# Patient Record
Sex: Female | Born: 1935 | Race: White | Hispanic: No | Smoking: Never smoker
Health system: Southern US, Community
[De-identification: ages and names within clinical notes are randomized; demographics above are authoritative.]

## PROBLEM LIST (undated history)

## (undated) DIAGNOSIS — T847XXD Infection and inflammatory reaction due to other internal orthopedic prosthetic devices, implants and grafts, subsequent encounter: Secondary | ICD-10-CM

## (undated) DIAGNOSIS — E669 Obesity, unspecified: Secondary | ICD-10-CM

## (undated) DIAGNOSIS — I219 Acute myocardial infarction, unspecified: Secondary | ICD-10-CM

## (undated) DIAGNOSIS — R278 Other lack of coordination: Secondary | ICD-10-CM

## (undated) DIAGNOSIS — M1990 Unspecified osteoarthritis, unspecified site: Secondary | ICD-10-CM

## (undated) DIAGNOSIS — F329 Major depressive disorder, single episode, unspecified: Secondary | ICD-10-CM

## (undated) DIAGNOSIS — I10 Essential (primary) hypertension: Secondary | ICD-10-CM

## (undated) DIAGNOSIS — E785 Hyperlipidemia, unspecified: Secondary | ICD-10-CM

## (undated) DIAGNOSIS — D649 Anemia, unspecified: Secondary | ICD-10-CM

## (undated) DIAGNOSIS — M6281 Muscle weakness (generalized): Secondary | ICD-10-CM

## (undated) DIAGNOSIS — Z9289 Personal history of other medical treatment: Secondary | ICD-10-CM

## (undated) DIAGNOSIS — R262 Difficulty in walking, not elsewhere classified: Secondary | ICD-10-CM

## (undated) DIAGNOSIS — E639 Nutritional deficiency, unspecified: Secondary | ICD-10-CM

## (undated) DIAGNOSIS — Z9181 History of falling: Secondary | ICD-10-CM

## (undated) DIAGNOSIS — I872 Venous insufficiency (chronic) (peripheral): Secondary | ICD-10-CM

## (undated) DIAGNOSIS — F32A Depression, unspecified: Secondary | ICD-10-CM

## (undated) DIAGNOSIS — K219 Gastro-esophageal reflux disease without esophagitis: Secondary | ICD-10-CM

## (undated) DIAGNOSIS — E611 Iron deficiency: Secondary | ICD-10-CM

## (undated) DIAGNOSIS — L97323 Non-pressure chronic ulcer of left ankle with necrosis of muscle: Secondary | ICD-10-CM

## (undated) DIAGNOSIS — S82852A Displaced trimalleolar fracture of left lower leg, initial encounter for closed fracture: Secondary | ICD-10-CM

## (undated) DIAGNOSIS — I4891 Unspecified atrial fibrillation: Secondary | ICD-10-CM

## (undated) HISTORY — DX: Major depressive disorder, single episode, unspecified: F32.9

## (undated) HISTORY — DX: Gastro-esophageal reflux disease without esophagitis: K21.9

## (undated) HISTORY — DX: Hyperlipidemia, unspecified: E78.5

## (undated) HISTORY — DX: Obesity, unspecified: E66.9

## (undated) HISTORY — DX: Depression, unspecified: F32.A

## (undated) HISTORY — PX: APPENDECTOMY: SHX54

## (undated) HISTORY — DX: Essential (primary) hypertension: I10

---

## 2004-03-09 ENCOUNTER — Inpatient Hospital Stay (HOSPITAL_COMMUNITY): Admission: EM | Admit: 2004-03-09 | Discharge: 2004-03-15 | Payer: Self-pay | Admitting: Emergency Medicine

## 2004-03-15 ENCOUNTER — Inpatient Hospital Stay
Admission: RE | Admit: 2004-03-15 | Discharge: 2004-03-21 | Payer: Self-pay | Admitting: Physical Medicine & Rehabilitation

## 2004-06-11 ENCOUNTER — Ambulatory Visit (HOSPITAL_COMMUNITY): Admission: RE | Admit: 2004-06-11 | Discharge: 2004-06-11 | Payer: Self-pay | Admitting: Unknown Physician Specialty

## 2004-06-22 ENCOUNTER — Ambulatory Visit: Payer: Self-pay | Admitting: Internal Medicine

## 2004-07-03 ENCOUNTER — Ambulatory Visit: Payer: Self-pay | Admitting: Internal Medicine

## 2011-12-24 DIAGNOSIS — I10 Essential (primary) hypertension: Secondary | ICD-10-CM | POA: Diagnosis not present

## 2011-12-24 DIAGNOSIS — M949 Disorder of cartilage, unspecified: Secondary | ICD-10-CM | POA: Diagnosis not present

## 2011-12-24 DIAGNOSIS — E785 Hyperlipidemia, unspecified: Secondary | ICD-10-CM | POA: Diagnosis not present

## 2011-12-24 DIAGNOSIS — M899 Disorder of bone, unspecified: Secondary | ICD-10-CM | POA: Diagnosis not present

## 2011-12-24 DIAGNOSIS — F329 Major depressive disorder, single episode, unspecified: Secondary | ICD-10-CM | POA: Diagnosis not present

## 2012-01-20 DIAGNOSIS — H353190 Nonexudative age-related macular degeneration, unspecified eye, stage unspecified: Secondary | ICD-10-CM | POA: Diagnosis not present

## 2012-06-30 DIAGNOSIS — E785 Hyperlipidemia, unspecified: Secondary | ICD-10-CM | POA: Diagnosis not present

## 2012-06-30 DIAGNOSIS — Z1331 Encounter for screening for depression: Secondary | ICD-10-CM | POA: Diagnosis not present

## 2012-06-30 DIAGNOSIS — I7389 Other specified peripheral vascular diseases: Secondary | ICD-10-CM | POA: Diagnosis not present

## 2012-06-30 DIAGNOSIS — M899 Disorder of bone, unspecified: Secondary | ICD-10-CM | POA: Diagnosis not present

## 2012-06-30 DIAGNOSIS — M949 Disorder of cartilage, unspecified: Secondary | ICD-10-CM | POA: Diagnosis not present

## 2012-06-30 DIAGNOSIS — Z23 Encounter for immunization: Secondary | ICD-10-CM | POA: Diagnosis not present

## 2012-06-30 DIAGNOSIS — I10 Essential (primary) hypertension: Secondary | ICD-10-CM | POA: Diagnosis not present

## 2012-08-31 DIAGNOSIS — L2089 Other atopic dermatitis: Secondary | ICD-10-CM | POA: Diagnosis not present

## 2012-08-31 DIAGNOSIS — B379 Candidiasis, unspecified: Secondary | ICD-10-CM | POA: Diagnosis not present

## 2012-09-01 DIAGNOSIS — L538 Other specified erythematous conditions: Secondary | ICD-10-CM | POA: Diagnosis not present

## 2012-10-30 DIAGNOSIS — H01029 Squamous blepharitis unspecified eye, unspecified eyelid: Secondary | ICD-10-CM | POA: Diagnosis not present

## 2012-12-11 DIAGNOSIS — L538 Other specified erythematous conditions: Secondary | ICD-10-CM | POA: Diagnosis not present

## 2012-12-28 DIAGNOSIS — F331 Major depressive disorder, recurrent, moderate: Secondary | ICD-10-CM | POA: Diagnosis not present

## 2012-12-28 DIAGNOSIS — K219 Gastro-esophageal reflux disease without esophagitis: Secondary | ICD-10-CM | POA: Diagnosis not present

## 2012-12-28 DIAGNOSIS — D649 Anemia, unspecified: Secondary | ICD-10-CM | POA: Diagnosis not present

## 2012-12-28 DIAGNOSIS — M949 Disorder of cartilage, unspecified: Secondary | ICD-10-CM | POA: Diagnosis not present

## 2012-12-28 DIAGNOSIS — I7389 Other specified peripheral vascular diseases: Secondary | ICD-10-CM | POA: Diagnosis not present

## 2012-12-28 DIAGNOSIS — M899 Disorder of bone, unspecified: Secondary | ICD-10-CM | POA: Diagnosis not present

## 2012-12-28 DIAGNOSIS — E785 Hyperlipidemia, unspecified: Secondary | ICD-10-CM | POA: Diagnosis not present

## 2012-12-28 DIAGNOSIS — I10 Essential (primary) hypertension: Secondary | ICD-10-CM | POA: Diagnosis not present

## 2012-12-28 DIAGNOSIS — M549 Dorsalgia, unspecified: Secondary | ICD-10-CM | POA: Diagnosis not present

## 2013-01-25 DIAGNOSIS — H353190 Nonexudative age-related macular degeneration, unspecified eye, stage unspecified: Secondary | ICD-10-CM | POA: Diagnosis not present

## 2013-06-28 DIAGNOSIS — D649 Anemia, unspecified: Secondary | ICD-10-CM | POA: Diagnosis not present

## 2013-06-28 DIAGNOSIS — M549 Dorsalgia, unspecified: Secondary | ICD-10-CM | POA: Diagnosis not present

## 2013-06-28 DIAGNOSIS — M899 Disorder of bone, unspecified: Secondary | ICD-10-CM | POA: Diagnosis not present

## 2013-06-28 DIAGNOSIS — R809 Proteinuria, unspecified: Secondary | ICD-10-CM | POA: Diagnosis not present

## 2013-06-28 DIAGNOSIS — I10 Essential (primary) hypertension: Secondary | ICD-10-CM | POA: Diagnosis not present

## 2013-06-28 DIAGNOSIS — F331 Major depressive disorder, recurrent, moderate: Secondary | ICD-10-CM | POA: Diagnosis not present

## 2013-06-28 DIAGNOSIS — R82998 Other abnormal findings in urine: Secondary | ICD-10-CM | POA: Diagnosis not present

## 2013-06-28 DIAGNOSIS — Z79899 Other long term (current) drug therapy: Secondary | ICD-10-CM | POA: Diagnosis not present

## 2013-06-28 DIAGNOSIS — E785 Hyperlipidemia, unspecified: Secondary | ICD-10-CM | POA: Diagnosis not present

## 2013-06-28 DIAGNOSIS — I7389 Other specified peripheral vascular diseases: Secondary | ICD-10-CM | POA: Diagnosis not present

## 2013-06-28 DIAGNOSIS — R300 Dysuria: Secondary | ICD-10-CM | POA: Diagnosis not present

## 2013-06-28 DIAGNOSIS — Z23 Encounter for immunization: Secondary | ICD-10-CM | POA: Diagnosis not present

## 2013-09-06 DIAGNOSIS — H353190 Nonexudative age-related macular degeneration, unspecified eye, stage unspecified: Secondary | ICD-10-CM | POA: Diagnosis not present

## 2013-12-27 DIAGNOSIS — K219 Gastro-esophageal reflux disease without esophagitis: Secondary | ICD-10-CM | POA: Diagnosis not present

## 2013-12-27 DIAGNOSIS — D649 Anemia, unspecified: Secondary | ICD-10-CM | POA: Diagnosis not present

## 2013-12-27 DIAGNOSIS — M949 Disorder of cartilage, unspecified: Secondary | ICD-10-CM | POA: Diagnosis not present

## 2013-12-27 DIAGNOSIS — M549 Dorsalgia, unspecified: Secondary | ICD-10-CM | POA: Diagnosis not present

## 2013-12-27 DIAGNOSIS — E785 Hyperlipidemia, unspecified: Secondary | ICD-10-CM | POA: Diagnosis not present

## 2013-12-27 DIAGNOSIS — I7389 Other specified peripheral vascular diseases: Secondary | ICD-10-CM | POA: Diagnosis not present

## 2013-12-27 DIAGNOSIS — F331 Major depressive disorder, recurrent, moderate: Secondary | ICD-10-CM | POA: Diagnosis not present

## 2013-12-27 DIAGNOSIS — I10 Essential (primary) hypertension: Secondary | ICD-10-CM | POA: Diagnosis not present

## 2013-12-27 DIAGNOSIS — M899 Disorder of bone, unspecified: Secondary | ICD-10-CM | POA: Diagnosis not present

## 2014-06-12 ENCOUNTER — Emergency Department (HOSPITAL_COMMUNITY): Payer: Medicare Other

## 2014-06-12 ENCOUNTER — Encounter (HOSPITAL_COMMUNITY): Payer: Self-pay | Admitting: Emergency Medicine

## 2014-06-12 ENCOUNTER — Other Ambulatory Visit: Payer: Self-pay | Admitting: Physician Assistant

## 2014-06-12 ENCOUNTER — Inpatient Hospital Stay (HOSPITAL_COMMUNITY)
Admission: EM | Admit: 2014-06-12 | Discharge: 2014-06-16 | DRG: 493 | Disposition: A | Discharge status: Skilled Nursing Facility | Payer: Medicare Other | Attending: Orthopedic Surgery | Admitting: Orthopedic Surgery

## 2014-06-12 DIAGNOSIS — W19XXXA Unspecified fall, initial encounter: Secondary | ICD-10-CM

## 2014-06-12 DIAGNOSIS — M21271 Flexion deformity, right ankle and toes: Secondary | ICD-10-CM | POA: Diagnosis not present

## 2014-06-12 DIAGNOSIS — Z23 Encounter for immunization: Secondary | ICD-10-CM | POA: Diagnosis not present

## 2014-06-12 DIAGNOSIS — R262 Difficulty in walking, not elsewhere classified: Secondary | ICD-10-CM | POA: Diagnosis not present

## 2014-06-12 DIAGNOSIS — S82844D Nondisplaced bimalleolar fracture of right lower leg, subsequent encounter for closed fracture with routine healing: Secondary | ICD-10-CM | POA: Diagnosis not present

## 2014-06-12 DIAGNOSIS — Z9181 History of falling: Secondary | ICD-10-CM | POA: Diagnosis not present

## 2014-06-12 DIAGNOSIS — Z01818 Encounter for other preprocedural examination: Secondary | ICD-10-CM

## 2014-06-12 DIAGNOSIS — S8251XB Displaced fracture of medial malleolus of right tibia, initial encounter for open fracture type I or II: Secondary | ICD-10-CM | POA: Diagnosis not present

## 2014-06-12 DIAGNOSIS — S82891A Other fracture of right lower leg, initial encounter for closed fracture: Secondary | ICD-10-CM | POA: Diagnosis not present

## 2014-06-12 DIAGNOSIS — Z01812 Encounter for preprocedural laboratory examination: Secondary | ICD-10-CM | POA: Diagnosis not present

## 2014-06-12 DIAGNOSIS — J984 Other disorders of lung: Secondary | ICD-10-CM | POA: Diagnosis not present

## 2014-06-12 DIAGNOSIS — D62 Acute posthemorrhagic anemia: Secondary | ICD-10-CM | POA: Diagnosis not present

## 2014-06-12 DIAGNOSIS — N3000 Acute cystitis without hematuria: Secondary | ICD-10-CM | POA: Diagnosis not present

## 2014-06-12 DIAGNOSIS — M6281 Muscle weakness (generalized): Secondary | ICD-10-CM | POA: Diagnosis not present

## 2014-06-12 DIAGNOSIS — S82899A Other fracture of unspecified lower leg, initial encounter for closed fracture: Secondary | ICD-10-CM | POA: Diagnosis present

## 2014-06-12 DIAGNOSIS — S91001A Unspecified open wound, right ankle, initial encounter: Secondary | ICD-10-CM | POA: Diagnosis not present

## 2014-06-12 DIAGNOSIS — Z01810 Encounter for preprocedural cardiovascular examination: Secondary | ICD-10-CM | POA: Diagnosis not present

## 2014-06-12 DIAGNOSIS — M542 Cervicalgia: Secondary | ICD-10-CM | POA: Diagnosis not present

## 2014-06-12 DIAGNOSIS — S82841B Displaced bimalleolar fracture of right lower leg, initial encounter for open fracture type I or II: Principal | ICD-10-CM | POA: Diagnosis present

## 2014-06-12 DIAGNOSIS — Z79899 Other long term (current) drug therapy: Secondary | ICD-10-CM

## 2014-06-12 DIAGNOSIS — G8918 Other acute postprocedural pain: Secondary | ICD-10-CM | POA: Diagnosis not present

## 2014-06-12 DIAGNOSIS — Z7982 Long term (current) use of aspirin: Secondary | ICD-10-CM | POA: Diagnosis not present

## 2014-06-12 DIAGNOSIS — S8290XS Unspecified fracture of unspecified lower leg, sequela: Secondary | ICD-10-CM | POA: Diagnosis not present

## 2014-06-12 DIAGNOSIS — I10 Essential (primary) hypertension: Secondary | ICD-10-CM | POA: Diagnosis not present

## 2014-06-12 DIAGNOSIS — T148 Other injury of unspecified body region: Secondary | ICD-10-CM | POA: Diagnosis not present

## 2014-06-12 DIAGNOSIS — S46001A Unspecified injury of muscle(s) and tendon(s) of the rotator cuff of right shoulder, initial encounter: Secondary | ICD-10-CM | POA: Diagnosis not present

## 2014-06-12 DIAGNOSIS — J188 Other pneumonia, unspecified organism: Secondary | ICD-10-CM | POA: Diagnosis not present

## 2014-06-12 DIAGNOSIS — W1830XA Fall on same level, unspecified, initial encounter: Secondary | ICD-10-CM | POA: Diagnosis present

## 2014-06-12 DIAGNOSIS — R9431 Abnormal electrocardiogram [ECG] [EKG]: Secondary | ICD-10-CM | POA: Diagnosis not present

## 2014-06-12 DIAGNOSIS — R278 Other lack of coordination: Secondary | ICD-10-CM | POA: Diagnosis not present

## 2014-06-12 DIAGNOSIS — N390 Urinary tract infection, site not specified: Secondary | ICD-10-CM | POA: Diagnosis not present

## 2014-06-12 DIAGNOSIS — S82841A Displaced bimalleolar fracture of right lower leg, initial encounter for closed fracture: Secondary | ICD-10-CM | POA: Diagnosis not present

## 2014-06-12 LAB — CBC WITH DIFFERENTIAL/PLATELET
Basophils Absolute: 0 10*3/uL (ref 0.0–0.1)
Basophils Absolute: 0 10*3/uL (ref 0.0–0.1)
Basophils Relative: 0 % (ref 0–1)
Basophils Relative: 0 % (ref 0–1)
Eosinophils Absolute: 0 10*3/uL (ref 0.0–0.7)
Eosinophils Absolute: 0 10*3/uL (ref 0.0–0.7)
Eosinophils Relative: 0 % (ref 0–5)
Eosinophils Relative: 0 % (ref 0–5)
HCT: 35.4 % — ABNORMAL LOW (ref 36.0–46.0)
HCT: 30.5 % — ABNORMAL LOW (ref 36.0–46.0)
Hemoglobin: 11.5 g/dL — ABNORMAL LOW (ref 12.0–15.0)
Hemoglobin: 10.1 g/dL — ABNORMAL LOW (ref 12.0–15.0)
Lymphocytes Relative: 26 % (ref 12–46)
Lymphocytes Relative: 35 % (ref 12–46)
Lymphs Abs: 4.8 10*3/uL — ABNORMAL HIGH (ref 0.7–4.0)
Lymphs Abs: 4.3 10*3/uL — ABNORMAL HIGH (ref 0.7–4.0)
MCH: 30.9 pg (ref 26.0–34.0)
MCH: 30.8 pg (ref 26.0–34.0)
MCHC: 32.5 g/dL (ref 30.0–36.0)
MCHC: 33.1 g/dL (ref 30.0–36.0)
MCV: 95.2 fL (ref 78.0–100.0)
MCV: 93 fL (ref 78.0–100.0)
Monocytes Absolute: 0.6 10*3/uL (ref 0.1–1.0)
Monocytes Absolute: 0.9 10*3/uL (ref 0.1–1.0)
Monocytes Relative: 4 % (ref 3–12)
Monocytes Relative: 7 % (ref 3–12)
Neutro Abs: 12.6 10*3/uL — ABNORMAL HIGH (ref 1.7–7.7)
Neutro Abs: 7 10*3/uL (ref 1.7–7.7)
Neutrophils Relative %: 70 % (ref 43–77)
Neutrophils Relative %: 58 % (ref 43–77)
Platelets: 217 10*3/uL (ref 150–400)
Platelets: 207 10*3/uL (ref 150–400)
RBC: 3.72 MIL/uL — ABNORMAL LOW (ref 3.87–5.11)
RBC: 3.28 MIL/uL — ABNORMAL LOW (ref 3.87–5.11)
RDW: 13.1 % (ref 11.5–15.5)
RDW: 13.3 % (ref 11.5–15.5)
WBC: 18 10*3/uL — ABNORMAL HIGH (ref 4.0–10.5)
WBC: 12.2 10*3/uL — ABNORMAL HIGH (ref 4.0–10.5)

## 2014-06-12 LAB — COMPREHENSIVE METABOLIC PANEL
ALT: 15 U/L (ref 0–35)
AST: 31 U/L (ref 0–37)
Albumin: 3.4 g/dL — ABNORMAL LOW (ref 3.5–5.2)
Alkaline Phosphatase: 72 U/L (ref 39–117)
Anion gap: 13 (ref 5–15)
BUN: 30 mg/dL — ABNORMAL HIGH (ref 6–23)
CO2: 24 mEq/L (ref 19–32)
Calcium: 8.8 mg/dL (ref 8.4–10.5)
Chloride: 102 mEq/L (ref 96–112)
Creatinine, Ser: 1.01 mg/dL (ref 0.50–1.10)
GFR calc Af Amer: 57 mL/min — ABNORMAL LOW (ref >90)
GFR calc non Af Amer: 49 mL/min — ABNORMAL LOW (ref >90)
Glucose, Bld: 128 mg/dL — ABNORMAL HIGH (ref 70–99)
Potassium: 4.2 mEq/L (ref 3.7–5.3)
Sodium: 139 mEq/L (ref 137–147)
Total Bilirubin: 0.3 mg/dL (ref 0.3–1.2)
Total Protein: 6.3 g/dL (ref 6.0–8.3)

## 2014-06-12 LAB — TYPE AND SCREEN
ABO/RH(D): B POS
Antibody Screen: NEGATIVE

## 2014-06-12 LAB — BASIC METABOLIC PANEL
Anion gap: 15 (ref 5–15)
BUN: 35 mg/dL — ABNORMAL HIGH (ref 6–23)
CO2: 23 mEq/L (ref 19–32)
Calcium: 9.5 mg/dL (ref 8.4–10.5)
Chloride: 102 mEq/L (ref 96–112)
Creatinine, Ser: 1.09 mg/dL (ref 0.50–1.10)
GFR calc Af Amer: 52 mL/min — ABNORMAL LOW (ref >90)
GFR calc non Af Amer: 45 mL/min — ABNORMAL LOW (ref >90)
Glucose, Bld: 142 mg/dL — ABNORMAL HIGH (ref 70–99)
Potassium: 4.2 mEq/L (ref 3.7–5.3)
Sodium: 140 mEq/L (ref 137–147)

## 2014-06-12 LAB — PROTIME-INR
INR: 0.99 (ref 0.00–1.49)
INR: 1.11 (ref 0.00–1.49)
Prothrombin Time: 13.1 seconds (ref 11.6–15.2)
Prothrombin Time: 14.4 seconds (ref 11.6–15.2)

## 2014-06-12 LAB — APTT: aPTT: 27 seconds (ref 24–37)

## 2014-06-12 MED ORDER — ONDANSETRON HCL 4 MG/2ML IJ SOLN
4.0000 mg | Freq: Four times a day (QID) | INTRAMUSCULAR | Status: DC | PRN
  Administered 2014-06-12: 4 mg via INTRAVENOUS
  Filled 2014-06-12: qty 2

## 2014-06-12 MED ORDER — CEFAZOLIN SODIUM 1-5 GM-% IV SOLN
1.0000 g | Freq: Three times a day (TID) | INTRAVENOUS | Status: DC
Start: 2014-06-12 — End: 2014-06-13
  Administered 2014-06-12 – 2014-06-13 (×2): 1 g via INTRAVENOUS
  Filled 2014-06-12 (×5): qty 50

## 2014-06-12 MED ORDER — METOCLOPRAMIDE HCL 10 MG PO TABS
10.0000 mg | ORAL_TABLET | Freq: Four times a day (QID) | ORAL | Status: DC | PRN
Start: 2014-06-12 — End: 2014-06-13

## 2014-06-12 MED ORDER — METOCLOPRAMIDE HCL 10 MG PO TABS
10.0000 mg | ORAL_TABLET | Freq: Four times a day (QID) | ORAL | Status: DC | PRN
Start: 2014-06-12 — End: 2014-06-12

## 2014-06-12 MED ORDER — CEFAZOLIN SODIUM 1-5 GM-% IV SOLN
1.0000 g | Freq: Once | INTRAVENOUS | Status: AC
  Administered 2014-06-12: 1 g via INTRAVENOUS
  Filled 2014-06-12: qty 50

## 2014-06-12 MED ORDER — HYDROCODONE-ACETAMINOPHEN 5-325 MG PO TABS
1.0000 | ORAL_TABLET | ORAL | Status: DC | PRN
  Administered 2014-06-12: 2 via ORAL
  Administered 2014-06-13: 1 via ORAL
  Filled 2014-06-12: qty 1
  Filled 2014-06-12: qty 2

## 2014-06-12 MED ORDER — PROPOFOL 10 MG/ML IV BOLUS
0.5000 mg/kg | Freq: Once | INTRAVENOUS | Status: AC
  Administered 2014-06-12: 60 mg via INTRAVENOUS
  Filled 2014-06-12: qty 1

## 2014-06-12 MED ORDER — SODIUM CHLORIDE 0.9 % IV SOLN: INTRAVENOUS | Status: DC

## 2014-06-12 MED ORDER — HYDROMORPHONE HCL 1 MG/ML IJ SOLN
1.0000 mg | INTRAMUSCULAR | Status: DC | PRN
  Administered 2014-06-12 – 2014-06-13 (×2): 1 mg via INTRAVENOUS
  Filled 2014-06-12 (×2): qty 1

## 2014-06-12 MED ORDER — CHLORHEXIDINE GLUCONATE 4 % EX LIQD
60.0000 mL | Freq: Once | CUTANEOUS | Status: AC
  Filled 2014-06-12: qty 60

## 2014-06-12 MED ORDER — CHLORHEXIDINE GLUCONATE 4 % EX LIQD
60.0000 mL | Freq: Once | CUTANEOUS | Status: AC
  Administered 2014-06-13: 4 via TOPICAL
  Filled 2014-06-12: qty 60

## 2014-06-12 MED ORDER — METOCLOPRAMIDE HCL 10 MG PO TABS: 10.0000 mg | ORAL_TABLET | Freq: Four times a day (QID) | ORAL | Status: DC | PRN

## 2014-06-12 MED ORDER — CEFAZOLIN SODIUM-DEXTROSE 2-3 GM-% IV SOLR
2.0000 g | INTRAVENOUS | Status: AC
  Administered 2014-06-13: 2 g via INTRAVENOUS
  Filled 2014-06-12: qty 50

## 2014-06-12 MED ORDER — TETANUS-DIPHTH-ACELL PERTUSSIS 5-2.5-18.5 LF-MCG/0.5 IM SUSP
0.5000 mL | Freq: Once | INTRAMUSCULAR | Status: AC
  Administered 2014-06-12: 0.5 mL via INTRAMUSCULAR
  Filled 2014-06-12: qty 0.5

## 2014-06-12 MED ORDER — METOCLOPRAMIDE HCL 5 MG/ML IJ SOLN: 10.0000 mg | Freq: Four times a day (QID) | INTRAMUSCULAR | Status: DC

## 2014-06-12 MED ORDER — ONDANSETRON HCL 4 MG PO TABS: 4.0000 mg | ORAL_TABLET | Freq: Four times a day (QID) | ORAL | Status: DC | PRN

## 2014-06-12 MED ORDER — LACTATED RINGERS IV SOLN
INTRAVENOUS | Status: DC
  Administered 2014-06-12 – 2014-06-13 (×3): via INTRAVENOUS

## 2014-06-12 NOTE — ED Provider Notes (Signed)
CSN: [id number]     Arrival date & time 06/12/14  1133 History   First MD Initiated Contact with Patient 06/12/14 1206     Chief Complaint  Patient presents with  . Leg Injury    R ankle fracture     (Consider location/radiation/quality/duration/timing/severity/associated sxs/prior Treatment) HPI Comments: Patient presents to the ER for evaluation of right ankle injury. Patient reports that she stood up too fast to try to go to the bathroom, bumped into the wall and fell. She reports that she fell into a seated position with her leg bent under her. Patient complaining of right ankle pain and injury. She is brought to the ER by EMS who report obvious deformity of the right ankle.patient has been administered analgesia by EMS, reports only mild pain currently. Patient denies hitting her head. There was no loss of consciousness. Patient reports that she did bump her right arm on the wall and has a bruise there, but minimal pain. She denies chest pain, shortness of breath. No hip pain.   History reviewed. No pertinent past medical history. Past Surgical History  Procedure Laterality Date  . Appendectomy     No family history on file. History  Substance Use Topics  . Smoking status: Never Smoker   . Smokeless tobacco: Not on file  . Alcohol Use: No   OB History    No data available     Review of Systems  Musculoskeletal: Positive for arthralgias (right ankle). Negative for back pain and neck pain.  Neurological: Negative for syncope and headaches.  All other systems reviewed and are negative.     Allergies  Review of patient's allergies indicates no known allergies.  Home Medications   Prior to Admission medications   Medication Sig Start Date End Date Taking? Authorizing Provider  amLODipine (NORVASC) 5 MG tablet Take 5 mg by mouth daily.   Yes Historical Provider, MD  atenolol (TENORMIN) 50 MG tablet Take 50 mg by mouth daily.   Yes Historical Provider, MD  citalopram  (CELEXA) 20 MG tablet Take 20 mg by mouth daily.   Yes Historical Provider, MD  esomeprazole (NEXIUM) 40 MG capsule Take 40 mg by mouth daily at 12 noon.   Yes Historical Provider, MD  ezetimibe-simvastatin (VYTORIN) 10-40 MG per tablet Take 1 tablet by mouth at bedtime.   Yes Historical Provider, MD  losartan-hydrochlorothiazide (HYZAAR) 100-25 MG per tablet Take 1 tablet by mouth daily.   Yes Historical Provider, MD  aspirin EC 325 MG tablet Take 1 tablet (325 mg total) by mouth daily. 06/13/14   M. Odelia Gage, PA-C  bisacodyl (DULCOLAX) 5 MG EC tablet Take 1 tablet (5 mg total) by mouth daily as needed for moderate constipation. 06/13/14   M. Odelia Gage, PA-C  HYDROcodone-acetaminophen (NORCO) 7.5-325 MG per tablet Take 1-2 tablets by mouth every 4 (four) hours as needed for moderate pain. 06/13/14   M. Odelia Gage, PA-C  ondansetron (ZOFRAN) 4 MG tablet Take 1 tablet (4 mg total) by mouth every 8 (eight) hours as needed for nausea or vomiting. 06/13/14   M. Odelia Gage, PA-C   BP 152/58 mmHg  Pulse 68  Temp(Src) 97.9 F (36.6 C) (Oral)  Resp 13  Ht 5\' 6"  (1.676 m)  Wt 222 lb 3.6 oz (100.8 kg)  BMI 35.88 kg/m2  SpO2 93% Physical Exam  Constitutional: She is oriented to person, place, and time. She appears well-developed and well-nourished. No distress.  HENT:  Head: Normocephalic and atraumatic.  Right  Ear: Hearing normal.  Left Ear: Hearing normal.  Nose: Nose normal.  Mouth/Throat: Oropharynx is clear and moist and mucous membranes are normal.  Eyes: Conjunctivae and EOM are normal. Pupils are equal, round, and reactive to light.  Neck: Normal range of motion. Neck supple.  Cardiovascular: Regular rhythm, S1 normal and S2 normal.  Exam reveals no gallop and no friction rub.   No murmur heard. Pulmonary/Chest: Effort normal and breath sounds normal. No respiratory distress. She exhibits no tenderness.  Abdominal: Soft. Normal appearance and bowel sounds are normal. There is  no hepatosplenomegaly. There is no tenderness. There is no rebound, no guarding, no tenderness at McBurney's point and negative Murphy's sign. No hernia.  Musculoskeletal: Normal range of motion.       Right ankle: She exhibits deformity (lateral displaclement of foot) and laceration (medial malleolus). Tenderness. Lateral malleolus and medial malleolus tenderness found. No proximal fibula tenderness found.       Arms: Neurological: She is alert and oriented to person, place, and time. She has normal strength. No cranial nerve deficit or sensory deficit. Coordination normal. GCS eye subscore is 4. GCS verbal subscore is 5. GCS motor subscore is 6.  Skin: Skin is warm, dry and intact. Ecchymosis (dorsal foot, posterior right upper arm) noted. No rash noted. No cyanosis.  Psychiatric: She has a normal mood and affect. Her speech is normal and behavior is normal. Thought content normal.    ED Course  ORTHOPEDIC INJURY TREATMENT Date/Time: 06/12/2014 12:50 PM Performed by: Gilda CreasePOLLINA, Jahid Weida J. Authorized by: Gilda CreasePOLLINA, Ruweyda Macknight J. Consent: Verbal consent obtained. Written consent obtained. Risks and benefits: risks, benefits and alternatives were discussed Consent given by: patient Patient understanding: patient states understanding of the procedure being performed Patient consent: the patient's understanding of the procedure matches consent given Procedure consent: procedure consent matches procedure scheduled Relevant documents: relevant documents present and verified Test results: test results available and properly labeled Site marked: the operative site was marked Patient identity confirmed: verbally with patient, arm band and hospital-assigned identification number Time out: Immediately prior to procedure a "time out" was called to verify the correct patient, procedure, equipment, support staff and site/side marked as required. Injury location: ankle Location details: right  ankle Injury type: fracture-dislocation Pre-procedure neurovascular assessment: neurovascularly intact Pre-procedure distal perfusion: normal Pre-procedure neurological function: normal Local anesthesia used: no Patient sedated: yes Sedatives: propofol Sedation start date/time: 06/12/2014 12:36 PM Manipulation performed: yes Skeletal traction used: yes Reduction successful: yes Immobilization: splint Splint type: ankle stirrup and short leg Supplies used: Ortho-Glass Post-procedure neurovascular assessment: post-procedure neurovascularly intact Post-procedure distal perfusion: normal Post-procedure neurological function: normal Patient tolerance: Patient tolerated the procedure well with no immediate complications   (including critical care time) Labs Review Labs Reviewed  CBC WITH DIFFERENTIAL - Abnormal; Notable for the following:    WBC 18.0 (*)    RBC 3.72 (*)    Hemoglobin 11.5 (*)    HCT 35.4 (*)    Neutro Abs 12.6 (*)    Lymphs Abs 4.8 (*)    All other components within normal limits  BASIC METABOLIC PANEL - Abnormal; Notable for the following:    Glucose, Bld 142 (*)    BUN 35 (*)    GFR calc non Af Amer 45 (*)    GFR calc Af Amer 52 (*)    All other components within normal limits  CBC WITH DIFFERENTIAL - Abnormal; Notable for the following:    WBC 12.2 (*)    RBC 3.28 (*)  Hemoglobin 10.1 (*)    HCT 30.5 (*)    Lymphs Abs 4.3 (*)    All other components within normal limits  COMPREHENSIVE METABOLIC PANEL - Abnormal; Notable for the following:    Glucose, Bld 128 (*)    BUN 30 (*)    Albumin 3.4 (*)    GFR calc non Af Amer 49 (*)    GFR calc Af Amer 57 (*)    All other components within normal limits  URINALYSIS, ROUTINE W REFLEX MICROSCOPIC - Abnormal; Notable for the following:    APPearance CLOUDY (*)    Hgb urine dipstick MODERATE (*)    Nitrite POSITIVE (*)    Leukocytes, UA LARGE (*)    All other components within normal limits  URINE  MICROSCOPIC-ADD ON - Abnormal; Notable for the following:    Bacteria, UA FEW (*)    All other components within normal limits  MRSA PCR SCREENING  URINE CULTURE  PROTIME-INR  PROTIME-INR  APTT  PROTIME-INR  TYPE AND SCREEN  ABO/RH    Imaging Review Dg Ankle 2 Views Right  06/12/2014   CLINICAL DATA:  Ankle injury  EXAM: RIGHT ANKLE - 2 VIEW  COMPARISON:  None.  FINDINGS: The digits and metatarsals are intact. Tarsal bones are grossly intact. A complex ankle fracture with distal location is noted.  IMPRESSION: Complex ankle fracture with dislocation.  No obvious foot fracture.   Electronically Signed   By: Maryclare Bean M.D.   On: 06/12/2014 14:28   Dg Chest Port 1 View  06/12/2014   CLINICAL DATA:  Fall.  Initial evaluation.  EXAM: PORTABLE CHEST - 1 VIEW  COMPARISON:  03/10/2004.  FINDINGS: Mediastinum and hilar structures normal. Cardiomegaly with normal pulmonary vascularity. Left base pleural parenchymal thickening consistent with scarring. No infiltrate. No pleural effusion. No pneumothorax. No acute bony abnormality identified.  IMPRESSION: 1. Pleural parenchymal scarring left lung base. 2. Cardiomegaly, no CHF.  No acute pulmonary disease.   Electronically Signed   By: Maisie Fus  Register   On: 06/12/2014 14:27   Dg Foot 2 Views Right  06/12/2014   CLINICAL DATA:  Post reduction ankle.  EXAM: RIGHT FOOT - 2 VIEW  COMPARISON:  None.  FINDINGS: Patient is in a splint. Bimalleolar fractures are present. Fractures are laterally displaced. Tibiotalar disruption is present.  IMPRESSION: Bimalleolar fractures with prominent displacement. Tibiotalar joint disruption. Patient is in a splint.   Electronically Signed   By: Maisie Fus  Register   On: 06/12/2014 14:29     EKG Interpretation   Date/Time:  Sunday June 12 2014 12:29:19 EST Ventricular Rate:  75 PR Interval:  201 QRS Duration: 100 QT Interval:  432 QTC Calculation: 482 R Axis:   -26 Text Interpretation:  Sinus rhythm Probable left  ventricular hypertrophy  Anterior Q waves, possibly due to LVH No previous tracing Confirmed by  Gabrelle Roca  MD, August Gosser [phone]) on 06/12/2014 2:26:40 PM      MDM   Final diagnoses:  Bimalleolar fracture, right, open type I or II, initial encounter    Patient presented to the ER for evaluation of isolated ankle injury after a fall. Evaluation of the patient revealed evidence of open fracture of the right ankle. Patient had an approximately 1.5 cm opening on the medial malleolus region of the ankle with protruding bone. It was felt that urgent reduction was required. Patient was neurovascularly intact. Reduction was performed.  Case was discussed with Dr. Eulah Pont, on call for orthopedics. He has come in  and evaluated the patient in the ER. He will admit her to the hospital for surgical intervention tomorrow.  Gilda Creasehristopher J. Jadon Harbaugh, MD 06/13/14 1550

## 2014-06-12 NOTE — Consult Note (Signed)
ORTHOPAEDIC CONSULTATION  REQUESTING PHYSICIAN: Ninetta Lights, MD  Chief Complaint:  "I hurt my ankle this morning as I was getting out of bed to use the restroom"  HPI: Ana Franco is a 80 y.o. female  who states that she was urgently getting out of bed this am to run to the restroom when she lost her footing and fell flat on her buttocks.  At that point she remembers her ankle rolling inwards.  She did not note immediate pain at the time, but this did become bothersome a few hours later.  She has not been able to ambulate since the fall.  She presented to the [facility] ED this afternoon via ambulance.  She does have a hx of right ankle fracture nearly 10 years ago as well as a right ankle sprain 3 years ago, neither of which required operative intervention.  Of note, she is a very independent female who lives at home alone.  Prior to the fall, she was able to ambulate without assistance.     History reviewed. No pertinent past medical history. Past Surgical History  Procedure Laterality Date  . Appendectomy     History   Social History  . Marital Status: Widowed    Spouse Name: N/A    Number of Children: N/A  . Years of Education: N/A   Social History Main Topics  . Smoking status: Never Smoker   . Smokeless tobacco: None  . Alcohol Use: No  . Drug Use: No  . Sexual Activity: None   Other Topics Concern  . None   Social History Narrative  . None   No family history on file. No Known Allergies Prior to Admission medications   Medication Sig Start Date End Date Taking? Authorizing Provider  amLODipine (NORVASC) 5 MG tablet Take 5 mg by mouth daily.   Yes Historical Provider, MD  atenolol (TENORMIN) 50 MG tablet Take 50 mg by mouth daily.   Yes Historical Provider, MD  citalopram (CELEXA) 20 MG tablet Take 20 mg by mouth daily.   Yes Historical Provider, MD  esomeprazole (NEXIUM) 40 MG capsule Take 40 mg by mouth daily at 12 noon.   Yes Historical Provider, MD   ezetimibe-simvastatin (VYTORIN) 10-40 MG per tablet Take 1 tablet by mouth at bedtime.   Yes Historical Provider, MD  losartan-hydrochlorothiazide (HYZAAR) 100-25 MG per tablet Take 1 tablet by mouth daily.   Yes Historical Provider, MD   Dg Ankle 2 Views Right  06/12/2014   CLINICAL DATA:  Ankle injury  EXAM: RIGHT ANKLE - 2 VIEW  COMPARISON:  None.  FINDINGS: The digits and metatarsals are intact. Tarsal bones are grossly intact. A complex ankle fracture with distal location is noted.  IMPRESSION: Complex ankle fracture with dislocation.  No obvious foot fracture.   Electronically Signed   By: Maryclare Bean M.D.   On: 06/12/2014 14:28   Dg Chest Port 1 View  06/12/2014   CLINICAL DATA:  Fall.  Initial evaluation.  EXAM: PORTABLE CHEST - 1 VIEW  COMPARISON:  03/10/2004.  FINDINGS: Mediastinum and hilar structures normal. Cardiomegaly with normal pulmonary vascularity. Left base pleural parenchymal thickening consistent with scarring. No infiltrate. No pleural effusion. No pneumothorax. No acute bony abnormality identified.  IMPRESSION: 1. Pleural parenchymal scarring left lung base. 2. Cardiomegaly, no CHF.  No acute pulmonary disease.   Electronically Signed   By: Marcello Moores  Register   On: 06/12/2014 14:27   Dg Foot 2 Views Right  06/12/2014   CLINICAL DATA:  Post reduction ankle.  EXAM: RIGHT FOOT - 2 VIEW  COMPARISON:  None.  FINDINGS: Patient is in a splint. Bimalleolar fractures are present. Fractures are laterally displaced. Tibiotalar disruption is present.  IMPRESSION: Bimalleolar fractures with prominent displacement. Tibiotalar joint disruption. Patient is in a splint.   Electronically Signed   By: Marcello Moores  Register   On: 06/12/2014 14:29    Positive ROS: All other systems have been reviewed and were otherwise negative with the exception of those mentioned in the HPI and as above.  Labs cbc  Recent Labs  06/12/14 1234  WBC 18.0*  HGB 11.5*  HCT 35.4*  PLT 217    Labs inflam No results  for input(s): CRP in the last 72 hours.  Invalid input(s): ESR  Labs coag  Recent Labs  06/12/14 1234 06/12/14 1359  INR >10.00* 0.99     Recent Labs  06/12/14 1234  NA 140  K 4.2  CL 102  CO2 23  GLUCOSE 142*  BUN 35*  CREATININE 1.09  CALCIUM 9.5    Physical Exam: Filed Vitals:   06/12/14 1300  BP: 145/61  Pulse: 80  Resp: 17   General: Alert, no acute distress Cardiovascular: No pedal edema Respiratory: No cyanosis, no use of accessory musculature GI: No organomegaly, abdomen is soft and non-tender Skin: 1 cm laceration to the medial aspect of the ankle Neurologic: Sensation intact distally Psychiatric: Patient is competent for consent with normal mood and affect Lymphatic: No axillary or cervical lymphadenopathy  MUSCULOSKELETAL:  RLE Short leg splint in place Neurovascularly intact Good sensation throughout  Other extremities are atraumatic with painless ROM and NVI.  Assessment: Type I open bimalleolar fracture right ankle 1 cm transverse laceration medial aspect of right ankle  Plan: Transfer to [facility] today (5N) Plan for ORIF bimal fxr right ankle tomorrow NPO after midnight tonight NWB RLE No chemical ppx prior to sx.  Will Add chemical ppx post-op   Larae Grooms, PA-C Cell [phone]   06/12/2014 3:01 PM

## 2014-06-12 NOTE — ED Notes (Signed)
She remains in no distress and is very happy to receive a grilled cheese sandwich.  I have just given phone report to Hansel StarlingAdrienne, RN on [facility].  Pt. Is going to their bed #30.

## 2014-06-12 NOTE — ED Notes (Signed)
Pt states she was standing from chair, slid to floor, c/o R ankle pain, open fracture noted, pt with pain 4/10 to R ankle, medicated in EMS, bruising to foot and ankle.

## 2014-06-12 NOTE — ED Notes (Signed)
CMS remains intact with all toes bilat. Her fiberglass splint remains in place lower right leg/foot.  She is comfortable and remarkably chipper given her circumstance.

## 2014-06-12 NOTE — ED Notes (Addendum)
Ana Franco from lab reports pt has and INR greater than 10. MD Pollina made aware. Will Repeat INR.

## 2014-06-12 NOTE — ED Notes (Signed)
Bed: WA16 Expected date:  Expected time:  Means of arrival:  Comments: ems 

## 2014-06-12 NOTE — H&P (Signed)
ORTHOPAEDIC CONSULTATION  REQUESTING PHYSICIAN: Ninetta Lights, MD  Chief Complaint:  "I hurt my ankle this morning as I was getting out of bed to use the restroom"  HPI: Ana Franco is a 80 y.o. female  who states that she was urgently getting out of bed this am to run to the restroom when she lost her footing and fell flat on her buttocks.  At that point she remembers her ankle rolling inwards.  She did not note immediate pain at the time, but this did become bothersome a few hours later.  She has not been able to ambulate since the fall.  She presented to the [facility] ED this afternoon via ambulance.  She does have a hx of right ankle fracture nearly 10 years ago as well as a right ankle sprain 3 years ago, neither of which required operative intervention.  Of note, she is a very independent female who lives at home alone.  Prior to the fall, she was able to ambulate without assistance.     History reviewed. No pertinent past medical history. Past Surgical History  Procedure Laterality Date  . Appendectomy     History   Social History  . Marital Status: Widowed    Spouse Name: N/A    Number of Children: N/A  . Years of Education: N/A   Social History Main Topics  . Smoking status: Never Smoker   . Smokeless tobacco: None  . Alcohol Use: No  . Drug Use: No  . Sexual Activity: None   Other Topics Concern  . None   Social History Narrative  . None   No family history on file. No Known Allergies Prior to Admission medications   Medication Sig Start Date End Date Taking? Authorizing Provider  amLODipine (NORVASC) 5 MG tablet Take 5 mg by mouth daily.   Yes Historical Provider, MD  atenolol (TENORMIN) 50 MG tablet Take 50 mg by mouth daily.   Yes Historical Provider, MD  citalopram (CELEXA) 20 MG tablet Take 20 mg by mouth daily.   Yes Historical Provider, MD  esomeprazole (NEXIUM) 40 MG capsule Take 40 mg by mouth daily at 12 noon.   Yes Historical Provider, MD   ezetimibe-simvastatin (VYTORIN) 10-40 MG per tablet Take 1 tablet by mouth at bedtime.   Yes Historical Provider, MD  losartan-hydrochlorothiazide (HYZAAR) 100-25 MG per tablet Take 1 tablet by mouth daily.   Yes Historical Provider, MD   Dg Ankle 2 Views Right  06/12/2014   CLINICAL DATA:  Ankle injury  EXAM: RIGHT ANKLE - 2 VIEW  COMPARISON:  None.  FINDINGS: The digits and metatarsals are intact. Tarsal bones are grossly intact. A complex ankle fracture with distal location is noted.  IMPRESSION: Complex ankle fracture with dislocation.  No obvious foot fracture.   Electronically Signed   By: Maryclare Bean M.D.   On: 06/12/2014 14:28   Dg Chest Port 1 View  06/12/2014   CLINICAL DATA:  Fall.  Initial evaluation.  EXAM: PORTABLE CHEST - 1 VIEW  COMPARISON:  03/10/2004.  FINDINGS: Mediastinum and hilar structures normal. Cardiomegaly with normal pulmonary vascularity. Left base pleural parenchymal thickening consistent with scarring. No infiltrate. No pleural effusion. No pneumothorax. No acute bony abnormality identified.  IMPRESSION: 1. Pleural parenchymal scarring left lung base. 2. Cardiomegaly, no CHF.  No acute pulmonary disease.   Electronically Signed   By: Marcello Moores  Register   On: 06/12/2014 14:27   Dg Foot 2 Views Right  06/12/2014  CLINICAL DATA:  Post reduction ankle.  EXAM: RIGHT FOOT - 2 VIEW  COMPARISON:  None.  FINDINGS: Patient is in a splint. Bimalleolar fractures are present. Fractures are laterally displaced. Tibiotalar disruption is present.  IMPRESSION: Bimalleolar fractures with prominent displacement. Tibiotalar joint disruption. Patient is in a splint.   Electronically Signed   By: Marcello Moores  Register   On: 06/12/2014 14:29    Positive ROS: All other systems have been reviewed and were otherwise negative with the exception of those mentioned in the HPI and as above.  Labs cbc  Recent Labs  06/12/14 1234  WBC 18.0*  HGB 11.5*  HCT 35.4*  PLT 217    Labs inflam No results  for input(s): CRP in the last 72 hours.  Invalid input(s): ESR  Labs coag  Recent Labs  06/12/14 1234 06/12/14 1359  INR >10.00* 0.99     Recent Labs  06/12/14 1234  NA 140  K 4.2  CL 102  CO2 23  GLUCOSE 142*  BUN 35*  CREATININE 1.09  CALCIUM 9.5    Physical Exam: Filed Vitals:   06/12/14 1300  BP: 145/61  Pulse: 80  Resp: 17   General: Alert, no acute distress Cardiovascular: No pedal edema Respiratory: No cyanosis, no use of accessory musculature GI: No organomegaly, abdomen is soft and non-tender Skin: 1 cm laceration to the medial aspect of the ankle Neurologic: Sensation intact distally Psychiatric: Patient is competent for consent with normal mood and affect Lymphatic: No axillary or cervical lymphadenopathy  MUSCULOSKELETAL:  RLE Short leg splint in place Neurovascularly intact Good sensation throughout  Other extremities are atraumatic with painless ROM and NVI.  Assessment: Type I open bimalleolar fracture right ankle 1 cm transverse laceration medial aspect of right ankle  Plan: Transfer to [facility] today (5N) Plan for ORIF bimal fxr right ankle tomorrow NPO after midnight tonight NWB RLE No chemical ppx prior to sx.  Will Add chemical ppx post-op   Larae Grooms, PA-C

## 2014-06-12 NOTE — ED Notes (Signed)
She states her right ankle is beginning to "hurt, kinda burn".  Pain med. Given.  CMS remains intact with immediate cap. Refill all toes bilat.

## 2014-06-13 ENCOUNTER — Inpatient Hospital Stay (HOSPITAL_COMMUNITY): Payer: Medicare Other | Admitting: Anesthesiology

## 2014-06-13 ENCOUNTER — Encounter (HOSPITAL_COMMUNITY)
Admission: EM | Disposition: A | Discharge status: Skilled Nursing Facility | Payer: Self-pay | Source: Home / Self Care | Attending: Orthopedic Surgery

## 2014-06-13 ENCOUNTER — Encounter (HOSPITAL_COMMUNITY): Payer: Self-pay | Admitting: Certified Registered Nurse Anesthetist

## 2014-06-13 HISTORY — PX: ORIF ANKLE FRACTURE: SHX5408

## 2014-06-13 LAB — URINALYSIS, ROUTINE W REFLEX MICROSCOPIC
Bilirubin Urine: NEGATIVE
Glucose, UA: NEGATIVE mg/dL
Ketones, ur: NEGATIVE mg/dL
Nitrite: POSITIVE — AB
Protein, ur: NEGATIVE mg/dL
Specific Gravity, Urine: 1.018 (ref 1.005–1.030)
Urobilinogen, UA: 0.2 mg/dL (ref 0.0–1.0)
pH: 5 (ref 5.0–8.0)

## 2014-06-13 LAB — URINE MICROSCOPIC-ADD ON

## 2014-06-13 LAB — MRSA PCR SCREENING: MRSA by PCR: NEGATIVE

## 2014-06-13 LAB — ABO/RH: ABO/RH(D): B POS

## 2014-06-13 SURGERY — OPEN REDUCTION INTERNAL FIXATION (ORIF) ANKLE FRACTURE
Anesthesia: General | Site: Ankle | Laterality: Right

## 2014-06-13 SURGERY — OPEN REDUCTION INTERNAL FIXATION (ORIF) ANKLE FRACTURE
Anesthesia: Choice

## 2014-06-13 MED ORDER — FENTANYL CITRATE 0.05 MG/ML IJ SOLN
INTRAMUSCULAR | Status: DC | PRN
  Administered 2014-06-13 (×2): 50 ug via INTRAVENOUS

## 2014-06-13 MED ORDER — BUPIVACAINE HCL (PF) 0.25 % IJ SOLN
INTRAMUSCULAR | Status: AC
  Filled 2014-06-13: qty 30

## 2014-06-13 MED ORDER — BUPIVACAINE-EPINEPHRINE (PF) 0.5% -1:200000 IJ SOLN
INTRAMUSCULAR | Status: DC | PRN
  Administered 2014-06-13: 26 mL via PERINEURAL

## 2014-06-13 MED ORDER — METOCLOPRAMIDE HCL 10 MG PO TABS: 5.0000 mg | ORAL_TABLET | Freq: Three times a day (TID) | ORAL | Status: DC | PRN

## 2014-06-13 MED ORDER — ONDANSETRON HCL 4 MG PO TABS: 4.0000 mg | ORAL_TABLET | Freq: Four times a day (QID) | ORAL | Status: DC | PRN

## 2014-06-13 MED ORDER — MIDAZOLAM HCL 5 MG/5ML IJ SOLN
INTRAMUSCULAR | Status: DC | PRN
  Administered 2014-06-13: 1 mg via INTRAVENOUS

## 2014-06-13 MED ORDER — LACTATED RINGERS IV SOLN
INTRAVENOUS | Status: DC
  Administered 2014-06-13: 13:00:00 via INTRAVENOUS

## 2014-06-13 MED ORDER — ONDANSETRON HCL 4 MG/2ML IJ SOLN
INTRAMUSCULAR | Status: DC | PRN
  Administered 2014-06-13: 4 mg via INTRAVENOUS

## 2014-06-13 MED ORDER — MIDAZOLAM HCL 2 MG/2ML IJ SOLN
INTRAMUSCULAR | Status: AC
Start: 2014-06-13 — End: 2014-06-13
  Filled 2014-06-13: qty 2

## 2014-06-13 MED ORDER — LIDOCAINE HCL (CARDIAC) 20 MG/ML IV SOLN
INTRAVENOUS | Status: AC
  Filled 2014-06-13: qty 5

## 2014-06-13 MED ORDER — MEPERIDINE HCL 25 MG/ML IJ SOLN: 6.2500 mg | INTRAMUSCULAR | Status: DC | PRN

## 2014-06-13 MED ORDER — METHOCARBAMOL 500 MG PO TABS: 500.0000 mg | ORAL_TABLET | Freq: Four times a day (QID) | ORAL | Status: DC | PRN

## 2014-06-13 MED ORDER — ASPIRIN 325 MG PO TABS
325.0000 mg | ORAL_TABLET | Freq: Every day | ORAL | Status: DC
  Administered 2014-06-13 – 2014-06-16 (×4): 325 mg via ORAL
  Filled 2014-06-13 (×4): qty 1

## 2014-06-13 MED ORDER — ONDANSETRON HCL 4 MG PO TABS: 4.0000 mg | ORAL_TABLET | Freq: Three times a day (TID) | ORAL | Status: DC | PRN

## 2014-06-13 MED ORDER — ATENOLOL 50 MG PO TABS
50.0000 mg | ORAL_TABLET | Freq: Every day | ORAL | Status: DC
  Administered 2014-06-13: 50 mg via ORAL
  Filled 2014-06-13: qty 1

## 2014-06-13 MED ORDER — HYDROMORPHONE HCL 1 MG/ML IJ SOLN: 0.5000 mg | INTRAMUSCULAR | Status: DC | PRN

## 2014-06-13 MED ORDER — ASPIRIN EC 325 MG PO TBEC: 325.0000 mg | DELAYED_RELEASE_TABLET | Freq: Every day | ORAL | Status: DC

## 2014-06-13 MED ORDER — FENTANYL CITRATE 0.05 MG/ML IJ SOLN
INTRAMUSCULAR | Status: AC
  Filled 2014-06-13: qty 5

## 2014-06-13 MED ORDER — LOSARTAN POTASSIUM-HCTZ 100-25 MG PO TABS: 1.0000 | ORAL_TABLET | Freq: Every day | ORAL | Status: DC

## 2014-06-13 MED ORDER — ONDANSETRON HCL 4 MG/2ML IJ SOLN: 4.0000 mg | Freq: Four times a day (QID) | INTRAMUSCULAR | Status: DC | PRN

## 2014-06-13 MED ORDER — HYDROCHLOROTHIAZIDE 25 MG PO TABS
25.0000 mg | ORAL_TABLET | Freq: Every day | ORAL | Status: DC
  Administered 2014-06-13 – 2014-06-16 (×4): 25 mg via ORAL
  Filled 2014-06-13 (×4): qty 1

## 2014-06-13 MED ORDER — EZETIMIBE-SIMVASTATIN 10-40 MG PO TABS
1.0000 | ORAL_TABLET | Freq: Every day | ORAL | Status: DC
  Administered 2014-06-13 – 2014-06-15 (×3): 1 via ORAL
  Filled 2014-06-13 (×4): qty 1

## 2014-06-13 MED ORDER — LIDOCAINE HCL (CARDIAC) 20 MG/ML IV SOLN
INTRAVENOUS | Status: DC | PRN
  Administered 2014-06-13: 50 mg via INTRAVENOUS

## 2014-06-13 MED ORDER — BISACODYL 5 MG PO TBEC: 5.0000 mg | DELAYED_RELEASE_TABLET | Freq: Every day | ORAL | Status: DC | PRN

## 2014-06-13 MED ORDER — DOCUSATE SODIUM 100 MG PO CAPS
100.0000 mg | ORAL_CAPSULE | Freq: Two times a day (BID) | ORAL | Status: DC
  Administered 2014-06-13 – 2014-06-16 (×6): 100 mg via ORAL
  Filled 2014-06-13 (×7): qty 1

## 2014-06-13 MED ORDER — POTASSIUM CHLORIDE IN NACL 20-0.9 MEQ/L-% IV SOLN
INTRAVENOUS | Status: DC
  Administered 2014-06-13 – 2014-06-14 (×2): via INTRAVENOUS
  Filled 2014-06-13 (×3): qty 1000

## 2014-06-13 MED ORDER — 0.9 % SODIUM CHLORIDE (POUR BTL) OPTIME
TOPICAL | Status: DC | PRN
  Administered 2014-06-13: 1000 mL

## 2014-06-13 MED ORDER — PROPOFOL 10 MG/ML IV BOLUS
INTRAVENOUS | Status: AC
  Filled 2014-06-13: qty 20

## 2014-06-13 MED ORDER — METOCLOPRAMIDE HCL 5 MG/ML IJ SOLN: 5.0000 mg | Freq: Three times a day (TID) | INTRAMUSCULAR | Status: DC | PRN

## 2014-06-13 MED ORDER — DIPHENHYDRAMINE HCL 12.5 MG/5ML PO ELIX: 12.5000 mg | ORAL_SOLUTION | ORAL | Status: DC | PRN

## 2014-06-13 MED ORDER — METHOCARBAMOL 1000 MG/10ML IJ SOLN
500.0000 mg | Freq: Four times a day (QID) | INTRAVENOUS | Status: DC | PRN
  Filled 2014-06-13: qty 5

## 2014-06-13 MED ORDER — CEFAZOLIN SODIUM-DEXTROSE 2-3 GM-% IV SOLR
2.0000 g | Freq: Four times a day (QID) | INTRAVENOUS | Status: AC
  Administered 2014-06-13 – 2014-06-14 (×3): 2 g via INTRAVENOUS
  Filled 2014-06-13 (×3): qty 50

## 2014-06-13 MED ORDER — PROMETHAZINE HCL 25 MG/ML IJ SOLN: 6.2500 mg | INTRAMUSCULAR | Status: DC | PRN

## 2014-06-13 MED ORDER — DEXAMETHASONE SODIUM PHOSPHATE 4 MG/ML IJ SOLN
INTRAMUSCULAR | Status: AC
  Filled 2014-06-13: qty 1

## 2014-06-13 MED ORDER — ROCURONIUM BROMIDE 50 MG/5ML IV SOLN
INTRAVENOUS | Status: AC
  Filled 2014-06-13: qty 1

## 2014-06-13 MED ORDER — SODIUM CHLORIDE 0.9 % IR SOLN
Status: DC | PRN
  Administered 2014-06-13: 3000 mL

## 2014-06-13 MED ORDER — LOSARTAN POTASSIUM 50 MG PO TABS
100.0000 mg | ORAL_TABLET | Freq: Every day | ORAL | Status: DC
  Administered 2014-06-13 – 2014-06-16 (×4): 100 mg via ORAL
  Filled 2014-06-13 (×4): qty 2

## 2014-06-13 MED ORDER — INFLUENZA VAC SPLIT QUAD 0.5 ML IM SUSY: 0.5000 mL | PREFILLED_SYRINGE | INTRAMUSCULAR | Status: DC

## 2014-06-13 MED ORDER — DEXAMETHASONE SODIUM PHOSPHATE 4 MG/ML IJ SOLN
INTRAMUSCULAR | Status: DC | PRN
  Administered 2014-06-13: 4 mg via INTRAVENOUS

## 2014-06-13 MED ORDER — CITALOPRAM HYDROBROMIDE 20 MG PO TABS
20.0000 mg | ORAL_TABLET | Freq: Every day | ORAL | Status: DC
  Administered 2014-06-13 – 2014-06-16 (×4): 20 mg via ORAL
  Filled 2014-06-13 (×4): qty 1

## 2014-06-13 MED ORDER — HYDROCODONE-ACETAMINOPHEN 5-325 MG PO TABS: 1.0000 | ORAL_TABLET | ORAL | Status: DC | PRN

## 2014-06-13 MED ORDER — AMLODIPINE BESYLATE 5 MG PO TABS
5.0000 mg | ORAL_TABLET | Freq: Every day | ORAL | Status: DC
  Administered 2014-06-13 – 2014-06-16 (×4): 5 mg via ORAL
  Filled 2014-06-13 (×4): qty 1

## 2014-06-13 MED ORDER — EPHEDRINE SULFATE 50 MG/ML IJ SOLN
INTRAMUSCULAR | Status: DC | PRN
  Administered 2014-06-13 (×2): 5 mg via INTRAVENOUS

## 2014-06-13 MED ORDER — HYDROCODONE-ACETAMINOPHEN 7.5-325 MG PO TABS: 1.0000 | ORAL_TABLET | ORAL | Status: DC | PRN

## 2014-06-13 MED ORDER — PANTOPRAZOLE SODIUM 40 MG PO TBEC
80.0000 mg | DELAYED_RELEASE_TABLET | Freq: Every day | ORAL | Status: DC
  Administered 2014-06-14 – 2014-06-15 (×2): 80 mg via ORAL
  Filled 2014-06-13 (×2): qty 2

## 2014-06-13 MED ORDER — ONDANSETRON HCL 4 MG/2ML IJ SOLN
INTRAMUSCULAR | Status: AC
  Filled 2014-06-13: qty 2

## 2014-06-13 MED ORDER — FENTANYL CITRATE 0.05 MG/ML IJ SOLN: 25.0000 ug | INTRAMUSCULAR | Status: DC | PRN

## 2014-06-13 MED ORDER — ATENOLOL 50 MG PO TABS
50.0000 mg | ORAL_TABLET | Freq: Every day | ORAL | Status: DC
  Administered 2014-06-14 – 2014-06-16 (×3): 50 mg via ORAL
  Filled 2014-06-13 (×4): qty 1

## 2014-06-13 MED ORDER — PROPOFOL 10 MG/ML IV BOLUS
INTRAVENOUS | Status: DC | PRN
  Administered 2014-06-13: 120 mg via INTRAVENOUS

## 2014-06-13 SURGICAL SUPPLY — 70 items
0.25% MARCAINE 30MLS IMPLANT
BANDAGE ELASTIC 6 VELCRO ST LF (GAUZE/BANDAGES/DRESSINGS) ×3 IMPLANT
BANDAGE ESMARK 6X9 LF (GAUZE/BANDAGES/DRESSINGS) IMPLANT
BIT DRILL CANN 2.7 (BIT) ×2
BIT DRILL SRG 2.7XCANN AO CPLG (BIT) IMPLANT
BIT DRL SRG 2.7XCANN AO CPLNG (BIT) ×1
BNDG CMPR 9X6 STRL LF SNTH (GAUZE/BANDAGES/DRESSINGS) ×1
BNDG ESMARK 6X9 LF (GAUZE/BANDAGES/DRESSINGS) ×2
BOOTCOVER CLEANROOM LRG (PROTECTIVE WEAR) ×2 IMPLANT
COVER SURGICAL LIGHT HANDLE (MISCELLANEOUS) ×2 IMPLANT
CUFF TOURNIQUET SINGLE 18IN (TOURNIQUET CUFF) IMPLANT
CUFF TOURNIQUET SINGLE 24IN (TOURNIQUET CUFF) IMPLANT
CUFF TOURNIQUET SINGLE 34IN LL (TOURNIQUET CUFF) ×1 IMPLANT
CUFF TOURNIQUET SINGLE 44IN (TOURNIQUET CUFF) IMPLANT
DECANTER SPIKE VIAL GLASS SM (MISCELLANEOUS) ×1 IMPLANT
DRAPE C-ARM 42X72 X-RAY (DRAPES) IMPLANT
DRAPE OEC MINIVIEW 54X84 (DRAPES) ×1 IMPLANT
DRAPE U-SHAPE 47X51 STRL (DRAPES) ×1 IMPLANT
DRAPE X RAY CASS MED 25220 (DRAPES) IMPLANT
DRAPE X-RAY CASS 24X20 (DRAPES) IMPLANT
DRILL 2.6X122MM WL AO SHAFT (BIT) ×1 IMPLANT
DRSG PAD ABDOMINAL 8X10 ST (GAUZE/BANDAGES/DRESSINGS) ×1 IMPLANT
DURAPREP 26ML APPLICATOR (WOUND CARE) ×2 IMPLANT
ELECT REM PT RETURN 9FT ADLT (ELECTROSURGICAL) ×2
ELECTRODE REM PT RTRN 9FT ADLT (ELECTROSURGICAL) ×1 IMPLANT
FACESHIELD WRAPAROUND (MASK) IMPLANT
FACESHIELD WRAPAROUND OR TEAM (MASK) ×1 IMPLANT
GAUZE SPONGE 4X4 12PLY STRL (GAUZE/BANDAGES/DRESSINGS) ×2 IMPLANT
GAUZE XEROFORM 1X8 LF (GAUZE/BANDAGES/DRESSINGS) ×2 IMPLANT
GAUZE XEROFORM 5X9 LF (GAUZE/BANDAGES/DRESSINGS) ×1 IMPLANT
GLOVE BIO SURGEON STRL SZ 6.5 (GLOVE) ×2 IMPLANT
GLOVE BIO SURGEON STRL SZ8 (GLOVE) ×2 IMPLANT
GLOVE BIOGEL PI IND STRL 7.0 (GLOVE) ×1 IMPLANT
GLOVE BIOGEL PI INDICATOR 7.0 (GLOVE) ×1
GLOVE ORTHO TXT STRL SZ7.5 (GLOVE) ×4 IMPLANT
GOWN STRL REUS W/ TWL LRG LVL3 (GOWN DISPOSABLE) ×2 IMPLANT
GOWN STRL REUS W/TWL 2XL LVL3 (GOWN DISPOSABLE) ×2 IMPLANT
GOWN STRL REUS W/TWL LRG LVL3 (GOWN DISPOSABLE) ×4
HANDPIECE INTERPULSE COAX TIP (DISPOSABLE) ×2
K-WIRE ORTHOPEDIC 1.4X150L (WIRE) ×4
KIT BASIN OR (CUSTOM PROCEDURE TRAY) ×2 IMPLANT
KIT ROOM TURNOVER OR (KITS) ×2 IMPLANT
KWIRE ORTHOPEDIC 1.4X150L (WIRE) IMPLANT
MANIFOLD NEPTUNE II (INSTRUMENTS) ×2 IMPLANT
NS IRRIG 1000ML POUR BTL (IV SOLUTION) ×2 IMPLANT
PACK ORTHO EXTREMITY (CUSTOM PROCEDURE TRAY) ×2 IMPLANT
PAD ARMBOARD 7.5X6 YLW CONV (MISCELLANEOUS) ×4 IMPLANT
PAD CAST 4YDX4 CTTN HI CHSV (CAST SUPPLIES) ×2 IMPLANT
PADDING CAST COTTON 4X4 STRL (CAST SUPPLIES) ×2
PLATE STR 6HOLE (Plate) ×1 IMPLANT
SCREW BONE 14MMX3.5MM (Screw) ×3 IMPLANT
SCREW BONE 3.5X20MM (Screw) ×1 IMPLANT
SCREW CANNULATED 4.0 (Screw) ×1 IMPLANT
SCREW CANNULATED 46X4.0MM (Screw) ×1 IMPLANT
SCREW LOCK 20MMX3.5 (Screw) ×2 IMPLANT
SET HNDPC FAN SPRY TIP SCT (DISPOSABLE) IMPLANT
SPONGE LAP 4X18 X RAY DECT (DISPOSABLE) ×3 IMPLANT
STAPLER VISISTAT 35W (STAPLE) ×2 IMPLANT
SUCTION FRAZIER TIP 10 FR DISP (SUCTIONS) ×2 IMPLANT
SUT ETHILON 3 0 PS 1 (SUTURE) ×5 IMPLANT
SUT VIC AB 0 CT1 27 (SUTURE) ×2
SUT VIC AB 0 CT1 27XBRD ANBCTR (SUTURE) IMPLANT
SUT VIC AB 0 CTB1 27 (SUTURE) ×3 IMPLANT
SUT VIC AB 2-0 CTB1 (SUTURE) ×4 IMPLANT
SYR CONTROL 10ML LL (SYRINGE) ×1 IMPLANT
TOWEL OR 17X24 6PK STRL BLUE (TOWEL DISPOSABLE) ×2 IMPLANT
TOWEL OR 17X26 10 PK STRL BLUE (TOWEL DISPOSABLE) ×2 IMPLANT
TUBE CONNECTING 12X1/4 (SUCTIONS) ×2 IMPLANT
UNDERPAD 30X30 INCONTINENT (UNDERPADS AND DIAPERS) ×2 IMPLANT
YANKAUER SUCT BULB TIP NO VENT (SUCTIONS) IMPLANT

## 2014-06-13 SURGICAL SUPPLY — 52 items
BANDAGE ELASTIC 6 VELCRO ST LF (GAUZE/BANDAGES/DRESSINGS) ×6 IMPLANT
BANDAGE ESMARK 6X9 LF (GAUZE/BANDAGES/DRESSINGS) IMPLANT
BNDG CMPR 9X6 STRL LF SNTH (GAUZE/BANDAGES/DRESSINGS)
BNDG ESMARK 6X9 LF (GAUZE/BANDAGES/DRESSINGS)
BOOTCOVER CLEANROOM LRG (PROTECTIVE WEAR) ×6 IMPLANT
COVER SURGICAL LIGHT HANDLE (MISCELLANEOUS) ×3 IMPLANT
CUFF TOURNIQUET SINGLE 18IN (TOURNIQUET CUFF) IMPLANT
CUFF TOURNIQUET SINGLE 24IN (TOURNIQUET CUFF) IMPLANT
CUFF TOURNIQUET SINGLE 34IN LL (TOURNIQUET CUFF) IMPLANT
CUFF TOURNIQUET SINGLE 44IN (TOURNIQUET CUFF) IMPLANT
DECANTER SPIKE VIAL GLASS SM (MISCELLANEOUS) IMPLANT
DRAPE C-ARM 42X72 X-RAY (DRAPES) IMPLANT
DRAPE OEC MINIVIEW 54X84 (DRAPES) IMPLANT
DRAPE U-SHAPE 47X51 STRL (DRAPES) IMPLANT
DRAPE X RAY CASS MED 25220 (DRAPES) IMPLANT
DRAPE X-RAY CASS 24X20 (DRAPES) IMPLANT
DRSG PAD ABDOMINAL 8X10 ST (GAUZE/BANDAGES/DRESSINGS) ×3 IMPLANT
DURAPREP 26ML APPLICATOR (WOUND CARE) ×3 IMPLANT
ELECT REM PT RETURN 9FT ADLT (ELECTROSURGICAL) ×2
ELECTRODE REM PT RTRN 9FT ADLT (ELECTROSURGICAL) ×2 IMPLANT
FACESHIELD WRAPAROUND (MASK) ×2 IMPLANT
FACESHIELD WRAPAROUND OR TEAM (MASK) ×1 IMPLANT
GAUZE SPONGE 4X4 12PLY STRL (GAUZE/BANDAGES/DRESSINGS) ×3 IMPLANT
GAUZE XEROFORM 1X8 LF (GAUZE/BANDAGES/DRESSINGS) ×6 IMPLANT
GLOVE BIO SURGEON STRL SZ 6.5 (GLOVE) ×3 IMPLANT
GLOVE BIO SURGEON STRL SZ8 (GLOVE) ×3 IMPLANT
GLOVE BIOGEL PI IND STRL 7.0 (GLOVE) ×2 IMPLANT
GLOVE BIOGEL PI INDICATOR 7.0 (GLOVE) ×1
GLOVE ORTHO TXT STRL SZ7.5 (GLOVE) ×6 IMPLANT
GOWN STRL REUS W/ TWL LRG LVL3 (GOWN DISPOSABLE) ×4 IMPLANT
GOWN STRL REUS W/TWL 2XL LVL3 (GOWN DISPOSABLE) ×3 IMPLANT
GOWN STRL REUS W/TWL LRG LVL3 (GOWN DISPOSABLE) ×4
KIT BASIN OR (CUSTOM PROCEDURE TRAY) ×3 IMPLANT
KIT ROOM TURNOVER OR (KITS) ×3 IMPLANT
MANIFOLD NEPTUNE II (INSTRUMENTS) ×3 IMPLANT
NS IRRIG 1000ML POUR BTL (IV SOLUTION) ×3 IMPLANT
PACK ORTHO EXTREMITY (CUSTOM PROCEDURE TRAY) ×3 IMPLANT
PAD ARMBOARD 7.5X6 YLW CONV (MISCELLANEOUS) ×6 IMPLANT
PAD CAST 4YDX4 CTTN HI CHSV (CAST SUPPLIES) ×4 IMPLANT
PADDING CAST COTTON 4X4 STRL (CAST SUPPLIES) ×4
SPONGE LAP 4X18 X RAY DECT (DISPOSABLE) ×6 IMPLANT
STAPLER VISISTAT 35W (STAPLE) ×3 IMPLANT
SUCTION FRAZIER TIP 10 FR DISP (SUCTIONS) ×3 IMPLANT
SUT VIC AB 0 CTB1 27 (SUTURE) ×6 IMPLANT
SUT VIC AB 2-0 CTB1 (SUTURE) ×6 IMPLANT
SYR CONTROL 10ML LL (SYRINGE) IMPLANT
TOWEL OR 17X24 6PK STRL BLUE (TOWEL DISPOSABLE) ×3 IMPLANT
TOWEL OR 17X26 10 PK STRL BLUE (TOWEL DISPOSABLE) ×3 IMPLANT
TUBE CONNECTING 12X1/4 (SUCTIONS) ×3 IMPLANT
UNDERPAD 30X30 INCONTINENT (UNDERPADS AND DIAPERS) ×3 IMPLANT
WATER STERILE IRR 1000ML POUR (IV SOLUTION) ×3 IMPLANT
YANKAUER SUCT BULB TIP NO VENT (SUCTIONS) IMPLANT

## 2014-06-13 NOTE — H&P (View-Only) (Signed)
ORTHOPAEDIC CONSULTATION  REQUESTING PHYSICIAN: Ninetta Lights, MD  Chief Complaint:  "I hurt my ankle this morning as I was getting out of bed to use the restroom"  HPI: Ana Franco is a 80 y.o. female  who states that she was urgently getting out of bed this am to run to the restroom when she lost her footing and fell flat on her buttocks.  At that point she remembers her ankle rolling inwards.  She did not note immediate pain at the time, but this did become bothersome a few hours later.  She has not been able to ambulate since the fall.  She presented to the [facility] ED this afternoon via ambulance.  She does have a hx of right ankle fracture nearly 10 years ago as well as a right ankle sprain 3 years ago, neither of which required operative intervention.  Of note, she is a very independent female who lives at home alone.  Prior to the fall, she was able to ambulate without assistance.     History reviewed. No pertinent past medical history. Past Surgical History  Procedure Laterality Date  . Appendectomy     History   Social History  . Marital Status: Widowed    Spouse Name: N/A    Number of Children: N/A  . Years of Education: N/A   Social History Main Topics  . Smoking status: Never Smoker   . Smokeless tobacco: None  . Alcohol Use: No  . Drug Use: No  . Sexual Activity: None   Other Topics Concern  . None   Social History Narrative  . None   No family history on file. No Known Allergies Prior to Admission medications   Medication Sig Start Date End Date Taking? Authorizing Provider  amLODipine (NORVASC) 5 MG tablet Take 5 mg by mouth daily.   Yes Historical Provider, MD  atenolol (TENORMIN) 50 MG tablet Take 50 mg by mouth daily.   Yes Historical Provider, MD  citalopram (CELEXA) 20 MG tablet Take 20 mg by mouth daily.   Yes Historical Provider, MD  esomeprazole (NEXIUM) 40 MG capsule Take 40 mg by mouth daily at 12 noon.   Yes Historical Provider, MD   ezetimibe-simvastatin (VYTORIN) 10-40 MG per tablet Take 1 tablet by mouth at bedtime.   Yes Historical Provider, MD  losartan-hydrochlorothiazide (HYZAAR) 100-25 MG per tablet Take 1 tablet by mouth daily.   Yes Historical Provider, MD   Dg Ankle 2 Views Right  06/12/2014   CLINICAL DATA:  Ankle injury  EXAM: RIGHT ANKLE - 2 VIEW  COMPARISON:  None.  FINDINGS: The digits and metatarsals are intact. Tarsal bones are grossly intact. A complex ankle fracture with distal location is noted.  IMPRESSION: Complex ankle fracture with dislocation.  No obvious foot fracture.   Electronically Signed   By: Maryclare Bean M.D.   On: 06/12/2014 14:28   Dg Chest Port 1 View  06/12/2014   CLINICAL DATA:  Fall.  Initial evaluation.  EXAM: PORTABLE CHEST - 1 VIEW  COMPARISON:  03/10/2004.  FINDINGS: Mediastinum and hilar structures normal. Cardiomegaly with normal pulmonary vascularity. Left base pleural parenchymal thickening consistent with scarring. No infiltrate. No pleural effusion. No pneumothorax. No acute bony abnormality identified.  IMPRESSION: 1. Pleural parenchymal scarring left lung base. 2. Cardiomegaly, no CHF.  No acute pulmonary disease.   Electronically Signed   By: Marcello Moores  Register   On: 06/12/2014 14:27   Dg Foot 2 Views Right  06/12/2014   CLINICAL DATA:  Post reduction ankle.  EXAM: RIGHT FOOT - 2 VIEW  COMPARISON:  None.  FINDINGS: Patient is in a splint. Bimalleolar fractures are present. Fractures are laterally displaced. Tibiotalar disruption is present.  IMPRESSION: Bimalleolar fractures with prominent displacement. Tibiotalar joint disruption. Patient is in a splint.   Electronically Signed   By: Marcello Moores  Register   On: 06/12/2014 14:29    Positive ROS: All other systems have been reviewed and were otherwise negative with the exception of those mentioned in the HPI and as above.  Labs cbc  Recent Labs  06/12/14 1234  WBC 18.0*  HGB 11.5*  HCT 35.4*  PLT 217    Labs inflam No results  for input(s): CRP in the last 72 hours.  Invalid input(s): ESR  Labs coag  Recent Labs  06/12/14 1234 06/12/14 1359  INR >10.00* 0.99     Recent Labs  06/12/14 1234  NA 140  K 4.2  CL 102  CO2 23  GLUCOSE 142*  BUN 35*  CREATININE 1.09  CALCIUM 9.5    Physical Exam: Filed Vitals:   06/12/14 1300  BP: 145/61  Pulse: 80  Resp: 17   General: Alert, no acute distress Cardiovascular: No pedal edema Respiratory: No cyanosis, no use of accessory musculature GI: No organomegaly, abdomen is soft and non-tender Skin: 1 cm laceration to the medial aspect of the ankle Neurologic: Sensation intact distally Psychiatric: Patient is competent for consent with normal mood and affect Lymphatic: No axillary or cervical lymphadenopathy  MUSCULOSKELETAL:  RLE Short leg splint in place Neurovascularly intact Good sensation throughout  Other extremities are atraumatic with painless ROM and NVI.  Assessment: Type I open bimalleolar fracture right ankle 1 cm transverse laceration medial aspect of right ankle  Plan: Transfer to [facility] today (5N) Plan for ORIF bimal fxr right ankle tomorrow NPO after midnight tonight NWB RLE No chemical ppx prior to sx.  Will Add chemical ppx post-op   Larae Grooms, PA-C Cell [phone]   06/12/2014 3:01 PM

## 2014-06-13 NOTE — Anesthesia Procedure Notes (Addendum)
Anesthesia Regional Block:  Adductor canal block  Pre-Anesthetic Checklist: ,, timeout performed,, Correct Site,, Correct Procedure,, site marked,,, surgical consent,, at surgeon's request  Laterality: Right  Prep: Maximum Sterile Barrier Precautions used and chloraprep       Needles:   Needle Type: Echogenic Stimulator Needle     Needle Length: 10cm 10 cm Needle Gauge: 22 and 22 G    Additional Needles:  Procedures: ultrasound guided (picture in chart) Adductor canal block Narrative:  Injection made incrementally with aspirations every 5 mL.  Additional Notes: R AD canal block, 26cc .5% marcaine with epi, US, multiple asp, talked with patient throughout, sterile, no complications   Anesthesia Regional Block:  Popliteal block  Pre-Anesthetic Checklist: ,, timeout performed, Correct Patient, Correct Site, Correct Laterality, Correct Procedure, Correct Position, site marked, Risks and benefits discussed,  Surgical consent,  Pre-op evaluation,  At surgeon's request and post-op pain management  Laterality: Right  Prep: chloraprep       Needles:  Injection technique: Single-shot  Needle Type: Echogenic Stimulator Needle     Needle Length: 10cm 10 cm Needle Gauge: 21 and 21 G    Additional Needles:  Procedures: ultrasound guided (picture in chart) and nerve stimulator Popliteal block  Nerve Stimulator or Paresthesia:  Response: 0.4 mA,   Additional Responses:   Narrative:  Start time: 06/13/2014 2:15 PM End time: 06/13/2014 2:30 PM Injection made incrementally with aspirations every 5 mL.  Performed by: Personally   Additional Notes: Monitors applied. Patient sedated. Sterile prep and drape,hand hygiene and sterile gloves were used. Relevant anatomy identified.Needle position confirmed.Local anesthetic injected incrementally after negative aspiration. Local anesthetic spread visualized around nerve(s). Vascular puncture avoided. No complications. Image printed  for medical record.The patient tolerated the procedure well.  Additional Saphenous nerve block performed. 15cc Local Anesthetic mixture placed under ultrasonic guidance along the medio-inferior border of the Sartorious muscle 6 inches above the knee.  No Problems encountered.  Arta BruceKevin Jalin Erpelding MD

## 2014-06-13 NOTE — Interval H&P Note (Signed)
History and Physical Interval Note:  06/13/2014 1:11 PM  Ana CellaDorothy Franco  has presented today for surgery, with the diagnosis of fractured ankle  The various methods of treatment have been discussed with the patient and family. After consideration of risks, benefits and other options for treatment, the patient has consented to  Procedure(s): OPEN REDUCTION INTERNAL FIXATION (ORIF) BIMALLEOLAR ANKLE FRACTURE (Right) as a surgical intervention .  The patient's history has been reviewed, patient examined, no change in status, stable for surgery.  I have reviewed the patient's chart and labs.  Questions were answered to the patient's satisfaction.     Laurell Coalson F

## 2014-06-13 NOTE — Transfer of Care (Signed)
Immediate Anesthesia Transfer of Care Note  Patient: Ana Franco  Procedure(s) Performed: Procedure(s): OPEN REDUCTION INTERNAL FIXATION (ORIF) BIMALLEOLAR ANKLE FRACTURE (Right)  Patient Location: PACU  Anesthesia Type:General and GA combined with regional for post-op pain  Level of Consciousness: sedated, patient cooperative and responds to stimulation  Airway & Oxygen Therapy: Patient Spontanous Breathing and Patient connected to nasal cannula oxygen  Post-op Assessment: Report given to PACU RN, Post -op Vital signs reviewed and stable and Patient moving all extremities  Post vital signs: Reviewed and stable  Complications: No apparent anesthesia complications

## 2014-06-13 NOTE — Discharge Instructions (Signed)
Bimalleolar Fracture, Ankle, Adult, Displaced (ORIF), Care After Read the instructions outlined below and refer to this sheet in the next few weeks. These discharge instructions provide you with general information on caring for yourself after you leave the hospital. Your doctor may also give you specific instructions. While your treatment has been planned according to the most current medical practices available, unavoidable complications occasionally occur. If you have any problems or questions after discharge, please call your caregiver. HOME CARE INSTRUCTIONS  NON-WEIGHT BEARING AT ALL TIMES.  DO NOT REMOVE SPLINT.  MAY SHOWER, BUT DO NOT SOAK SPLINT.  FOLLOW UP APPOINTMENT IN ONE WEEK.    You may resume normal diet and activities as directed or allowed. Use crutches as instructed.  Keep ice packs (a bag of ice wrapped in a towel) on the surgical area for 15-20 minutes, 03-04 times per day, for the first two days following surgery. Use the ice only if OK with your surgeon or caregiver.  Change dressings if necessary or as directed.  If you have a plaster or fiberglass splint or cast:  Do not try to scratch the skin under the cast using sharp or pointed objects.  Check the skin around the cast every day. You may put lotion on any red or sore areas.  Keep your cast or splint dry and clean.  Do not put pressure on any part of your cast or splint until it is fully hardened.  Your cast or splint can be protected during bathing with a plastic bag. Do not lower the cast or splint into water.  Take prescribed medication as directed. Only take over-the-counter or prescription medicines for pain, discomfort, or fever as directed by your caregiver.  Use crutches as directed and do not exercise leg unless instructed.  These are not fractures to be taken lightly! If the fracture displaces and gets out of position, it may eventually lead to arthritis and disability for the rest of your life.  Problems often follow even the best of care.  Follow all instructions given to you by your caregiver, make and keep follow up appointments. SEEK IMMEDIATE MEDICAL CARE IF:  You develop redness, swelling, numbness or increasing pain in the wound.  There is pus coming from the wound.  An unexplained oral temperature above 102 F (38.9 C) develops.  A bad smell is coming from the wound or dressing.  A breaking open of the wound (edges not staying together) occurs after stitches or staples have been removed. If you do not have a window in your cast for observing the wound, a discharge or minor bleeding may show up as a stain on the outside of your cast immediately after surgery. Report these findings to your caregiver. Document Released: 02/15/2005 Document Revised: 05/19/2013 Document Reviewed: 02/07/2009 [facility] Patient Information 2015 [employer] [place]. This information is not intended to replace advice given to you by your health care provider. Make sure you discuss any questions you have with your health care provider.

## 2014-06-13 NOTE — Progress Notes (Signed)
Utilization review completed.  

## 2014-06-13 NOTE — Anesthesia Preprocedure Evaluation (Addendum)
Anesthesia Evaluation  Patient identified by MRN, date of birth, ID band  Reviewed: Allergy & Precautions, H&P , NPO status , Patient's Chart, lab work & pertinent test results  Airway Mallampati: II  TM Distance: >3 FB Neck ROM: Full    Dental  (+) Teeth Intact, Dental Advisory Given   Pulmonary          Cardiovascular hypertension, Pt. on medications Rhythm:Regular  EKG LVH, CXR Cardiomegaly no CHF   Neuro/Psych    GI/Hepatic GERD-  Medicated,  Endo/Other    Renal/GU      Musculoskeletal   Abdominal (+)  Abdomen: soft.    Peds  Hematology  (+) anemia ,   Anesthesia Other Findings   Reproductive/Obstetrics                          Anesthesia Physical Anesthesia Plan  ASA: III  Anesthesia Plan: General   Post-op Pain Management: MAC Combined w/ Regional for Post-op pain   Induction: Intravenous  Airway Management Planned: LMA  Additional Equipment:   Intra-op Plan:   Post-operative Plan: Extubation in OR  Informed Consent: I have reviewed the patients History and Physical, chart, labs and discussed the procedure including the risks, benefits and alternatives for the proposed anesthesia with the patient or authorized representative who has indicated his/her understanding and acceptance.     Plan Discussed with: CRNA and Surgeon  Anesthesia Plan Comments:        Anesthesia Quick Evaluation

## 2014-06-13 NOTE — Anesthesia Postprocedure Evaluation (Signed)
Anesthesia Post Note  Patient: Ana Franco  Procedure(s) Performed: Procedure(s) (LRB): OPEN REDUCTION INTERNAL FIXATION (ORIF) BIMALLEOLAR ANKLE FRACTURE (Right)  Anesthesia type: general  Patient location: PACU  Post pain: Pain level controlled  Post assessment: Patient's Cardiovascular Status Stable  Last Vitals:  Filed Vitals:   06/13/14 1631  BP: 143/81  Pulse: 61  Temp: 36.4 C  Resp: 18    Post vital signs: Reviewed and stable  Level of consciousness: sedated  Complications: No apparent anesthesia complications

## 2014-06-14 LAB — BASIC METABOLIC PANEL
Anion gap: 13 (ref 5–15)
BUN: 21 mg/dL (ref 6–23)
CO2: 25 mEq/L (ref 19–32)
Calcium: 8.7 mg/dL (ref 8.4–10.5)
Chloride: 100 mEq/L (ref 96–112)
Creatinine, Ser: 0.96 mg/dL (ref 0.50–1.10)
GFR calc Af Amer: 60 mL/min — ABNORMAL LOW (ref >90)
GFR calc non Af Amer: 52 mL/min — ABNORMAL LOW (ref >90)
Glucose, Bld: 110 mg/dL — ABNORMAL HIGH (ref 70–99)
Potassium: 4.3 mEq/L (ref 3.7–5.3)
Sodium: 138 mEq/L (ref 137–147)

## 2014-06-14 LAB — CBC
HCT: 27.7 % — ABNORMAL LOW (ref 36.0–46.0)
Hemoglobin: 9 g/dL — ABNORMAL LOW (ref 12.0–15.0)
MCH: 30.6 pg (ref 26.0–34.0)
MCHC: 32.5 g/dL (ref 30.0–36.0)
MCV: 94.2 fL (ref 78.0–100.0)
Platelets: 173 10*3/uL (ref 150–400)
RBC: 2.94 MIL/uL — ABNORMAL LOW (ref 3.87–5.11)
RDW: 13.6 % (ref 11.5–15.5)
WBC: 10.3 10*3/uL (ref 4.0–10.5)

## 2014-06-14 MED ORDER — CIPROFLOXACIN HCL 250 MG PO TABS
250.0000 mg | ORAL_TABLET | Freq: Two times a day (BID) | ORAL | Status: DC
Start: 2014-06-14 — End: 2014-06-15
  Administered 2014-06-14 (×2): 250 mg via ORAL
  Filled 2014-06-14 (×5): qty 1

## 2014-06-14 MED ORDER — CIPROFLOXACIN HCL 250 MG PO TABS: 250.0000 mg | ORAL_TABLET | Freq: Two times a day (BID) | ORAL | Status: DC

## 2014-06-14 NOTE — Evaluation (Signed)
Occupational Therapy Evaluation and Discharge Patient Details Name: Ana SanesDorothy Call MRN: [id number] DOB: Dec 30, 1925 Today's Date: 06/14/2014    History of Present Illness Larey SeatFell on the way to bathroom with resultant RLE fx now s/p ORIF BIMALLEOLAR ANKLE FRACTURE ; PMHx:none pertinent   Clinical Impression   This 80 yo female admitted and underwent above presents to acute OT with decreased mobility, decreased balance, NWB'ing RLE, decreased ability to maintain NWB'ing, obesity, and generalized weakness all affecting her ability to care for herself at an Independent level pta. [facility] Acute OT will sign off.    Follow Up Recommendations  SNF    Equipment Recommendations   (TBD at next venue)       Precautions / Restrictions Precautions Precautions: Fall Restrictions Weight Bearing Restrictions: Yes RLE Weight Bearing: Non weight bearing      Mobility Bed Mobility Overal bed mobility: Modified Independent             General bed mobility comments: HOB up and use or rail  Transfers Overall transfer level: Needs assistance Equipment used: Rolling walker (2 wheeled) Transfers: Sit to/from [employer] CorporationStand;Squat Pivot Transfers Sit to Stand: Mod assist (however could not maintain NWB'ing so I had her sit back down on bed)   Squat pivot transfers: Mod assist;From elevated surface (going to pt's good leg)          Balance Overall balance assessment: Needs assistance Sitting-balance support: No upper extremity supported;Feet supported Sitting balance-Leahy Scale: Fair     Standing balance support: Bilateral upper extremity supported Standing balance-Leahy Scale: Poor                              ADL Overall ADL's : Needs assistance/impaired Eating/Feeding: Independent;Sitting   Grooming: Set up;Sitting   Upper Body Bathing: Set up;Sitting   Lower Body Bathing: Minimal assistance;Sitting/lateral leans   Upper Body Dressing :  Set up;Sitting   Lower Body Dressing: Moderate assistance;Sitting/lateral leans   Toilet Transfer: Moderate assistance;Squat-pivot (going to pt's good leg from bed to recliner)   Toileting- Clothing Manipulation and Hygiene: Moderate assistance;Sitting/lateral lean                         Pertinent Vitals/Pain Pain Assessment: No/denies pain        Extremity/Trunk Assessment Upper Extremity Assessment Upper Extremity Assessment: Overall WFL for tasks assessed              Cognition Arousal/Alertness: Awake/alert Behavior During Therapy: WFL for tasks assessed/performed Overall Cognitive Status: Within Functional Limits for tasks assessed                                Home Living Family/patient expects to be discharged to:: Skilled nursing facility                                 Additional Comments: 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           OT Diagnosis: Generalized weakness   OT Problem List: Decreased strength;Impaired balance (sitting and/or standing);Decreased knowledge of precautions;Decreased knowledge of use of DME or AE;Obesity      OT Goals(Current goals can be found in the care plan section) Acute Rehab OT Goals Patient Stated Goal: to go to rehab then home  End of Session Equipment Utilized During Treatment: Gait belt;Rolling walker Nurse Communication:  (NT: how to do squat pivot transfers with pt)  Activity Tolerance: Patient tolerated treatment well Patient left: in chair;with call bell/phone within reach   Time: 0756-0826 OT Time Calculation (min): 30 min Charges:  OT General Charges $OT Visit: 1 Procedure OT Evaluation $Initial OT Evaluation Tier I: 1 Procedure OT Treatments $Self Care/Home Management : 23-37 mins  Evette GeorgesLeonard, Kolina Kube Eva [phone] 06/14/2014, 8:46 AM

## 2014-06-14 NOTE — Discharge Summary (Addendum)
Patient ID: Ana SanesDorothy Franco MRN: [id number] DOB/AGE: 1925/09/29 80 y.o.  Admit date: 06/12/2014 Discharge date: 06/16/2014  Admission Diagnoses:  Active Problems:   Ankle fracture   Discharge Diagnoses:  Same  History reviewed. No pertinent past medical history.  Surgeries: Procedure(s): OPEN REDUCTION INTERNAL FIXATION (ORIF) BIMALLEOLAR ANKLE FRACTURE on 06/12/2014 - 06/13/2014   Consultants:    Discharged Condition: Improved  Hospital Course: Ana SanesDorothy Franco is an 80 y.o. female who was admitted 06/12/2014 for operative treatment of Type I open bimalleolar fracture right ankle. Patient has severe unremitting pain that affects sleep, daily activities, and work/hobbies. After pre-op clearance the patient was taken to the operating room on 06/12/2014 - 06/13/2014 and underwent  Procedure(s): OPEN REDUCTION INTERNAL FIXATION (ORIF) BIMALLEOLAR ANKLE FRACTURE.  Patient has not developed any post-operative complications.  However, it was found that she had a UTI on pre-op labs.  This is now being treated with antibiotics.    Patient was given perioperative antibiotics:      Anti-infectives    Start     Dose/Rate Route Frequency Ordered Stop   06/15/14 1000  sulfamethoxazole-trimethoprim (BACTRIM,SEPTRA) 400-80 MG per tablet 1 tablet     1 tablet Oral Every 12 hours 06/15/14 0739 06/22/14 0959   06/15/14 0000  sulfamethoxazole-trimethoprim (BACTRIM) 400-80 MG per tablet        06/15/14 0741     06/14/14 0800  ciprofloxacin (CIPRO) tablet 250 mg  Status:  Discontinued     250 mg Oral 2 times daily 06/14/14 0748 06/15/14 0737   06/14/14 0000  ciprofloxacin (CIPRO) 250 MG tablet  Status:  Discontinued     250 mg Oral 2 times daily 06/14/14 0751 06/15/14    06/13/14 2000  ceFAZolin (ANCEF) IVPB 2 g/50 mL premix     2 g100 mL/hr over 30 Minutes Intravenous Every 6 hours 06/13/14 1633 06/14/14 0901   06/13/14 0600  ceFAZolin (ANCEF) IVPB 2 g/50 mL premix     2 g100 mL/hr over 30 Minutes  Intravenous On call to O.R. 06/12/14 2039 06/13/14 1420   06/12/14 1500  ceFAZolin (ANCEF) IVPB 1 g/50 mL premix  Status:  Discontinued     1 g100 mL/hr over 30 Minutes Intravenous 3 times per day 06/12/14 1454 06/13/14 1633   06/12/14 1300  ceFAZolin (ANCEF) IVPB 1 g/50 mL premix     1 g100 mL/hr over 30 Minutes Intravenous  Once 06/12/14 1258 06/12/14 1456       Patient was given sequential compression devices, early ambulation, and chemoprophylaxis to prevent DVT.  Patient benefited maximally from hospital stay and there were no complications.    Recent vital signs:  Patient Vitals for the past 24 hrs:  BP Temp Temp src Pulse Resp SpO2  06/16/14 0527 (!) 178/67 mmHg 98.1 F (36.7 C) - 68 18 97 %  06/15/14 2015 (!) 181/61 mmHg 98.3 F (36.8 C) - 61 18 97 %  06/15/14 1317 (!) 175/59 mmHg 97.4 F (36.3 C) Oral 60 18 98 %  06/15/14 0600 (!) 131/96 mmHg 98 F (36.7 C) - 61 18 97 %     Recent laboratory studies:   Recent Labs  06/14/14 0610 06/15/14 0534  WBC 10.3 10.0  HGB 9.0* 9.0*  HCT 27.7* 27.5*  PLT 173 168  NA 138 140  K 4.3 3.9  CL 100 103  CO2 25 26  BUN 21 19  CREATININE 0.96 0.98  GLUCOSE 110* 103*  CALCIUM 8.7 8.8     Discharge Medications:  Medication List    TAKE these medications        amLODipine 5 MG tablet  Commonly known as:  NORVASC  Take 5 mg by mouth daily.     aspirin EC 325 MG tablet  Take 1 tablet (325 mg total) by mouth daily.     atenolol 50 MG tablet  Commonly known as:  TENORMIN  Take 50 mg by mouth daily.     bisacodyl 5 MG EC tablet  Commonly known as:  DULCOLAX  Take 1 tablet (5 mg total) by mouth daily as needed for moderate constipation.     citalopram 20 MG tablet  Commonly known as:  CELEXA  Take 20 mg by mouth daily.     esomeprazole 40 MG capsule  Commonly known as:  NEXIUM  Take 40 mg by mouth daily at 12 noon.     ezetimibe-simvastatin 10-40 MG per tablet  Commonly known as:  VYTORIN  Take 1 tablet by  mouth at bedtime.     HYDROcodone-acetaminophen 7.5-325 MG per tablet  Commonly known as:  NORCO  Take 1-2 tablets by mouth every 4 (four) hours as needed for moderate pain.     losartan-hydrochlorothiazide 100-25 MG per tablet  Commonly known as:  HYZAAR  Take 1 tablet by mouth daily.     ondansetron 4 MG tablet  Commonly known as:  ZOFRAN  Take 1 tablet (4 mg total) by mouth every 8 (eight) hours as needed for nausea or vomiting.     sulfamethoxazole-trimethoprim 400-80 MG per tablet  Commonly known as:  BACTRIM  Take one tab q12 hours x 6 days        Diagnostic Studies: Dg Ankle 2 Views Right  06-17-2014   CLINICAL DATA:  Ankle injury  EXAM: RIGHT ANKLE - 2 VIEW  COMPARISON:  None.  FINDINGS: The digits and metatarsals are intact. Tarsal bones are grossly intact. A complex ankle fracture with distal location is noted.  IMPRESSION: Complex ankle fracture with dislocation.  No obvious foot fracture.   Electronically Signed   By: Maryclare Bean M.D.   On: 06-17-14 14:28   Dg Chest Port 1 View  06-17-14   CLINICAL DATA:  Fall.  Initial evaluation.  EXAM: PORTABLE CHEST - 1 VIEW  COMPARISON:  03/10/2004.  FINDINGS: Mediastinum and hilar structures normal. Cardiomegaly with normal pulmonary vascularity. Left base pleural parenchymal thickening consistent with scarring. No infiltrate. No pleural effusion. No pneumothorax. No acute bony abnormality identified.  IMPRESSION: 1. Pleural parenchymal scarring left lung base. 2. Cardiomegaly, no CHF.  No acute pulmonary disease.   Electronically Signed   By: Maisie Fus  Register   On: June 17, 2014 14:27   Dg Foot 2 Views Right  17-Jun-2014   CLINICAL DATA:  Post reduction ankle.  EXAM: RIGHT FOOT - 2 VIEW  COMPARISON:  None.  FINDINGS: Patient is in a splint. Bimalleolar fractures are present. Fractures are laterally displaced. Tibiotalar disruption is present.  IMPRESSION: Bimalleolar fractures with prominent displacement. Tibiotalar joint disruption.  Patient is in a splint.   Electronically Signed   By: Maisie Fus  Register   On: Jun 17, 2014 14:29    Disposition:   Discharge Instructions    Call MD / Call 911    Complete by:  As directed   If you experience chest pain or shortness of breath, CALL 911 and be transported to the hospital emergency room.  If you develope a fever above 101 F, pus (white drainage) or increased drainage or redness at the  wound, or calf pain, call your surgeon's office.     Constipation Prevention    Complete by:  As directed   Drink plenty of fluids.  Prune juice may be helpful.  You may use a stool softener, such as Colace (over the counter) 100 mg twice a day.  Use MiraLax (over the counter) for constipation as needed.     Diet - low sodium heart healthy    Complete by:  As directed      Discharge instructions    Complete by:  As directed   Non-weight bearing right lower extremity.  Do not remove splint.  May shower, but do not wet splint.  Take Bactrim for urinary tract infection for the next 6 days.  Take Aspirin 325 mg for a total of 30 days after surgery to prevent blood clots.  Follow up appointment in two weeks.     Do not put a pillow under the knee. Place it under the heel.    Complete by:  As directed   Place gray foam under operative heel when in bed or in a chair to work on extension     Increase activity slowly as tolerated    Complete by:  As directed            Follow-up Information    Follow up with Loreta AveMURPHY,DANIEL F, MD.   Specialty:  Orthopedic Surgery   Contact information:   [address] [place] [address] [phone]        Signed: Gearldine ShownNTON, M. LINDSEY 06/16/2014, 5:51 AM

## 2014-06-14 NOTE — Clinical Social Work Psychosocial (Signed)
Clinical Social Work Department BRIEF PSYCHOSOCIAL ASSESSMENT 06/14/2014  Patient:  Ana Franco, Ana Franco     Account Number:  [id number]     Admit date:  06/12/2014  Clinical Social Worker:  Delrae Sawyers  Date/Time:  06/14/2014 03:20 PM  Referred by:  Physician  Date Referred:  06/14/2014 Referred for  SNF Placement   Other Referral:   none.   Interview type:  Patient Other interview type:   Pt's daughter-in-law and granddaughter at bedside.    PSYCHOSOCIAL DATA Living Status:  ALONE Admitted from facility:   Level of care:   Primary support name:  Ana Franco Primary support relationship to patient:  CHILD, ADULT Degree of support available:   Strong support system.    CURRENT CONCERNS Current Concerns  Post-Acute Placement   Other Concerns:   none.    SOCIAL WORK ASSESSMENT / PLAN CSW received referral for possible SNF placement at time of discharge. CSW met with pt and pt's family at bedside to discuss discharge disposition. Pt stated she is agreeable to SNF placement at time of discharge and family preferred [facility]. CSW to continue to follow and assist with discharge planning needs.   Assessment/plan status:  Psychosocial Support/Ongoing Assessment of Needs Other assessment/ plan:   none.   Information/referral to community resources:   [facility] bed offers.    PATIENT'S/FAMILY'S RESPONSE TO PLAN OF CARE: Pt understanding and agreeable to CSW plan of care. Pt expressed no further questions or concerns.       Ana Franco, [place] ([phone]) Licensed Clinical Social Worker Orthopedics [phone]) and Surgical [phone])

## 2014-06-14 NOTE — Progress Notes (Signed)
Subjective: 1 Day Post-Op Procedure(s) (LRB): OPEN REDUCTION INTERNAL FIXATION (ORIF) BIMALLEOLAR ANKLE FRACTURE (Right) Patient reports pain as 1 on 0-10 scale.  No nausea/vomiting, lightheadedness/dizziness.  Patient tolerating diet.  Objective: Vital signs in last 24 hours: Temp:  [97.6 F (36.4 C)-98.7 F (37.1 C)] 97.8 F (36.6 C) (11/03 0529) Pulse Rate:  [55-74] 74 (11/03 0529) Resp:  [13-18] 18 (11/03 0529) BP: (124-158)/(44-81) 148/57 mmHg (11/03 0529) SpO2:  [92 %-99 %] 95 % (11/03 0529)  Intake/Output from previous day: 11/02 0701 - 11/03 0700 In: 1213.3 [I.V.:1213.3] Out: 2300 [Urine:2300] Intake/Output this shift:     Recent Labs  06/12/14 1234 06/12/14 2137 06/14/14 0610  HGB 11.5* 10.1* 9.0*    Recent Labs  06/12/14 2137 06/14/14 0610  WBC 12.2* 10.3  RBC 3.28* 2.94*  HCT 30.5* 27.7*  PLT 207 173    Recent Labs  06/12/14 1234 06/12/14 2137  NA 140 139  K 4.2 4.2  CL 102 102  CO2 23 24  BUN 35* 30*  CREATININE 1.09 1.01  GLUCOSE 142* 128*  CALCIUM 9.5 8.8    Recent Labs  06/12/14 1359 06/12/14 2137  INR 0.99 1.11    Neurologically intact Neurovascular intact Sensation intact distally Intact pulses distally Compartment soft  Negative homans LLE Well padded splint in place RLE  Assessment/Plan: 1 Day Post-Op Procedure(s) (LRB): OPEN REDUCTION INTERNAL FIXATION (ORIF) BIMALLEOLAR ANKLE FRACTURE (Right) Advance diet Up with therapy D/C IV fluids  ABLA-stable and asymptomatic.  Will continue to follow Will start abx for pre-op UTI NWB RLE Plan for SNF on Thursday  ANTON, M. Mardella LaymanLINDSEY 06/14/2014, 7:42 AM

## 2014-06-14 NOTE — Plan of Care (Signed)
Problem: Phase I Progression Outcomes Goal: Pain controlled with appropriate interventions Outcome: Completed/Met Date Met:  06/14/14 Goal: Incision/dressings dry and intact Outcome: Completed/Met Date Met:  06/14/14 Goal: Vital signs/hemodynamically stable Outcome: Completed/Met Date Met:  06/14/14

## 2014-06-14 NOTE — Plan of Care (Signed)
Problem: Phase I Progression Outcomes Goal: Voiding-avoid urinary catheter unless indicated Outcome: Completed/Met Date Met:  06/14/14

## 2014-06-14 NOTE — Addendum Note (Signed)
Addendum  created 06/14/14 2130 by Arta BruceKevin Pahoua Schreiner, MD   Modules edited: Anesthesia Blocks and Procedures, Clinical Notes, Haiku Media Capture   Clinical Notes:  File: [id number]   Haiku Media Capture:  DocType:Photo,DocID:[id number]; DocType:Photo,DocID:[id number]

## 2014-06-14 NOTE — Clinical Social Work Placement (Addendum)
Clinical Social Work Department CLINICAL SOCIAL WORK PLACEMENT NOTE 06/14/2014  Patient:  Ana SanesINNIX,[name]  Account Number:  [id number] Admit date:  06/12/2014  Clinical Social Worker:  Mosie EpsteinEMILY S Marlean Mortell, LCSWA  Date/time:  06/14/2014 03:23 PM  Clinical Social Work is seeking post-discharge placement for this patient at the following level of care:   SKILLED NURSING   (*CSW will update this form in Epic as items are completed)   06/14/2014  Patient/family provided with [facility] of Clinical Social Work's list of facilities offering this level of care within the geographic area requested by the patient (or if unable, by the patient's family).  06/14/2014  Patient/family informed of their freedom to choose among providers that offer the needed level of care, that participate in Medicare, Medicaid or managed care program needed by the patient, have an available bed and are willing to accept the patient.  06/14/2014  Patient/family informed of [facility]' ownership interest in [facility], as well as of the fact that they are under no obligation to receive care at this facility.  PASARR submitted to EDS on 06/14/2014 PASARR number received on 06/14/2014  FL2 transmitted to all facilities in geographic area requested by pt/family on  06/14/2014 FL2 transmitted to all facilities within larger geographic area on   Patient informed that his/her managed care company has contracts with or will negotiate with  certain facilities, including the following:     Patient/family informed of bed offers received:  06/15/2014 [facility] Physician recommends and patient chooses bed at    Patient to be transferred to  [facility] on  06/16/2014 Patient to be transferred to facility by [facility] Patient and family notified of transfer on 06/16/2014 Name of family member notified:  Pt notified at bedside.  The following physician request were entered in  Epic:   Additional Comments:  Lily Kochermily Shandrea Lusk, LCSWA [phone]) Licensed Clinical Social Worker Orthopedics [phone]([id number]) and Surgical [phone]([id number])

## 2014-06-14 NOTE — Plan of Care (Signed)
Problem: Phase I Progression Outcomes Goal: OOB as tolerated unless otherwise ordered Outcome: Completed/Met Date Met:  06/14/14     

## 2014-06-14 NOTE — Evaluation (Signed)
Physical Therapy Evaluation Patient Details Name: Ana Franco MRN: [id number] DOB: 08/02/1926 Today's Date: 06/14/2014   History of Present Illness  Larey SeatFell on the way to bathroom with resultant RLE fx now s/p ORIF BIMALLEOLAR ANKLE FRACTURE ; PMHx:none pertinent  Clinical Impression  Pt presents with the below listed impairments and will benefit from skilled PT intervention to address these impairments and increased functional independence with mobility. Recommend short term rehab at SNF as pt lives alone and home is inaccessible at this time due to NWB status. Anticipate pt will be w/c level until WB restrictions change as pt demonstrating difficulty adhering to NWB status.    Follow Up Recommendations SNF    Equipment Recommendations  Other (comment) (TBD at next venue of care)    Recommendations for Other Services       Precautions / Restrictions Precautions Precautions: Fall Restrictions Weight Bearing Restrictions: Yes RLE Weight Bearing: Non weight bearing      Mobility  Bed Mobility Overal bed mobility: Modified Independent             General bed mobility comments: already up in recliner  Transfers Overall transfer level: Needs assistance Equipment used: Rolling walker (2 wheeled) Transfers: Sit to/from Stand Sit to Stand: Mod assist (difficulty maintaining NWB)   Squat pivot transfers: Mod assist;From elevated surface (going to pt's good leg)        Ambulation/Gait Ambulation/Gait assistance: Mod assist Ambulation Distance (Feet): 5 Feet Assistive device: Rolling walker (2 wheeled) Gait Pattern/deviations:  (hop to)     General Gait Details: Cues to push through UE's to maintain NWB status as this was very difficult for pt to adhere to.   Stairs            Wheelchair Mobility    Modified Rankin (Stroke Patients Only)       Balance Overall balance assessment: Needs assistance Sitting-balance support: No upper extremity supported;Feet  supported Sitting balance-Leahy Scale: Fair     Standing balance support: Bilateral upper extremity supported Standing balance-Leahy Scale: Poor                               Pertinent Vitals/Pain Pain Assessment: No/denies pain (denies pain in ankle; reports stomach is sore from shots)    Home Living Family/patient expects to be discharged to:: Skilled nursing facility Living Arrangements: Alone               Additional Comments: 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 Hip and knee WFL; generally deconditioned with decreased muscular endurance       Communication   Communication: No difficulties  Cognition Arousal/Alertness: Awake/alert Behavior During Therapy: WFL for tasks assessed/performed Overall Cognitive Status: Within Functional Limits for tasks assessed                      General Comments General comments (skin integrity, edema, etc.): educated on elevation of RLE for edema control    Exercises General Exercises - Lower Extremity Ankle Circles/Pumps: Left;AROM;10 reps Long Arc Quad: AROM;Strengthening;Both;10 reps (x2  sets)      Assessment/Plan    PT Assessment Patient needs continued PT services  PT Diagnosis Difficulty walking;Generalized weakness   PT Problem List Decreased strength;Decreased range of motion;Decreased activity tolerance;Decreased balance;Decreased mobility;Decreased knowledge of use of DME;Decreased knowledge of precautions;Cardiopulmonary status limiting activity  PT Treatment Interventions DME instruction;Gait training;Functional mobility training;Therapeutic activities;Therapeutic exercise;Balance training;Neuromuscular  re-education;Patient/family education;Wheelchair mobility training;Modalities   PT Goals (Current goals can be found in the Care Plan section) Acute Rehab PT Goals Patient Stated Goal: to go to rehab then home PT Goal Formulation: With patient Time For Goal Achievement: 06/21/14 Potential to Achieve Goals: Good    Frequency Min 3X/week   Barriers to discharge Inaccessible home environment;Decreased caregiver support plan to D/c to SNF as pt lives alone and has stairs to enter the home    Co-evaluation               End of Session Equipment Utilized During Treatment: Gait belt Activity Tolerance: Patient tolerated treatment well Patient left: in chair;with call bell/phone within reach Nurse Communication: Mobility status;Weight bearing status         Time: [id number] PT Time Calculation (min): 24 min   Charges:   PT Evaluation $Initial PT Evaluation Tier I: 1 Procedure PT Treatments $Therapeutic Exercise: 8-22 mins   PT G Codes:          Tedd SiasGray, Nyimah Shadduck Brescia 06/14/2014, 9:17 AM

## 2014-06-15 ENCOUNTER — Encounter (HOSPITAL_COMMUNITY): Payer: Self-pay | Admitting: Orthopedic Surgery

## 2014-06-15 LAB — BASIC METABOLIC PANEL
Anion gap: 11 (ref 5–15)
BUN: 19 mg/dL (ref 6–23)
CO2: 26 mEq/L (ref 19–32)
Calcium: 8.8 mg/dL (ref 8.4–10.5)
Chloride: 103 mEq/L (ref 96–112)
Creatinine, Ser: 0.98 mg/dL (ref 0.50–1.10)
GFR calc Af Amer: 59 mL/min — ABNORMAL LOW (ref >90)
GFR calc non Af Amer: 51 mL/min — ABNORMAL LOW (ref >90)
Glucose, Bld: 103 mg/dL — ABNORMAL HIGH (ref 70–99)
Potassium: 3.9 mEq/L (ref 3.7–5.3)
Sodium: 140 mEq/L (ref 137–147)

## 2014-06-15 LAB — CBC
HCT: 27.5 % — ABNORMAL LOW (ref 36.0–46.0)
Hemoglobin: 9 g/dL — ABNORMAL LOW (ref 12.0–15.0)
MCH: 30.8 pg (ref 26.0–34.0)
MCHC: 32.7 g/dL (ref 30.0–36.0)
MCV: 94.2 fL (ref 78.0–100.0)
Platelets: 168 10*3/uL (ref 150–400)
RBC: 2.92 MIL/uL — ABNORMAL LOW (ref 3.87–5.11)
RDW: 13.6 % (ref 11.5–15.5)
WBC: 10 10*3/uL (ref 4.0–10.5)

## 2014-06-15 LAB — URINE CULTURE: Colony Count: >=100000

## 2014-06-15 MED ORDER — INFLUENZA VAC SPLIT QUAD 0.5 ML IM SUSY
0.5000 mL | PREFILLED_SYRINGE | INTRAMUSCULAR | Status: AC
  Administered 2014-06-16: 0.5 mL via INTRAMUSCULAR
  Filled 2014-06-15: qty 0.5

## 2014-06-15 MED ORDER — ALUM & MAG HYDROXIDE-SIMETH 200-200-20 MG/5ML PO SUSP: 30.0000 mL | Freq: Four times a day (QID) | ORAL | Status: DC | PRN

## 2014-06-15 MED ORDER — SULFAMETHOXAZOLE-TRIMETHOPRIM 400-80 MG PO TABS
1.0000 | ORAL_TABLET | Freq: Two times a day (BID) | ORAL | Status: DC
  Administered 2014-06-15 – 2014-06-16 (×3): 1 via ORAL
  Filled 2014-06-15 (×4): qty 1

## 2014-06-15 MED ORDER — SULFAMETHOXAZOLE-TRIMETHOPRIM 400-80 MG PO TABS: ORAL_TABLET | ORAL | Status: DC

## 2014-06-15 NOTE — Progress Notes (Signed)
Subjective: 2 Days Post-Op Procedure(s) (LRB): OPEN REDUCTION INTERNAL FIXATION (ORIF) BIMALLEOLAR ANKLE FRACTURE (Right) Patient reports pain as 0 on 0-10 scale.  Patient reports mild nausea since yesterday.  No vomiting.  No lightheadedness/dizziness.  Positive flatus but no bm as of yet.  Tolerating diet.   Objective: Vital signs in last 24 hours: Temp:  [97.4 F (36.3 C)-98.3 F (36.8 C)] 98 F (36.7 C) (11/04 0600) Pulse Rate:  [61-68] 61 (11/04 0600) Resp:  [18-20] 18 (11/04 0600) BP: (131-164)/(47-96) 131/96 mmHg (11/04 0600) SpO2:  [95 %-99 %] 97 % (11/04 0600)  Intake/Output from previous day: 11/03 0701 - 11/04 0700 In: 840 [P.O.:840] Out: 625 [Urine:625] Intake/Output this shift:     Recent Labs  06/12/14 1234 06/12/14 2137 06/14/14 0610 06/15/14 0534  HGB 11.5* 10.1* 9.0* 9.0*    Recent Labs  06/14/14 0610 06/15/14 0534  WBC 10.3 10.0  RBC 2.94* 2.92*  HCT 27.7* 27.5*  PLT 173 168    Recent Labs  06/14/14 0610 06/15/14 0534  NA 138 140  K 4.3 3.9  CL 100 103  CO2 25 26  BUN 21 19  CREATININE 0.96 0.98  GLUCOSE 110* 103*  CALCIUM 8.7 8.8    Recent Labs  06/12/14 1359 06/12/14 2137  INR 0.99 1.11    Neurologically intact Neurovascular intact Sensation intact distally Intact pulses distally Compartment soft  Splint in place RLE  Assessment/Plan: 2 Days Post-Op Procedure(s) (LRB): OPEN REDUCTION INTERNAL FIXATION (ORIF) BIMALLEOLAR ANKLE FRACTURE (Right) Advance diet Up with therapy Plan for discharge tomorrow to SNF NWB RLE ABLA-stable and asymptomatic Urine culture came back and antibiotic changed from cipro to bactrim Patient requesting flu shot if she was not [facility] one [facility].  I will put in order if needed.  Ana Franco, Ana Franco 06/15/2014, 7:54 AM

## 2014-06-15 NOTE — Progress Notes (Signed)
Physical Therapy Treatment Patient Details Name: Ana SanesDorothy Franco MRN: [id number] DOB: 1926/05/31 Today's Date: 06/15/2014    History of Present Illness Larey SeatFell on the way to bathroom with resultant RLE fx now s/p ORIF BIMALLEOLAR ANKLE FRACTURE ; PMHx:none pertinent    PT Comments    Patient still very limited by NWBing and inability to maintain with ambulation. She may would benefit from attempting knee walker? Continue to recommend SNF for ongoing Physical Therapy.     Follow Up Recommendations  SNF     Equipment Recommendations   (TBD)    Recommendations for Other Services       Precautions / Restrictions Precautions Precautions: Fall Restrictions RLE Weight Bearing: Non weight bearing    Mobility  Bed Mobility Overal bed mobility: Modified Independent             General bed mobility comments: with use of rails  Transfers Overall transfer level: Needs assistance Equipment used: Rolling walker (2 wheeled)   Sit to Stand: Mod assist         General transfer comment: A to power up into standing. Very difficult time maintain NWB. Stood x4 thorughout session  Ambulation/Gait Ambulation/Gait assistance: Mod assist Ambulation Distance (Feet): 6 Feet Assistive device: Rolling walker (2 wheeled) Gait Pattern/deviations: Step-to pattern     General Gait Details: Cues to push through UE's to maintain NWB status as this was very difficult for pt to adhere to. chair follow to attempt increasing ambulation but unable.    Stairs            Wheelchair Mobility    Modified Rankin (Stroke Patients Only)       Balance                                    Cognition Arousal/Alertness: Awake/alert Behavior During Therapy: WFL for tasks assessed/performed Overall Cognitive Status: Within Functional Limits for tasks assessed                      Exercises      General Comments        Pertinent Vitals/Pain Pain Assessment:  No/denies pain    Home Living                      Prior Function            PT Goals (current goals can now be found in the care plan section) Progress towards PT goals: Progressing toward goals    Frequency  Min 3X/week    PT Plan      Co-evaluation             End of Session Equipment Utilized During Treatment: Gait belt Activity Tolerance: Patient tolerated treatment well Patient left: in chair;with call bell/phone within reach     Time: [id number] PT Time Calculation (min): 21 min  Charges:  $Therapeutic Activity: 8-22 mins                    G Codes:      Fredrich BirksRobinette, Gurdeep Keesey Elizabeth 06/15/2014, 10:06 AM  06/15/2014 Fredrich Birksobinette, Abrham Maslowski Elizabeth PTA [phone] pager [phone] office

## 2014-06-15 NOTE — Op Note (Signed)
NAMDenton Ar:  Normoyle, Caryle              ACCOUNT NO.:  [id number]  MEDICAL RECORD NO.:  [id number]  LOCATION:  [id number]                        FACILITY:  [facility]  PHYSICIAN:  Loreta Aveaniel F. Murphy, M.D. DATE OF BIRTH:  07/19/1926  DATE OF PROCEDURE:  06/13/2014 DATE OF DISCHARGE:                              OPERATIVE REPORT   PREOPERATIVE DIAGNOSIS:  Right ankle grade 1 open bimalleolar ankle fracture with marked displacement.  Status post partial closed reduction.  POSTOPERATIVE DIAGNOSES:  Right ankle grade 1 open bimalleolar ankle fracture with marked displacement.  Status post partial closed reduction with minimal contamination.  Small open laceration distal aspect of the medial malleolus fracture at the tibia.  Marked stretching, thinning, and contusion and injury of the skin on the entire medial side extending up good 2 inches from the open injury.  PROCEDURE:  Right ankle open reduction and internal fixation bimalleolar fracture utilizing Stryker instrumentation.  A fibular plate with 6 screws.  Four proximal nonlocking, 2 distal locking.  Open reduction and internal fixation of medial malleolus fracture with 2 cannulated 4.0 Stryker titanium screws.  Thorough I and D of open injury medially. Approximation of medial soft tissue wound with retention nylon sutures to take stress off the area of soft tissue injury.  SURGEON:  Loreta Aveaniel F. Murphy, M.D.  ASSISTANT:  Rayfield CitizenLindsay Anton, PA., present throughout the entire case and necessary for timely completion of procedure.  ANESTHESIA:  General.  BLOOD LOSS:  Minimal.  SPECIMENS:  None.  CULTURES:  None.  COMPLICATIONS:  None.  DRESSINGS:  Soft compressive. well-padded short-leg splint.  TOURNIQUET TIME:  45 minutes.  PROCEDURE:  The patient was brought to the operating room, placed on the operating table in supine position.  After adequate anesthesia had been obtained, splint removed.  The open injury not contaminated very small but  there was marked stretching and obvious injury to the skin and soft tissue extending good 2 inches up from that injury from inside out pressure from her tibia.  The lateral side intact.  The medial side was opened longitudinally.  We then did a thorough pulse lavage.  I then did a longitudinal approach laterally.  Fibular fracture reduced anatomically, and fixed with a low-profile titanium Stryker plate and screws.  Reasonable bone quality.  Nice solid, stable fixation, and anatomic alignment.  Syndesmosis intact.  Went back medially and fixed the medial malleolus fracture with 2 cannulated 4.0 screws from distal to proximal.  A little bit of bone comminution packed in the fracture site.  This was irrigated once again.  I then closed the surgical incision with nylon and put in large retention nylon sutures over the area of injury to try to take stress off the tissue that had been obviously injured from the underside from her trauma.  I was able to get a nice closure without too much tension.  Overall alignment was confirmed anatomically, visually as well as fluoroscopically a completion.  Sterile compressive dressing applied.  Tourniquet deflated removed.  Short-leg well-padded splint applied.  With Xeroform over the abrasion and tissue injury medially.  Anesthesia reversed.  Brought to the recovery room.  Tolerated the surgery well.  No complications.  Loreta Aveaniel F. Murphy, M.D.     DFM/MEDQ  D:  06/14/2014  T:  06/14/2014  Job:  [id number]

## 2014-06-15 NOTE — Plan of Care (Signed)
Problem: Consults Goal: General Surgical Patient Education (See Patient Education module for education specifics)  Outcome: Completed/Met Date Met:  06/15/14 Goal: Nutrition Consult-if indicated Outcome: Not Applicable Date Met:  47/84/12 Goal: Diabetes Guidelines if Diabetic/Glucose > 140 If diabetic or lab glucose is > 140 mg/dl - Initiate Diabetes/Hyperglycemia Guidelines & Document Interventions  Outcome: Not Applicable Date Met:  82/08/13  Problem: Phase I Progression Outcomes Goal: Tubes/drains patent Outcome: Not Applicable Date Met:  88/71/95 Goal: Initial discharge plan identified Outcome: Completed/Met Date Met:  06/15/14 Goal: Other Phase I Outcomes/Goals Outcome: Completed/Met Date Met:  06/15/14  Problem: Phase II Progression Outcomes Goal: Pain controlled Outcome: Completed/Met Date Met:  06/15/14 Goal: Progress activity as tolerated unless otherwise ordered Outcome: Completed/Met Date Met:  06/15/14 Goal: Progressing with IS, TCDB Outcome: Completed/Met Date Met:  06/15/14 Goal: Vital signs stable Outcome: Completed/Met Date Met:  06/15/14 Goal: Surgical site without signs of infection Outcome: Completed/Met Date Met:  06/15/14 Goal: Dressings dry/intact Outcome: Completed/Met Date Met:  06/15/14 Goal: Foley discontinued Outcome: Not Applicable Date Met:  97/47/18 Goal: Discharge plan established Outcome: Completed/Met Date Met:  06/15/14 Goal: Tolerating diet Outcome: Completed/Met Date Met:  06/15/14 Goal: Other Phase II Outcomes/Goals Outcome: Completed/Met Date Met:  06/15/14  Problem: Phase III Progression Outcomes Goal: Pain controlled on oral analgesia Outcome: Completed/Met Date Met:  06/15/14 Goal: Voiding independently Outcome: Completed/Met Date Met:  06/15/14 Goal: IV changed to normal saline lock Outcome: Completed/Met Date Met:  06/15/14 Goal: Nasogastric tube discontinued Outcome: Not Applicable Date Met:  55/01/58 Goal: Discharge plan  remains appropriate-arrangements made Outcome: Completed/Met Date Met:  06/15/14 Goal: Demonstrates TCDB, IS independently Outcome: Completed/Met Date Met:  06/15/14 Goal: Other Phase III Outcomes/Goals Outcome: Completed/Met Date Met:  06/15/14  Problem: Discharge Progression Outcomes Goal: Barriers To Progression Addressed/Resolved Outcome: Completed/Met Date Met:  06/15/14 Goal: Discharge plan in place and appropriate Outcome: Completed/Met Date Met:  06/15/14 Goal: Pain controlled with appropriate interventions Outcome: Completed/Met Date Met:  06/15/14 Goal: Hemodynamically stable Outcome: Completed/Met Date Met:  68/25/74 Goal: Complications resolved/controlled Outcome: Completed/Met Date Met:  06/15/14 Goal: Tolerating diet Outcome: Completed/Met Date Met:  06/15/14 Goal: Activity appropriate for discharge plan Outcome: Completed/Met Date Met:  06/15/14 Goal: Tubes and drains discontinued if indicated Outcome: Not Applicable Date Met:  93/55/21 Goal: Other Discharge Outcomes/Goals Outcome: Completed/Met Date Met:  06/15/14

## 2014-06-16 DIAGNOSIS — N3000 Acute cystitis without hematuria: Secondary | ICD-10-CM | POA: Diagnosis not present

## 2014-06-16 DIAGNOSIS — K219 Gastro-esophageal reflux disease without esophagitis: Secondary | ICD-10-CM | POA: Diagnosis not present

## 2014-06-16 DIAGNOSIS — I10 Essential (primary) hypertension: Secondary | ICD-10-CM | POA: Diagnosis not present

## 2014-06-16 DIAGNOSIS — S82844D Nondisplaced bimalleolar fracture of right lower leg, subsequent encounter for closed fracture with routine healing: Secondary | ICD-10-CM | POA: Diagnosis not present

## 2014-06-16 DIAGNOSIS — S46001A Unspecified injury of muscle(s) and tendon(s) of the rotator cuff of right shoulder, initial encounter: Secondary | ICD-10-CM | POA: Diagnosis not present

## 2014-06-16 DIAGNOSIS — F329 Major depressive disorder, single episode, unspecified: Secondary | ICD-10-CM | POA: Diagnosis not present

## 2014-06-16 DIAGNOSIS — R001 Bradycardia, unspecified: Secondary | ICD-10-CM | POA: Diagnosis not present

## 2014-06-16 DIAGNOSIS — Z9181 History of falling: Secondary | ICD-10-CM | POA: Diagnosis not present

## 2014-06-16 DIAGNOSIS — S82841B Displaced bimalleolar fracture of right lower leg, initial encounter for open fracture type I or II: Secondary | ICD-10-CM | POA: Diagnosis not present

## 2014-06-16 DIAGNOSIS — R262 Difficulty in walking, not elsewhere classified: Secondary | ICD-10-CM | POA: Diagnosis not present

## 2014-06-16 DIAGNOSIS — N390 Urinary tract infection, site not specified: Secondary | ICD-10-CM | POA: Diagnosis not present

## 2014-06-16 DIAGNOSIS — J188 Other pneumonia, unspecified organism: Secondary | ICD-10-CM | POA: Diagnosis not present

## 2014-06-16 DIAGNOSIS — R278 Other lack of coordination: Secondary | ICD-10-CM | POA: Diagnosis not present

## 2014-06-16 DIAGNOSIS — E785 Hyperlipidemia, unspecified: Secondary | ICD-10-CM | POA: Diagnosis not present

## 2014-06-16 DIAGNOSIS — M21271 Flexion deformity, right ankle and toes: Secondary | ICD-10-CM | POA: Diagnosis not present

## 2014-06-16 DIAGNOSIS — S8290XS Unspecified fracture of unspecified lower leg, sequela: Secondary | ICD-10-CM | POA: Diagnosis not present

## 2014-06-16 DIAGNOSIS — S82841D Displaced bimalleolar fracture of right lower leg, subsequent encounter for closed fracture with routine healing: Secondary | ICD-10-CM | POA: Diagnosis not present

## 2014-06-16 DIAGNOSIS — S82891S Other fracture of right lower leg, sequela: Secondary | ICD-10-CM | POA: Diagnosis not present

## 2014-06-16 DIAGNOSIS — M6281 Muscle weakness (generalized): Secondary | ICD-10-CM | POA: Diagnosis not present

## 2014-06-16 LAB — BASIC METABOLIC PANEL
Anion gap: 12 (ref 5–15)
BUN: 16 mg/dL (ref 6–23)
CO2: 27 mEq/L (ref 19–32)
Calcium: 8.7 mg/dL (ref 8.4–10.5)
Chloride: 101 mEq/L (ref 96–112)
Creatinine, Ser: 0.98 mg/dL (ref 0.50–1.10)
GFR calc Af Amer: 59 mL/min — ABNORMAL LOW (ref >90)
GFR calc non Af Amer: 51 mL/min — ABNORMAL LOW (ref >90)
Glucose, Bld: 103 mg/dL — ABNORMAL HIGH (ref 70–99)
Potassium: 3.8 mEq/L (ref 3.7–5.3)
Sodium: 140 mEq/L (ref 137–147)

## 2014-06-16 LAB — CBC
HCT: 27.9 % — ABNORMAL LOW (ref 36.0–46.0)
Hemoglobin: 9 g/dL — ABNORMAL LOW (ref 12.0–15.0)
MCH: 30.4 pg (ref 26.0–34.0)
MCHC: 32.3 g/dL (ref 30.0–36.0)
MCV: 94.3 fL (ref 78.0–100.0)
Platelets: 176 10*3/uL (ref 150–400)
RBC: 2.96 MIL/uL — ABNORMAL LOW (ref 3.87–5.11)
RDW: 13.6 % (ref 11.5–15.5)
WBC: 9.6 10*3/uL (ref 4.0–10.5)

## 2014-06-16 NOTE — Progress Notes (Signed)
Subjective: 3 Days Post-Op Procedure(s) (LRB): OPEN REDUCTION INTERNAL FIXATION (ORIF) BIMALLEOLAR ANKLE FRACTURE (Right) Patient reports pain as 0 on 0-10 scale.  Nausea resolved after taking maalox/mylanta.  No lightheadedness/dizziness.  Positive flatus and bm.  Tolerating diet and ready to be d/c to snf today.  Objective: Vital signs in last 24 hours: Temp:  [97.4 F (36.3 C)-98.3 F (36.8 C)] 98.1 F (36.7 C) (11/05 0527) Pulse Rate:  [60-68] 68 (11/05 0527) Resp:  [18] 18 (11/05 0527) BP: (131-181)/(59-96) 178/67 mmHg (11/05 0527) SpO2:  [97 %-98 %] 97 % (11/05 0527)  Intake/Output from previous day: 11/04 0701 - 11/05 0700 In: 720 [P.O.:720] Out: -  Intake/Output this shift:     Recent Labs  06/14/14 0610 06/15/14 0534  HGB 9.0* 9.0*    Recent Labs  06/14/14 0610 06/15/14 0534  WBC 10.3 10.0  RBC 2.94* 2.92*  HCT 27.7* 27.5*  PLT 173 168    Recent Labs  06/14/14 0610 06/15/14 0534  NA 138 140  K 4.3 3.9  CL 100 103  CO2 25 26  BUN 21 19  CREATININE 0.96 0.98  GLUCOSE 110* 103*  CALCIUM 8.7 8.8   No results for input(s): LABPT, INR in the last 72 hours.  Neurologically intact Neurovascular intact Sensation intact distally Intact pulses distally Compartment soft  Negative homans LLE  Assessment/Plan: 3 Days Post-Op Procedure(s) (LRB): OPEN REDUCTION INTERNAL FIXATION (ORIF) BIMALLEOLAR ANKLE FRACTURE (Right) Advance diet Up with therapy Discharge to SNF today NWB RLE ABLA-stable  ANTON, M. LINDSEY 06/16/2014, 5:49 AM

## 2014-06-16 NOTE — Care Management Note (Signed)
CARE MANAGEMENT NOTE 06/16/2014  Patient:  Ana Franco,[name]   Account Number:  [id number]  Date Initiated:  06/15/2014  Documentation initiated by:  Ana Franco,Matilde Pottenger  Subjective/Objective Assessment:   80 yr old female admitted s/p fall with right ankle fracture. Patient had a right ankle ORIF.     Action/Plan:   [facility]. W[facility] to [facility]. Social worker is aware.   Anticipated DC Date:  06/16/2014   Anticipated DC Plan:  SKILLED NURSING FACILITY  In-house referral  Clinical Social Worker      DC Planning Services  CM consult      [employer] [facility]  NA   Choice offered to / List presented to:     DME arranged  NA        HH arranged  NA      Status of service:  Completed, signed off Medicare Important Message given?  YES (If response is "NO", the following Medicare IM given date fields will be blank) Date Medicare IM given:  06/16/2014 Medicare IM given by:  Ana Franco,Marian Grandt Date Additional Medicare IM given:   Additional Medicare IM given by:    Discharge Disposition:  SKILLED NURSING FACILITY  Per UR Regulation:  Reviewed for med. necessity/level of care/duration of stay

## 2014-06-16 NOTE — Clinical Social Work Note (Signed)
Pt to be discharged to [facility]. Pt updated regarding discharge at bedside.  [facility]: [phone] Transportation: EMS ([address])  Marcelline Deistmily Tobin Wit[place]i, [place] [phone]) Licensed Clinical Social Worker Orthopedics [phone](5N17-32) and Surgical [phone](6N17-32)

## 2014-06-16 NOTE — Plan of Care (Signed)
Problem: Consults Goal: Skin Care Protocol Initiated - if Braden Score 18 or less If consults are not indicated, leave blank or document N/A  Outcome: Not Applicable Date Met:  75/10/25  Problem: Phase I Progression Outcomes Goal: Sutures/staples intact Outcome: Not Applicable Date Met:  85/27/78  Problem: Phase II Progression Outcomes Goal: Sutures/staples intact Outcome: Not Applicable Date Met:  24/23/53 Goal: Return of bowel function (flatus, BM) IF ABDOMINAL SURGERY:  Outcome: Completed/Met Date Met:  06/16/14  Problem: Phase III Progression Outcomes Goal: Activity at appropriate level-compared to baseline (UP IN CHAIR FOR HEMODIALYSIS)  Outcome: Adequate for Discharge  Problem: Discharge Progression Outcomes Goal: Staples/sutures removed Outcome: Not Applicable Date Met:  61/44/31 Goal: Steri-Strips applied Outcome: Not Applicable Date Met:  54/00/86

## 2014-06-20 ENCOUNTER — Non-Acute Institutional Stay (SKILLED_NURSING_FACILITY): Payer: Medicare Other | Admitting: Adult Health

## 2014-06-20 DIAGNOSIS — I10 Essential (primary) hypertension: Secondary | ICD-10-CM | POA: Diagnosis not present

## 2014-06-20 DIAGNOSIS — K219 Gastro-esophageal reflux disease without esophagitis: Secondary | ICD-10-CM

## 2014-06-20 DIAGNOSIS — F329 Major depressive disorder, single episode, unspecified: Secondary | ICD-10-CM

## 2014-06-20 DIAGNOSIS — E785 Hyperlipidemia, unspecified: Secondary | ICD-10-CM | POA: Diagnosis not present

## 2014-06-20 DIAGNOSIS — N390 Urinary tract infection, site not specified: Secondary | ICD-10-CM

## 2014-06-20 DIAGNOSIS — S82891S Other fracture of right lower leg, sequela: Secondary | ICD-10-CM

## 2014-06-20 DIAGNOSIS — F32A Depression, unspecified: Secondary | ICD-10-CM

## 2014-06-22 ENCOUNTER — Encounter: Payer: Self-pay | Admitting: Adult Health

## 2014-06-22 NOTE — Progress Notes (Signed)
Patient ID: Ana Franco, female   DOB: 1925/08/28, 80 y.o.   MRN: [id number]     No Known Allergies     Chief Complaint  Patient presents with  . Hospitalization Follow-up    HPI:  She has been hospitalized after a fall for a right ankle fracture. She did have an orif to her right ankle and =is here for short term rehab. Her goal is to return back home. She is denying pain at this time and is not voicing other complaints or concerns.     Past Medical History  Diagnosis Date  . Hypertension   . Hyperlipidemia   . GERD (gastroesophageal reflux disease)   . Obesity   . Depression     Past Surgical History  Procedure Laterality Date  . Appendectomy    . Orif ankle fracture Right 06/13/2014    Procedure: OPEN REDUCTION INTERNAL FIXATION (ORIF) BIMALLEOLAR ANKLE FRACTURE;  Surgeon: Loreta Aveaniel F Murphy, MD;  [facility];  Service: Orthopedics;  Laterality: Right;    VITAL SIGNS BP 139/67 mmHg  Pulse 57  Ht 5\' 6"  (1.676 m)  Wt 221 lb (100.245 kg)  BMI 35.69 kg/m2   Outpatient Encounter Prescriptions as of 06/20/2014  Medication Sig  . amLODipine (NORVASC) 5 MG tablet Take 5 mg by mouth daily.  [name]. aspirin EC 325 MG tablet Take 1 tablet (325 mg total) by mouth daily.  [name]. atenolol (TENORMIN) 50 MG tablet Take 50 mg by mouth daily.  . bisacodyl (DULCOLAX) 5 MG EC tablet Take 1 tablet (5 mg total) by mouth daily as needed for moderate constipation.  . citalopram (CELEXA) 20 MG tablet Take 20 mg by mouth daily.  [name]. esomeprazole (NEXIUM) 40 MG capsule Take 40 mg by mouth daily at 12 noon.  . ezetimibe-simvastatin (VYTORIN) 10-40 MG per tablet Take 1 tablet by mouth at bedtime.  [name]. HYDROcodone-acetaminophen (NORCO) 7.5-325 MG per tablet Take 1-2 tablets by mouth every 4 (four) hours as needed for moderate pain.  [name]. losartan-hydrochlorothiazide (HYZAAR) 100-25 MG per tablet Take 1 tablet by mouth daily.  . ondansetron (ZOFRAN) 4 MG tablet Take 1 tablet (4 mg total) by mouth every 8  (eight) hours as needed for nausea or vomiting.  . sulfamethoxazole-trimethoprim (BACTRIM) 400-80 MG per tablet Take one tab q12 hours x 6 days     SIGNIFICANT DIAGNOSTIC EXAMS  06-12-14: right ankle x-ray: Complex ankle fracture with dislocation.  No obvious foot fracture  06-12-14: right ankle x-ray: Bimalleolar fractures with prominent displacement. Tibiotalar joint disruption. Patient is in a splint.  06-12-14: right foot x-ray: 1. Pleural parenchymal scarring left lung base. 2. Cardiomegaly, no CHF.  No acute pulmonary disease.    LABS REVIEWED:   06-12-14: wbc 18.0; hgb 11.5; hct 35.4; mcv 95.2; plt 217; glucose 142; bun 35; creat 1.09; k+4.2; na++140;  06-13-14: urine culture: e-coli: bactrim 06-16-14: wbc 9.6; hgb 9.0; hct 27.9; mcv 94.3; plt 176; glucose 103; bun 19; creat 0.98; k+3.9; na++140    Review of Systems  Constitutional: Negative for malaise/fatigue.  Respiratory: Negative for cough and shortness of breath.   Cardiovascular: Negative for chest pain, palpitations and leg swelling.  Gastrointestinal: Negative for heartburn, abdominal pain and constipation.  Musculoskeletal: Negative for myalgias and joint pain.  Skin: Negative.   Neurological: Negative for headaches.  Psychiatric/Behavioral: Negative for depression. The patient is not nervous/anxious.      Physical Exam  Constitutional: She is oriented to person, place, and time. She appears well-developed and well-nourished. No  distress.  Overweight   Neck: Neck supple. No JVD present. No thyromegaly present.  Cardiovascular: Normal rate, regular rhythm and intact distal pulses.   Respiratory: Effort normal and breath sounds normal. No respiratory distress.  GI: Soft. Bowel sounds are normal. She exhibits no distension. There is no tenderness.  Musculoskeletal: She exhibits no edema.  Able to move all extremities; right ankle in cast   Neurological: She is alert and oriented to person, place, and time.  Skin:  Skin is warm and dry. She is not diaphoretic.       ASSESSMENT/ PLAN:  1. Right ankle fracture: will continue therapy as directed and will follow up orthopedics as indicated. wil continue vicodin 7.5/325 mg 1 or 2 tabs every 4 hours as needed for pain; will continue asa 325 mg daily for 30 days will monitor her status   2. Hypertension: will continue norvasc 5 mg daily; atenolol 50 mg daily; will continue hyzaar 100/25 mg daily   3. Dyslipidemia: will continue vytorin 10/40 mg daily   4. Gerd: will continue nexium 40 mg daily   5. Depression: will continue celexa 20 mg daily states does well with this dose  6. UTI: will complete bactrim and will monitor her status.   Time spent with patient 50 minutes.     Synthia Innocenteborah Green NP [facility]  Contact [phone] Monday through Friday 8am- 5pm  After hours call [phone]

## 2014-06-23 ENCOUNTER — Non-Acute Institutional Stay (SKILLED_NURSING_FACILITY): Payer: Medicare Other | Admitting: Internal Medicine

## 2014-06-23 DIAGNOSIS — N390 Urinary tract infection, site not specified: Secondary | ICD-10-CM | POA: Insufficient documentation

## 2014-06-23 DIAGNOSIS — F32A Depression, unspecified: Secondary | ICD-10-CM | POA: Insufficient documentation

## 2014-06-23 DIAGNOSIS — F329 Major depressive disorder, single episode, unspecified: Secondary | ICD-10-CM

## 2014-06-23 DIAGNOSIS — K219 Gastro-esophageal reflux disease without esophagitis: Secondary | ICD-10-CM

## 2014-06-23 DIAGNOSIS — I10 Essential (primary) hypertension: Secondary | ICD-10-CM

## 2014-06-23 DIAGNOSIS — S82891S Other fracture of right lower leg, sequela: Secondary | ICD-10-CM

## 2014-06-23 DIAGNOSIS — R001 Bradycardia, unspecified: Secondary | ICD-10-CM | POA: Diagnosis not present

## 2014-06-23 DIAGNOSIS — E785 Hyperlipidemia, unspecified: Secondary | ICD-10-CM | POA: Insufficient documentation

## 2014-06-23 LAB — BASIC METABOLIC PANEL
BUN: 24 mg/dL — AB (ref 4–21)
Creatinine: 1.3 mg/dL — AB (ref 0.5–1.1)
Glucose: 105 mg/dL
Potassium: 5.2 mmol/L (ref 3.4–5.3)
Sodium: 137 mmol/L (ref 137–147)

## 2014-06-23 LAB — CBC AND DIFFERENTIAL
HCT: 32 % — AB (ref 36–46)
Hemoglobin: 10.1 g/dL — AB (ref 12.0–16.0)
Platelets: 225 10*3/uL (ref 150–399)
WBC: 12 10^3/mL

## 2014-06-28 DIAGNOSIS — S82841D Displaced bimalleolar fracture of right lower leg, subsequent encounter for closed fracture with routine healing: Secondary | ICD-10-CM | POA: Diagnosis not present

## 2014-07-05 DIAGNOSIS — S82841D Displaced bimalleolar fracture of right lower leg, subsequent encounter for closed fracture with routine healing: Secondary | ICD-10-CM | POA: Diagnosis not present

## 2014-07-09 NOTE — Progress Notes (Signed)
Patient ID: Ana Franco, female   DOB: Dec 01, 1925, 80 y.o.   MRN: [id number]     Facility[facility]    PCP: No primary care provider on file.  Code Status: full code  No Known Allergies  Chief Complaint  Patient presents with  . New Admit To SNF     HPI:  80 y/o female pt is here for STR post hospital admission. She had a fall and sustained right ankle fracture. She underwent ORIF and is here for rehabilitation. She is currently non weight bearing. Her pain is under control with current regimen. She has completed treatment with bactrim for uti and is symptom free currently. She complaints of some nausea this am. She has semi formed stool. Denies vomiting or abdominal pain. On review of her vitals her heart rate has been running low. Denies any cardiac symptoms   Review of Systems:  Constitutional: Negative for fever, chills, HENT: Negative for congestion, sore throat.   Respiratory: Negative for cough, sputum production, shortness of breath and wheezing.   Cardiovascular: Negative for chest pain, palpitations, orthopnea and leg swelling.  Gastrointestinal: Negative for heartburn, vomiting, abdominal pain Genitourinary: Negative for dysuria and flank pain.  Musculoskeletal: Negative for back pain, falls Skin: Negative for itching, rash.  Neurological: Negative for weakness Psychiatric/Behavioral: Negative for depression  Past Medical History  Diagnosis Date  . Hypertension   . Hyperlipidemia   . GERD (gastroesophageal reflux disease)   . Obesity   . Depression    Past Surgical History  Procedure Laterality Date  . Appendectomy    . Orif ankle fracture Right 06/13/2014    Procedure: OPEN REDUCTION INTERNAL FIXATION (ORIF) BIMALLEOLAR ANKLE FRACTURE;  Surgeon: Loreta Aveaniel F Murphy, MD;  [facility];  Service: Orthopedics;  Laterality: Right;   Social History:   reports that she has never smoked. She does not have any smokeless tobacco  history on file. She reports that she does not drink alcohol or use illicit drugs.  No family history on file.  Medications: Patient's Medications  New Prescriptions   No medications on file  Previous Medications   AMLODIPINE (NORVASC) 5 MG TABLET    Take 5 mg by mouth daily.   ASPIRIN EC 325 MG TABLET    Take 1 tablet (325 mg total) by mouth daily.   ATENOLOL (TENORMIN) 50 MG TABLET    Take 50 mg by mouth daily.   BISACODYL (DULCOLAX) 5 MG EC TABLET    Take 1 tablet (5 mg total) by mouth daily as needed for moderate constipation.   CITALOPRAM (CELEXA) 20 MG TABLET    Take 20 mg by mouth daily.   ESOMEPRAZOLE (NEXIUM) 40 MG CAPSULE    Take 40 mg by mouth daily at 12 noon.   EZETIMIBE-SIMVASTATIN (VYTORIN) 10-40 MG PER TABLET    Take 1 tablet by mouth at bedtime.   HYDROCODONE-ACETAMINOPHEN (NORCO) 7.5-325 MG PER TABLET    Take 1-2 tablets by mouth every 4 (four) hours as needed for moderate pain.   LOSARTAN-HYDROCHLOROTHIAZIDE (HYZAAR) 100-25 MG PER TABLET    Take 1 tablet by mouth daily.   ONDANSETRON (ZOFRAN) 4 MG TABLET    Take 1 tablet (4 mg total) by mouth every 8 (eight) hours as needed for nausea or vomiting.   SULFAMETHOXAZOLE-TRIMETHOPRIM (BACTRIM) 400-80 MG PER TABLET    Take one tab q12 hours x 6 days  Modified Medications   No medications on file  Discontinued Medications   No medications on  file     Physical Exam:  Filed Vitals:   06/23/14 1603  BP: 126/78  Pulse: 48  Temp: 98.4 F (36.9 C)  Resp: 16  SpO2: 98%    General- elderly female in no acute distress Head- atraumatic, normocephalic Eyes- PERRLA, EOMI, no pallor, no icterus, no discharge Neck- no cervical lymphadenopathy Cardiovascular- normal s1,s2, no murmurs/ rubs/ gallops, palpable dorsalis pedis on left side Respiratory- bilateral clear to auscultation, no wheeze, no rhonchi, no crackles, no use of accessory muscles Abdomen- bowel sounds present, soft, non tender Musculoskeletal- able to move all  4 extremities,  no leg edema, right ankle in cast Neurological- no focal deficit Skin- warm and dry Psychiatry- normal mood and affect    Labs reviewed: Basic Metabolic Panel:  Recent Labs  82/95/6209/10/24 0610 06/15/14 0534 06/16/14 0500  NA 138 140 140  K 4.3 3.9 3.8  CL 100 103 101  CO2 25 26 27   GLUCOSE 110* 103* 103*  BUN 21 19 16   CREATININE 0.96 0.98 0.98  CALCIUM 8.7 8.8 8.7   Liver Function Tests:  Recent Labs  06/12/14 2137  AST 31  ALT 15  ALKPHOS 72  BILITOT 0.3  PROT 6.3  ALBUMIN 3.4*   No results for input(s): LIPASE, AMYLASE in the last 8760 hours. No results for input(s): AMMONIA in the last 8760 hours. CBC:  Recent Labs  06/12/14 1234 06/12/14 2137 06/14/14 0610 06/15/14 0534 06/16/14 0500  WBC 18.0* 12.2* 10.3 10.0 9.6  NEUTROABS 12.6* 7.0  --   --   --   HGB 11.5* 10.1* 9.0* 9.0* 9.0*  HCT 35.4* 30.5* 27.7* 27.5* 27.9*  MCV 95.2 93.0 94.2 94.2 94.3  PLT 217 207 173 168 176   Radiological Exams: 06-12-14: right ankle x-ray: Complex ankle fracture with dislocation.  No obvious foot fracture  06-12-14: right ankle x-ray: Bimalleolar fractures with prominent displacement. Tibiotalar joint disruption. Patient is in a splint.  06-12-14: right foot x-ray: 1. Pleural parenchymal scarring left lung base. 2. Cardiomegaly, no CHF.  No acute pulmonary disease.     Assessment/Plan  Right ankle fracture S/p ORIF, to follow with ortho, pain under control with current regimen of vicodin, no changes made. Continue aspirin for dvt prophylaxis. NWB in right leg. Will have patient work with PT/OT as tolerated to regain strength and restore function.  Fall precautions are in place.  Bradycardia Persists. Feels tired and has some nausea. On atenolol, bp stable, decrease atenolol to 25 mg daily for now and monitor.  Hypertension Stable. continue norvasc 5 mg daily and hyzaar 100/25 mg daily. Reduced dosing of atenolol. Monitor bp  UTI Asymptomatic,  completed course of bactrim. Monitor clinically  Genella RifeGerd continue nexium 40 mg daily   Depression Stable mood. celexa 20 mg daily    Goals of care: short term rehabilitation   Labs/tests ordered: cbc, cmp    Ana GroutMAHIMA Jaylen Claude, MD  [facility] [phone] (Monday-Friday 8 am - 5 pm) [phone] (afterhours)

## 2014-07-19 ENCOUNTER — Encounter: Payer: Self-pay | Admitting: Registered Nurse

## 2014-07-19 ENCOUNTER — Non-Acute Institutional Stay (SKILLED_NURSING_FACILITY): Payer: Medicare Other | Admitting: Registered Nurse

## 2014-07-19 DIAGNOSIS — K219 Gastro-esophageal reflux disease without esophagitis: Secondary | ICD-10-CM

## 2014-07-19 DIAGNOSIS — S82891S Other fracture of right lower leg, sequela: Secondary | ICD-10-CM

## 2014-07-19 DIAGNOSIS — F32A Depression, unspecified: Secondary | ICD-10-CM

## 2014-07-19 DIAGNOSIS — R001 Bradycardia, unspecified: Secondary | ICD-10-CM | POA: Diagnosis not present

## 2014-07-19 DIAGNOSIS — F329 Major depressive disorder, single episode, unspecified: Secondary | ICD-10-CM

## 2014-07-19 DIAGNOSIS — I10 Essential (primary) hypertension: Secondary | ICD-10-CM | POA: Diagnosis not present

## 2014-07-19 DIAGNOSIS — E785 Hyperlipidemia, unspecified: Secondary | ICD-10-CM | POA: Diagnosis not present

## 2014-07-19 NOTE — Progress Notes (Signed)
Patient ID: Ana SanesDorothy Rosas, female   DOB: [birth year], 80 y.o.   MRN: [id number]   [facility] of Service[facility]  No Known Allergies  Code Status: Full Code  Goals of Care: Longevity/STR  Chief Complaint  Patient presents with  . Medical Management of Chronic Issues    R ankle fx, htn, gerd, depression, bradycardia, HLD    HPI 80 y.o. female with PMH of HTN, HLD, depression, bradycardia, GERD, Right ankle fracture s/p ORIF is being seen for a routine visit for management of her chronic issues. She has lost 12 lbs over the past month, but stated her appetite has not been that good. No recent fall or skin concerns reported. No change in behaviors reported. She reported doing well in rehab s/p ankle fracture. No complaints verbalized by patient. No concerns from staff. Daughter is at bedside. Atenolol dose was reduced last month secondary to symptomatic bradycardia. She is doing well on her current dose. HTN is stable on new atenolol dose along with amlodipine and hyzaar. Depression is stable on celexa. GERD is stale on ppi.   Review of Systems Constitutional: Negative for fever and chills HENT: Negative for ear pain, congestion, and sore throat Eyes: Negative for eye pain, eye discharge, and visual disturbance  Cardiovascular: Negative for chest pain, palpitations, and leg swelling Respiratory: Negative cough, shortness of breath, and wheezing.  Gastrointestinal: Negative for nausea and vomiting. Negative for abdominal pain, diarrhea and constipation.  Genitourinary: Negative for  dysuria, frequency, urgency, and hematuria Musculoskeletal: Negative for back pain, joint pain, and joint swelling  Neurological: Negative for dizziness and headache Skin: Negative for rash and wound.   Psychiatric: Negative for depression  Past Medical History  Diagnosis Date  . Hypertension   . Hyperlipidemia   . GERD (gastroesophageal reflux disease)   . Obesity   . Depression     Past  Surgical History  Procedure Laterality Date  . Appendectomy    . Orif ankle fracture Right 06/13/2014    Procedure: OPEN REDUCTION INTERNAL FIXATION (ORIF) BIMALLEOLAR ANKLE FRACTURE;  Surgeon: Loreta Aveaniel F Murphy, MD;  [facility];  Service: Orthopedics;  Laterality: Right;    History   Social History  . Marital Status: Widowed    Spouse Name: N/A    Number of Children: N/A  . Years of Education: N/A   Occupational History  . Not on file.   Social History Main Topics  . Smoking status: Never Smoker   . Smokeless tobacco: Not on file  . Alcohol Use: No  . Drug Use: No  . Sexual Activity: Not on file   Other Topics Concern  . Not on file   Social History Narrative    No family history on file.    Medication List       This list is accurate as of: 07/19/14  8:42 PM.  Always use your most recent med list.               amLODipine 5 MG tablet  Commonly known as:  NORVASC  Take 5 mg by mouth daily.     aspirin EC 325 MG tablet  Take 1 tablet (325 mg total) by mouth daily.     atenolol 25 MG tablet  Commonly known as:  TENORMIN  Take 25 mg by mouth daily.     bisacodyl 5 MG EC tablet  Commonly known as:  DULCOLAX  Take 1 tablet (5 mg total) by mouth daily as needed for moderate  constipation.     citalopram 20 MG tablet  Commonly known as:  CELEXA  Take 20 mg by mouth daily.     esomeprazole 40 MG capsule  Commonly known as:  NEXIUM  Take 40 mg by mouth daily at 12 noon.     ezetimibe-simvastatin 10-40 MG per tablet  Commonly known as:  VYTORIN  Take 1 tablet by mouth at bedtime.     HYDROcodone-acetaminophen 7.5-325 MG per tablet  Commonly known as:  NORCO  Take 1-2 tablets by mouth every 4 (four) hours as needed for moderate pain.     losartan-hydrochlorothiazide 100-25 MG per tablet  Commonly known as:  HYZAAR  Take 1 tablet by mouth daily.     ondansetron 4 MG tablet  Commonly known as:  ZOFRAN  Take 1 tablet (4 mg total) by mouth every 8  (eight) hours as needed for nausea or vomiting.        Physical Exam  BP 135/63 mmHg  Pulse 58  Temp(Src) 98.1 F (36.7 C)  Resp 20  Ht 5\' 6"  (1.676 m)  Wt 209 lb (94.802 kg)  BMI 33.75 kg/m2  SpO2 91%  Constitutional: WDWN elderly female in no acute distress. Conversant and pleasant with daughter at bedside HEENT: Normocephalic and atraumatic. PERRL. EOM intact. No icterus. No nasal discharge or sinus tenderness. Oral mucosa moist. Posterior pharynx clear of any exudate or lesions. Teeth and gingiva in good general condition.  Neck: Supple and nontender. No lymphadenopathy, masses, or thyromegaly. No JVD or carotid bruits. Cardiac: Normal S1, S2. RRR without appreciable murmurs, rubs, or gallops. Intact distal pulses of LLE. RLE with brisk cap refill Lungs: No respiratory distress. Breath sounds clear bilaterally without rales, rhonchi, or wheezes. Abdomen: Audible bowel sounds in all quadrants. Soft, nontender, nondistended.   Musculoskeletal: able to move all extremities. RLE cast in place Skin: Warm and dry. No rash noted. No erythema.  Neurological: Alert and oriented to person, place, and time.  Psychiatric: Judgment and insight adequate. Appropriate mood and affect.   Labs Reviewed  CBC Latest Ref Rng 06/23/2014 06/16/2014 06/15/2014  WBC - 12.0 9.6 10.0  Hemoglobin 12.0 - 16.0 g/dL 10.1(A) 9.0(L) 9.0(L)  Hematocrit 36 - 46 % 32(A) 27.9(L) 27.5(L)  Platelets 150 - 399 K/L 225 176 168    CMP Latest Ref Rng 06/23/2014 06/16/2014 06/15/2014  Glucose 70 - 99 mg/dL - 409(W) 119(J)  BUN 4 - 21 mg/dL 47(W) 16 19  Creatinine 0.5 - 1.1 mg/dL 1.3(A) 0.98 0.98  Sodium 137 - 147 mmol/L 137 140 140  Potassium 3.4 - 5.3 mmol/L 5.2 3.8 3.9  Chloride 96 - 112 mEq/L - 101 103  CO2 19 - 32 mEq/L - 27 26  Calcium 8.4 - 10.5 mg/dL - 8.7 8.8  Total Protein 6.0 - 8.3 g/dL - - -  Total Bilirubin 0.3 - 1.2 mg/dL - - -  Alkaline Phos 39 - 117 U/L - - -  AST 0 - 37 U/L - - -  ALT 0 - 35  U/L - - -    Assessment & Plan 1. Essential hypertension, benign Stable. Continue atenolol 25mg  daily, norvasc 5mg  daily, and hyzaar 100/25mg  daily. Continue to monitor  2. Gastroesophageal reflux disease, esophagitis presence not specified Stable. Continue nexium 40mg  daily and monitor  3. Depression Stable. Continue celexa 20mg  daily and monitor  4. Ankle fracture, right, sequela Stable. Continue NWB to RLE. Has not required any pain medication. Continue to work with PT/OT for gait/balance/strengh training  to maintain/restore independence with ADLs care. Continue to monitor. F/u with orthopedic surgery  5. Dyslipidemia Continue vytorin 10/40mg  daily and monitor  6. Bradycardia Stable. Asymptomatics since atenolol dose reduction. Continue atenolol 25mg  daily and monitor   Diagnostic Studies/Labs Ordered: CBC with diff  Family/Staff Communication Plan of care discussed with resident and nursing staff. Resident and nursing staff verbalized understanding and agree with plan of care. No additional questions or concerns reported.    Loura BackKim Silvia Markuson, MSN, AGNP-C [facility] [address] [place], [place] [address] [phone] [8am-5pm] After hours: [phone]

## 2014-07-20 LAB — CBC AND DIFFERENTIAL
HCT: 33 % — AB (ref 36–46)
Hemoglobin: 10.3 g/dL — AB (ref 12.0–16.0)
Platelets: 156 10*3/uL (ref 150–399)
WBC: 10.7 10^3/mL

## 2014-07-26 DIAGNOSIS — S82841D Displaced bimalleolar fracture of right lower leg, subsequent encounter for closed fracture with routine healing: Secondary | ICD-10-CM | POA: Diagnosis not present

## 2014-08-01 ENCOUNTER — Non-Acute Institutional Stay (SKILLED_NURSING_FACILITY): Payer: Medicare Other | Admitting: Registered Nurse

## 2014-08-01 ENCOUNTER — Encounter: Payer: Self-pay | Admitting: Registered Nurse

## 2014-08-01 DIAGNOSIS — F32A Depression, unspecified: Secondary | ICD-10-CM

## 2014-08-01 DIAGNOSIS — E785 Hyperlipidemia, unspecified: Secondary | ICD-10-CM

## 2014-08-01 DIAGNOSIS — K219 Gastro-esophageal reflux disease without esophagitis: Secondary | ICD-10-CM

## 2014-08-01 DIAGNOSIS — I10 Essential (primary) hypertension: Secondary | ICD-10-CM | POA: Diagnosis not present

## 2014-08-01 DIAGNOSIS — F329 Major depressive disorder, single episode, unspecified: Secondary | ICD-10-CM

## 2014-08-01 DIAGNOSIS — S82891S Other fracture of right lower leg, sequela: Secondary | ICD-10-CM

## 2014-08-01 NOTE — Progress Notes (Signed)
Patient ID: Ana SanesDorothy Dike, female   DOB: Jul 06, 1926, 80 y.o.   MRN: [id number]   [facility]  No Known Allergies  Code Status: Full Code  Goals of Care: Longevity/STR  Chief Complaint  Patient presents with  . Discharge Note    HPI 80 y.o. female with PMH of HTN, HLD, depression, bradycardia, GERD among others is being seen for a discharge visit. Patient was here for short-term rehabilitation post hospital admission from 06/12/14 to 11/5/5 for right ankle fracture s/p ORIF . Patient has worked with therapy team and is ready to be [facility] with DME [facility] Reported RLE swelling and pain around right ankle, concerning for cellulitis. No other concerns reported.   Review of Systems Constitutional: Negative for fever, chills, and fatigue. HENT: Negative for ear pain, congestion, and sore throat Eyes: Negative for eye pain, eye discharge, and visual disturbance  Cardiovascular: Negative for chest pain, palpitations. Positive for RLE swelling.  Respiratory: Negative cough, shortness of breath, and wheezing.  Gastrointestinal: Negative for nausea and vomiting. Negative for abdominal pain, diarrhea and constipation.  Genitourinary: Negative for  dysuria and hematuria Endocrine: Negative for polydipsia, polyphagia, and polyuria Musculoskeletal: Negative for back pain. Positive for pain around right ankle.  Neurological: Negative for dizziness, headache, weakness, and tremors.  Skin: Negative for rash and wound.   Psychiatric: Negative for nervous/anxious, agitation, depression, and suicidal ideas.   Past Medical History  Diagnosis Date  . Hypertension   . Hyperlipidemia   . GERD (gastroesophageal reflux disease)   . Obesity   . Depression     Past Surgical History  Procedure Laterality Date  . Appendectomy    . Orif ankle fracture Right 06/13/2014    Procedure: OPEN REDUCTION INTERNAL FIXATION (ORIF) BIMALLEOLAR ANKLE  FRACTURE;  Surgeon: Loreta Aveaniel F Murphy, MD;  [facility];  Service: Orthopedics;  Laterality: Right;    History   Social History  . Marital Status: Widowed    Spouse Name: N/A    Number of Children: N/A  . Years of Education: N/A   Occupational History  . Not on file.   Social History Main Topics  . Smoking status: Never Smoker   . Smokeless tobacco: Not on file  . Alcohol Use: No  . Drug Use: No  . Sexual Activity: Not on file   Other Topics Concern  . Not on file   Social History Narrative    No family history on file.    Medication List       This list is accurate as of: 08/01/14 11:59 PM.  Always use your most recent med list.               amLODipine 5 MG tablet  Commonly known as:  NORVASC  Take 5 mg by mouth daily.     aspirin EC 325 MG tablet  Take 1 tablet (325 mg total) by mouth daily.     atenolol 25 MG tablet  Commonly known as:  TENORMIN  Take 25 mg by mouth daily.     bisacodyl 5 MG EC tablet  Commonly known as:  DULCOLAX  Take 1 tablet (5 mg total) by mouth daily as needed for moderate constipation.     citalopram 20 MG tablet  Commonly known as:  CELEXA  Take 20 mg by mouth daily.     esomeprazole 40 MG capsule  Commonly known as:  NEXIUM  Take 40 mg by  mouth daily at 12 noon.     ezetimibe-simvastatin 10-40 MG per tablet  Commonly known as:  VYTORIN  Take 1 tablet by mouth at bedtime.     HYDROcodone-acetaminophen 7.5-325 MG per tablet  Commonly known as:  NORCO  Take 1-2 tablets by mouth every 4 (four) hours as needed for moderate pain.     losartan-hydrochlorothiazide 100-25 MG per tablet  Commonly known as:  HYZAAR  Take 1 tablet by mouth daily.     ondansetron 4 MG tablet  Commonly known as:  ZOFRAN  Take 1 tablet (4 mg total) by mouth every 8 (eight) hours as needed for nausea or vomiting.        Physical Exam  BP 130/60 mmHg  Pulse 77  Temp(Src) 97 F (36.1 C)  Resp 18  Ht 5\' 6"  (1.676 m)  Wt 218 lb 6.4  oz (99.066 kg)  BMI 35.27 kg/m2  Constitutional: WDWN elderly female in no acute distress. Conversant and pleasant HEENT: Normocephalic and atraumatic. PERRL. EOM intact. No icterus.  No nasal discharge or sinus tenderness. Oral mucosa moist. Posterior pharynx clear of any exudate or lesions.  Neck: Supple and nontender. No lymphadenopathy, masses, or thyromegaly. No JVD or carotid bruits. Cardiac: Normal S1, S2. RRR without appreciable murmurs, rubs, or gallops. Distal pulses intact. 2+ pitting edema of RLE.  Lungs: No respiratory distress. Breath sounds clear bilaterally without rales, rhonchi, or wheezes. Abdomen: Audible bowel sounds in all quadrants. Soft, nontender, nondistended. No palpable mass.  Musculoskeletal: able to move all extremities. RLE tender to palpation. Some erythema noted around surgical incisions of right ankle. No signs of infection noted on exam. Skin: Warm and dry. No rash noted. Discoloration of BLE, consistent with venous  Neurological: Alert and oriented to person, place, and time. No focal deficits. Normal reflexes Psychiatric: Judgment and insight adequate. Appropriate mood and affect.   Labs Reviewed  CBC Latest Ref Rng 07/20/2014 06/23/2014 06/16/2014  WBC - 10.7 12.0 9.6  Hemoglobin 12.0 - 16.0 g/dL 10.3(A) 10.1(A) 9.0(L)  Hematocrit 36 - 46 % 33(A) 32(A) 27.9(L)  Platelets 150 - 399 K/L 156 225 176    CMP Latest Ref Rng 06/23/2014 06/16/2014 06/15/2014  Glucose 70 - 99 mg/dL - 098(J103(H) 191(Y103(H)  BUN 4 - 21 mg/dL 78(G24(A) 16 19  Creatinine 0.5 - 1.1 mg/dL 1.3(A) 0.98 0.98  Sodium 137 - 147 mmol/L 137 140 140  Potassium 3.4 - 5.3 mmol/L 5.2 3.8 3.9  Chloride 96 - 112 mEq/L - 101 103  CO2 19 - 32 mEq/L - 27 26  Calcium 8.4 - 10.5 mg/dL - 8.7 8.8  Total Protein 6.0 - 8.3 g/dL - - -  Total Bilirubin 0.3 - 1.2 mg/dL - - -  Alkaline Phos 39 - 117 U/L - - -  AST 0 - 37 U/L - - -  ALT 0 - 35 U/L - - -   Assessment & Plan 1. Essential hypertension,  benign Stable. Continue atenolol 25mg  daily, amlodipine 5mg  daily, and hyzaar 100/25mg  daily.   2. Gastroesophageal reflux disease, esophagitis presence not specified Continue nexium 40mg  daily   3. Dyslipidemia Continue vytorin 10/40mg  daily  4. Depression Continue celexa 20mg  daily  5. Ankle fracture, right, sequela Stable. Continue NWB to RLE and wearing RLE boot. Continue HH PT/OT for gait/balance/training to restore/maximize functional capacity. [facility] assistance with ADLs. Continue norco 7.5/325mg  every four hours as needed for pain.   6. RLE swelling Mostly like related to  postop and venous insufficiency. No signs of infections noted on exam. Encourage elevation of RLE and compression stockings. Avoid prolonged standing/sitting.    Home health services: PT/OT & HH aide DME required: [facility] PCP follow-up: Dr. Guerry Bruin on 08/15/14 at 12:00pm 30-day supply of prescription medications provided. (#30 norco 7.5/325mg , #10 zofran)  Time spent: 35 minutes on care coordination   Family/Staff Communication Plan of care discussed with patient and nursing staff. Patient and nursing staff verbalized understanding and agree with plan of care. No additional questions or concerns reported.    Loura Back, MSN, AGNP-C [facility] [address] [place], [place] [address] [phone] [8am-5pm] After hours: [phone]

## 2014-08-08 DIAGNOSIS — I10 Essential (primary) hypertension: Secondary | ICD-10-CM | POA: Diagnosis not present

## 2014-08-08 DIAGNOSIS — F329 Major depressive disorder, single episode, unspecified: Secondary | ICD-10-CM | POA: Diagnosis not present

## 2014-08-08 DIAGNOSIS — Z9181 History of falling: Secondary | ICD-10-CM | POA: Diagnosis not present

## 2014-08-08 DIAGNOSIS — S8291XD Unspecified fracture of right lower leg, subsequent encounter for closed fracture with routine healing: Secondary | ICD-10-CM | POA: Diagnosis not present

## 2014-08-09 DIAGNOSIS — Z9181 History of falling: Secondary | ICD-10-CM | POA: Diagnosis not present

## 2014-08-09 DIAGNOSIS — F329 Major depressive disorder, single episode, unspecified: Secondary | ICD-10-CM | POA: Diagnosis not present

## 2014-08-09 DIAGNOSIS — I10 Essential (primary) hypertension: Secondary | ICD-10-CM | POA: Diagnosis not present

## 2014-08-09 DIAGNOSIS — S8291XD Unspecified fracture of right lower leg, subsequent encounter for closed fracture with routine healing: Secondary | ICD-10-CM | POA: Diagnosis not present

## 2014-08-10 DIAGNOSIS — Z9181 History of falling: Secondary | ICD-10-CM | POA: Diagnosis not present

## 2014-08-10 DIAGNOSIS — F329 Major depressive disorder, single episode, unspecified: Secondary | ICD-10-CM | POA: Diagnosis not present

## 2014-08-10 DIAGNOSIS — I10 Essential (primary) hypertension: Secondary | ICD-10-CM | POA: Diagnosis not present

## 2014-08-10 DIAGNOSIS — S8291XD Unspecified fracture of right lower leg, subsequent encounter for closed fracture with routine healing: Secondary | ICD-10-CM | POA: Diagnosis not present

## 2014-08-15 DIAGNOSIS — F329 Major depressive disorder, single episode, unspecified: Secondary | ICD-10-CM | POA: Diagnosis not present

## 2014-08-15 DIAGNOSIS — Z9181 History of falling: Secondary | ICD-10-CM | POA: Diagnosis not present

## 2014-08-15 DIAGNOSIS — I10 Essential (primary) hypertension: Secondary | ICD-10-CM | POA: Diagnosis not present

## 2014-08-15 DIAGNOSIS — S8291XD Unspecified fracture of right lower leg, subsequent encounter for closed fracture with routine healing: Secondary | ICD-10-CM | POA: Diagnosis not present

## 2014-08-16 DIAGNOSIS — Z9181 History of falling: Secondary | ICD-10-CM | POA: Diagnosis not present

## 2014-08-16 DIAGNOSIS — S8291XD Unspecified fracture of right lower leg, subsequent encounter for closed fracture with routine healing: Secondary | ICD-10-CM | POA: Diagnosis not present

## 2014-08-16 DIAGNOSIS — I10 Essential (primary) hypertension: Secondary | ICD-10-CM | POA: Diagnosis not present

## 2014-08-16 DIAGNOSIS — F329 Major depressive disorder, single episode, unspecified: Secondary | ICD-10-CM | POA: Diagnosis not present

## 2014-08-17 DIAGNOSIS — F329 Major depressive disorder, single episode, unspecified: Secondary | ICD-10-CM | POA: Diagnosis not present

## 2014-08-17 DIAGNOSIS — Z9181 History of falling: Secondary | ICD-10-CM | POA: Diagnosis not present

## 2014-08-17 DIAGNOSIS — S8291XD Unspecified fracture of right lower leg, subsequent encounter for closed fracture with routine healing: Secondary | ICD-10-CM | POA: Diagnosis not present

## 2014-08-17 DIAGNOSIS — I10 Essential (primary) hypertension: Secondary | ICD-10-CM | POA: Diagnosis not present

## 2014-08-23 DIAGNOSIS — S82841D Displaced bimalleolar fracture of right lower leg, subsequent encounter for closed fracture with routine healing: Secondary | ICD-10-CM | POA: Diagnosis not present

## 2014-08-24 DIAGNOSIS — I10 Essential (primary) hypertension: Secondary | ICD-10-CM | POA: Diagnosis not present

## 2014-08-24 DIAGNOSIS — S8291XD Unspecified fracture of right lower leg, subsequent encounter for closed fracture with routine healing: Secondary | ICD-10-CM | POA: Diagnosis not present

## 2014-08-24 DIAGNOSIS — F329 Major depressive disorder, single episode, unspecified: Secondary | ICD-10-CM | POA: Diagnosis not present

## 2014-08-24 DIAGNOSIS — Z9181 History of falling: Secondary | ICD-10-CM | POA: Diagnosis not present

## 2014-08-25 ENCOUNTER — Encounter (HOSPITAL_COMMUNITY): Payer: Self-pay | Admitting: Orthopedic Surgery

## 2014-08-26 DIAGNOSIS — Z9181 History of falling: Secondary | ICD-10-CM | POA: Diagnosis not present

## 2014-08-26 DIAGNOSIS — F329 Major depressive disorder, single episode, unspecified: Secondary | ICD-10-CM | POA: Diagnosis not present

## 2014-08-26 DIAGNOSIS — I10 Essential (primary) hypertension: Secondary | ICD-10-CM | POA: Diagnosis not present

## 2014-08-26 DIAGNOSIS — S8291XD Unspecified fracture of right lower leg, subsequent encounter for closed fracture with routine healing: Secondary | ICD-10-CM | POA: Diagnosis not present

## 2014-08-30 DIAGNOSIS — I10 Essential (primary) hypertension: Secondary | ICD-10-CM | POA: Diagnosis not present

## 2014-08-30 DIAGNOSIS — Z9181 History of falling: Secondary | ICD-10-CM | POA: Diagnosis not present

## 2014-08-30 DIAGNOSIS — S8291XD Unspecified fracture of right lower leg, subsequent encounter for closed fracture with routine healing: Secondary | ICD-10-CM | POA: Diagnosis not present

## 2014-08-30 DIAGNOSIS — F329 Major depressive disorder, single episode, unspecified: Secondary | ICD-10-CM | POA: Diagnosis not present

## 2014-09-01 DIAGNOSIS — F329 Major depressive disorder, single episode, unspecified: Secondary | ICD-10-CM | POA: Diagnosis not present

## 2014-09-01 DIAGNOSIS — S8291XD Unspecified fracture of right lower leg, subsequent encounter for closed fracture with routine healing: Secondary | ICD-10-CM | POA: Diagnosis not present

## 2014-09-01 DIAGNOSIS — Z9181 History of falling: Secondary | ICD-10-CM | POA: Diagnosis not present

## 2014-09-01 DIAGNOSIS — I10 Essential (primary) hypertension: Secondary | ICD-10-CM | POA: Diagnosis not present

## 2014-09-06 DIAGNOSIS — I10 Essential (primary) hypertension: Secondary | ICD-10-CM | POA: Diagnosis not present

## 2014-09-06 DIAGNOSIS — S8291XD Unspecified fracture of right lower leg, subsequent encounter for closed fracture with routine healing: Secondary | ICD-10-CM | POA: Diagnosis not present

## 2014-09-06 DIAGNOSIS — Z9181 History of falling: Secondary | ICD-10-CM | POA: Diagnosis not present

## 2014-09-06 DIAGNOSIS — F329 Major depressive disorder, single episode, unspecified: Secondary | ICD-10-CM | POA: Diagnosis not present

## 2014-09-08 DIAGNOSIS — F329 Major depressive disorder, single episode, unspecified: Secondary | ICD-10-CM | POA: Diagnosis not present

## 2014-09-08 DIAGNOSIS — Z9181 History of falling: Secondary | ICD-10-CM | POA: Diagnosis not present

## 2014-09-08 DIAGNOSIS — I10 Essential (primary) hypertension: Secondary | ICD-10-CM | POA: Diagnosis not present

## 2014-09-08 DIAGNOSIS — S8291XD Unspecified fracture of right lower leg, subsequent encounter for closed fracture with routine healing: Secondary | ICD-10-CM | POA: Diagnosis not present

## 2014-09-13 DIAGNOSIS — Z9181 History of falling: Secondary | ICD-10-CM | POA: Diagnosis not present

## 2014-09-13 DIAGNOSIS — S8291XD Unspecified fracture of right lower leg, subsequent encounter for closed fracture with routine healing: Secondary | ICD-10-CM | POA: Diagnosis not present

## 2014-09-13 DIAGNOSIS — F329 Major depressive disorder, single episode, unspecified: Secondary | ICD-10-CM | POA: Diagnosis not present

## 2014-09-13 DIAGNOSIS — I10 Essential (primary) hypertension: Secondary | ICD-10-CM | POA: Diagnosis not present

## 2014-09-15 DIAGNOSIS — Z9181 History of falling: Secondary | ICD-10-CM | POA: Diagnosis not present

## 2014-09-15 DIAGNOSIS — F329 Major depressive disorder, single episode, unspecified: Secondary | ICD-10-CM | POA: Diagnosis not present

## 2014-09-15 DIAGNOSIS — I10 Essential (primary) hypertension: Secondary | ICD-10-CM | POA: Diagnosis not present

## 2014-09-15 DIAGNOSIS — S8291XD Unspecified fracture of right lower leg, subsequent encounter for closed fracture with routine healing: Secondary | ICD-10-CM | POA: Diagnosis not present

## 2014-09-20 DIAGNOSIS — Z9181 History of falling: Secondary | ICD-10-CM | POA: Diagnosis not present

## 2014-09-20 DIAGNOSIS — F329 Major depressive disorder, single episode, unspecified: Secondary | ICD-10-CM | POA: Diagnosis not present

## 2014-09-20 DIAGNOSIS — S8291XD Unspecified fracture of right lower leg, subsequent encounter for closed fracture with routine healing: Secondary | ICD-10-CM | POA: Diagnosis not present

## 2014-09-20 DIAGNOSIS — I10 Essential (primary) hypertension: Secondary | ICD-10-CM | POA: Diagnosis not present

## 2014-09-21 DIAGNOSIS — I10 Essential (primary) hypertension: Secondary | ICD-10-CM | POA: Diagnosis not present

## 2014-09-21 DIAGNOSIS — S8291XD Unspecified fracture of right lower leg, subsequent encounter for closed fracture with routine healing: Secondary | ICD-10-CM | POA: Diagnosis not present

## 2014-09-21 DIAGNOSIS — Z9181 History of falling: Secondary | ICD-10-CM | POA: Diagnosis not present

## 2014-09-21 DIAGNOSIS — F329 Major depressive disorder, single episode, unspecified: Secondary | ICD-10-CM | POA: Diagnosis not present

## 2014-09-23 DIAGNOSIS — S82841D Displaced bimalleolar fracture of right lower leg, subsequent encounter for closed fracture with routine healing: Secondary | ICD-10-CM | POA: Diagnosis not present

## 2014-09-23 DIAGNOSIS — F329 Major depressive disorder, single episode, unspecified: Secondary | ICD-10-CM | POA: Diagnosis not present

## 2014-09-23 DIAGNOSIS — I10 Essential (primary) hypertension: Secondary | ICD-10-CM | POA: Diagnosis not present

## 2014-09-23 DIAGNOSIS — Z9181 History of falling: Secondary | ICD-10-CM | POA: Diagnosis not present

## 2014-09-23 DIAGNOSIS — S8291XD Unspecified fracture of right lower leg, subsequent encounter for closed fracture with routine healing: Secondary | ICD-10-CM | POA: Diagnosis not present

## 2014-09-27 DIAGNOSIS — I10 Essential (primary) hypertension: Secondary | ICD-10-CM | POA: Diagnosis not present

## 2014-09-27 DIAGNOSIS — S8291XD Unspecified fracture of right lower leg, subsequent encounter for closed fracture with routine healing: Secondary | ICD-10-CM | POA: Diagnosis not present

## 2014-09-27 DIAGNOSIS — Z9181 History of falling: Secondary | ICD-10-CM | POA: Diagnosis not present

## 2014-09-27 DIAGNOSIS — F329 Major depressive disorder, single episode, unspecified: Secondary | ICD-10-CM | POA: Diagnosis not present

## 2014-09-29 DIAGNOSIS — I10 Essential (primary) hypertension: Secondary | ICD-10-CM | POA: Diagnosis not present

## 2014-09-29 DIAGNOSIS — Z9181 History of falling: Secondary | ICD-10-CM | POA: Diagnosis not present

## 2014-09-29 DIAGNOSIS — S8291XD Unspecified fracture of right lower leg, subsequent encounter for closed fracture with routine healing: Secondary | ICD-10-CM | POA: Diagnosis not present

## 2014-09-29 DIAGNOSIS — F329 Major depressive disorder, single episode, unspecified: Secondary | ICD-10-CM | POA: Diagnosis not present

## 2014-10-03 DIAGNOSIS — I10 Essential (primary) hypertension: Secondary | ICD-10-CM | POA: Diagnosis not present

## 2014-10-03 DIAGNOSIS — Z9181 History of falling: Secondary | ICD-10-CM | POA: Diagnosis not present

## 2014-10-03 DIAGNOSIS — S8291XD Unspecified fracture of right lower leg, subsequent encounter for closed fracture with routine healing: Secondary | ICD-10-CM | POA: Diagnosis not present

## 2014-10-03 DIAGNOSIS — F329 Major depressive disorder, single episode, unspecified: Secondary | ICD-10-CM | POA: Diagnosis not present

## 2014-10-06 DIAGNOSIS — I10 Essential (primary) hypertension: Secondary | ICD-10-CM | POA: Diagnosis not present

## 2014-10-06 DIAGNOSIS — F329 Major depressive disorder, single episode, unspecified: Secondary | ICD-10-CM | POA: Diagnosis not present

## 2014-10-06 DIAGNOSIS — Z9181 History of falling: Secondary | ICD-10-CM | POA: Diagnosis not present

## 2014-10-06 DIAGNOSIS — S8291XD Unspecified fracture of right lower leg, subsequent encounter for closed fracture with routine healing: Secondary | ICD-10-CM | POA: Diagnosis not present

## 2014-10-07 DIAGNOSIS — I10 Essential (primary) hypertension: Secondary | ICD-10-CM | POA: Diagnosis not present

## 2014-10-07 DIAGNOSIS — F329 Major depressive disorder, single episode, unspecified: Secondary | ICD-10-CM | POA: Diagnosis not present

## 2014-10-07 DIAGNOSIS — S8291XD Unspecified fracture of right lower leg, subsequent encounter for closed fracture with routine healing: Secondary | ICD-10-CM | POA: Diagnosis not present

## 2014-10-07 DIAGNOSIS — Z9181 History of falling: Secondary | ICD-10-CM | POA: Diagnosis not present

## 2014-10-11 DIAGNOSIS — S8291XD Unspecified fracture of right lower leg, subsequent encounter for closed fracture with routine healing: Secondary | ICD-10-CM | POA: Diagnosis not present

## 2014-10-11 DIAGNOSIS — I10 Essential (primary) hypertension: Secondary | ICD-10-CM | POA: Diagnosis not present

## 2014-10-11 DIAGNOSIS — F329 Major depressive disorder, single episode, unspecified: Secondary | ICD-10-CM | POA: Diagnosis not present

## 2014-10-11 DIAGNOSIS — Z9181 History of falling: Secondary | ICD-10-CM | POA: Diagnosis not present

## 2014-10-14 DIAGNOSIS — Z9181 History of falling: Secondary | ICD-10-CM | POA: Diagnosis not present

## 2014-10-14 DIAGNOSIS — I10 Essential (primary) hypertension: Secondary | ICD-10-CM | POA: Diagnosis not present

## 2014-10-14 DIAGNOSIS — S8291XD Unspecified fracture of right lower leg, subsequent encounter for closed fracture with routine healing: Secondary | ICD-10-CM | POA: Diagnosis not present

## 2014-10-14 DIAGNOSIS — F329 Major depressive disorder, single episode, unspecified: Secondary | ICD-10-CM | POA: Diagnosis not present

## 2014-10-18 DIAGNOSIS — F329 Major depressive disorder, single episode, unspecified: Secondary | ICD-10-CM | POA: Diagnosis not present

## 2014-10-18 DIAGNOSIS — I10 Essential (primary) hypertension: Secondary | ICD-10-CM | POA: Diagnosis not present

## 2014-10-18 DIAGNOSIS — S8291XD Unspecified fracture of right lower leg, subsequent encounter for closed fracture with routine healing: Secondary | ICD-10-CM | POA: Diagnosis not present

## 2014-10-18 DIAGNOSIS — Z9181 History of falling: Secondary | ICD-10-CM | POA: Diagnosis not present

## 2014-10-21 DIAGNOSIS — S82841D Displaced bimalleolar fracture of right lower leg, subsequent encounter for closed fracture with routine healing: Secondary | ICD-10-CM | POA: Diagnosis not present

## 2014-10-25 DIAGNOSIS — Z9181 History of falling: Secondary | ICD-10-CM | POA: Diagnosis not present

## 2014-10-25 DIAGNOSIS — F329 Major depressive disorder, single episode, unspecified: Secondary | ICD-10-CM | POA: Diagnosis not present

## 2014-10-25 DIAGNOSIS — I10 Essential (primary) hypertension: Secondary | ICD-10-CM | POA: Diagnosis not present

## 2014-10-25 DIAGNOSIS — S8291XD Unspecified fracture of right lower leg, subsequent encounter for closed fracture with routine healing: Secondary | ICD-10-CM | POA: Diagnosis not present

## 2014-10-26 DIAGNOSIS — I10 Essential (primary) hypertension: Secondary | ICD-10-CM | POA: Diagnosis not present

## 2014-10-26 DIAGNOSIS — K219 Gastro-esophageal reflux disease without esophagitis: Secondary | ICD-10-CM | POA: Diagnosis not present

## 2014-10-26 DIAGNOSIS — Z9889 Other specified postprocedural states: Secondary | ICD-10-CM | POA: Diagnosis not present

## 2014-10-26 DIAGNOSIS — R634 Abnormal weight loss: Secondary | ICD-10-CM | POA: Diagnosis not present

## 2014-10-26 DIAGNOSIS — M810 Age-related osteoporosis without current pathological fracture: Secondary | ICD-10-CM | POA: Diagnosis not present

## 2014-10-26 DIAGNOSIS — D638 Anemia in other chronic diseases classified elsewhere: Secondary | ICD-10-CM | POA: Diagnosis not present

## 2014-10-26 DIAGNOSIS — F331 Major depressive disorder, recurrent, moderate: Secondary | ICD-10-CM | POA: Diagnosis not present

## 2014-10-26 DIAGNOSIS — Z23 Encounter for immunization: Secondary | ICD-10-CM | POA: Diagnosis not present

## 2014-10-26 DIAGNOSIS — E785 Hyperlipidemia, unspecified: Secondary | ICD-10-CM | POA: Diagnosis not present

## 2014-10-26 DIAGNOSIS — I739 Peripheral vascular disease, unspecified: Secondary | ICD-10-CM | POA: Diagnosis not present

## 2014-10-27 DIAGNOSIS — I10 Essential (primary) hypertension: Secondary | ICD-10-CM | POA: Diagnosis not present

## 2014-10-27 DIAGNOSIS — F329 Major depressive disorder, single episode, unspecified: Secondary | ICD-10-CM | POA: Diagnosis not present

## 2014-10-27 DIAGNOSIS — S8291XD Unspecified fracture of right lower leg, subsequent encounter for closed fracture with routine healing: Secondary | ICD-10-CM | POA: Diagnosis not present

## 2014-10-27 DIAGNOSIS — Z9181 History of falling: Secondary | ICD-10-CM | POA: Diagnosis not present

## 2014-11-01 DIAGNOSIS — F329 Major depressive disorder, single episode, unspecified: Secondary | ICD-10-CM | POA: Diagnosis not present

## 2014-11-01 DIAGNOSIS — Z9181 History of falling: Secondary | ICD-10-CM | POA: Diagnosis not present

## 2014-11-01 DIAGNOSIS — S8291XD Unspecified fracture of right lower leg, subsequent encounter for closed fracture with routine healing: Secondary | ICD-10-CM | POA: Diagnosis not present

## 2014-11-01 DIAGNOSIS — I10 Essential (primary) hypertension: Secondary | ICD-10-CM | POA: Diagnosis not present

## 2014-11-03 DIAGNOSIS — F329 Major depressive disorder, single episode, unspecified: Secondary | ICD-10-CM | POA: Diagnosis not present

## 2014-11-03 DIAGNOSIS — S8291XD Unspecified fracture of right lower leg, subsequent encounter for closed fracture with routine healing: Secondary | ICD-10-CM | POA: Diagnosis not present

## 2014-11-03 DIAGNOSIS — I10 Essential (primary) hypertension: Secondary | ICD-10-CM | POA: Diagnosis not present

## 2014-11-03 DIAGNOSIS — Z9181 History of falling: Secondary | ICD-10-CM | POA: Diagnosis not present

## 2015-01-26 DIAGNOSIS — F331 Major depressive disorder, recurrent, moderate: Secondary | ICD-10-CM | POA: Diagnosis not present

## 2015-01-26 DIAGNOSIS — Z1389 Encounter for screening for other disorder: Secondary | ICD-10-CM | POA: Diagnosis not present

## 2015-01-26 DIAGNOSIS — K219 Gastro-esophageal reflux disease without esophagitis: Secondary | ICD-10-CM | POA: Diagnosis not present

## 2015-01-26 DIAGNOSIS — I739 Peripheral vascular disease, unspecified: Secondary | ICD-10-CM | POA: Diagnosis not present

## 2015-01-26 DIAGNOSIS — Z6833 Body mass index (BMI) 33.0-33.9, adult: Secondary | ICD-10-CM | POA: Diagnosis not present

## 2015-01-26 DIAGNOSIS — M5137 Other intervertebral disc degeneration, lumbosacral region: Secondary | ICD-10-CM | POA: Diagnosis not present

## 2015-01-26 DIAGNOSIS — Z9889 Other specified postprocedural states: Secondary | ICD-10-CM | POA: Diagnosis not present

## 2015-01-26 DIAGNOSIS — D638 Anemia in other chronic diseases classified elsewhere: Secondary | ICD-10-CM | POA: Diagnosis not present

## 2015-01-26 DIAGNOSIS — E785 Hyperlipidemia, unspecified: Secondary | ICD-10-CM | POA: Diagnosis not present

## 2015-01-26 DIAGNOSIS — R634 Abnormal weight loss: Secondary | ICD-10-CM | POA: Diagnosis not present

## 2015-01-26 DIAGNOSIS — I10 Essential (primary) hypertension: Secondary | ICD-10-CM | POA: Diagnosis not present

## 2015-01-26 DIAGNOSIS — M810 Age-related osteoporosis without current pathological fracture: Secondary | ICD-10-CM | POA: Diagnosis not present

## 2015-02-07 DIAGNOSIS — M810 Age-related osteoporosis without current pathological fracture: Secondary | ICD-10-CM | POA: Diagnosis not present

## 2015-03-02 DIAGNOSIS — Z6833 Body mass index (BMI) 33.0-33.9, adult: Secondary | ICD-10-CM | POA: Diagnosis not present

## 2015-03-02 DIAGNOSIS — M810 Age-related osteoporosis without current pathological fracture: Secondary | ICD-10-CM | POA: Diagnosis not present

## 2015-03-02 DIAGNOSIS — K219 Gastro-esophageal reflux disease without esophagitis: Secondary | ICD-10-CM | POA: Diagnosis not present

## 2015-04-27 DIAGNOSIS — H524 Presbyopia: Secondary | ICD-10-CM | POA: Diagnosis not present

## 2015-04-27 DIAGNOSIS — H3531 Nonexudative age-related macular degeneration: Secondary | ICD-10-CM | POA: Diagnosis not present

## 2015-04-27 DIAGNOSIS — Z961 Presence of intraocular lens: Secondary | ICD-10-CM | POA: Diagnosis not present

## 2015-06-08 DIAGNOSIS — Z23 Encounter for immunization: Secondary | ICD-10-CM | POA: Diagnosis not present

## 2015-07-31 DIAGNOSIS — D638 Anemia in other chronic diseases classified elsewhere: Secondary | ICD-10-CM | POA: Diagnosis not present

## 2015-07-31 DIAGNOSIS — R634 Abnormal weight loss: Secondary | ICD-10-CM | POA: Diagnosis not present

## 2015-07-31 DIAGNOSIS — K219 Gastro-esophageal reflux disease without esophagitis: Secondary | ICD-10-CM | POA: Diagnosis not present

## 2015-07-31 DIAGNOSIS — F331 Major depressive disorder, recurrent, moderate: Secondary | ICD-10-CM | POA: Diagnosis not present

## 2015-07-31 DIAGNOSIS — I10 Essential (primary) hypertension: Secondary | ICD-10-CM | POA: Diagnosis not present

## 2015-07-31 DIAGNOSIS — M810 Age-related osteoporosis without current pathological fracture: Secondary | ICD-10-CM | POA: Diagnosis not present

## 2015-07-31 DIAGNOSIS — E7800 Pure hypercholesterolemia, unspecified: Secondary | ICD-10-CM | POA: Diagnosis not present

## 2015-07-31 DIAGNOSIS — Z6835 Body mass index (BMI) 35.0-35.9, adult: Secondary | ICD-10-CM | POA: Diagnosis not present

## 2015-07-31 DIAGNOSIS — M5137 Other intervertebral disc degeneration, lumbosacral region: Secondary | ICD-10-CM | POA: Diagnosis not present

## 2015-07-31 DIAGNOSIS — I739 Peripheral vascular disease, unspecified: Secondary | ICD-10-CM | POA: Diagnosis not present

## 2015-07-31 DIAGNOSIS — Z9889 Other specified postprocedural states: Secondary | ICD-10-CM | POA: Diagnosis not present

## 2017-12-15 ENCOUNTER — Ambulatory Visit: Payer: Medicare Other | Admitting: Podiatry

## 2017-12-15 ENCOUNTER — Encounter: Payer: Self-pay | Admitting: Podiatry

## 2017-12-15 VITALS — Resp 16

## 2017-12-15 DIAGNOSIS — M79676 Pain in unspecified toe(s): Secondary | ICD-10-CM

## 2017-12-15 DIAGNOSIS — B351 Tinea unguium: Secondary | ICD-10-CM

## 2017-12-15 DIAGNOSIS — M79609 Pain in unspecified limb: Principal | ICD-10-CM

## 2017-12-15 DIAGNOSIS — L84 Corns and callosities: Secondary | ICD-10-CM | POA: Diagnosis not present

## 2017-12-15 NOTE — Progress Notes (Signed)
   Subjective:    Patient ID: Ana Franco, female    DOB: 18-Aug-1925, 84 y.o.   MRN: [id number]  HPI    Review of Systems  All other systems reviewed and are negative.      Objective:   Physical Exam        Assessment & Plan:

## 2017-12-17 NOTE — Progress Notes (Signed)
Subjective:   Patient ID: Ana Franco, female   DOB: 84 y.o.   MRN: [id number]   HPI Patient presents with pain in the nailbeds 1-5 both feet and lesion that makes it hard for her to walk comfortably secondary to pressure on the bone surface.  Patient does not smoke and likes to be active   Review of Systems  All other systems reviewed and are negative.       Objective:  Physical Exam  Constitutional: She appears well-developed and well-nourished.  Cardiovascular: Intact distal pulses.  Pulmonary/Chest: Effort normal.  Musculoskeletal: Normal range of motion.  Neurological: She is alert.  Skin: Skin is warm.  Nursing note and vitals reviewed.   Neurovascular status was found to be intact with muscle strength found to be adequate range of motion within normal limits.  Patient does have nail disease with thickness yellow brittle debris 1-5 both feet that are painful and is hyperkeratotic lesion sub-both feet that are painful when palpated     Assessment:  Structural changes with severe mycotic nail infection and lesion formation with pain     Plan:  H&P condition reviewed and today debridement accomplished 1-5 both feet and lesion debridement accomplished with no iatrogenic bleeding.  Reappoint for routine care

## 2018-03-05 ENCOUNTER — Telehealth: Payer: Self-pay

## 2018-03-05 NOTE — Telephone Encounter (Signed)
SENT NOTES TO SCHEDULING AND FILED NOTES

## 2018-03-18 ENCOUNTER — Other Ambulatory Visit: Payer: Medicare Other | Admitting: Podiatry

## 2018-03-20 ENCOUNTER — Other Ambulatory Visit: Payer: Self-pay | Admitting: Orthopedic Surgery

## 2018-03-20 DIAGNOSIS — M25572 Pain in left ankle and joints of left foot: Secondary | ICD-10-CM

## 2018-03-23 ENCOUNTER — Ambulatory Visit
Admission: RE | Admit: 2018-03-23 | Discharge: 2018-03-23 | Disposition: A | Discharge status: Home / Self Care | Payer: Medicare Other | Source: Ambulatory Visit | Attending: Orthopedic Surgery | Admitting: Orthopedic Surgery

## 2018-03-23 ENCOUNTER — Other Ambulatory Visit: Payer: Self-pay

## 2018-03-23 DIAGNOSIS — M25572 Pain in left ankle and joints of left foot: Secondary | ICD-10-CM

## 2018-03-25 ENCOUNTER — Other Ambulatory Visit: Payer: Self-pay

## 2018-03-27 ENCOUNTER — Other Ambulatory Visit: Payer: Self-pay

## 2018-03-30 ENCOUNTER — Encounter (HOSPITAL_COMMUNITY): Payer: Self-pay | Admitting: *Deleted

## 2018-03-30 NOTE — Progress Notes (Signed)
Received and placed on chart the following: Cardiology Consult [facility]/2018 [facility].  Rerequested 12 lead ekg tracings and Discharge Summary from 02/19/2018 admission.

## 2018-03-30 NOTE — Progress Notes (Signed)
Requested by fax and left voice mail message from [facility] 12 lead ekg tracings, any cardiac consults and discharge summary from 02/19/2018 admission.   Requested [facility] and current [facility], demographic face sheet be faxedd to [phone].

## 2018-03-30 NOTE — Progress Notes (Signed)
Preop instructions for: Ana Franco                          Date of Birth: Dec 14, 1925                            Date of Procedure:03/31/2018        Doctor: Dr Margarita Ranaimothy Murphy  [facility]0900am per son per [facility]  Report to: Admitting  Procedure: Hardware Removal Left Ankle  Any procedure time changes, MD office will notify you!   Do not eat or drink past midnight the night before your procedure.(To include any tube feedings-must be discontinued)    Take these morning medications only with sips of water.(or give through gastrostomy or feeding tube). Amlodipine ( Norvasc) if takes in am , Citalopram if takes in am. Esomeprazole if takes in am , metoprolol if takes in am,      [facility] contact:  [facility]                 Phone: [phone]                 Health Care POA:  Transportation contact phone#: [facility] [phone] Please send day of procedure:current med list and meds last taken that day, confirm nothing by mouth status from what time, Patient Demographic info( to include DNR status, problem list, allergies)   RN contact name/phone#:  Stanton KidneyDebra or Doren CustardAudrey Marsh at [facility] phone: [phone]                           and Fax #:[phone]  Bring Insurance card and picture ID Leave all jewelry and other valuables at place where living( no metal or rings to be worn) No contact lens Women-no make-up, no lotions,perfumes,powders   Any questions day of procedure,call Short Stay [phone]    Sent from :[facility]                   Phone:[phone]                   Fax:[phone]  Sent by :Cyndia DiverKarla Yavier Snider RN

## 2018-03-30 NOTE — Progress Notes (Signed)
Received and placed on chart current med list and medical history from [facility].  Called and [facility]ested [facility].

## 2018-03-30 NOTE — Anesthesia Preprocedure Evaluation (Addendum)
Anesthesia Evaluation  Patient identified by MRN, date of birth, ID band Patient awake    Reviewed: Allergy & Precautions, NPO status , Patient's Chart, lab work & pertinent test results  Airway Mallampati: II  TM Distance: >3 FB Neck ROM: Full    Dental no notable dental hx. (+) Teeth Intact, Dental Advisory Given   Pulmonary neg pulmonary ROS,    Pulmonary exam normal breath sounds clear to auscultation       Cardiovascular Exercise Tolerance: Good hypertension, Pt. on medications and Pt. on home beta blockers + Past MI and + Peripheral Vascular Disease  Normal cardiovascular exam Rhythm:Regular Rate:Normal     Neuro/Psych negative neurological ROS     GI/Hepatic Neg liver ROS, GERD  ,  Endo/Other  negative endocrine ROS  Renal/GU negative Renal ROS     Musculoskeletal  (+) Arthritis ,   Abdominal (+) + obese,   Peds  Hematology  (+) anemia ,   Anesthesia Other Findings L ankle Hardware removal  Reproductive/Obstetrics                            Anesthesia Physical Anesthesia Plan  ASA: III  Anesthesia Plan: Regional and MAC   Post-op Pain Management:    Induction:   PONV Risk Score and Plan: Treatment may vary due to age or medical condition and Ondansetron  Airway Management Planned: Natural Airway and Nasal Cannula  Additional Equipment:   Intra-op Plan:   Post-operative Plan:   Informed Consent: I have reviewed the patients History and Physical, chart, labs and discussed the procedure including the risks, benefits and alternatives for the proposed anesthesia with the patient or authorized representative who has indicated his/her understanding and acceptance.   Dental advisory given  Plan Discussed with: CRNA  Anesthesia Plan Comments:         Anesthesia Quick Evaluation

## 2018-03-30 NOTE — Progress Notes (Signed)
[facility] copy of med list.  [facility] to [facility].

## 2018-03-31 ENCOUNTER — Inpatient Hospital Stay (HOSPITAL_COMMUNITY): Payer: Medicare Other | Admitting: Anesthesiology

## 2018-03-31 ENCOUNTER — Other Ambulatory Visit: Payer: Self-pay

## 2018-03-31 ENCOUNTER — Observation Stay (HOSPITAL_COMMUNITY)
Admission: RE | Admit: 2018-03-31 | Discharge: 2018-04-01 | Disposition: A | Discharge status: Home / Self Care | Payer: Medicare Other | Source: Ambulatory Visit | Attending: Orthopedic Surgery | Admitting: Orthopedic Surgery

## 2018-03-31 ENCOUNTER — Encounter (HOSPITAL_COMMUNITY): Payer: Self-pay | Admitting: *Deleted

## 2018-03-31 ENCOUNTER — Encounter (HOSPITAL_COMMUNITY)
Admission: RE | Disposition: A | Discharge status: Home / Self Care | Payer: Self-pay | Source: Ambulatory Visit | Attending: Orthopedic Surgery

## 2018-03-31 DIAGNOSIS — M1990 Unspecified osteoarthritis, unspecified site: Secondary | ICD-10-CM | POA: Insufficient documentation

## 2018-03-31 DIAGNOSIS — T8469XA Infection and inflammatory reaction due to internal fixation device of other site, initial encounter: Principal | ICD-10-CM | POA: Insufficient documentation

## 2018-03-31 DIAGNOSIS — B9562 Methicillin resistant Staphylococcus aureus infection as the cause of diseases classified elsewhere: Secondary | ICD-10-CM | POA: Diagnosis not present

## 2018-03-31 DIAGNOSIS — Z6833 Body mass index (BMI) 33.0-33.9, adult: Secondary | ICD-10-CM | POA: Diagnosis not present

## 2018-03-31 DIAGNOSIS — K219 Gastro-esophageal reflux disease without esophagitis: Secondary | ICD-10-CM | POA: Insufficient documentation

## 2018-03-31 DIAGNOSIS — Z1623 Resistance to quinolones and fluoroquinolones: Secondary | ICD-10-CM | POA: Insufficient documentation

## 2018-03-31 DIAGNOSIS — S82892G Other fracture of left lower leg, subsequent encounter for closed fracture with delayed healing: Secondary | ICD-10-CM

## 2018-03-31 DIAGNOSIS — X58XXXA Exposure to other specified factors, initial encounter: Secondary | ICD-10-CM | POA: Diagnosis not present

## 2018-03-31 DIAGNOSIS — I252 Old myocardial infarction: Secondary | ICD-10-CM | POA: Insufficient documentation

## 2018-03-31 DIAGNOSIS — Z1629 Resistance to other single specified antibiotic: Secondary | ICD-10-CM | POA: Insufficient documentation

## 2018-03-31 DIAGNOSIS — E785 Hyperlipidemia, unspecified: Secondary | ICD-10-CM | POA: Insufficient documentation

## 2018-03-31 DIAGNOSIS — Z9181 History of falling: Secondary | ICD-10-CM | POA: Diagnosis not present

## 2018-03-31 DIAGNOSIS — Z7982 Long term (current) use of aspirin: Secondary | ICD-10-CM | POA: Diagnosis not present

## 2018-03-31 DIAGNOSIS — I4891 Unspecified atrial fibrillation: Secondary | ICD-10-CM | POA: Diagnosis not present

## 2018-03-31 DIAGNOSIS — E669 Obesity, unspecified: Secondary | ICD-10-CM | POA: Diagnosis not present

## 2018-03-31 DIAGNOSIS — I10 Essential (primary) hypertension: Secondary | ICD-10-CM | POA: Insufficient documentation

## 2018-03-31 DIAGNOSIS — F329 Major depressive disorder, single episode, unspecified: Secondary | ICD-10-CM | POA: Insufficient documentation

## 2018-03-31 DIAGNOSIS — E639 Nutritional deficiency, unspecified: Secondary | ICD-10-CM | POA: Insufficient documentation

## 2018-03-31 DIAGNOSIS — Z1611 Resistance to penicillins: Secondary | ICD-10-CM | POA: Diagnosis not present

## 2018-03-31 DIAGNOSIS — I872 Venous insufficiency (chronic) (peripheral): Secondary | ICD-10-CM | POA: Insufficient documentation

## 2018-03-31 DIAGNOSIS — D509 Iron deficiency anemia, unspecified: Secondary | ICD-10-CM | POA: Diagnosis not present

## 2018-03-31 DIAGNOSIS — Z79899 Other long term (current) drug therapy: Secondary | ICD-10-CM | POA: Diagnosis not present

## 2018-03-31 DIAGNOSIS — R262 Difficulty in walking, not elsewhere classified: Secondary | ICD-10-CM | POA: Insufficient documentation

## 2018-03-31 DIAGNOSIS — I739 Peripheral vascular disease, unspecified: Secondary | ICD-10-CM | POA: Insufficient documentation

## 2018-03-31 HISTORY — PX: IRRIGATION AND DEBRIDEMENT FOOT: SHX6602

## 2018-03-31 HISTORY — DX: Muscle weakness (generalized): M62.81

## 2018-03-31 HISTORY — DX: Non-pressure chronic ulcer of left ankle with necrosis of muscle: L97.323

## 2018-03-31 HISTORY — DX: Essential (primary) hypertension: I10

## 2018-03-31 HISTORY — DX: Personal history of other medical treatment: Z92.89

## 2018-03-31 HISTORY — DX: Anemia, unspecified: D64.9

## 2018-03-31 HISTORY — PX: HARDWARE REMOVAL: SHX979

## 2018-03-31 HISTORY — DX: Unspecified atrial fibrillation: I48.91

## 2018-03-31 HISTORY — DX: Infection and inflammatory reaction due to other internal orthopedic prosthetic devices, implants and grafts, subsequent encounter: T84.7XXD

## 2018-03-31 HISTORY — DX: Nutritional deficiency, unspecified: E63.9

## 2018-03-31 HISTORY — DX: Iron deficiency: E61.1

## 2018-03-31 HISTORY — DX: Displaced trimalleolar fracture of left lower leg, initial encounter for closed fracture: S82.852A

## 2018-03-31 HISTORY — PX: APPLICATION OF WOUND VAC: SHX5189

## 2018-03-31 HISTORY — DX: Major depressive disorder, single episode, unspecified: F32.9

## 2018-03-31 HISTORY — DX: Unspecified osteoarthritis, unspecified site: M19.90

## 2018-03-31 HISTORY — DX: Other lack of coordination: R27.8

## 2018-03-31 HISTORY — DX: History of falling: Z91.81

## 2018-03-31 HISTORY — DX: Venous insufficiency (chronic) (peripheral): I87.2

## 2018-03-31 HISTORY — DX: Acute myocardial infarction, unspecified: I21.9

## 2018-03-31 HISTORY — DX: Difficulty in walking, not elsewhere classified: R26.2

## 2018-03-31 LAB — BASIC METABOLIC PANEL
Anion gap: 11 (ref 5–15)
BUN: 30 mg/dL — ABNORMAL HIGH (ref 8–23)
CO2: 24 mmol/L (ref 22–32)
Calcium: 9.6 mg/dL (ref 8.9–10.3)
Chloride: 107 mmol/L (ref 98–111)
Creatinine, Ser: 1.05 mg/dL — ABNORMAL HIGH (ref 0.44–1.00)
GFR calc Af Amer: 53 mL/min — ABNORMAL LOW (ref >60)
GFR calc non Af Amer: 45 mL/min — ABNORMAL LOW (ref >60)
Glucose, Bld: 110 mg/dL — ABNORMAL HIGH (ref 70–99)
Potassium: 3.9 mmol/L (ref 3.5–5.1)
Sodium: 142 mmol/L (ref 135–145)

## 2018-03-31 LAB — CBC
HCT: 29.1 % — ABNORMAL LOW (ref 36.0–46.0)
Hemoglobin: 9.1 g/dL — ABNORMAL LOW (ref 12.0–15.0)
MCH: 29.2 pg (ref 26.0–34.0)
MCHC: 31.3 g/dL (ref 30.0–36.0)
MCV: 93.3 fL (ref 78.0–100.0)
Platelets: 439 10*3/uL — ABNORMAL HIGH (ref 150–400)
RBC: 3.12 MIL/uL — ABNORMAL LOW (ref 3.87–5.11)
RDW: 14.5 % (ref 11.5–15.5)
WBC: 16.1 10*3/uL — ABNORMAL HIGH (ref 4.0–10.5)

## 2018-03-31 SURGERY — REMOVAL, HARDWARE
Anesthesia: Monitor Anesthesia Care | Site: Ankle | Laterality: Left

## 2018-03-31 MED ORDER — METHOCARBAMOL 500 MG PO TABS
500.0000 mg | ORAL_TABLET | Freq: Four times a day (QID) | ORAL | Status: DC | PRN
  Administered 2018-03-31: 500 mg via ORAL
  Filled 2018-03-31: qty 1

## 2018-03-31 MED ORDER — CIPROFLOXACIN HCL 500 MG PO TABS
500.0000 mg | ORAL_TABLET | Freq: Two times a day (BID) | ORAL | Status: DC
  Administered 2018-03-31 – 2018-04-01 (×2): 500 mg via ORAL
  Filled 2018-03-31 (×2): qty 1

## 2018-03-31 MED ORDER — EPHEDRINE 5 MG/ML INJ
INTRAVENOUS | Status: AC
  Filled 2018-03-31: qty 10

## 2018-03-31 MED ORDER — EZETIMIBE-SIMVASTATIN 10-40 MG PO TABS
1.0000 | ORAL_TABLET | Freq: Every day | ORAL | Status: DC
  Administered 2018-03-31: 1 via ORAL
  Filled 2018-03-31: qty 1

## 2018-03-31 MED ORDER — CEFAZOLIN SODIUM-DEXTROSE 2-4 GM/100ML-% IV SOLN
2.0000 g | Freq: Four times a day (QID) | INTRAVENOUS | Status: AC
  Administered 2018-03-31 – 2018-04-01 (×3): 2 g via INTRAVENOUS
  Filled 2018-03-31 (×3): qty 100

## 2018-03-31 MED ORDER — ADULT MULTIVITAMIN W/MINERALS CH
1.0000 | ORAL_TABLET | Freq: Every day | ORAL | Status: DC
  Administered 2018-04-01: 1 via ORAL
  Filled 2018-03-31: qty 1

## 2018-03-31 MED ORDER — ACETAMINOPHEN 500 MG PO TABS: 500.0000 mg | ORAL_TABLET | Freq: Every day | ORAL | Status: DC

## 2018-03-31 MED ORDER — PROPOFOL 500 MG/50ML IV EMUL
INTRAVENOUS | Status: DC | PRN
  Administered 2018-03-31: 70 ug/kg/min via INTRAVENOUS

## 2018-03-31 MED ORDER — HYDROCODONE-ACETAMINOPHEN 5-325 MG PO TABS
1.0000 | ORAL_TABLET | ORAL | Status: DC | PRN
  Administered 2018-03-31 – 2018-04-01 (×3): 1 via ORAL
  Filled 2018-03-31 (×3): qty 1

## 2018-03-31 MED ORDER — METOPROLOL TARTRATE 50 MG PO TABS
100.0000 mg | ORAL_TABLET | ORAL | Status: DC
  Administered 2018-04-01: 100 mg via ORAL
  Filled 2018-03-31: qty 2

## 2018-03-31 MED ORDER — FENTANYL CITRATE (PF) 100 MCG/2ML IJ SOLN: 25.0000 ug | INTRAMUSCULAR | Status: DC | PRN

## 2018-03-31 MED ORDER — ACETAMINOPHEN 500 MG PO TABS
500.0000 mg | ORAL_TABLET | Freq: Four times a day (QID) | ORAL | Status: AC
  Administered 2018-03-31 – 2018-04-01 (×4): 500 mg via ORAL
  Filled 2018-03-31 (×4): qty 1

## 2018-03-31 MED ORDER — CALCIUM CITRATE-VITAMIN D 315-200 MG-UNIT PO TABS: 1.0000 | ORAL_TABLET | Freq: Every day | ORAL | Status: DC

## 2018-03-31 MED ORDER — FENTANYL CITRATE (PF) 100 MCG/2ML IJ SOLN
INTRAMUSCULAR | Status: AC
  Administered 2018-03-31: 25 ug via INTRAVENOUS
  Filled 2018-03-31: qty 2

## 2018-03-31 MED ORDER — LACTATED RINGERS IV SOLN
INTRAVENOUS | Status: DC
  Administered 2018-04-01: 03:00:00 via INTRAVENOUS

## 2018-03-31 MED ORDER — ACETAMINOPHEN 500 MG PO TABS
1000.0000 mg | ORAL_TABLET | Freq: Once | ORAL | Status: AC
  Administered 2018-03-31: 1000 mg via ORAL
  Filled 2018-03-31: qty 2

## 2018-03-31 MED ORDER — MORPHINE SULFATE (PF) 2 MG/ML IV SOLN
0.5000 mg | INTRAVENOUS | Status: DC | PRN
  Administered 2018-03-31: 0.5 mg via INTRAVENOUS
  Filled 2018-03-31: qty 1

## 2018-03-31 MED ORDER — ACETAMINOPHEN 325 MG PO TABS: 325.0000 mg | ORAL_TABLET | Freq: Four times a day (QID) | ORAL | Status: DC | PRN

## 2018-03-31 MED ORDER — ONDANSETRON HCL 4 MG/2ML IJ SOLN
INTRAMUSCULAR | Status: DC | PRN
  Administered 2018-03-31: 4 mg via INTRAVENOUS

## 2018-03-31 MED ORDER — HYDROCODONE-ACETAMINOPHEN 5-325 MG PO TABS: 1.0000 | ORAL_TABLET | Freq: Four times a day (QID) | ORAL | 0 refills | Status: DC | PRN

## 2018-03-31 MED ORDER — POLYETHYLENE GLYCOL 3350 17 G PO PACK: 17.0000 g | PACK | Freq: Every day | ORAL | Status: DC | PRN

## 2018-03-31 MED ORDER — VITAMIN B-12 1000 MCG PO TABS
1000.0000 ug | ORAL_TABLET | Freq: Every day | ORAL | Status: DC
  Administered 2018-04-01: 1000 ug via ORAL
  Filled 2018-03-31: qty 1

## 2018-03-31 MED ORDER — ONDANSETRON HCL 4 MG PO TABS: 4.0000 mg | ORAL_TABLET | Freq: Three times a day (TID) | ORAL | 0 refills | Status: DC | PRN

## 2018-03-31 MED ORDER — 0.9 % SODIUM CHLORIDE (POUR BTL) OPTIME
TOPICAL | Status: DC | PRN
  Administered 2018-03-31: 1000 mL

## 2018-03-31 MED ORDER — CITALOPRAM HYDROBROMIDE 20 MG PO TABS
20.0000 mg | ORAL_TABLET | Freq: Every day | ORAL | Status: DC
  Administered 2018-04-01: 20 mg via ORAL
  Filled 2018-03-31: qty 1

## 2018-03-31 MED ORDER — CALCIUM CARBONATE-VITAMIN D 500-200 MG-UNIT PO TABS
1.0000 | ORAL_TABLET | Freq: Every day | ORAL | Status: DC
  Administered 2018-04-01: 1 via ORAL
  Filled 2018-03-31: qty 1

## 2018-03-31 MED ORDER — FERROUS SULFATE 325 (65 FE) MG PO TABS
325.0000 mg | ORAL_TABLET | Freq: Two times a day (BID) | ORAL | Status: DC
  Administered 2018-04-01: 325 mg via ORAL
  Filled 2018-03-31: qty 1

## 2018-03-31 MED ORDER — ONDANSETRON HCL 4 MG/2ML IJ SOLN
INTRAMUSCULAR | Status: AC
  Filled 2018-03-31: qty 2

## 2018-03-31 MED ORDER — ENOXAPARIN SODIUM 40 MG/0.4ML ~~LOC~~ SOLN
40.0000 mg | SUBCUTANEOUS | Status: DC
  Administered 2018-04-01: 40 mg via SUBCUTANEOUS
  Filled 2018-03-31: qty 0.4

## 2018-03-31 MED ORDER — ROPIVACAINE HCL 5 MG/ML IJ SOLN
INTRAMUSCULAR | Status: DC | PRN
  Administered 2018-03-31: 30 mL via PERINEURAL

## 2018-03-31 MED ORDER — ACETAMINOPHEN 10 MG/ML IV SOLN: 1000.0000 mg | Freq: Once | INTRAVENOUS | Status: DC | PRN

## 2018-03-31 MED ORDER — FENTANYL CITRATE (PF) 100 MCG/2ML IJ SOLN
INTRAMUSCULAR | Status: AC
  Filled 2018-03-31: qty 2

## 2018-03-31 MED ORDER — ONDANSETRON HCL 4 MG/2ML IJ SOLN: 4.0000 mg | Freq: Once | INTRAMUSCULAR | Status: DC | PRN

## 2018-03-31 MED ORDER — ASPIRIN EC 325 MG PO TBEC
325.0000 mg | DELAYED_RELEASE_TABLET | Freq: Every day | ORAL | Status: DC
  Administered 2018-04-01: 325 mg via ORAL
  Filled 2018-03-31: qty 1

## 2018-03-31 MED ORDER — FENTANYL CITRATE (PF) 100 MCG/2ML IJ SOLN
INTRAMUSCULAR | Status: DC | PRN
  Administered 2018-03-31 (×2): 25 ug via INTRAVENOUS

## 2018-03-31 MED ORDER — CHLORHEXIDINE GLUCONATE 4 % EX LIQD: 60.0000 mL | Freq: Once | CUTANEOUS | Status: DC

## 2018-03-31 MED ORDER — DECUBI-VITE PO CAPS: 1.0000 | ORAL_CAPSULE | Freq: Every day | ORAL | Status: DC

## 2018-03-31 MED ORDER — EPHEDRINE SULFATE-NACL 50-0.9 MG/10ML-% IV SOSY
PREFILLED_SYRINGE | INTRAVENOUS | Status: DC | PRN
  Administered 2018-03-31: 5 mg via INTRAVENOUS

## 2018-03-31 MED ORDER — DOCUSATE SODIUM 100 MG PO CAPS
100.0000 mg | ORAL_CAPSULE | Freq: Two times a day (BID) | ORAL | Status: DC
  Administered 2018-03-31 – 2018-04-01 (×2): 100 mg via ORAL
  Filled 2018-03-31 (×2): qty 1

## 2018-03-31 MED ORDER — ONDANSETRON HCL 4 MG PO TABS: 4.0000 mg | ORAL_TABLET | Freq: Four times a day (QID) | ORAL | Status: DC | PRN

## 2018-03-31 MED ORDER — METHOCARBAMOL 500 MG IVPB - SIMPLE MED
INTRAVENOUS | Status: AC
  Filled 2018-03-31: qty 50

## 2018-03-31 MED ORDER — PROSIGHT PO TABS
1.0000 | ORAL_TABLET | Freq: Every day | ORAL | Status: DC
  Administered 2018-04-01: 1 via ORAL
  Filled 2018-03-31: qty 1

## 2018-03-31 MED ORDER — LACTATED RINGERS IV SOLN
INTRAVENOUS | Status: DC
  Administered 2018-03-31: 10:00:00 via INTRAVENOUS

## 2018-03-31 MED ORDER — PANTOPRAZOLE SODIUM 40 MG PO TBEC
40.0000 mg | DELAYED_RELEASE_TABLET | Freq: Every day | ORAL | Status: DC
  Administered 2018-04-01: 40 mg via ORAL
  Filled 2018-03-31: qty 1

## 2018-03-31 MED ORDER — DOXYCYCLINE HYCLATE 50 MG PO CAPS: 100.0000 mg | ORAL_CAPSULE | Freq: Two times a day (BID) | ORAL | 0 refills | Status: DC

## 2018-03-31 MED ORDER — METOPROLOL TARTRATE 25 MG PO TABS
25.0000 mg | ORAL_TABLET | Freq: Every evening | ORAL | Status: DC
  Administered 2018-03-31: 25 mg via ORAL
  Filled 2018-03-31: qty 1

## 2018-03-31 MED ORDER — AMLODIPINE BESYLATE 5 MG PO TABS
5.0000 mg | ORAL_TABLET | Freq: Every day | ORAL | Status: DC
  Administered 2018-04-01: 5 mg via ORAL
  Filled 2018-03-31: qty 1

## 2018-03-31 MED ORDER — METOCLOPRAMIDE HCL 5 MG PO TABS: 5.0000 mg | ORAL_TABLET | Freq: Three times a day (TID) | ORAL | Status: DC | PRN

## 2018-03-31 MED ORDER — PROPOFOL 10 MG/ML IV BOLUS
INTRAVENOUS | Status: AC
  Filled 2018-03-31: qty 40

## 2018-03-31 MED ORDER — METOCLOPRAMIDE HCL 5 MG/ML IJ SOLN: 5.0000 mg | Freq: Three times a day (TID) | INTRAMUSCULAR | Status: DC | PRN

## 2018-03-31 MED ORDER — FENTANYL CITRATE (PF) 100 MCG/2ML IJ SOLN
50.0000 ug | INTRAMUSCULAR | Status: DC
  Administered 2018-03-31: 25 ug via INTRAVENOUS

## 2018-03-31 MED ORDER — ONDANSETRON HCL 4 MG/2ML IJ SOLN: 4.0000 mg | Freq: Four times a day (QID) | INTRAMUSCULAR | Status: DC | PRN

## 2018-03-31 MED ORDER — CEFAZOLIN SODIUM-DEXTROSE 2-4 GM/100ML-% IV SOLN
2.0000 g | INTRAVENOUS | Status: AC
  Administered 2018-03-31: 2 g via INTRAVENOUS
  Filled 2018-03-31: qty 100

## 2018-03-31 MED ORDER — METHOCARBAMOL 500 MG IVPB - SIMPLE MED
500.0000 mg | Freq: Four times a day (QID) | INTRAVENOUS | Status: DC | PRN
  Administered 2018-03-31: 500 mg via INTRAVENOUS
  Filled 2018-03-31: qty 50

## 2018-03-31 MED ORDER — NITROGLYCERIN 0.4 MG SL SUBL: 0.4000 mg | SUBLINGUAL_TABLET | SUBLINGUAL | Status: DC | PRN

## 2018-03-31 SURGICAL SUPPLY — 44 items
BAG SPEC THK2 15X12 ZIP CLS (MISCELLANEOUS) ×1
BAG ZIPLOCK 12X15 (MISCELLANEOUS) ×2 IMPLANT
BANDAGE ACE 4X5 VEL STRL LF (GAUZE/BANDAGES/DRESSINGS) ×1 IMPLANT
BANDAGE ACE 6X5 VEL STRL LF (GAUZE/BANDAGES/DRESSINGS) ×2 IMPLANT
BANDAGE ESMARK 6X9 LF (GAUZE/BANDAGES/DRESSINGS) ×1 IMPLANT
BNDG CMPR 9X6 STRL LF SNTH (GAUZE/BANDAGES/DRESSINGS) ×1
BNDG ESMARK 6X9 LF (GAUZE/BANDAGES/DRESSINGS) ×2
COVER MAYO STAND STRL (DRAPES) ×1 IMPLANT
COVER SURGICAL LIGHT HANDLE (MISCELLANEOUS) ×2 IMPLANT
CUFF TOURN SGL QUICK 34 (TOURNIQUET CUFF) ×2
CUFF TRNQT CYL 34X4X40X1 (TOURNIQUET CUFF) ×1 IMPLANT
DRAPE EXTREMITY T 121X128X90 (DRAPE) ×1 IMPLANT
DRAPE OEC MINIVIEW 54X84 (DRAPES) IMPLANT
DRAPE POUCH INSTRU U-SHP 10X18 (DRAPES) ×2 IMPLANT
DRAPE STERI IOBAN 125X83 (DRAPES) ×2 IMPLANT
DRAPE U-SHAPE 47X51 STRL (DRAPES) ×1 IMPLANT
DRSG EMULSION OIL 3X16 NADH (GAUZE/BANDAGES/DRESSINGS) ×2 IMPLANT
DRSG PAD ABDOMINAL 8X10 ST (GAUZE/BANDAGES/DRESSINGS) ×2 IMPLANT
ELECT REM PT RETURN 15FT ADLT (MISCELLANEOUS) ×2 IMPLANT
GAUZE SPONGE 4X4 12PLY STRL (GAUZE/BANDAGES/DRESSINGS) ×2 IMPLANT
GAUZE XEROFORM 5X9 LF (GAUZE/BANDAGES/DRESSINGS) ×1 IMPLANT
GLOVE BIOGEL PI IND STRL 7.0 (GLOVE) IMPLANT
GLOVE BIOGEL PI IND STRL 7.5 (GLOVE) ×1 IMPLANT
GLOVE BIOGEL PI IND STRL 8.5 (GLOVE) ×1 IMPLANT
GLOVE BIOGEL PI INDICATOR 7.0 (GLOVE) ×2
GLOVE BIOGEL PI INDICATOR 7.5 (GLOVE) ×4
GLOVE BIOGEL PI INDICATOR 8.5 (GLOVE) ×2
GLOVE ECLIPSE 8.0 STRL XLNG CF (GLOVE) ×2 IMPLANT
GOWN STRL REUS W/TWL LRG LVL3 (GOWN DISPOSABLE) ×2 IMPLANT
GOWN STRL REUS W/TWL XL LVL3 (GOWN DISPOSABLE) ×5 IMPLANT
KIT BASIN OR (CUSTOM PROCEDURE TRAY) ×2 IMPLANT
KIT DRSG PREVENA PLUS 7DAY 125 (MISCELLANEOUS) ×1 IMPLANT
KIT PREVENA INCISION MGT20CM45 (CANNISTER) ×1 IMPLANT
MANIFOLD NEPTUNE II (INSTRUMENTS) ×2 IMPLANT
PACK TOTAL JOINT (CUSTOM PROCEDURE TRAY) ×2 IMPLANT
PAD ABD 8X10 STRL (GAUZE/BANDAGES/DRESSINGS) ×1 IMPLANT
POSITIONER SURGICAL ARM (MISCELLANEOUS) ×2 IMPLANT
SUT ETHILON 2 0 PS N (SUTURE) ×2 IMPLANT
SUT ETHILON 3 0 PS 1 (SUTURE) ×2 IMPLANT
SUT MNCRL AB 4-0 PS2 18 (SUTURE) IMPLANT
SUT VIC AB 1 CT1 36 (SUTURE) ×3 IMPLANT
SUT VIC AB 2-0 CT1 27 (SUTURE)
SUT VIC AB 2-0 CT1 TAPERPNT 27 (SUTURE) ×1 IMPLANT
TOWEL OR 17X26 10 PK STRL BLUE (TOWEL DISPOSABLE) ×4 IMPLANT

## 2018-03-31 NOTE — Anesthesia Procedure Notes (Signed)
Anesthesia Regional Block: Popliteal block   Pre-Anesthetic Checklist: ,, timeout performed, Correct Patient, Correct Site, Correct Laterality, Correct Procedure, Correct Position, site marked, Risks and benefits discussed, pre-op evaluation,  At surgeon's request and post-op pain management  Laterality: Left  Prep: Maximum Sterile Barrier Precautions used, chloraprep       Needles:  Injection technique: Single-shot  Needle Type: Echogenic Needle     Needle Length: 9cm  Needle Gauge: 21     Additional Needles:   Procedures:,,,, ultrasound used (permanent image in chart),,,,  Narrative:  Start time: 03/31/2018 10:53 AM End time: 03/31/2018 11:04 AM Injection made incrementally with aspirations every 5 mL. Anesthesiologist: Trevor IhaHouser, Natalyia Innes A, MD

## 2018-03-31 NOTE — Op Note (Signed)
03/31/2018  12:44 PM  PATIENT:  [name]    PRE-OPERATIVE DIAGNOSIS:  LEFT ANKLE INFECTED HARDWARE  POST-OPERATIVE DIAGNOSIS:  Same  PROCEDURE:  HARDWARE REMOVAL  SURGEON:  Renette Butters, MD  ASSISTANT: Roxan Hockey, PA-C, he was present and scrubbed throughout the case, critical for completion in a timely fashion, and for retraction, instrumentation, and closure.   ANESTHESIA:   MAC/block  PREOPERATIVE INDICATIONS:  Ana Franco is a  84 y.o. female with a diagnosis of LEFT ANKLE INFECTED HARDWARE who failed conservative measures and elected for surgical management.    The risks benefits and alternatives were discussed with the patient preoperatively including but not limited to the risks of infection, bleeding, nerve injury, cardiopulmonary complications, the need for revision surgery, among others, and the patient was willing to proceed.  OPERATIVE IMPLANTS: none  OPERATIVE FINDINGS: nonunion of fracture  BLOOD LOSS: min  COMPLICATIONS: none  TOURNIQUET TIME: none  OPERATIVE PROCEDURE:  Patient was identified in the preoperative holding area and site was marked by me She was transported to the operating theater and placed on the table in supine position taking care to pad all bony prominences. After a preincinduction time out anesthesia was induced. The left lower extremity was prepped and draped in normal sterile fashion and a pre-incision timeout was performed. She received ancef after cultures for preoperative antibiotics.   I debrided her lateral incision and sent cultures of some of this tissue.  This was an excisional debridement of necrotic tissue.  Next I made her proximal incision I incised through this and bluntly dissected down to her plate I was able to remove all screws from her plate and remove all hardware from her lateral side this effectively removed all exposed hardware.  I then used a rondure to debride any devitalized soft tissue fascia  skin-muscle bone on the lateral side.  After this I closed surgical incision I reapproximated as much skin as possible and placed a Praveena wound VAC over top of the remainder.  POST OPERATIVE PLAN: Continue nonweightbearing continue wound care as an outpatient. Chemical dvt px

## 2018-03-31 NOTE — Progress Notes (Signed)
Anesthesia imade aware on 03/30/2018 patient recent history of fall with ankle surgery and post surgery elevated [facility] at [facility].  No new orders given

## 2018-03-31 NOTE — Progress Notes (Signed)
Placed on chart the following [facility]:  02/20/2018-EKGs thru 02/26/2018 EKG.   CXR- report from 02/19/2018  And Discharge Summary.

## 2018-03-31 NOTE — Progress Notes (Signed)
AssistedDr. Houser with left, ultrasound guided, popliteal/saphenous block. Side rails up, monitors on throughout procedure. See vital signs in flow sheet. Tolerated Procedure well.  

## 2018-03-31 NOTE — Anesthesia Postprocedure Evaluation (Signed)
Anesthesia Post Note  Patient: Ana Franco  Procedure(s) Performed: HARDWARE REMOVAL (Left Ankle) IRRIGATION AND DEBRIDEMENT OF LEFT ANKLE (Left Ankle) APPLICATION OF WOUND VAC (Left Ankle)     Patient location during evaluation: PACU Anesthesia Type: Regional Level of consciousness: awake and alert Pain management: pain level controlled Vital Signs Assessment: post-procedure vital signs reviewed and stable Respiratory status: spontaneous breathing, nonlabored ventilation, respiratory function stable and patient connected to nasal cannula oxygen Cardiovascular status: stable and blood pressure returned to baseline Postop Assessment: no apparent nausea or vomiting Anesthetic complications: no    Last Vitals:  Vitals:   03/31/18 1400 03/31/18 1415  BP: (!) 143/56 (!) 157/58  Pulse: (!) 52 60  Resp: 13 16  Temp:  (!) 36.4 C  SpO2: 100% 100%    Last Pain:  Vitals:   03/31/18 1415  TempSrc:   PainSc: 0-No pain                 Trevor IhaStephen A Houser

## 2018-03-31 NOTE — Discharge Instructions (Signed)
Diet: As you were doing prior to hospitalization   Dressing:  Keep dressings on and dry until follow up.  Activity:  Increase activity slowly as tolerated, but follow the weight bearing instructions below.  The rules on driving is that you can not be taking narcotics while you drive, and you must feel in control of the vehicle.    Weight Bearing:   As tolerated in walking boot    To prevent constipation: you may use a stool softener such as -  Colace (over the counter) 100 mg by mouth twice a day  Drink plenty of fluids (prune juice may be helpful) and high fiber foods Miralax (over the counter) for constipation as needed.    Itching:  If you experience itching with your medications, try taking only a single pain pill, or even half a pain pill at a time.  You can also use benadryl over the counter for itching or also to help with sleep.   Precautions:  If you experience chest pain or shortness of breath - call 911 immediately for transfer to the hospital emergency department!!  If you develop a fever greater that 101 F, purulent drainage from wound, increased redness or drainage from wound, or calf pain -- Call the office at [phone]                                                 Follow- Up Appointment:  Please call for an appointment to be seen in 1-2 weeks [place] - [phone]

## 2018-03-31 NOTE — Transfer of Care (Signed)
Immediate Anesthesia Transfer of Care Note  Patient: Ana Franco  Procedure(s) Performed: Procedure(s): HARDWARE REMOVAL (Left) IRRIGATION AND DEBRIDEMENT OF LEFT ANKLE (Left) APPLICATION OF WOUND VAC (Left)  Patient Location: PACU  Anesthesia Type:MAC  Level of Consciousness:  sedated, patient cooperative and responds to stimulation  Airway & Oxygen Therapy:Patient Spontanous Breathing and Patient connected to face mask oxgen  Post-op Assessment:  Report given to PACU RN and Post -op Vital signs reviewed and stable  Post vital signs:  Reviewed and stable  Last Vitals:  Vitals:   03/31/18 1103 03/31/18 1324  BP:    Pulse: 63   Resp: 14   Temp:  (P) 36.6 C  SpO2: 128%     Complications: No apparent anesthesia complications

## 2018-03-31 NOTE — H&P (Signed)
ORTHOPAEDIC CONSULTATION  REQUESTING PHYSICIAN: Renette Butters, MD  Chief Complaint: left ankle wound breakdown  HPI: Ana Franco is a 84 y.o. female who complains of  ORIF in early jjuly in [place]. Wound breakdown over her lateral incision with exposed hardware  Past Medical History:  Diagnosis Date  . Anemia    vitamin b 12 deficiency anemia  . 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 Chronic venous insufficiency   . Depression   . Difficulty in walking   . Displaced trimalleolar fracture of left lower leg   . Essential (primary) hypertension   . GERD (gastroesophageal reflux disease)   . History of blood transfusion    02/2018   . History of falling   . Hyperlipidemia   . Hyperlipidemia   . Hypertension   . 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 Iron deficiency   . Major depressive disorder   . Muscle weakness (generalized)   . Myocardial infarction ([place])    nonstemi mi - 02/19/2018 at [facility] after ankle fracture   . Nutritional deficiency, unspecified   . Obesity   . Other lack of coordination   . Unspecified atrial fibrillation ([place])   . Unspecified osteoarthritis, unspecified site    Past Surgical History:  Procedure Laterality Date  . APPENDECTOMY    . ORIF ANKLE FRACTURE Right 06/13/2014   Procedure: OPEN REDUCTION INTERNAL FIXATION (ORIF) BIMALLEOLAR ANKLE FRACTURE;  Surgeon: Ninetta Lights, MD;  Location: [facility];  Service: Orthopedics;  Laterality: Right;   Social History   Socioeconomic History  . Marital status: Widowed    Spouse name: Not on file  . Number of children: Not on file  . Years of education: Not on file  . Highest education level: Not on file  Occupational History  . Not on file  Social Needs  . Financial resource strain: Not on file  . Food insecurity:    Worry: Not on file      Inability: Not on file  . Transportation needs:    Medical: Not on file    Non-medical: Not on file  Tobacco Use  . Smoking status: Never Smoker  . Smokeless tobacco: Never Used  Substance and Sexual Activity  . Alcohol use: No  . Drug use: No  . Sexual activity: Not on file  Lifestyle  . Physical activity:    Days per week: Not on file    Minutes per session: Not on file  . Stress: Not on file  Relationships  . Social connections:    Talks on phone: Not on file    Gets together: Not on file    Attends religious service: Not on file    Active member of club or organization: Not on file    Attends meetings of clubs or organizations: Not on file    Relationship status: Not on file  Other Topics Concern  . Not on file  Social History Narrative  . Not on file   History reviewed. No pertinent family history. No Known Allergies Prior to Admission medications   Medication Sig Start Date End Date Taking? Authorizing Provider  acetaminophen (TYLENOL) 325 MG tablet Take 325 mg by mouth every 6 (six) hours as needed for mild pain or moderate pain.   Yes [provider]  acetaminophen (TYLENOL) 500 MG tablet Take 500 mg by  mouth at bedtime.   Yes [provider]  amLODipine (NORVASC) 5 MG tablet Take 5 mg by mouth daily.   Yes [provider]  aspirin EC 325 MG tablet Take 1 tablet (325 mg total) by mouth daily. 06/13/14  Yes Aundra Dubin, PA-C  Calcium Citrate-Vitamin D (CALCIUM + D PO) Take 1 tablet by mouth daily.   Yes [provider]  ciprofloxacin (CIPRO) 500 MG tablet Take 500 mg by mouth 2 (two) times daily. For 14 days - Start date 03/23/2018   Yes [provider]  citalopram (CELEXA) 20 MG tablet Take 20 mg by mouth daily.   Yes [provider]  esomeprazole (NEXIUM) 40 MG capsule Take 40 mg by mouth daily at 12 noon.   Yes [provider]  ezetimibe-simvastatin (VYTORIN) 10-40 MG per tablet Take 1 tablet by  mouth at bedtime.   Yes [provider]  ferrous sulfate 325 (65 FE) MG tablet Take 325 mg by mouth 2 (two) times daily with a meal.   Yes [provider]  losartan (COZAAR) 100 MG tablet Take 100 mg by mouth daily.   Yes [provider]  metoprolol tartrate (LOPRESSOR) 100 MG tablet Take 100 mg by mouth every morning.    Yes [provider]  metoprolol tartrate (LOPRESSOR) 25 MG tablet Take 25 mg by mouth every evening.    Yes [provider]  Multiple Vitamins-Minerals (DECUBI-VITE PO) Take 1 tablet by mouth daily.   Yes [provider]  multivitamin-lutein (OCUVITE-LUTEIN) CAPS capsule Take 1 capsule by mouth daily.   Yes [provider]  nitroGLYCERIN (NITROSTAT) 0.4 MG SL tablet Place 0.4 mg under the tongue every 5 (five) minutes as needed for chest pain.   Yes [provider]  vitamin B-12 (CYANOCOBALAMIN) 1000 MCG tablet Take 1,000 mcg by mouth daily.   Yes [provider]   No results found.  Positive ROS: All other systems have been reviewed and were otherwise negative with the exception of those mentioned in the HPI and as above.  Labs cbc No results for input(s): WBC, HGB, HCT, PLT in the last 72 hours.  Labs inflam No results for input(s): CRP in the last 72 hours.  Invalid input(s): ESR  Labs coag No results for input(s): INR, PTT in the last 72 hours.  Invalid input(s): PT  No results for input(s): NA, K, CL, CO2, GLUCOSE, BUN, CREATININE, CALCIUM in the last 72 hours.  Physical Exam: Vitals:   03/31/18 0945  BP: (!) 156/48  Pulse: 61  Resp: 18  Temp: 97.8 F (36.6 C)  SpO2: 100%   General: Alert, no acute distress Cardiovascular: No pedal edema Respiratory: No cyanosis, no use of accessory musculature GI: No organomegaly, abdomen is soft and non-tender Skin: No lesions in the area of chief complaint other than those listed below in MSK exam.  Neurologic: Sensation intact  distally save for the below mentioned MSK exam Psychiatric: Patient is competent for consent with normal mood and affect Lymphatic: No axillary or cervical lymphadenopathy  MUSCULOSKELETAL:  LLE: wound breakdown with exposed hardware on lateral side Other extremities are atraumatic with painless ROM and NVI.  Assessment: Wound dehiscence L lateral wound   Plan: HDR Suction dressing application.    Renette Butters, MD Cell [phone]   03/31/2018 10:12 AM

## 2018-04-01 ENCOUNTER — Encounter (HOSPITAL_COMMUNITY): Payer: Self-pay | Admitting: Orthopedic Surgery

## 2018-04-01 DIAGNOSIS — T8469XA Infection and inflammatory reaction due to internal fixation device of other site, initial encounter: Secondary | ICD-10-CM | POA: Diagnosis not present

## 2018-04-01 NOTE — Care Management Obs Status (Signed)
MEDICARE OBSERVATION STATUS NOTIFICATION   Patient Details  Name: Ana Franco MRN: [id number] Date of Birth: Nov 28, 1925   Medicare Observation Status Notification Given:  Yes    Alexis Goodelleele, Valyn Latchford K, RN 04/01/2018, 12:13 PM

## 2018-04-01 NOTE — Clinical Social Work Placement (Signed)
   CLINICAL SOCIAL WORK PLACEMENT  NOTE  Date:  04/01/2018  Patient Details  Name: Ana SanesDorothy Monforte MRN: [id number] Date of Birth: November 11, 1925  Clinical Social Work is seeking post-discharge placement for this patient at the Skilled  Nursing Facility level of care (*CSW will initial, date and re-position this form in  chart as items are completed):      Patient/family provided with [facility] Clinical Social Work Department's list of facilities offering this level of care within the geographic area requested by the patient (or if unable, by the patient's family).      Patient/family informed of their freedom to choose among providers that offer the needed level of care, that participate in Medicare, Medicaid or managed care program needed by the patient, have an available bed and are willing to accept the patient.      Patient/family informed of [facility] ownership interest in [facility] and [facility], as well as of the fact that they are under no obligation to receive care at these facilities.  PASRR submitted to EDS on       PASRR number received on       Existing PASRR number confirmed on 04/01/18     FL2 transmitted to all facilities in geographic area requested by pt/family on       FL2 transmitted to all facilities within larger geographic area on       Patient informed that his/her managed care company has contracts with or will negotiate with certain facilities, including the following:  [facility]         Patient/family informed of bed offers received.  [facility]     Physician recommends and patient chooses bed at      [facility].  Patient to be transferred to facility by [facility]     Patient family notified on 04/01/18 of transfer.  Name of family member notified:  Son     PHYSICIAN       Additional Comment:     _______________________________________________ Clearance CootsNicole A Emmalyn Hinson, LCSW 04/01/2018, 1:37 PM

## 2018-04-01 NOTE — Clinical Social Work Note (Signed)
Clinical Social Work Assessment  Patient Details  Name: Ana Franco MRN: [id number] Date of Birth: October 16, 1925  Date of referral:  04/01/18               Reason for consult:  Discharge Planning, Facility Placement                Permission sought to share information with:  Oceanographeracility Contact Representative Permission granted to share information::  Yes, Verbal Permission Granted  Name::        Agency::  [facility]   Relationship::   Son   Contact Information:     Housing/Transportation Living arrangements for the past 2 months:  Skilled Building surveyorursing Facility Source of Information:  Patient Patient Interpreter Needed:  None Criminal Activity/Legal Involvement Pertinent to Current Situation/Hospitalization:  No - Comment as needed Significant Relationships:  Adult Children Lives with:  Self Do you feel safe going back to the place where you live?  Yes Need for family participation in patient care:  Yes (Comment)  Care giving concerns:   SNF placement for rehab.  Closed Left Ankle Fracture.   Social Worker assessment / plan:  Patient prearranged to go the SNF facility. CSW confirmed bed at [facility]. Patient son plans to pay privately for patient stay. The shuttle bus will pick the patient up and transport to 3:00pm appointment and then transport to facility.  Patient will d/c with [facility].   Plan: SNF  Employment status:  Retired Database administratornsurance information:  Managed Medicare PT Recommendations:  Skilled Nursing Facility Information / Referral to community resources:  Skilled Nursing Facility  Patient/Family's Response to care:  Agreeable and Responding well to care.   Patient/Family's Understanding of and Emotional Response to Diagnosis, Current Treatment, and Prognosis:  Patient deferred to her son for support.   Emotional Assessment Appearance:  Appears stated age Attitude/Demeanor/Rapport:    Affect (typically observed):  Accepting Orientation:  Oriented to Self,  Oriented to Place, Oriented to  Time, Oriented to Situation Alcohol / Substance use:  Not Applicable Psych involvement (Current and /or in the community):  No (Comment)  Discharge Needs  Concerns to be addressed:  Discharge Planning Concerns Readmission within the last 30 days:  No Current discharge risk:  Dependent with Mobility Barriers to Discharge:  No Barriers Identified   Clearance CootsNicole A Derika Eckles, LCSW 04/01/2018, 1:41 PM

## 2018-04-01 NOTE — Progress Notes (Signed)
The facility [facility] is requesting physician update D/C summary to indicate "the facility will not have to care for the [facility] while at SNF, the patient is weightbearing as tolerated." Facility will not accept the patient until these changes have been made to the discharge summary.

## 2018-04-01 NOTE — NC FL2 (Addendum)
[place] MEDICAID FL2 LEVEL OF CARE SCREENING TOOL     IDENTIFICATION  Patient Name: Ana SanesDorothy Burrous Birthdate: 10/14/1925 Sex: female Admission Date (Current Location): 03/31/2018  [facility] [place]   Facility and Address:  [facility],  [address], [place] [id number]      Provider Number: [id number]  Attending Physician Name and Address:  Sheral ApleyMurphy, Timothy D, MD  Relative Name and Phone Number:       Current Level of Care: Hospital Recommended Level of Care: Skilled Nursing Facility Prior Approval Number:    Date Approved/Denied:   PASRR Number:  [id number]   Discharge Plan: SNF    Current Diagnoses: Patient Active Problem List   Diagnosis Date Noted  . Closed left ankle fracture, with delayed healing, subsequent encounter 03/31/2018  . Essential hypertension, benign 06/23/2014  . Dyslipidemia 06/23/2014  . GERD (gastroesophageal reflux disease) 06/23/2014  . UTI (urinary tract infection) 06/23/2014  . Depression 06/23/2014  . Ankle fracture 06/12/2014    Orientation RESPIRATION BLADDER Height & Weight     Self, Time, Situation, Place  Normal Continent Weight: 208 lb 8 oz (94.6 kg) Height:  5\' 6"  (167.6 cm)  BEHAVIORAL SYMPTOMS/MOOD NEUROLOGICAL BOWEL NUTRITION STATUS      Continent Diet(Heart Healthy )  AMBULATORY STATUS COMMUNICATION OF NEEDS Skin   Extensive Assist Verbally Surgical wounds(Left Ankle)                       Personal Care Assistance Level of Assistance  Bathing, Feeding, Dressing Bathing Assistance: Limited assistance Feeding assistance: Independent Dressing Assistance: Limited assistance     Functional Limitations Info  Sight, Hearing, Speech Sight Info: Impaired Hearing Info: Adequate Speech Info: Adequate    SPECIAL CARE FACTORS FREQUENCY        PT Frequency: 5x/week(Weight bearing as tolerated.)              Contractures Contractures Info: Not present    Additional Factors  Info  Code Status, Allergies, Psychotropic Code Status Info: fullcode Allergies Info: No Known Allergies Psychotropic Info: celexa         Current Medications (04/01/2018):  This is the current hospital active medication list Current Facility-Administered Medications  Medication Dose Route Frequency Provider Last Rate Last Dose  . acetaminophen (TYLENOL) tablet 325-650 mg  325-650 mg Oral Q6H PRN [facility] BilletMartensen, Henry Calvin III, PA-C      . acetaminophen (TYLENOL) tablet 500 mg  500 mg Oral QHS Martensen, Lucretia KernHenry Calvin III, PA-C      . acetaminophen (TYLENOL) tablet 500 mg  500 mg Oral Q6H Albina BilletMartensen, Henry Calvin III, PA-C   500 mg at 04/01/18 [id number]  . amLODipine (NORVASC) tablet 5 mg  5 mg Oral Daily Albina BilletMartensen, Henry Calvin III, PA-C   5 mg at 04/01/18 1026  . aspirin EC tablet 325 mg  325 mg Oral Daily Albina BilletMartensen, Henry Calvin III, PA-C   325 mg at 04/01/18 1026  . calcium-vitamin D (OSCAL WITH D) 500-200 MG-UNIT per tablet 1 tablet  1 tablet Oral Q breakfast Sheral ApleyMurphy, Timothy D, MD   1 tablet at 04/01/18 0827  . ciprofloxacin (CIPRO) tablet 500 mg  500 mg Oral BID Albina BilletMartensen, Henry Calvin III, PA-C   500 mg at 04/01/18 0827  . citalopram (CELEXA) tablet 20 mg  20 mg Oral Daily Albina BilletMartensen, Henry Calvin III, PA-C   20 mg at 04/01/18 1028  . docusate sodium (COLACE) capsule 100 mg  100 mg Oral BID  Albina BilletMartensen, Henry Calvin III, PA-C   100 mg at 04/01/18 1028  . enoxaparin (LOVENOX) injection 40 mg  40 mg Subcutaneous Q24H Albina BilletMartensen, Henry Calvin III, PA-C   40 mg at 04/01/18 [id number]  . ezetimibe-simvastatin (VYTORIN) 10-40 MG per tablet 1 tablet  1 tablet Oral QHS Albina BilletMartensen, Henry Calvin III, PA-C   1 tablet at 03/31/18 2300  . ferrous sulfate tablet 325 mg  325 mg Oral BID WC Albina BilletMartensen, Henry Calvin III, PA-C   325 mg at 04/01/18 0827  . HYDROcodone-acetaminophen (NORCO/VICODIN) 5-325 MG per tablet 1-2 tablet  1-2 tablet Oral Q4H PRN Albina BilletMartensen, Henry Calvin III, PA-C   1 tablet at 04/01/18 0438  . lactated  ringers infusion   Intravenous Continuous Albina BilletMartensen, Henry Calvin III, PA-C 20 mL/hr at 04/01/18 [id number]    . methocarbamol (ROBAXIN) tablet 500 mg  500 mg Oral Q6H PRN Albina BilletMartensen, Henry Calvin III, PA-C   500 mg at 03/31/18 2025   Or  . methocarbamol (ROBAXIN) 500 mg in dextrose 5 % 50 mL IVPB  500 mg Intravenous Q6H PRN Albina BilletMartensen, Henry Calvin III, PA-C   Stopped at 03/31/18 1407  . metoCLOPramide (REGLAN) tablet 5-10 mg  5-10 mg Oral Q8H PRN Albina BilletMartensen, Henry Calvin III, PA-C       Or  . metoCLOPramide (REGLAN) injection 5-10 mg  5-10 mg Intravenous Q8H PRN Albina BilletMartensen, Henry Calvin III, PA-C      . metoprolol tartrate (LOPRESSOR) tablet 100 mg  100 mg Oral [address]a Martensen, Henry Calvin III, PA-C   100 mg at 04/01/18 0700  . metoprolol tartrate (LOPRESSOR) tablet 25 mg  25 mg Oral QPM Albina BilletMartensen, Henry Calvin III, PA-C   25 mg at 03/31/18 1728  . morphine 2 MG/ML injection 0.5-1 mg  0.5-1 mg Intravenous Q2H PRN Albina BilletMartensen, Henry Calvin III, PA-C   0.5 mg at 03/31/18 2025  . multivitamin (PROSIGHT) tablet 1 tablet  1 tablet Oral Daily Albina BilletMartensen, Henry Calvin III, PA-C   1 tablet at 04/01/18 1026  . multivitamin with minerals tablet 1 tablet  1 tablet Oral Daily Sheral ApleyMurphy, Timothy D, MD   1 tablet at 04/01/18 1027  . nitroGLYCERIN (NITROSTAT) SL tablet 0.4 mg  0.4 mg Sublingual Q5 min PRN Albina BilletMartensen, Henry Calvin III, PA-C      . ondansetron [facility](ZOFRAN) tablet 4 mg  4 mg Oral Q6H PRN Albina BilletMartensen, Henry Calvin III, PA-C       Or  . ondansetron (ZOFRAN) injection 4 mg  4 mg Intravenous Q6H PRN Albina BilletMartensen, Henry Calvin III, PA-C      . pantoprazole (PROTONIX) EC tablet 40 mg  40 mg Oral Daily Albina BilletMartensen, Henry Calvin III, PA-C   40 mg at 04/01/18 1028  . polyethylene glycol (MIRALAX / GLYCOLAX) packet 17 g  17 g Oral Daily PRN Albina BilletMartensen, Henry Calvin III, PA-C      . vitamin B-12 (CYANOCOBALAMIN) tablet 1,000 mcg  1,000 mcg Oral Daily Albina BilletMartensen, Henry Calvin III, PA-C   1,000 mcg at 04/01/18 1028     Discharge  Medications: Please see discharge summary for a list of discharge medications.  Relevant Imaging Results:  Relevant Lab Results:   Additional Information ssn:[id number]  Clearance CootsNicole A Patricia Perales, LCSW

## 2018-04-01 NOTE — Discharge Summary (Addendum)
Physician Discharge Summary  Patient ID: Ana Franco MRN: [id number] DOB/AGE: [birth year] 84 y.o.  Admit date: 03/31/2018 Discharge date: 04/01/2018  Admission Diagnoses:  <principal problem not specified>  Discharge Diagnoses:  Active Problems:   Closed left ankle fracture, with delayed healing, subsequent encounter   Past Medical History:  Diagnosis Date  . Anemia    vitamin b 12 deficiency anemia  . 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 Chronic venous insufficiency   . Depression   . Difficulty in walking   . Displaced trimalleolar fracture of left lower leg   . Essential (primary) hypertension   . GERD (gastroesophageal reflux disease)   . History of blood transfusion    02/2018   . History of falling   . Hyperlipidemia   . Hyperlipidemia   . Hypertension   . 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 Iron deficiency   . Major depressive disorder   . Muscle weakness (generalized)   . Myocardial infarction (HCC)    nonstemi mi - [facility] fracture   . Nutritional deficiency, unspecified   . Obesity   . Other lack of coordination   . Unspecified atrial fibrillation (HCC)   . Unspecified osteoarthritis, unspecified site     Surgeries: Procedure(s): HARDWARE REMOVAL IRRIGATION AND DEBRIDEMENT OF LEFT ANKLE APPLICATION OF WOUND VAC on 03/31/2018   Consultants (if any):   Discharged Condition: Improved  Hospital Course: Ana SanesDorothy Franco is an 84 y.o. female who was admitted 03/31/2018 with a diagnosis of <principal problem not specified> and went to the operating room on 03/31/2018 and underwent the above named procedures.    She was given perioperative antibiotics:  Anti-infectives (From admission, onward)   Start     Dose/Rate Route Frequency Ordered Stop   03/31/18 2000  ciprofloxacin (CIPRO)  tablet 500 mg     500 mg Oral 2 times daily 03/31/18 1421     03/31/18 1830  ceFAZolin (ANCEF) IVPB 2g/100 mL premix     2 g 200 mL/hr over 30 Minutes Intravenous Every 6 hours 03/31/18 1421 04/01/18 1229   03/31/18 0945  ceFAZolin (ANCEF) IVPB 2g/100 mL premix     2 g 200 mL/hr over 30 Minutes Intravenous On call to O.R. 03/31/18 0932 03/31/18 1236   03/31/18 0000  doxycycline (VIBRAMYCIN) 50 MG capsule     100 mg Oral 2 times daily 03/31/18 1334 04/14/18 2359    .  She was given sequential compression devices, early ambulation, and chemical px for DVT prophylaxis.  She benefited maximally from the hospital stay and there were no complications.    Recent vital signs:  Vitals:   04/01/18 0305 04/01/18 0658  BP: (!) 127/51 (!) 137/53  Pulse: 75 73  Resp: 20 16  Temp: 98.2 F (36.8 C) 98.6 F (37 C)  SpO2: 97% 98%    Recent laboratory studies:  Lab Results  Component Value Date   HGB 9.1 (L) 03/31/2018   HGB 10.3 (A) 07/20/2014   HGB 10.1 (A) 06/23/2014   Lab Results  Component Value Date   WBC 16.1 (H) 03/31/2018   PLT 439 (H) 03/31/2018   Lab Results  Component Value Date   INR 1.11 06/12/2014   Lab Results  Component Value Date   NA 142 03/31/2018   K 3.9 03/31/2018   CL 107 03/31/2018   CO2 24  03/31/2018   BUN 30 (H) 03/31/2018   CREATININE 1.05 (H) 03/31/2018   GLUCOSE 110 (H) 03/31/2018    Discharge Medications:   Allergies as of 04/01/2018   No Known Allergies     Medication List    TAKE these medications   acetaminophen 325 MG tablet Commonly known as:  TYLENOL Take 325 mg by mouth every 6 (six) hours as needed for mild pain or moderate pain.   acetaminophen 500 MG tablet Commonly known as:  TYLENOL Take 500 mg by mouth at bedtime.   amLODipine 5 MG tablet Commonly known as:  NORVASC Take 5 mg by mouth daily.   aspirin EC 325 MG tablet Take 1 tablet (325 mg total) by mouth daily.   CALCIUM + D PO Take 1 tablet by mouth daily.    ciprofloxacin 500 MG tablet Commonly known as:  CIPRO Take 500 mg by mouth 2 (two) times daily. For 14 days - Start date 03/23/2018   citalopram 20 MG tablet Commonly known as:  CELEXA Take 20 mg by mouth daily.   doxycycline 50 MG capsule Commonly known as:  VIBRAMYCIN Take 2 capsules (100 mg total) by mouth 2 (two) times daily for 14 days.   esomeprazole 40 MG capsule Commonly known as:  NEXIUM Take 40 mg by mouth daily at 12 noon.   ezetimibe-simvastatin 10-40 MG tablet Commonly known as:  VYTORIN Take 1 tablet by mouth at bedtime.   ferrous sulfate 325 (65 FE) MG tablet Take 325 mg by mouth 2 (two) times daily with a meal.   HYDROcodone-acetaminophen 5-325 MG tablet Commonly known as:  NORCO/VICODIN Take 1 tablet by mouth every 6 (six) hours as needed for moderate pain.   losartan 100 MG tablet Commonly known as:  COZAAR Take 100 mg by mouth daily.   metoprolol tartrate 100 MG tablet Commonly known as:  LOPRESSOR Take 100 mg by mouth every morning.   metoprolol tartrate 25 MG tablet Commonly known as:  LOPRESSOR Take 25 mg by mouth every evening.   multivitamin-lutein Caps capsule Take 1 capsule by mouth daily.   DECUBI-VITE PO Take 1 tablet by mouth daily.   nitroGLYCERIN 0.4 MG SL tablet Commonly known as:  NITROSTAT Place 0.4 mg under the tongue every 5 (five) minutes as needed for chest pain.   ondansetron 4 MG tablet Commonly known as:  ZOFRAN Take 1 tablet (4 mg total) by mouth every 8 (eight) hours as needed for nausea or vomiting.   vitamin B-12 1000 MCG tablet Commonly known as:  CYANOCOBALAMIN Take 1,000 mcg by mouth daily.       Diagnostic Studies: Ct Ankle Left Wo Contrast  Result Date: 03/23/2018 CLINICAL DATA:  The patient suffered a trimalleolar left ankle fracture in a fall 02/19/2018 and underwent subsequent open reduction internal fixation. Lateral ankle infection. EXAM: CT OF THE LEFT ANKLE WITHOUT CONTRAST TECHNIQUE: Multidetector  CT imaging of the left ankle was performed according to the standard protocol. Multiplanar CT image reconstructions were also generated. COMPARISON:  None. FINDINGS: Bones/Joint/Cartilage The patient is status post fixation of medial, the patient is status post fixation of a medial malleolar fracture with a single screw in place. Posterior plate and screws for fixation of a posterior malleolar fracture also identified. Finally, the patient is status post lateral plate and screw fixation of a distal fibular fracture. All hardware is intact without evidence of loosening. A gap at the posterior aspect of the tibial plafond and measures 0.5 cm AP by approximately  1.6 cm transverse. There is some bridging bone about all of the patient's fractures. A defect in the anterior cortex of the distal fibula measures 2 cm craniocaudal. Several small cortical fragments are seen along the anterior aspect of the distal fibula at the level of this cortical defect. There is some periosteal reaction along the distal metaphysis of the tibia. No acute fracture is identified. Ligaments Suboptimally assessed by CT. Muscles and Tendons Visualization is limited due to streak artifact from hardware but no tear is identified. No intramuscular fluid collection is seen. No gas within muscle or tracking along fascial planes is present. Soft tissues There is subcutaneous edema about the ankle. Bandaging is present over the lateral malleolus. No underlying focal fluid collection, gas or unexpected radiopaque foreign body is identified. IMPRESSION: Soft tissue wound over the lateral malleolus. No underlying foreign body or soft tissue gas identified. There is a defect in the anterior cortex of the distal fibula deep to the wound which could be posttraumatic or surgical but could also be secondary to osteomyelitis. Status post fixation of trimalleolar fractures. There is evidence of healing of all the patient's fractures. Hardware is intact without  evidence of loosening. Electronically Signed   By: Drusilla Kanner M.D.   On: 03/23/2018 14:05    Disposition: Discharge disposition: 03-Skilled Nursing Facility       Discharge Instructions    Discharge patient   Complete by:  As directed    Already has a bed   Discharge disposition:  03-Skilled Nursing Facility   Discharge patient date:  04/01/2018       Contact information for follow-up providers    Sheral Apley, MD Follow up.   Specialty:  Orthopedic Surgery Contact information: [address] [place] [address] [phone]            Contact information for after-discharge care    Destination    [facility] .   Service:  Skilled Nursing Contact information: [address] [place] [address] [phone]                 F/u for Marnee Guarneri will be at wound care clinic today and with me 8/30   No dressing changes of Wound vac at facility. This will be changed at wound care appointments.   Signed: Sheral Apley 04/01/2018, 7:01 AM

## 2018-04-02 ENCOUNTER — Inpatient Hospital Stay (HOSPITAL_COMMUNITY)
Admission: AD | Admit: 2018-04-02 | Discharge: 2018-04-14 | DRG: 857 | Disposition: A | Discharge status: Skilled Nursing Facility | Payer: Medicare Other | Source: Skilled Nursing Facility | Attending: Internal Medicine | Admitting: Internal Medicine

## 2018-04-02 ENCOUNTER — Encounter (HOSPITAL_COMMUNITY): Payer: Self-pay | Admitting: Internal Medicine

## 2018-04-02 DIAGNOSIS — E46 Unspecified protein-calorie malnutrition: Secondary | ICD-10-CM | POA: Diagnosis present

## 2018-04-02 DIAGNOSIS — M810 Age-related osteoporosis without current pathological fracture: Secondary | ICD-10-CM | POA: Diagnosis present

## 2018-04-02 DIAGNOSIS — S82892G Other fracture of left lower leg, subsequent encounter for closed fracture with delayed healing: Secondary | ICD-10-CM

## 2018-04-02 DIAGNOSIS — T8142XA Infection following a procedure, deep incisional surgical site, initial encounter: Secondary | ICD-10-CM | POA: Diagnosis present

## 2018-04-02 DIAGNOSIS — N183 Chronic kidney disease, stage 3 (moderate): Secondary | ICD-10-CM | POA: Diagnosis present

## 2018-04-02 DIAGNOSIS — I10 Essential (primary) hypertension: Secondary | ICD-10-CM | POA: Diagnosis not present

## 2018-04-02 DIAGNOSIS — Z9889 Other specified postprocedural states: Secondary | ICD-10-CM

## 2018-04-02 DIAGNOSIS — L089 Local infection of the skin and subcutaneous tissue, unspecified: Secondary | ICD-10-CM | POA: Diagnosis not present

## 2018-04-02 DIAGNOSIS — I129 Hypertensive chronic kidney disease with stage 1 through stage 4 chronic kidney disease, or unspecified chronic kidney disease: Secondary | ICD-10-CM | POA: Diagnosis present

## 2018-04-02 DIAGNOSIS — K219 Gastro-esophageal reflux disease without esophagitis: Secondary | ICD-10-CM | POA: Diagnosis present

## 2018-04-02 DIAGNOSIS — Z66 Do not resuscitate: Secondary | ICD-10-CM | POA: Diagnosis present

## 2018-04-02 DIAGNOSIS — S82842K Displaced bimalleolar fracture of left lower leg, subsequent encounter for closed fracture with nonunion: Secondary | ICD-10-CM | POA: Diagnosis not present

## 2018-04-02 DIAGNOSIS — E876 Hypokalemia: Secondary | ICD-10-CM | POA: Diagnosis present

## 2018-04-02 DIAGNOSIS — M00072 Staphylococcal arthritis, left ankle and foot: Secondary | ICD-10-CM | POA: Diagnosis not present

## 2018-04-02 DIAGNOSIS — Y838 Other surgical procedures as the cause of abnormal reaction of the patient, or of later complication, without mention of misadventure at the time of the procedure: Secondary | ICD-10-CM | POA: Diagnosis present

## 2018-04-02 DIAGNOSIS — E785 Hyperlipidemia, unspecified: Secondary | ICD-10-CM | POA: Diagnosis present

## 2018-04-02 DIAGNOSIS — T8133XA Disruption of traumatic injury wound repair, initial encounter: Secondary | ICD-10-CM | POA: Diagnosis present

## 2018-04-02 DIAGNOSIS — Z6833 Body mass index (BMI) 33.0-33.9, adult: Secondary | ICD-10-CM | POA: Diagnosis not present

## 2018-04-02 DIAGNOSIS — T8149XD Infection following a procedure, other surgical site, subsequent encounter: Secondary | ICD-10-CM | POA: Diagnosis not present

## 2018-04-02 DIAGNOSIS — B9562 Methicillin resistant Staphylococcus aureus infection as the cause of diseases classified elsewhere: Secondary | ICD-10-CM | POA: Diagnosis present

## 2018-04-02 DIAGNOSIS — T148XXA Other injury of unspecified body region, initial encounter: Secondary | ICD-10-CM | POA: Diagnosis not present

## 2018-04-02 DIAGNOSIS — W109XXD Fall (on) (from) unspecified stairs and steps, subsequent encounter: Secondary | ICD-10-CM | POA: Diagnosis not present

## 2018-04-02 DIAGNOSIS — I482 Chronic atrial fibrillation: Secondary | ICD-10-CM | POA: Diagnosis not present

## 2018-04-02 DIAGNOSIS — I252 Old myocardial infarction: Secondary | ICD-10-CM

## 2018-04-02 DIAGNOSIS — T8133XD Disruption of traumatic injury wound repair, subsequent encounter: Secondary | ICD-10-CM

## 2018-04-02 DIAGNOSIS — Z967 Presence of other bone and tendon implants: Secondary | ICD-10-CM | POA: Diagnosis not present

## 2018-04-02 DIAGNOSIS — K5900 Constipation, unspecified: Secondary | ICD-10-CM | POA: Diagnosis present

## 2018-04-02 DIAGNOSIS — I480 Paroxysmal atrial fibrillation: Secondary | ICD-10-CM | POA: Diagnosis present

## 2018-04-02 DIAGNOSIS — W108XXD Fall (on) (from) other stairs and steps, subsequent encounter: Secondary | ICD-10-CM | POA: Diagnosis present

## 2018-04-02 DIAGNOSIS — X58XXXD Exposure to other specified factors, subsequent encounter: Secondary | ICD-10-CM | POA: Diagnosis not present

## 2018-04-02 DIAGNOSIS — Z89512 Acquired absence of left leg below knee: Secondary | ICD-10-CM

## 2018-04-02 DIAGNOSIS — S82892K Other fracture of left lower leg, subsequent encounter for closed fracture with nonunion: Secondary | ICD-10-CM | POA: Diagnosis not present

## 2018-04-02 DIAGNOSIS — Z22322 Carrier or suspected carrier of Methicillin resistant Staphylococcus aureus: Secondary | ICD-10-CM | POA: Diagnosis not present

## 2018-04-02 DIAGNOSIS — T8140XA Infection following a procedure, unspecified, initial encounter: Secondary | ICD-10-CM | POA: Diagnosis not present

## 2018-04-02 DIAGNOSIS — Z95828 Presence of other vascular implants and grafts: Secondary | ICD-10-CM | POA: Diagnosis not present

## 2018-04-02 DIAGNOSIS — D62 Acute posthemorrhagic anemia: Secondary | ICD-10-CM | POA: Diagnosis present

## 2018-04-02 DIAGNOSIS — Z8781 Personal history of (healed) traumatic fracture: Secondary | ICD-10-CM

## 2018-04-02 DIAGNOSIS — Z7982 Long term (current) use of aspirin: Secondary | ICD-10-CM

## 2018-04-02 DIAGNOSIS — M86272 Subacute osteomyelitis, left ankle and foot: Secondary | ICD-10-CM | POA: Diagnosis present

## 2018-04-02 DIAGNOSIS — I96 Gangrene, not elsewhere classified: Secondary | ICD-10-CM | POA: Diagnosis not present

## 2018-04-02 DIAGNOSIS — I739 Peripheral vascular disease, unspecified: Secondary | ICD-10-CM | POA: Diagnosis present

## 2018-04-02 DIAGNOSIS — D509 Iron deficiency anemia, unspecified: Secondary | ICD-10-CM | POA: Diagnosis present

## 2018-04-02 LAB — LACTIC ACID, PLASMA
Lactic Acid, Venous: 1.4 mmol/L (ref 0.5–1.9)
Lactic Acid, Venous: 1.2 mmol/L (ref 0.5–1.9)

## 2018-04-02 LAB — CBC WITH DIFFERENTIAL/PLATELET
Abs Immature Granulocytes: 0 10*3/uL (ref 0.0–0.1)
Basophils Absolute: 0 10*3/uL (ref 0.0–0.1)
Basophils Relative: 0 %
Eosinophils Absolute: 0.2 10*3/uL (ref 0.0–0.7)
Eosinophils Relative: 2 %
HCT: 23.5 % — ABNORMAL LOW (ref 36.0–46.0)
Hemoglobin: 7.3 g/dL — ABNORMAL LOW (ref 12.0–15.0)
Immature Granulocytes: 0 %
Lymphocytes Relative: 40 %
Lymphs Abs: 4 10*3/uL (ref 0.7–4.0)
MCH: 29.6 pg (ref 26.0–34.0)
MCHC: 31.1 g/dL (ref 30.0–36.0)
MCV: 95.1 fL (ref 78.0–100.0)
Monocytes Absolute: 0.8 10*3/uL (ref 0.1–1.0)
Monocytes Relative: 8 %
Neutro Abs: 5 10*3/uL (ref 1.7–7.7)
Neutrophils Relative %: 50 %
Platelets: 298 10*3/uL (ref 150–400)
RBC: 2.47 MIL/uL — ABNORMAL LOW (ref 3.87–5.11)
RDW: 14 % (ref 11.5–15.5)
WBC: 10 10*3/uL (ref 4.0–10.5)

## 2018-04-02 LAB — BASIC METABOLIC PANEL
Anion gap: 9 (ref 5–15)
BUN: 16 mg/dL (ref 8–23)
CO2: 24 mmol/L (ref 22–32)
Calcium: 8.8 mg/dL — ABNORMAL LOW (ref 8.9–10.3)
Chloride: 105 mmol/L (ref 98–111)
Creatinine, Ser: 1.01 mg/dL — ABNORMAL HIGH (ref 0.44–1.00)
GFR calc Af Amer: 55 mL/min — ABNORMAL LOW (ref >60)
GFR calc non Af Amer: 48 mL/min — ABNORMAL LOW (ref >60)
Glucose, Bld: 156 mg/dL — ABNORMAL HIGH (ref 70–99)
Potassium: 3.9 mmol/L (ref 3.5–5.1)
Sodium: 138 mmol/L (ref 135–145)

## 2018-04-02 LAB — PREPARE RBC (CROSSMATCH)

## 2018-04-02 LAB — TROPONIN I: Troponin I: 0.04 ng/mL (ref <0.03)

## 2018-04-02 MED ORDER — FERROUS SULFATE 325 (65 FE) MG PO TABS
325.0000 mg | ORAL_TABLET | Freq: Two times a day (BID) | ORAL | Status: DC
  Administered 2018-04-03 – 2018-04-14 (×23): 325 mg via ORAL
  Filled 2018-04-02 (×24): qty 1

## 2018-04-02 MED ORDER — CITALOPRAM HYDROBROMIDE 20 MG PO TABS
20.0000 mg | ORAL_TABLET | Freq: Every day | ORAL | Status: DC
  Administered 2018-04-03 – 2018-04-14 (×12): 20 mg via ORAL
  Filled 2018-04-02 (×12): qty 1

## 2018-04-02 MED ORDER — AMLODIPINE BESYLATE 5 MG PO TABS
5.0000 mg | ORAL_TABLET | Freq: Every day | ORAL | Status: DC
  Administered 2018-04-03 – 2018-04-04 (×2): 5 mg via ORAL
  Filled 2018-04-02 (×2): qty 1

## 2018-04-02 MED ORDER — LOSARTAN POTASSIUM 50 MG PO TABS
100.0000 mg | ORAL_TABLET | Freq: Every day | ORAL | Status: DC
  Administered 2018-04-03 – 2018-04-07 (×5): 100 mg via ORAL
  Filled 2018-04-02 (×6): qty 2

## 2018-04-02 MED ORDER — VANCOMYCIN HCL 10 G IV SOLR
1250.0000 mg | Freq: Once | INTRAVENOUS | Status: AC
  Administered 2018-04-02: 1250 mg via INTRAVENOUS
  Filled 2018-04-02: qty 1250

## 2018-04-02 MED ORDER — ONDANSETRON HCL 4 MG PO TABS: 4.0000 mg | ORAL_TABLET | Freq: Four times a day (QID) | ORAL | Status: DC | PRN

## 2018-04-02 MED ORDER — METOPROLOL TARTRATE 100 MG PO TABS
100.0000 mg | ORAL_TABLET | Freq: Every day | ORAL | Status: DC
  Administered 2018-04-03 – 2018-04-14 (×12): 100 mg via ORAL
  Filled 2018-04-02 (×12): qty 1

## 2018-04-02 MED ORDER — ONDANSETRON HCL 4 MG/2ML IJ SOLN: 4.0000 mg | Freq: Four times a day (QID) | INTRAMUSCULAR | Status: DC | PRN

## 2018-04-02 MED ORDER — SODIUM CHLORIDE 0.9% IV SOLUTION
Freq: Once | INTRAVENOUS | Status: AC
  Administered 2018-04-03: 01:00:00 via INTRAVENOUS

## 2018-04-02 MED ORDER — ACETAMINOPHEN 650 MG RE SUPP: 650.0000 mg | Freq: Four times a day (QID) | RECTAL | Status: DC | PRN

## 2018-04-02 MED ORDER — HYDROCODONE-ACETAMINOPHEN 5-325 MG PO TABS
1.0000 | ORAL_TABLET | Freq: Four times a day (QID) | ORAL | Status: DC | PRN
  Administered 2018-04-03 – 2018-04-10 (×12): 1 via ORAL
  Filled 2018-04-02 (×13): qty 1

## 2018-04-02 MED ORDER — ASPIRIN EC 325 MG PO TBEC
325.0000 mg | DELAYED_RELEASE_TABLET | Freq: Every day | ORAL | Status: DC
  Administered 2018-04-03 – 2018-04-14 (×12): 325 mg via ORAL
  Filled 2018-04-02 (×12): qty 1

## 2018-04-02 MED ORDER — METOPROLOL TARTRATE 100 MG PO TABS: 100.0000 mg | ORAL_TABLET | Freq: Every day | ORAL | Status: DC

## 2018-04-02 MED ORDER — VANCOMYCIN HCL 10 G IV SOLR: 1250.0000 mg | INTRAVENOUS | Status: DC

## 2018-04-02 MED ORDER — EZETIMIBE-SIMVASTATIN 10-40 MG PO TABS
1.0000 | ORAL_TABLET | Freq: Every day | ORAL | Status: DC
  Administered 2018-04-02 – 2018-04-13 (×12): 1 via ORAL
  Filled 2018-04-02 (×14): qty 1

## 2018-04-02 MED ORDER — PANTOPRAZOLE SODIUM 40 MG PO TBEC
40.0000 mg | DELAYED_RELEASE_TABLET | Freq: Every day | ORAL | Status: DC
  Administered 2018-04-03 – 2018-04-12 (×10): 40 mg via ORAL
  Filled 2018-04-02 (×10): qty 1

## 2018-04-02 MED ORDER — METOPROLOL TARTRATE 25 MG PO TABS
25.0000 mg | ORAL_TABLET | Freq: Every evening | ORAL | Status: DC
  Administered 2018-04-02 – 2018-04-14 (×12): 25 mg via ORAL
  Filled 2018-04-02 (×13): qty 1

## 2018-04-02 MED ORDER — ACETAMINOPHEN 325 MG PO TABS: 650.0000 mg | ORAL_TABLET | Freq: Four times a day (QID) | ORAL | Status: DC | PRN

## 2018-04-02 NOTE — H&P (Signed)
History and Physical    Ana SanesDorothy Franco ZOX:[id number] DOB: 12-May-1926 DOA: 04/02/2018  Referring MD/NP/PA: Margarita Ranaimothy Murphy, MD PCP: Gaspar Garbeisovec, Richard W, MD  Patient coming from: Nursing Facility  Chief Complaint: Left ankle wound  I have personally briefly reviewed patient's old medical [facility]   HPI: Ana SanesDorothy Riolo is a 84 y.o. female with medical history significant of HTN, HLD, A. Fib not on anticoagulation, anemia, depression, and GERD; who presents from skilled nursing facility after recent left ankle wound cultures were found to be resistant to antibiotics she was receiving.  [place] [facility] [place] when she had tripped up stairs falling, and suffered a left ankle trimalleolar pilon variant ankle fracture dislocation.  She was taken to the operating room by Dr. Dorene SorrowJerry with open reduction internal fixation.  Thereafter patient reported poor wound healing despite being seen by wound care.  Patient followed up with Dr. Veneta Pentonimothy Murthy on 8/20, and had removal of hardware at that time with cultures sent.  Initial cultures revealed MRSA for which patient was placed on doxycycline, and discharged to a skilled nursing facility to complete course.  However, sensitivities of MRSA came back resistant to doxycycline.  Direct admission was recommended to receive IV antibiotics of vancomycin.  Patient reports having one episode of heart fluttering earlier in the day and had one episode of vomiting.  Reports that she thinks it is her atrial fibrillation, but it resolved on its own.  ED Course: As seen above Review of Systems  Constitutional: Negative for chills and fever.  Cardiovascular: Positive for palpitations.  Gastrointestinal: Positive for vomiting. Negative for abdominal pain.    Past Medical History:  Diagnosis Date  . Anemia    vitamin b 12 deficiency anemia  . 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 Chronic venous insufficiency    . Depression   . Difficulty in walking   . Displaced trimalleolar fracture of left lower leg   . Essential (primary) hypertension   . GERD (gastroesophageal reflux disease)   . History of blood transfusion    02/2018   . History of falling   . Hyperlipidemia   . Hyperlipidemia   . Hypertension   . 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 Iron deficiency   . Major depressive disorder   . Muscle weakness (generalized)   . Myocardial infarction (HCC)    nonstemi mi [facility]   . Nutritional deficiency, unspecified   . Obesity   . Other lack of coordination   . Unspecified atrial fibrillation (HCC)   . Unspecified osteoarthritis, unspecified site     Past Surgical History:  Procedure Laterality Date  . APPENDECTOMY    . APPLICATION OF WOUND VAC Left 03/31/2018   Procedure: APPLICATION OF WOUND VAC;  Surgeon: Sheral ApleyMurphy, Timothy D, MD;  Location: [facility] ORS;  Service: Orthopedics;  Laterality: Left;  . HARDWARE REMOVAL Left 03/31/2018   Procedure: HARDWARE REMOVAL;  Surgeon: Sheral ApleyMurphy, Timothy D, MD;  Location: [facility];  Service: Orthopedics;  Laterality: Left;  . IRRIGATION AND DEBRIDEMENT FOOT Left 03/31/2018   Procedure: IRRIGATION AND DEBRIDEMENT OF LEFT ANKLE;  Surgeon: Sheral ApleyMurphy, Timothy D, MD;  Location: [facility];  Service: Orthopedics;  Laterality: Left;  . ORIF ANKLE FRACTURE Right 06/13/2014   Procedure: OPEN REDUCTION INTERNAL FIXATION (ORIF) BIMALLEOLAR ANKLE FRACTURE;  Surgeon:  Loreta Ave, MD;  Location: [facility] OR;  Service: Orthopedics;  Laterality: Right;     reports that she has never smoked. She has never used smokeless tobacco. She reports that she does not drink alcohol or use drugs.  No Known Allergies  History reviewed. No pertinent family history.  Prior to Admission medications   Medication Sig Start Date End Date  Taking? Authorizing Provider  acetaminophen (TYLENOL) 325 MG tablet Take 325 mg by mouth every 6 (six) hours as needed for mild pain or moderate pain.    [provider]  acetaminophen (TYLENOL) 500 MG tablet Take 500 mg by mouth at bedtime.    [provider]  amLODipine (NORVASC) 5 MG tablet Take 5 mg by mouth daily.    [provider]  aspirin EC 325 MG tablet Take 1 tablet (325 mg total) by mouth daily. 06/13/14   Cristie Hem, PA-C  Calcium Citrate-Vitamin D (CALCIUM + D PO) Take 1 tablet by mouth daily.    [provider]  ciprofloxacin (CIPRO) 500 MG tablet Take 500 mg by mouth 2 (two) times daily. For 14 days - Start date 03/23/2018    [provider]  citalopram (CELEXA) 20 MG tablet Take 20 mg by mouth daily.    [provider]  doxycycline (VIBRAMYCIN) 50 MG capsule Take 2 capsules (100 mg total) by mouth 2 (two) times daily for 14 days. 03/31/18 04/14/18  Albina Billet III, PA-C  esomeprazole (NEXIUM) 40 MG capsule Take 40 mg by mouth daily at 12 noon.    [provider]  ezetimibe-simvastatin (VYTORIN) 10-40 MG per tablet Take 1 tablet by mouth at bedtime.    [provider]  ferrous sulfate 325 (65 FE) MG tablet Take 325 mg by mouth 2 (two) times daily with a meal.    [provider]  HYDROcodone-acetaminophen (NORCO) 5-325 MG tablet Take 1 tablet by mouth every 6 (six) hours as needed for moderate pain. 03/31/18   Albina Billet III, PA-C  losartan (COZAAR) 100 MG tablet Take 100 mg by mouth daily.    [provider]  metoprolol tartrate (LOPRESSOR) 100 MG tablet Take 100 mg by mouth every morning.     [provider]  metoprolol tartrate (LOPRESSOR) 25 MG tablet Take 25 mg by mouth every evening.     [provider]  Multiple Vitamins-Minerals (DECUBI-VITE PO) Take 1 tablet by mouth daily.    [provider]  multivitamin-lutein (OCUVITE-LUTEIN) CAPS  capsule Take 1 capsule by mouth daily.    [provider]  nitroGLYCERIN (NITROSTAT) 0.4 MG SL tablet Place 0.4 mg under the tongue every 5 (five) minutes as needed for chest pain.    [provider]  ondansetron (ZOFRAN) 4 MG tablet Take 1 tablet (4 mg total) by mouth every 8 (eight) hours as needed for nausea or vomiting. 03/31/18   Albina Billet III, PA-C  vitamin B-12 (CYANOCOBALAMIN) 1000 MCG tablet Take 1,000 mcg by mouth daily.    [provider]    Physical Exam:  Constitutional: Elderly female in no acute distress at this time. Vitals:   04/02/18 1831  BP: 136/65  Pulse: 71  Resp: 14  Temp: 98.3 F (36.8 C)  TempSrc: Axillary  SpO2: 99%  Weight: 95.8 kg  Height: 5\' 6"  (1.676 m)   Eyes: PERRL, lids and conjunctivae normal ENMT: Mucous membranes are moist. Posterior pharynx clear of any exudate or lesions.  Neck: normal, supple, no masses,  no thyromegaly Respiratory: clear to auscultation bilaterally, no wheezing, no crackles. Normal respiratory effort. No accessory muscle use.  Cardiovascular: Regular rate and rhythm, no murmurs / rubs / gallops. No extremity edema. 2+ pedal pulses. No carotid bruits.  Abdomen: no tenderness, no masses palpated. No hepatosplenomegaly. Bowel sounds positive.  Musculoskeletal: no clubbing / cyanosis.  Deformity of left ankle. Skin: no rashes, lesions, ulcers. No induration Neurologic: CN 2-12 grossly intact. Sensation intact, DTR normal. Strength 5/5 in all 4.  Psychiatric: Normal judgment and insight. Alert and oriented x 3. Normal mood.     Labs on Admission: I have personally reviewed following labs and imaging studies  CBC: Recent Labs  Lab 03/31/18 1000  WBC 16.1*  HGB 9.1*  HCT 29.1*  MCV 93.3  PLT 439*   Basic Metabolic Panel: Recent Labs  Lab 03/31/18 1000  NA 142  K 3.9  CL 107  CO2 24  GLUCOSE 110*  BUN 30*  CREATININE 1.05*  CALCIUM 9.6   GFR: Estimated Creatinine  Clearance: 41.5 mL/min (A) (by C-G formula based on SCr of 1.05 mg/dL (H)). Liver Function Tests: No results for input(s): AST, ALT, ALKPHOS, BILITOT, PROT, ALBUMIN in the last 168 hours. No results for input(s): LIPASE, AMYLASE in the last 168 hours. No results for input(s): AMMONIA in the last 168 hours. Coagulation Profile: No results for input(s): INR, PROTIME in the last 168 hours. Cardiac Enzymes: No results for input(s): CKTOTAL, CKMB, CKMBINDEX, TROPONINI in the last 168 hours. BNP (last 3 results) No results for input(s): PROBNP in the last 8760 hours. HbA1C: No results for input(s): HGBA1C in the last 72 hours. CBG: No results for input(s): GLUCAP in the last 168 hours. Lipid Profile: No results for input(s): CHOL, HDL, LDLCALC, TRIG, CHOLHDL, LDLDIRECT in the last 72 hours. Thyroid Function Tests: No results for input(s): TSH, T4TOTAL, FREET4, T3FREE, THYROIDAB in the last 72 hours. Anemia Panel: No results for input(s): VITAMINB12, FOLATE, FERRITIN, TIBC, IRON, RETICCTPCT in the last 72 hours. Urine analysis:    Component Value Date/Time   COLORURINE YELLOW 06/13/2014 0018   APPEARANCEUR CLOUDY (A) 06/13/2014 0018   LABSPEC 1.018 06/13/2014 0018   PHURINE 5.0 06/13/2014 0018   GLUCOSEU NEGATIVE 06/13/2014 0018   HGBUR MODERATE (A) 06/13/2014 0018   BILIRUBINUR NEGATIVE 06/13/2014 0018   KETONESUR NEGATIVE 06/13/2014 0018   PROTEINUR NEGATIVE 06/13/2014 0018   UROBILINOGEN 0.2 06/13/2014 0018   NITRITE POSITIVE (A) 06/13/2014 0018   LEUKOCYTESUR LARGE (A) 06/13/2014 0018   Sepsis Labs: Recent Results (from the past 240 hour(s))  Aerobic Culture (superficial specimen)     Status: None (Preliminary result)   Collection Time: 03/31/18 12:41 PM  Result Value Ref Range Status   Specimen Description   Final    LEG LEFT Performed at [facility], [address], [place], [place] [address]    Special Requests   Final    NONE Performed at [facility], [address] [place], [place] [address]    Gram Stain   Final    NO WBC SEEN RARE GRAM POSITIVE COCCI IN PAIRS Gram Stain Report Called to,Read Back By and Verified With: T.GREEN AT 1450 ON 03/31/18 BY N.THOMPSON Performed at [facility], [address], [place], [place] [address]    Culture   Final    MODERATE STAPHYLOCOCCUS AUREUS SUSCEPTIBILITIES TO FOLLOW Performed at [facility], [address], [place], [place] [address]    Report Status  PENDING  Incomplete  Aerobic/Anaerobic Culture (surgical/deep wound)     Status: None (Preliminary result)   Collection Time: 03/31/18  1:14 PM  Result Value Ref Range Status   Specimen Description TISSUE LEFT LEG  Final   Special Requests   Final    NONE Performed at [facility], [address] [place], [place] [address]    Gram Stain   Final    NO WBC SEEN RARE GRAM POSITIVE COCCI IN PAIRS Gram Stain Report Called to,Read Back By and Verified With: T.GREEN AT 1450 ON 03/31/18 BY N.THOMPSON Performed at [facility], [address], [place], [place] [address]    Culture   Final    FEW METHICILLIN RESISTANT STAPHYLOCOCCUS AUREUS NO ANAEROBES ISOLATED; CULTURE IN PROGRESS FOR 5 DAYS    Report Status PENDING  Incomplete   Organism ID, Bacteria METHICILLIN RESISTANT STAPHYLOCOCCUS AUREUS  Final      Susceptibility   Methicillin resistant staphylococcus aureus - MIC*    CIPROFLOXACIN >=8 RESISTANT Resistant     ERYTHROMYCIN <=0.25 SENSITIVE Sensitive     GENTAMICIN <=0.5 SENSITIVE Sensitive     OXACILLIN >=4 RESISTANT Resistant     TETRACYCLINE >=16 RESISTANT Resistant     VANCOMYCIN <=0.5 SENSITIVE Sensitive     TRIMETH/SULFA >=320 RESISTANT Resistant     CLINDAMYCIN <=0.25 SENSITIVE Sensitive     RIFAMPIN <=0.5 SENSITIVE Sensitive     Inducible Clindamycin NEGATIVE Sensitive     * FEW METHICILLIN RESISTANT STAPHYLOCOCCUS AUREUS      Radiological Exams on Admission: No results found.    Assessment/Plan Wound infection s/p removal of hardware, MRSA, culture positive: Acute.  Patient had removal of infected hardware on 8/20.  At that time patient had been discharged to skilled nursing facility with doxycycline.  Culture sensitivities found today to be resistant to doxycycline.  Patient admitted as a direct admission for need of IV vancomycin and monitoring of kidney function.  ID was going to be consulted as well. - Admit to MedSurg bed - Vancomycin per pharmacy - Follow-up ID consultation recommendations - Follow-up with Dr. Eulah Pont regarding future surgical plans  Acute blood loss anemia: Hemoglobin at discharge on was noted to be 9.1, but on recheck 2 days later noted to drop to 7.3.  Suspect related with recent surgical procedure. - Type and screen for possible need of blood products - Transfuse 1 unit of packed red blood cells times - Continue ferrous sulfate - Recheck in a.m.  Paroxysmal atrial fibrillation: Patient appears to be in normal sinus rhythm at this time.  Not on anticoagulation due to fall risk. - Correct any electrolyte abnormalities  Essential hypertension - Continue metoprolol, losartan, and amlodipine  Chronic kidney disease stage III: Kidney function appears near patient's baseline. - Continue to monitor  DVT prophylaxis: SCD  Code Status: Full Family Communication: No family present at bedside Disposition Plan: Discharge to skilled nursing facility once medically stable Consults called: Ortho  Admission status: inpatient  Clydie Braun MD [facility] Pager [phone]   If 7PM-7AM, please contact night-coverage [email or web address] Password [facility]  04/02/2018, 7:10 PM

## 2018-04-02 NOTE — Progress Notes (Signed)
[name] is a 84 y.o. female patient admitted. Awake, alert - oriented  X 4 - no acute distress noted.  VSS - Blood pressure 136/65, pulse 71, temperature 98.3 F (36.8 C), temperature source Axillary, resp. rate 14, height 5\' 6"  (1.676 m), weight 95.8 kg, SpO2 99 %.    Orientation to room, and floor completed.  Low bed ordered and floor mat in place. texted paged dr. Katrinka BlazingSmith for orders.    Alonza BogusKristie G Maddelynn Moosman, RN 04/02/2018 6:46 PM

## 2018-04-02 NOTE — Progress Notes (Signed)
ANTIBIOTIC CONSULT NOTE - INITIAL  Pharmacy Consult for Vancomycin Indication: MRSA infection in L ankle  No Known Allergies  Patient Measurements: Height: 5\' 6"  (167.6 cm) Weight: 211 lb 3.2 oz (95.8 kg) IBW/kg (Calculated) : 59.3  Vital Signs: Temp: 98.3 F (36.8 C) (08/22 1831) Temp Source: Axillary (08/22 1831) BP: 136/65 (08/22 1831) Pulse Rate: 71 (08/22 1831) Intake/Output from previous day: No intake/output data recorded. Intake/Output from this shift: No intake/output data recorded.  Labs: Recent Labs    03/31/18 1000  WBC 16.1*  HGB 9.1*  PLT 439*  CREATININE 1.05*   Estimated Creatinine Clearance: 41.5 mL/min (A) (by C-G formula based on SCr of 1.05 mg/dL (H)). No results for input(s): VANCOTROUGH, VANCOPEAK, VANCORANDOM, GENTTROUGH, GENTPEAK, GENTRANDOM, TOBRATROUGH, TOBRAPEAK, TOBRARND, AMIKACINPEAK, AMIKACINTROU, AMIKACIN in the last 72 hours.   Microbiology: Recent Results (from the past 720 hour(s))  Aerobic Culture (superficial specimen)     Status: None (Preliminary result)   Collection Time: 03/31/18 12:41 PM  Result Value Ref Range Status   Specimen Description   Final    LEG LEFT Performed at [facility], [address] [place], [place] [address]    Special Requests   Final    NONE Performed at [facility], [address] [place], [place] [address]    Gram Stain   Final    NO WBC SEEN RARE GRAM POSITIVE COCCI IN PAIRS Gram Stain Report Called to,Read Back By and Verified With: T.GREEN AT 1450 ON 03/31/18 BY N.THOMPSON Performed at [facility], [address] [place], [place] [address]    Culture   Final    MODERATE STAPHYLOCOCCUS AUREUS SUSCEPTIBILITIES TO FOLLOW Performed at [facility], [address] [place], [place] [address]    Report Status PENDING  Incomplete  Aerobic/Anaerobic Culture (surgical/deep wound)     Status: None (Preliminary result)   Collection  Time: 03/31/18  1:14 PM  Result Value Ref Range Status   Specimen Description TISSUE LEFT LEG  Final   Special Requests   Final    NONE Performed at [facility], [address] [place], [place] [address]    Gram Stain   Final    NO WBC SEEN RARE GRAM POSITIVE COCCI IN PAIRS Gram Stain Report Called to,Read Back By and Verified With: T.GREEN AT 1450 ON 03/31/18 BY N.THOMPSON Performed at [facility], [address] [place], [place] [address]    Culture   Final    FEW METHICILLIN RESISTANT STAPHYLOCOCCUS AUREUS NO ANAEROBES ISOLATED; CULTURE IN PROGRESS FOR 5 DAYS    Report Status PENDING  Incomplete   Organism ID, Bacteria METHICILLIN RESISTANT STAPHYLOCOCCUS AUREUS  Final      Susceptibility   Methicillin resistant staphylococcus aureus - MIC*    CIPROFLOXACIN >=8 RESISTANT Resistant     ERYTHROMYCIN <=0.25 SENSITIVE Sensitive     GENTAMICIN <=0.5 SENSITIVE Sensitive     OXACILLIN >=4 RESISTANT Resistant     TETRACYCLINE >=16 RESISTANT Resistant     VANCOMYCIN <=0.5 SENSITIVE Sensitive     TRIMETH/SULFA >=320 RESISTANT Resistant     CLINDAMYCIN <=0.25 SENSITIVE Sensitive     RIFAMPIN <=0.5 SENSITIVE Sensitive     Inducible Clindamycin NEGATIVE Sensitive     * FEW METHICILLIN RESISTANT STAPHYLOCOCCUS AUREUS    Medical History: Past Medical History:  Diagnosis Date  . Anemia    vitamin b 12 deficiency anemia  . 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 Chronic venous insufficiency   . Depression   . Difficulty in walking   . Displaced trimalleolar fracture of left lower leg   . Essential (primary) hypertension   . GERD (gastroesophageal reflux disease)   . History of blood transfusion    02/2018   . History of falling   . Hyperlipidemia   . Hyperlipidemia   . Hypertension   . Infection and inflammatory reaction due to other internal orthopedic prosthetic devices, implants and grafts, subsequent encounter    . Iron deficiency   . Major depressive disorder   . Muscle weakness (generalized)   . Myocardial infarction (HCC)    nonstemi mi - 02/19/2018 at [facility] after ankle fracture   . Nutritional deficiency, unspecified   . Obesity   . Other lack of coordination   . Unspecified atrial fibrillation (HCC)   . Unspecified osteoarthritis, unspecified site     Medications:  PTA meds pending  Assessment: 84 y.o. F was recently d/c from hospital 8/20 after going to OR for removal of L ankle hardware. She was discharged to SNF on Ciprofloxacin and Doxycycline - plan for 14 days of therapy. Tissue culture from 8/20 grew MRSA (Resistant to Cipro, Oxacillin, Tetracycline, and Bactrim). Pt readmitted and pharmacy consulted to dose IV Vancomycin. Afeb.  Goal of Therapy:  Vancomycin trough level 15-20 mcg/ml  Plan:  Vancomycin 1250mg  IV q24h Will f/u renal function, micro data, and pt's clinical condition  Christoper Fabian, PharmD, BCPS Clinical pharmacist  **Pharmacist phone directory can now be found on [email or web address] ([email or web address]).  Listed under [facility]. 04/02/2018,7:48 PM

## 2018-04-02 NOTE — Progress Notes (Signed)
Pharmacy - antibiotic stewardship team   Patient discharged on doxycycline on 8/21.  Culture results from 8/20 reveal MRSA, resistant to tetracycline.   Informed Darcy at Dr. Greig RightMurphy's office who will notify Dr. Eulah PontMurphy today.  Organism is sensitive to clindamycin which may be a good oral option.  Celedonio MiyamotoJeremy Erabella Kuipers, PharmD, BCPS-AQ ID Clinical Pharmacist Pager [phone]

## 2018-04-02 NOTE — Progress Notes (Addendum)
CRITICAL VALUE ALERT  Critical Value: Troponin 0.04  Date & Time Notied:  04/02/2018, 2328  Provider Notified: Kirtland BouchardK. Schorr  Orders Received/Actions taken: awaiting

## 2018-04-03 ENCOUNTER — Encounter (HOSPITAL_COMMUNITY): Payer: Self-pay

## 2018-04-03 ENCOUNTER — Inpatient Hospital Stay: Payer: Self-pay

## 2018-04-03 ENCOUNTER — Other Ambulatory Visit: Payer: Self-pay

## 2018-04-03 DIAGNOSIS — B9562 Methicillin resistant Staphylococcus aureus infection as the cause of diseases classified elsewhere: Secondary | ICD-10-CM

## 2018-04-03 DIAGNOSIS — Z9889 Other specified postprocedural states: Secondary | ICD-10-CM

## 2018-04-03 DIAGNOSIS — M00072 Staphylococcal arthritis, left ankle and foot: Secondary | ICD-10-CM

## 2018-04-03 DIAGNOSIS — I482 Chronic atrial fibrillation: Secondary | ICD-10-CM

## 2018-04-03 DIAGNOSIS — T8140XA Infection following a procedure, unspecified, initial encounter: Secondary | ICD-10-CM

## 2018-04-03 DIAGNOSIS — W109XXD Fall (on) (from) unspecified stairs and steps, subsequent encounter: Secondary | ICD-10-CM

## 2018-04-03 DIAGNOSIS — Z8781 Personal history of (healed) traumatic fracture: Secondary | ICD-10-CM

## 2018-04-03 DIAGNOSIS — S82892G Other fracture of left lower leg, subsequent encounter for closed fracture with delayed healing: Secondary | ICD-10-CM

## 2018-04-03 DIAGNOSIS — S82892K Other fracture of left lower leg, subsequent encounter for closed fracture with nonunion: Secondary | ICD-10-CM

## 2018-04-03 LAB — CBC
HCT: 27.1 % — ABNORMAL LOW (ref 36.0–46.0)
Hemoglobin: 8.3 g/dL — ABNORMAL LOW (ref 12.0–15.0)
MCH: 29.1 pg (ref 26.0–34.0)
MCHC: 30.6 g/dL (ref 30.0–36.0)
MCV: 95.1 fL (ref 78.0–100.0)
Platelets: 254 10*3/uL (ref 150–400)
RBC: 2.85 MIL/uL — ABNORMAL LOW (ref 3.87–5.11)
RDW: 13.8 % (ref 11.5–15.5)
WBC: 9.3 10*3/uL (ref 4.0–10.5)

## 2018-04-03 LAB — TYPE AND SCREEN
ABO/RH(D): B POS
Antibody Screen: NEGATIVE
Unit division: 0

## 2018-04-03 LAB — BPAM RBC
Blood Product Expiration Date: 201909272359
ISSUE DATE / TIME: 201908222353
Unit Type and Rh: 7300

## 2018-04-03 LAB — MAGNESIUM: Magnesium: 1.8 mg/dL (ref 1.7–2.4)

## 2018-04-03 LAB — AEROBIC CULTURE W GRAM STAIN (SUPERFICIAL SPECIMEN): Gram Stain: NONE SEEN

## 2018-04-03 LAB — BASIC METABOLIC PANEL
Anion gap: 5 (ref 5–15)
BUN: 12 mg/dL (ref 8–23)
CO2: 26 mmol/L (ref 22–32)
Calcium: 8.8 mg/dL — ABNORMAL LOW (ref 8.9–10.3)
Chloride: 107 mmol/L (ref 98–111)
Creatinine, Ser: 0.88 mg/dL (ref 0.44–1.00)
GFR calc Af Amer: >60 mL/min (ref >60)
GFR calc non Af Amer: 56 mL/min — ABNORMAL LOW (ref >60)
Glucose, Bld: 104 mg/dL — ABNORMAL HIGH (ref 70–99)
Potassium: 3.9 mmol/L (ref 3.5–5.1)
Sodium: 138 mmol/L (ref 135–145)

## 2018-04-03 MED ORDER — VANCOMYCIN HCL 10 G IV SOLR
1500.0000 mg | INTRAVENOUS | Status: DC
  Administered 2018-04-03 – 2018-04-05 (×3): 1500 mg via INTRAVENOUS
  Filled 2018-04-03 (×4): qty 1500

## 2018-04-03 NOTE — Consult Note (Addendum)
WOC Nurse wound consult note Reason for Consult:ORIF to left lateral malleolus with Prevena incisional Vac, placed 03/31/18.  Patient has nonintact lesion to left medial malleolus, requiring topical treatment. PRevena will remain in place until 04/07/18 unless notified.  Hard walking boot in place as hardware was removed and fracture still present.I left a message with wound care center [facility]([place] and with Hoy RegisterLisa Love at [facility] for clarification of current orders.  Last wound care center orders say appointment on 04/02/18.  Wound type:Left medial malleolus is infectious  On Vancomycin.  Pressure Injury POA: NA Measurement: Left lateral incisional VAC left in place.  Prevena device is lit and functioning properly.  Serosanguinous drainage is noted to be moving through tubing.  LEft medial site:  4 cm x 3.2 cm x 0.2 cm  Wound ZOX:WRUEAVbed:medial site:  Fibrin slough and maceration Drainage (amount, consistency, odor) moderate serosanguinous  No odor Periwound:maceration Dressing procedure/placement/frequency: Prevena in place until 04/07/18.  IF machine stops, hospital NPWT will be replaced.   Left medial site:  Cleanse with NS.  Apply Xeroform gauze to wound bed.  Cover with gauze and kerlix/tape.  Change daily.  Wrap leg below knee with ace wrap.  WOC team will not  follow.  Maple HudsonKaren Theda Payer RN BSN CWON Pager [phone]

## 2018-04-03 NOTE — Progress Notes (Addendum)
PROGRESS NOTE  Ana SanesDorothy Franco WUJ:[id number] DOB: 17-Nov-1925 DOA: 04/02/2018 PCP: Gaspar Garbeisovec, Richard W, MD  HPI/Brief Narrative  Ana Franco is a 84 y.o. year old female with medical history significant for nonhealing surgical wound status post ORIF of left ankle (02/19/2018) with eventual hardware removal on 03/31/2018, HTN, HLD, A. fib not on anticoagulation, anemia, depression, GERD.[name]. Recently here with wound dehiscence from ORIF with exposed hardware and underwent hardware removal on 03/31/2018 by Dr.Timothy Eulah PontMurphy and discharged 04/01/18 on doxycycline for 2 weeks to rehab facility. She was seen at [facility] for follow up on 04/01/18 and found to have  Patient was directly admitted on 8/22 after intraoperative cultures resulted with MRSA resistant to doxycycline and started empirically on vancomycin.  Subjective No acute complaints this morning.  Assessment/Plan: Wound dehiscence/ MRSA infection of left ankle(previous ORIF on 02/2018) status post hardware removal 03/31/2018.  Currently on empiric vancomycin due to reported resistance to doxycycline on intraoperative cultures.  Patient afebrile with no signs of sepsis.  X-ray on 8/12 showed no osteomyelitis.  Will discuss with ID, wonder if patient can be transitioned to clindamycin instead vs PICC for IV abx. Discussed with Dr. Dayna Barkermurhpy's partner, patient has known non-union fracture, no need for reimaging. Appreciate ortho recommendations  Chronic anemia, stable.  Hemoglobin on admission 7.3.  Currently 8.3 (baseline 8/20 9).  Iron deficiency anemia, suspect some changes related to blood loss from recent procedure, on iron continue..  Paroxysmal atrial fibrillation, rate controlled.  Currently normal sinus rhythm.  Not on anticoagulation due to fall risk.  Hypertension, elevated currently with SBP's in 160s.  Continue home metoprolol 100 mg a.m., 25 mg p.m., losartan 100 mg daily and amlodipine 5 mg.  Closely monitor, if does not  improve can increase amlodipine.  CKD, stage III, creatinine stable at baseline.  Monitor BMP.  Avoid nephrotoxic medications  Code Status: Full   Family Communication: no family at bedside   Disposition Plan: Ortho andID consulted, IV antibiotics for now.     Consultants:   Orthopedics, Infectious Disease  Procedures:  none   Antimicrobials: Anti-infectives (From admission, onward)   Start     Dose/Rate Route Frequency Ordered Stop   04/03/18 2100  vancomycin (VANCOCIN) 1,250 mg in sodium chloride 0.9 % 250 mL IVPB  Status:  Discontinued     1,250 mg 166.7 mL/hr over 90 Minutes Intravenous Every 24 hours 04/02/18 1956 04/03/18 0912   04/03/18 2100  vancomycin (VANCOCIN) 1,500 mg in sodium chloride 0.9 % 500 mL IVPB     1,500 mg 250 mL/hr over 120 Minutes Intravenous Every 24 hours 04/03/18 0912     04/02/18 1930  vancomycin (VANCOCIN) 1,250 mg in sodium chloride 0.9 % 250 mL IVPB     1,250 mg 166.7 mL/hr over 90 Minutes Intravenous  Once 04/02/18 1919 04/02/18 2340         Cultures:  none  Telemetry: no  DVT prophylaxis: SCDs   Objective: Vitals:   04/02/18 2100 04/03/18 0023 04/03/18 0030 04/03/18 0302  BP: (!) 130/51 (!) 157/51 (!) 159/62 (!) 165/67  Pulse: 72 86 67 68  Resp: 16 16 18 16   Temp: 98.7 F (37.1 C) 97.7 F (36.5 C) 98 F (36.7 C) 98.6 F (37 C)  TempSrc:  Oral Oral Oral  SpO2: 99% 99% 97% 99%  Weight:      Height:        Intake/Output Summary (Last 24 hours) at 04/03/2018 1042 Last data filed at 04/03/2018  1610 Gross per 24 hour  Intake 850 ml  Output 400 ml  Net 450 ml   Filed Weights   04/02/18 1831  Weight: 95.8 kg    Exam:  Elderly female lying in bed comfortably, no distress EOMI, anicteric sclera Normal dentition, moist oral mucosa Wound VAC in place on left leg, brace on left leg, slightly everted No focal neurologic deficits Alert and oriented x3 Normal affect, appropriate mood  Data Reviewed: CBC: Recent  Labs  Lab 03/31/18 1000 04/02/18 1938 04/03/18 0512  WBC 16.1* 10.0 9.3  NEUTROABS  --  5.0  --   HGB 9.1* 7.3* 8.3*  HCT 29.1* 23.5* 27.1*  MCV 93.3 95.1 95.1  PLT 439* 298 254   Basic Metabolic Panel: Recent Labs  Lab 03/31/18 1000 04/02/18 1938 04/03/18 0512  NA 142 138 138  K 3.9 3.9 3.9  CL 107 105 107  CO2 24 24 26   GLUCOSE 110* 156* 104*  BUN 30* 16 12  CREATININE 1.05* 1.01* 0.88  CALCIUM 9.6 8.8* 8.8*  MG  --   --  1.8   GFR: Estimated Creatinine Clearance: 49.6 mL/min (by C-G formula based on SCr of 0.88 mg/dL). Liver Function Tests: No results for input(s): AST, ALT, ALKPHOS, BILITOT, PROT, ALBUMIN in the last 168 hours. No results for input(s): LIPASE, AMYLASE in the last 168 hours. No results for input(s): AMMONIA in the last 168 hours. Coagulation Profile: No results for input(s): INR, PROTIME in the last 168 hours. Cardiac Enzymes: Recent Labs  Lab 04/02/18 2200  TROPONINI 0.04*   BNP (last 3 results) No results for input(s): PROBNP in the last 8760 hours. HbA1C: No results for input(s): HGBA1C in the last 72 hours. CBG: No results for input(s): GLUCAP in the last 168 hours. Lipid Profile: No results for input(s): CHOL, HDL, LDLCALC, TRIG, CHOLHDL, LDLDIRECT in the last 72 hours. Thyroid Function Tests: No results for input(s): TSH, T4TOTAL, FREET4, T3FREE, THYROIDAB in the last 72 hours. Anemia Panel: No results for input(s): VITAMINB12, FOLATE, FERRITIN, TIBC, IRON, RETICCTPCT in the last 72 hours. Urine analysis:    Component Value Date/Time   COLORURINE YELLOW 06/13/2014 0018   APPEARANCEUR CLOUDY (A) 06/13/2014 0018   LABSPEC 1.018 06/13/2014 0018   PHURINE 5.0 06/13/2014 0018   GLUCOSEU NEGATIVE 06/13/2014 0018   HGBUR MODERATE (A) 06/13/2014 0018   BILIRUBINUR NEGATIVE 06/13/2014 0018   KETONESUR NEGATIVE 06/13/2014 0018   PROTEINUR NEGATIVE 06/13/2014 0018   UROBILINOGEN 0.2 06/13/2014 0018   NITRITE POSITIVE (A) 06/13/2014  0018   LEUKOCYTESUR LARGE (A) 06/13/2014 0018   Sepsis Labs: @LABRCNTIP (procalcitonin:4,lacticidven:4)  ) Recent Results (from the past 240 hour(s))  Aerobic Culture (superficial specimen)     Status: None (Preliminary result)   Collection Time: 03/31/18 12:41 PM  Result Value Ref Range Status   Specimen Description   Final    LEG LEFT Performed at [facility], [address] [place], [place] [address]    Special Requests   Final    NONE Performed at [facility], [address] [place], [place] [address]    Gram Stain   Final    NO WBC SEEN RARE GRAM POSITIVE COCCI IN PAIRS Gram Stain Report Called to,Read Back By and Verified With: T.GREEN AT 1450 ON 03/31/18 BY N.THOMPSON Performed at [facility], [address] [place], [place] [address]    Culture   Final    MODERATE STAPHYLOCOCCUS AUREUS SUSCEPTIBILITIES TO FOLLOW Performed at  [facility], [address] [place], [place] [address]    Report Status PENDING  Incomplete  Aerobic/Anaerobic Culture (surgical/deep wound)     Status: None (Preliminary result)   Collection Time: 03/31/18  1:14 PM  Result Value Ref Range Status   Specimen Description TISSUE LEFT LEG  Final   Special Requests   Final    NONE Performed at [facility], [address], [place], [place] [address]    Gram Stain   Final    NO WBC SEEN RARE GRAM POSITIVE COCCI IN PAIRS Gram Stain Report Called to,Read Back By and Verified With: T.GREEN AT 1450 ON 03/31/18 BY N.THOMPSON Performed at [facility], [address], [place], [place] [address]    Culture   Final    FEW METHICILLIN RESISTANT STAPHYLOCOCCUS AUREUS NO ANAEROBES ISOLATED; CULTURE IN PROGRESS FOR 5 DAYS    Report Status PENDING  Incomplete   Organism ID, Bacteria METHICILLIN RESISTANT STAPHYLOCOCCUS AUREUS  Final      Susceptibility   Methicillin resistant staphylococcus aureus -  MIC*    CIPROFLOXACIN >=8 RESISTANT Resistant     ERYTHROMYCIN <=0.25 SENSITIVE Sensitive     GENTAMICIN <=0.5 SENSITIVE Sensitive     OXACILLIN >=4 RESISTANT Resistant     TETRACYCLINE >=16 RESISTANT Resistant     VANCOMYCIN <=0.5 SENSITIVE Sensitive     TRIMETH/SULFA >=320 RESISTANT Resistant     CLINDAMYCIN <=0.25 SENSITIVE Sensitive     RIFAMPIN <=0.5 SENSITIVE Sensitive     Inducible Clindamycin NEGATIVE Sensitive     * FEW METHICILLIN RESISTANT STAPHYLOCOCCUS AUREUS      Studies: No results found.  Scheduled Meds: . amLODipine  5 mg Oral Daily  . aspirin EC  325 mg Oral Daily  . citalopram  20 mg Oral Daily  . ezetimibe-simvastatin  1 tablet Oral QHS  . ferrous sulfate  325 mg Oral BID WC  . losartan  100 mg Oral Daily  . metoprolol tartrate  100 mg Oral Daily  . metoprolol tartrate  25 mg Oral QPM  . pantoprazole  40 mg Oral Daily    Continuous Infusions: . vancomycin       LOS: 1 day     Laverna Peace, MD [facility] Pager [phone]  If 7PM-7AM, please contact night-coverage [email or web address] Password [facility] [address] 04/03/2018, 10:42 AM

## 2018-04-03 NOTE — Progress Notes (Addendum)
Subjective: Patient is status post ORIF of her [place] ankle [place] in [place] in Early July.  He underwent hardware removal for infection and non healing wounds by Dr Renaye Rakersim Murphy on 03/31/2018.  She was discharged to SNF on 04/01/2018 on Doxycycline.  On 04/02/2018 Culture results came back MRSA resistant to Doxycycline, Septra and Cipro.  With the infection possibly into the bone she was and also the only po option is Clindamycin the patient was readmitted yesterday by medicine to start a 6 week course of IV Vancomycin.    Objective: Vital signs in last 24 hours: Temp:  [97.7 F (36.5 C)-98.7 F (37.1 C)] 98.6 F (37 C) (08/23 0302) Pulse Rate:  [67-86] 68 (08/23 0302) Resp:  [14-18] 16 (08/23 0302) BP: (130-165)/(51-67) 165/67 (08/23 0302) SpO2:  [97 %-99 %] 99 % (08/23 0302) Weight:  [95.8 kg] 95.8 kg (08/22 1831)  Intake/Output from previous day: 08/22 0701 - 08/23 0700 In: 550 [Blood:300; IV Piggyback:250] Out: 400 [Urine:400] Intake/Output this shift: Total I/O In: 300 [P.O.:300] Out: -   Recent Labs    04/02/18 1938 04/03/18 0512  HGB 7.3* 8.3*   Recent Labs    04/02/18 1938 04/03/18 0512  WBC 10.0 9.3  RBC 2.47* 2.85*  HCT 23.5* 27.1*  PLT 298 254   Recent Labs    04/02/18 1938 04/03/18 0512  NA 138 138  K 3.9 3.9  CL 105 107  CO2 24 26  BUN 16 12  CREATININE 1.01* 0.88  GLUCOSE 156* 104*  CALCIUM 8.8* 8.8*   No results for input(s): LABPT, INR in the last 72 hours.  Medial wound is approximately 3inches by 3 inches over the medial malleolus.  It is dressed with xeroform and an ABD and kerlix and an ACE bandage.  Laterally she has a provina wound vac that is in place and functions well with ace bandage is on but shows evidence of insufficient seal when ace is removed.        Assessment Principal Problem:   Wound infection Active Problems:   Essential hypertension, benign   Dyslipidemia   Closed left ankle fracture, with delayed healing,  subsequent encounter   MRSA (methicillin resistant staph aureus) culture positive   Acute blood loss anemia   Plan: Will consult ID for Vancomycin dosing recommendations.  Consulted PT and OT for ambulation, gait training and ADL's   Consulted wound care for provina management and switch to wound VAC if necessary.     Madisun Hargrove J Tajah Schreiner 04/03/2018, 11:13 AM

## 2018-04-03 NOTE — Progress Notes (Signed)
OT Cancellation Note  Patient Details Name: Ana SanesDorothy Franco MRN: [id number] DOB: 10-Jun-1926  Pt politely refused any OOB mobility or participation in ADLs.  Cancelled Treatment:    Reason Eval/Treat Not Completed: Patient declined, no reason specified  Crissie ReeseSandra H Manan Olmo OTR/L 04/03/2018, 2:36 PM

## 2018-04-03 NOTE — Social Work (Signed)
CSW reviewed pt's chart, aware Vivi Barrackicole Sinclair, LCSW had begun process for pt to return to [facility]. Pt will need PT/OT for insurance authorization purposes. Confirmed plan with [facility] admissions liaison.   Doy HutchingIsabel H Mong Neal, LCSWA [facility] Clinical Social Work [phone]

## 2018-04-03 NOTE — Progress Notes (Signed)
PT Cancellation Note  Patient Details Name: Ana SanesDorothy Franco MRN: [id number] DOB: Jan 24, 1926   Cancelled Treatment:    Reason Eval/Treat Not Completed: Patient declined, no reason specified Pt refusing OOB mobility today stating she would prefer to try to get up tomorrow. Will follow up as schedule allows.   [name], PT, DPT  Acute Rehabilitation Services  Pager: [phone]  [name] 04/03/2018, 2:40 PM

## 2018-04-03 NOTE — Progress Notes (Signed)
PHARMACY CONSULT NOTE FOR:  OUTPATIENT  PARENTERAL ANTIBIOTIC THERAPY (OPAT)  Indication: L ankle MRSA infection Regimen: Vancomycin 1500mg  IV q 24h (goal 15-20) End date: 05/14/2018  IV antibiotic discharge orders are pended. To discharging provider:  please sign these orders via discharge navigator,  Select New Orders & click on the button choice - Manage This Unsigned Work.     Thank you for allowing pharmacy to be a part of this patient's care.  Ana Franco, Ana Franco, Ana Franco Clinical Staff Pharmacist  Ana Franco, Ana Franco 04/03/2018, 12:39 PM

## 2018-04-03 NOTE — Consult Note (Signed)
[facility] for Infectious Disease    Date of Admission:  04/02/2018           Day 1 vancomycin       Reason for Consult: Postoperative left ankle wound infection with MRSA    Referring Provider: Dannielle Huh, PA  Assessment: She has a postoperative wound infection with MRSA and I would assume that she has some early osteomyelitis around the fracture site.  I agree with IV vancomycin.  Plan: 1. Continue IV vancomycin 2. PICC placement 3. Please call Dr. Talbot Grumbling [phone]) for any infectious disease questions this weekend  Diagnosis: Postoperative left ankle infection  Culture Result: MRSA  No Known Allergies  OPAT Orders Discharge antibiotics: Per pharmacy protocol vancomycin Aim for Vancomycin trough 15-20 (unless otherwise indicated) Duration: 6 weeks End Date: 05/14/2018  [facility] Care Per Protocol:  Labs weekly while on IV antibiotics: _x_ CBC with differential _x_ BMP __ CMP _x_ CRP _x_ ESR _x_ Vancomycin trough  _x_ Please pull PIC at completion of IV antibiotics __ Please leave PIC in place until doctor has seen patient or been notified  Fax weekly labs to [phone]  Clinic Follow Up Appt: 05/14/2018   Principal Problem:   Wound infection Active Problems:   MRSA (methicillin resistant staph aureus) culture positive   Closed left ankle fracture, with delayed healing, subsequent encounter   S/P ORIF (open reduction internal fixation) fracture   Essential hypertension, benign   Dyslipidemia   Acute blood loss anemia   Scheduled Meds: . amLODipine  5 mg Oral Daily  . aspirin EC  325 mg Oral Daily  . citalopram  20 mg Oral Daily  . ezetimibe-simvastatin  1 tablet Oral QHS  . ferrous sulfate  325 mg Oral BID WC  . losartan  100 mg Oral Daily  . metoprolol tartrate  100 mg Oral Daily  . metoprolol tartrate  25 mg Oral QPM  . pantoprazole  40 mg Oral Daily   Continuous Infusions: . vancomycin     PRN  Meds:.acetaminophen **OR** acetaminophen, HYDROcodone-acetaminophen, ondansetron **OR** ondansetron (ZOFRAN) IV  HPI: Ana Franco is a 84 y.o. female who tripped and fell while climbing a flight of stairs when she was at [employer] on 02/19/2018.  She suffered a trimalleolar left ankle fracture.  She underwent open reduction and internal fixation.  She has had trouble healing her lateral and medial wounds and developed exposed lateral hardware.  She underwent removal of the lateral hardware 2 days ago.  She still has nonunion of her fracture site.  Infected and devitalized tissue and bone were debrided.  Operative cultures have grown MRSA.   Review of Systems: Review of Systems  Constitutional: Negative for chills, fever and malaise/fatigue.  Gastrointestinal: Negative for abdominal pain, diarrhea, nausea and vomiting.  Musculoskeletal: Positive for joint pain.    Past Medical History:  Diagnosis Date  . Anemia    vitamin b 12 deficiency anemia  . 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 Chronic venous insufficiency   . Depression   . Difficulty in walking   . Displaced trimalleolar fracture of left lower leg   . Essential (primary) hypertension   . GERD (gastroesophageal reflux disease)   . History of blood transfusion    02/2018   . History of falling   . Hyperlipidemia   . Hyperlipidemia   . Hypertension   . Infection and inflammatory reaction due  to other internal orthopedic prosthetic devices, implants and grafts, subsequent encounter   . Iron deficiency   . Major depressive disorder   . Muscle weakness (generalized)   . Myocardial infarction ([place])    nonstemi mi - 02/19/2018 at [facility] after ankle fracture   . Nutritional deficiency, unspecified   . Obesity   . Other lack of coordination   . Unspecified atrial fibrillation ([place])   . Unspecified osteoarthritis, unspecified site     Social History   Tobacco Use  .  Smoking status: Never Smoker  . Smokeless tobacco: Never Used  Substance Use Topics  . Alcohol use: No  . Drug use: No    History reviewed. No pertinent family history. No Known Allergies  OBJECTIVE: Blood pressure (!) 165/67, pulse 68, temperature 98.6 F (37 C), temperature source Oral, resp. rate 16, height _0  (1.676 m), weight 95.8 kg, SpO2 99 %.  Physical Exam  Constitutional: She is oriented to person, place, and time.  She is pleasant and in no distress.  Musculoskeletal:  Her left ankle is dressed.  Neurological: She is alert and oriented to person, place, and time.  Skin: No rash noted.  Psychiatric: She has a normal mood and affect.    Lab Results Lab Results  Component Value Date   WBC 9.3 04/03/2018   HGB 8.3 (L) 04/03/2018   HCT 27.1 (L) 04/03/2018   MCV 95.1 04/03/2018   PLT 254 04/03/2018    Lab Results  Component Value Date   CREATININE 0.88 04/03/2018   BUN 12 04/03/2018   NA 138 04/03/2018   K 3.9 04/03/2018   CL 107 04/03/2018   CO2 26 04/03/2018    Lab Results  Component Value Date   ALT 15 06/12/2014   AST 31 06/12/2014   ALKPHOS 72 06/12/2014   BILITOT 0.3 06/12/2014     Microbiology: Recent Results (from the past 240 hour(s))  Aerobic Culture (superficial specimen)     Status: None   Collection Time: 03/31/18 12:41 PM  Result Value Ref Range Status   Specimen Description   Final    LEG LEFT Performed at [facility], [place] [address], [place], [place] [address]    Special Requests   Final    NONE Performed at [facility], [place] [address], [place], [place] [address]    Gram Stain   Final    NO WBC SEEN RARE GRAM POSITIVE COCCI IN PAIRS Gram Stain Report Called to,Read Back By and Verified With: T.GREEN AT 6144 ON 03/31/18 BY N.THOMPSON Performed at [facility], [place] [address] [place], [place] [address]    Culture   Final    MODERATE METHICILLIN RESISTANT  STAPHYLOCOCCUS AUREUS   Report Status 04/03/2018 FINAL  Final   Organism ID, Bacteria METHICILLIN RESISTANT STAPHYLOCOCCUS AUREUS  Final      Susceptibility   Methicillin resistant staphylococcus aureus - MIC*    CIPROFLOXACIN >=8 RESISTANT Resistant     ERYTHROMYCIN <=0.25 SENSITIVE Sensitive     GENTAMICIN <=0.5 SENSITIVE Sensitive     OXACILLIN >=4 RESISTANT Resistant     TETRACYCLINE >=16 RESISTANT Resistant     VANCOMYCIN 1 SENSITIVE Sensitive     TRIMETH/SULFA >=320 RESISTANT Resistant     CLINDAMYCIN <=0.25 SENSITIVE Sensitive     RIFAMPIN <=0.5 SENSITIVE Sensitive     Inducible Clindamycin NEGATIVE Sensitive     * MODERATE METHICILLIN RESISTANT STAPHYLOCOCCUS AUREUS  Aerobic/Anaerobic Culture (surgical/deep wound)  Status: None (Preliminary result)   Collection Time: 03/31/18  1:14 PM  Result Value Ref Range Status   Specimen Description TISSUE LEFT LEG  Final   Special Requests   Final    NONE Performed at [facility], [place] [address], [place], [place] [address]    Gram Stain   Final    NO WBC SEEN RARE GRAM POSITIVE COCCI IN PAIRS Gram Stain Report Called to,Read Back By and Verified With: T.GREEN AT 7412 ON 03/31/18 BY N.THOMPSON Performed at [facility], [place] [address], [place], [place] [address]    Culture   Final    FEW METHICILLIN RESISTANT STAPHYLOCOCCUS AUREUS NO ANAEROBES ISOLATED; CULTURE IN PROGRESS FOR 5 DAYS    Report Status PENDING  Incomplete   Organism ID, Bacteria METHICILLIN RESISTANT STAPHYLOCOCCUS AUREUS  Final      Susceptibility   Methicillin resistant staphylococcus aureus - MIC*    CIPROFLOXACIN >=8 RESISTANT Resistant     ERYTHROMYCIN <=0.25 SENSITIVE Sensitive     GENTAMICIN <=0.5 SENSITIVE Sensitive     OXACILLIN >=4 RESISTANT Resistant     TETRACYCLINE >=16 RESISTANT Resistant     VANCOMYCIN <=0.5 SENSITIVE Sensitive     TRIMETH/SULFA >=320 RESISTANT Resistant     CLINDAMYCIN <=0.25 SENSITIVE  Sensitive     RIFAMPIN <=0.5 SENSITIVE Sensitive     Inducible Clindamycin NEGATIVE Sensitive     * FEW METHICILLIN RESISTANT STAPHYLOCOCCUS AUREUS    Michel Bickers, MD [facility] [place] [facility] [phone] pager   [phone] cell 04/03/2018, 12:18 PM

## 2018-04-04 ENCOUNTER — Inpatient Hospital Stay (HOSPITAL_COMMUNITY): Payer: Medicare Other

## 2018-04-04 LAB — CBC WITH DIFFERENTIAL/PLATELET
Basophils Absolute: 0 10*3/uL (ref 0.0–0.1)
Basophils Relative: 0 %
Eosinophils Absolute: 0.2 10*3/uL (ref 0.0–0.7)
Eosinophils Relative: 2 %
HCT: 33.6 % — ABNORMAL LOW (ref 36.0–46.0)
Hemoglobin: 10.5 g/dL — ABNORMAL LOW (ref 12.0–15.0)
Lymphocytes Relative: 44 %
Lymphs Abs: 5.2 10*3/uL — ABNORMAL HIGH (ref 0.7–4.0)
MCH: 29.6 pg (ref 26.0–34.0)
MCHC: 31.3 g/dL (ref 30.0–36.0)
MCV: 94.6 fL (ref 78.0–100.0)
Monocytes Absolute: 0.7 10*3/uL (ref 0.1–1.0)
Monocytes Relative: 6 %
Neutro Abs: 5.8 10*3/uL (ref 1.7–7.7)
Neutrophils Relative %: 48 %
Platelets: 322 10*3/uL (ref 150–400)
RBC: 3.55 MIL/uL — ABNORMAL LOW (ref 3.87–5.11)
RDW: 14 % (ref 11.5–15.5)
WBC: 11.9 10*3/uL — ABNORMAL HIGH (ref 4.0–10.5)

## 2018-04-04 LAB — COMPREHENSIVE METABOLIC PANEL
ALT: 10 U/L (ref 0–44)
AST: 33 U/L (ref 15–41)
Albumin: 2.7 g/dL — ABNORMAL LOW (ref 3.5–5.0)
Alkaline Phosphatase: 89 U/L (ref 38–126)
Anion gap: 8 (ref 5–15)
BUN: 9 mg/dL (ref 8–23)
CO2: 26 mmol/L (ref 22–32)
Calcium: 8.8 mg/dL — ABNORMAL LOW (ref 8.9–10.3)
Chloride: 104 mmol/L (ref 98–111)
Creatinine, Ser: 0.74 mg/dL (ref 0.44–1.00)
GFR calc Af Amer: >60 mL/min (ref >60)
GFR calc non Af Amer: >60 mL/min (ref >60)
Glucose, Bld: 112 mg/dL — ABNORMAL HIGH (ref 70–99)
Potassium: 3.8 mmol/L (ref 3.5–5.1)
Sodium: 138 mmol/L (ref 135–145)
Total Bilirubin: 0.7 mg/dL (ref 0.3–1.2)
Total Protein: 6 g/dL — ABNORMAL LOW (ref 6.5–8.1)

## 2018-04-04 MED ORDER — SODIUM CHLORIDE 0.9% FLUSH
10.0000 mL | INTRAVENOUS | Status: DC | PRN
  Administered 2018-04-06: 10 mL
  Administered 2018-04-08: 20 mL
  Administered 2018-04-14 (×2): 10 mL
  Filled 2018-04-04 (×4): qty 40

## 2018-04-04 MED ORDER — AMLODIPINE BESYLATE 5 MG PO TABS
5.0000 mg | ORAL_TABLET | Freq: Every day | ORAL | Status: AC
  Administered 2018-04-04: 5 mg via ORAL
  Filled 2018-04-04: qty 1

## 2018-04-04 MED ORDER — AMLODIPINE BESYLATE 10 MG PO TABS
10.0000 mg | ORAL_TABLET | Freq: Every day | ORAL | Status: DC
  Administered 2018-04-05 – 2018-04-14 (×10): 10 mg via ORAL
  Filled 2018-04-04 (×10): qty 1

## 2018-04-04 NOTE — Evaluation (Signed)
Physical Therapy Evaluation Patient Details Name: Ana Franco MRN: [id number] DOB: 1926-04-28 Today's Date: 04/04/2018   History of Present Illness  Ana Franco is a 84 y.o. female who complains of  ORIF in early jjuly in [place]. Wound breakdown over her lateral incision with exposed hardware - to OR for lateral hardware removal and ID.  pt is NWB with CAM boot.  Pt has been in SNF at [facility] since her fall - doing transfers only.  Clinical Impression  Pt hasnt been walking since her fall in mid July.  She is still NWB.  She will benefit from more time in SNF settign to work on strengthening and wound healing for optimal recovery.  Pt remains at transfer only status funcationally.    Follow Up Recommendations SNF    Equipment Recommendations  None recommended by PT    Recommendations for Other Services       Precautions / Restrictions Precautions Precautions: Fall Precaution Comments: MRSA - contact precautions.  Wound Vac intact. Required Braces or Orthoses: (Cam Boot) Restrictions Weight Bearing Restrictions: Yes LLE Weight Bearing: Non weight bearing Other Position/Activity Restrictions: I encouraged pt to always elevate her leg when sitting      Mobility  Bed Mobility Overal bed mobility: Needs Assistance             General bed mobility comments: pt needs assist to get left leg in the bed with heavy CAM boot on.   Pt educated on scooting up in bed - encouraged to bend right leg up more to help.  Transfers Overall transfer level: Needs assistance               General transfer comment: Tried to have pt lift her bottom out of the recliner with arms and right leg - not even close (but woudl have benefited from pillow in chiar for higher seat height.  I used Stedy to help pt get to standing - max cues to put weight on right leg.  pt does sliding board at SNF - this would be best for pt - to guarantee weight off left leg. she VU.  Ambulation/Gait                 Stairs            Wheelchair Mobility    Modified Rankin (Stroke Patients Only)       Balance                                             Pertinent Vitals/Pain Pain Assessment: 0-10 Pain Score: 2  Pain Intervention(s): Limited activity within patient's tolerance;Repositioned;Monitored during session    Home Living Family/patient expects to be discharged to:: Skilled nursing facility                      Prior Function           Comments: Pt has been at [facility] since mid July. She is doing sliding board transfers.  she is afraid of the RW and cant keep the weight off her left leg to stand     Hand Dominance        Extremity/Trunk Assessment   Upper Extremity Assessment Upper Extremity Assessment: Defer to OT evaluation    Lower Extremity Assessment Lower Extremity Assessment: LLE deficits/detail LLE Deficits / Details: Pt has  CAM boot on - did not remove.  pt has hard time with long arc quads but can get 20 degrees from full extension - did 5 reps.  Right leg WFL    Cervical / Trunk Assessment Cervical / Trunk Assessment: Normal  Communication   Communication: No difficulties  Cognition Arousal/Alertness: Awake/alert Behavior During Therapy: WFL for tasks assessed/performed Overall Cognitive Status: Within Functional Limits for tasks assessed                                        General Comments General comments (skin integrity, edema, etc.): pt has good sitting balance    Exercises     Assessment/Plan    PT Assessment Patient needs continued PT services  PT Problem List Decreased strength;Decreased mobility;Decreased activity tolerance;Decreased knowledge of use of DME;Pain;Decreased skin integrity       PT Treatment Interventions DME instruction;Therapeutic activities;Therapeutic exercise;Patient/family education;Functional mobility training;Wheelchair mobility  training    PT Goals (Current goals can be found in the Care Plan section)  Acute Rehab PT Goals Patient Stated Goal: to get back to SNF and heal properly PT Goal Formulation: With patient Time For Goal Achievement: 04/11/18 Potential to Achieve Goals: Good    Frequency Min 3X/week   Barriers to discharge   Pt needs 24/7 assist and therapeutic environment    Co-evaluation               AM-PAC PT "6 Clicks" Daily Activity  Outcome Measure Difficulty turning over in bed (including adjusting bedclothes, sheets and blankets)?: Unable Difficulty moving from lying on back to sitting on the side of the bed? : Unable Difficulty sitting down on and standing up from a chair with arms (e.g., wheelchair, bedside commode, etc,.)?: Unable Help needed moving to and from a bed to chair (including a wheelchair)?: Total Help needed walking in hospital room?: Total Help needed climbing 3-5 steps with a railing? : Total 6 Click Score: 6    End of Session   Activity Tolerance: Patient tolerated treatment well;No increased pain(pt limited by NWB status) Patient left: in bed;with call bell/phone within reach   PT Visit Diagnosis: Difficulty in walking, not elsewhere classified (R26.2);Muscle weakness (generalized) (M62.81);Pain Pain - Right/Left: Left Pain - part of body: Ankle and joints of foot    Time: 1240-1310 PT Time Calculation (min) (ACUTE ONLY): 30 min   Charges:   PT Evaluation $PT Eval Low Complexity: 1 Low PT Treatments $Therapeutic Activity: 8-22 mins        Ranae PalmsElizabeth Lya Holben, PT  Judson RochHildreth, Lorrine Killilea Gardner 82/24/2019, 1:41 PM

## 2018-04-04 NOTE — Evaluation (Addendum)
Occupational Therapy Evaluation Patient Details Name: Ana Franco MRN: [id number] DOB: 1925-12-17 Today's Date: 04/04/2018    History of Present Illness Ana Franco is a 84 y.o. female who complains of  ORIF in early jjuly in [place]. Wound breakdown over her lateral incision with exposed hardware - to OR for lateral hardware removal and ID.  pt is NWB with CAM boot.  Pt has been in SNF at [facility] since her fall - doing transfers only.   Clinical Impression   Prior to initial ankle injury (in July) patient was independent and living alone. Since ORIF L ankle, she has been living at [facility] and requires minA for ADLs and completes sliding board transfers.  She currently requires setup assist for UB ADL, minA for for LB ADLs sitting (lateral leans), and total assistance for toileting.  She was adamant to use stedy for transfers, but required maximal cueing in order to maintain NWB to L LE and educated patient feel she is safer with sliding board.  Educated on precautions, safety, ADL compensatory techniques. Based on performance today, all needs can be met at [facility].  Thank you for this referral!  OT signing OFF.      Follow Up Recommendations  SNF;Supervision/Assistance - 24 hour    Equipment Recommendations  Other (comment)(TBD at next venue of care)    Recommendations for Other Services       Precautions / Restrictions Precautions Precautions: Fall Precaution Comments: MRSA - contact precautions.  Wound Vac intact. Required Braces or Orthoses: Other Brace/Splint Other Brace/Splint: CAM boot to L LE  Restrictions Weight Bearing Restrictions: Yes LLE Weight Bearing: Non weight bearing Other Position/Activity Restrictions: I encouraged pt to always elevate her leg when sitting      Mobility Bed Mobility Overal bed mobility: Needs Assistance Bed Mobility: Supine to Sit     Supine to sit: Min assist     General bed mobility comments: management of L LE    Transfers Overall transfer level: Needs assistance   Transfers: Sit to/from Stand Sit to Stand: Mod assist(pulling on stedy )         General transfer comment: Patient requires modA to ascend from EOB and BSC using stedy with cueing for hand placement, max cueing to maintain NWB to L LE throughout transfers with stedy.  Educated patient on increased safety with sliding board to ensure 100% maintaince of NWB L LE    Balance Overall balance assessment: Needs assistance Sitting-balance support: No upper extremity supported;Feet supported Sitting balance-Leahy Scale: Fair     Standing balance support: Bilateral upper extremity supported;During functional activity Standing balance-Leahy Scale: Zero Standing balance comment: reliant on stedy, B UE support                           ADL either performed or assessed with clinical judgement   ADL Overall ADL's : Needs assistance/impaired     Grooming: Set up;Sitting   Upper Body Bathing: Set up;Sitting   Lower Body Bathing: Minimal assistance;Sitting/lateral leans;Cueing for safety   Upper Body Dressing : Set up;Sitting   Lower Body Dressing: Minimal assistance;Sitting/lateral leans;Cueing for safety   Toilet Transfer: Minimal assistance;Cueing for safety;BSC(stand pivot using stedy) Toilet Transfer Details (indicate cue type and reason): requiring constant verbal cueing to maintain NWB to L LE, adament to use stedy Toileting- Clothing Manipulation and Hygiene: Total assistance;Sit to/from stand Toileting - Clothing Manipulation Details (indicate cue type and reason): using stedy to stand,  total assist with constant max cueing to maintain NWB L LE        General ADL Comments: completed bed moblity, toilet transfers, and recliner transfers      Vision Patient Visual Report: No change from baseline       Perception     Praxis      Pertinent Vitals/Pain Pain Assessment: Faces Pain Score: 2  Faces Pain  Scale: Hurts little more Pain Location:  LLE  Pain Descriptors / Indicators: Discomfort Pain Intervention(s): Limited activity within patient's tolerance;Repositioned     Hand Dominance Right   Extremity/Trunk Assessment Upper Extremity Assessment Upper Extremity Assessment: Overall WFL for tasks assessed   Lower Extremity Assessment Lower Extremity Assessment: Defer to PT evaluation LLE Deficits / Details: Pt has CAM boot on - did not remove.  pt has hard time with long arc quads but can get 20 degrees from full extension - did 5 reps.  Right leg WFL   Cervical / Trunk Assessment Cervical / Trunk Assessment: Normal   Communication Communication Communication: No difficulties   Cognition Arousal/Alertness: Awake/alert Behavior During Therapy: WFL for tasks assessed/performed Overall Cognitive Status: Within Functional Limits for tasks assessed                                     General Comments  pt has good sitting balance    Exercises     Shoulder Instructions      Home Living Family/patient expects to be discharged to:: Skilled nursing facility                                        Prior Functioning/Environment Level of Independence: Independent        Comments: independent prior to injury in July, has been at [facility] since inital surgery and complete slideboard transfers, min assist with ADLs from w/c or BSC.        OT Problem List: Decreased strength;Decreased activity tolerance;Impaired balance (sitting and/or standing);Decreased knowledge of precautions;Decreased safety awareness;Decreased knowledge of use of DME or AE;Pain      OT Treatment/Interventions:      OT Goals(Current goals can be found in the care plan section) Acute Rehab OT Goals Patient Stated Goal: to get back to SNF and heal properly OT Goal Formulation: With patient  OT Frequency:     Barriers to D/C:            Co-evaluation               AM-PAC PT "6 Clicks" Daily Activity     Outcome Measure Help from another person eating meals?: None Help from another person taking care of personal grooming?: A Little Help from another person toileting, which includes using toliet, bedpan, or urinal?: Total Help from another person bathing (including washing, rinsing, drying)?: A Lot Help from another person to put on and taking off regular upper body clothing?: None Help from another person to put on and taking off regular lower body clothing?: Total 6 Click Score: 15   End of Session Equipment Utilized During Treatment: Other (comment)(stedy) Nurse Communication: Mobility status  Activity Tolerance: Patient tolerated treatment well Patient left: in chair;with call bell/phone within reach;with chair alarm set  OT Visit Diagnosis: Other abnormalities of gait and mobility (R26.89);Muscle weakness (generalized) (M62.81);Pain Pain - Right/Left: Left  Pain - part of body: Ankle and joints of foot                Time: 0923-3007 OT Time Calculation (min): 26 min Charges:  OT General Charges $OT Visit: 1 Visit OT Evaluation $OT Eval Moderate Complexity: 1 Mod OT Treatments $Self Care/Home Management : 8-22 mins  Delight Stare, OTR/L  Pager [place] 04/04/2018, 4:05 PM

## 2018-04-04 NOTE — Progress Notes (Signed)
Peripherally Inserted Central Catheter/Midline Placement  The IV Nurse has discussed with the patient and/or persons authorized to consent for the patient, the purpose of this procedure and the potential benefits and risks involved with this procedure.  The benefits include less needle sticks, lab draws from the catheter, and the patient may be discharged home with the catheter. Risks include, but not limited to, infection, bleeding, blood clot (thrombus formation), and puncture of an artery; nerve damage and irregular heartbeat and possibility to perform a PICC exchange if needed/ordered by physician.  Alternatives to this procedure were also discussed.  Bard Power PICC patient education guide, fact sheet on infection prevention and patient information card has been provided to patient /or left at bedside.    PICC/Midline Placement Documentation  PICC Single Lumen 04/04/18 PICC Right Cephalic 39 cm 0 cm (Active)  Indication for Insertion or Continuance of Line Home intravenous therapies (PICC only) 04/04/2018  1:40 PM  Exposed Catheter (cm) 0 cm 04/04/2018  1:40 PM  Site Assessment Clean;Dry;Intact;Ecchymotic 04/04/2018  1:40 PM  Line Status Blood return noted;Flushed;Saline locked 04/04/2018  1:40 PM  Dressing Type Transparent;Securing device 04/04/2018  1:40 PM  Dressing Status Clean;Dry;Intact;Antimicrobial disc in place 04/04/2018  1:40 PM  Dressing Intervention New dressing 04/04/2018  1:40 PM  Dressing Change Due 04/11/18 04/04/2018  1:40 PM       Netta Corriganhomas, Shantanique Hodo L 04/04/2018, 1:49 PM

## 2018-04-04 NOTE — Progress Notes (Signed)
Spoke with Verlon AuLeslie RN re PICC placement possibly later today.  RN to pass on to the RN caring for this pt.

## 2018-04-04 NOTE — Progress Notes (Signed)
PROGRESS NOTE  Ana Franco ZOX:[id number] DOB: [birth year] DOA: 04/02/2018 PCP: Gaspar Garbe, MD  HPI/Brief Narrative  Ana Franco is a 84 y.o. year old female with medical history significant for nonhealing surgical wound status post ORIF of left ankle (02/19/2018) with eventual hardware removal on 03/31/2018, HTN, HLD, A. fib not on anticoagulation, anemia, depression, GERD.[name] Recently here with wound dehiscence from ORIF with exposed hardware and underwent hardware removal on 03/31/2018 by Dr.Timothy Eulah Pont and discharged 04/01/18 on doxycycline for 2 weeks to rehab facility. She was seen at [facility] for follow up on 04/01/18 and found to have  Patient was directly admitted on 8/22 after intraoperative cultures resulted with MRSA resistant to doxycycline and started empirically on vancomycin.  Subjective No acute complaints .  Assessment/Plan: Wound dehiscence/ MRSA infection of left ankle(previous ORIF on 02/2018) status post hardware removal 03/31/2018.  Continue IV vancomycin for total of 6 weeks, end date 05/14/2018 as directed by infectious disease consultants due to concern for early osteomyelitis.  Outpatient antibiotic therapy arranged x-ray on 8/24 shows interval development of left fibular fraction.  Patient remains afebrile with no signs of sepsis.  Ortho and infectious disease following.  PICC line placed on 8/24.    Chronic anemia, stable.  Hemoglobin back at baseline iron deficiency anemia, suspect some acute changes changes related to blood loss from recent procedure, on iron continue.  Paroxysmal atrial fibrillation, rate controlled.  Currently normal sinus rhythm.  Not on anticoagulation due to fall risk.  Hypertension, elevated currently with SBP's in 160s, increased to amlodipine 10 mg.  Continue home metoprolol 100 mg a.m., 25 mg p.m., losartan 100 mg daily and amlodipine 5 mg.  Closely monitor  CKD, stage III, creatinine stable at baseline.  Monitor  BMP.  Avoid nephrotoxic medications  Code Status: Full   Family Communication: no family at bedside   Disposition Plan: IV vanc via PICC, Medically stable to return to SNF, Social work following    Consultants:   Orthopedics, Infectious Disease  Procedures:  none   Antimicrobials: Anti-infectives (From admission, onward)   Start     Dose/Rate Route Frequency Ordered Stop   04/03/18 2100  vancomycin (VANCOCIN) 1,250 mg in sodium chloride 0.9 % 250 mL IVPB  Status:  Discontinued     1,250 mg 166.7 mL/hr over 90 Minutes Intravenous Every 24 hours 04/02/18 1956 04/03/18 0912   04/03/18 2100  vancomycin (VANCOCIN) 1,500 mg in sodium chloride 0.9 % 500 mL IVPB     1,500 mg 250 mL/hr over 120 Minutes Intravenous Every 24 hours 04/03/18 0912     04/02/18 1930  vancomycin (VANCOCIN) 1,250 mg in sodium chloride 0.9 % 250 mL IVPB     1,250 mg 166.7 mL/hr over 90 Minutes Intravenous  Once 04/02/18 1919 04/02/18 2340        Cultures:  none  Telemetry: no  DVT prophylaxis: SCDs   Objective: Vitals:   04/03/18 1404 04/03/18 2028 04/04/18 0549 04/04/18 1420  BP: (!) 149/71 (!) 149/61 (!) 149/64 (!) 160/57  Pulse: 89 87 89 85  Resp: 16 16 17 16   Temp: (!) 97.5 F (36.4 C) 97.7 F (36.5 C) 97.9 F (36.6 C) 98.1 F (36.7 C)  TempSrc: Oral Oral Oral Oral  SpO2: 98%   100%  Weight:      Height:        Intake/Output Summary (Last 24 hours) at 04/04/2018 1654 Last data filed at 04/04/2018 1500 Gross per 24 hour  Intake 880 ml  Output 1000 ml  Net -120 ml   Filed Weights   04/02/18 1831  Weight: 95.8 kg    Exam:  Elderly female lying in bed comfortably, no distress EOMI, anicteric sclera Normal dentition, moist oral mucosa Wound VAC in place on left leg, brace on left leg, No focal neurologic deficits Alert and oriented x3 Normal affect, appropriate mood  Data Reviewed: CBC: Recent Labs  Lab 03/31/18 1000 04/02/18 1938 04/03/18 0512 04/04/18 0911  WBC  16.1* 10.0 9.3 11.9*  NEUTROABS  --  5.0  --  5.8  HGB 9.1* 7.3* 8.3* 10.5*  HCT 29.1* 23.5* 27.1* 33.6*  MCV 93.3 95.1 95.1 94.6  PLT 439* 298 254 322   Basic Metabolic Panel: Recent Labs  Lab 03/31/18 1000 04/02/18 1938 04/03/18 0512 04/04/18 0911  NA 142 138 138 138  K 3.9 3.9 3.9 3.8  CL 107 105 107 104  CO2 24 24 26 26   GLUCOSE 110* 156* 104* 112*  BUN 30* 16 12 9   CREATININE 1.05* 1.01* 0.88 0.74  CALCIUM 9.6 8.8* 8.8* 8.8*  MG  --   --  1.8  --    GFR: Estimated Creatinine Clearance: 54.5 mL/min (by C-G formula based on SCr of 0.74 mg/dL). Liver Function Tests: Recent Labs  Lab 04/04/18 0911  AST 33  ALT 10  ALKPHOS 89  BILITOT 0.7  PROT 6.0*  ALBUMIN 2.7*   No results for input(s): LIPASE, AMYLASE in the last 168 hours. No results for input(s): AMMONIA in the last 168 hours. Coagulation Profile: No results for input(s): INR, PROTIME in the last 168 hours. Cardiac Enzymes: Recent Labs  Lab 04/02/18 2200  TROPONINI 0.04*   BNP (last 3 results) No results for input(s): PROBNP in the last 8760 hours. HbA1C: No results for input(s): HGBA1C in the last 72 hours. CBG: No results for input(s): GLUCAP in the last 168 hours. Lipid Profile: No results for input(s): CHOL, HDL, LDLCALC, TRIG, CHOLHDL, LDLDIRECT in the last 72 hours. Thyroid Function Tests: No results for input(s): TSH, T4TOTAL, FREET4, T3FREE, THYROIDAB in the last 72 hours. Anemia Panel: No results for input(s): VITAMINB12, FOLATE, FERRITIN, TIBC, IRON, RETICCTPCT in the last 72 hours. Urine analysis:    Component Value Date/Time   COLORURINE YELLOW 06/13/2014 0018   APPEARANCEUR CLOUDY (A) 06/13/2014 0018   LABSPEC 1.018 06/13/2014 0018   PHURINE 5.0 06/13/2014 0018   GLUCOSEU NEGATIVE 06/13/2014 0018   HGBUR MODERATE (A) 06/13/2014 0018   BILIRUBINUR NEGATIVE 06/13/2014 0018   KETONESUR NEGATIVE 06/13/2014 0018   PROTEINUR NEGATIVE 06/13/2014 0018   UROBILINOGEN 0.2 06/13/2014 0018    NITRITE POSITIVE (A) 06/13/2014 0018   LEUKOCYTESUR LARGE (A) 06/13/2014 0018   Sepsis Labs: @LABRCNTIP (procalcitonin:4,lacticidven:4)  ) Recent Results (from the past 240 hour(s))  Aerobic Culture (superficial specimen)     Status: None   Collection Time: 03/31/18 12:41 PM  Result Value Ref Range Status   Specimen Description   Final    LEG LEFT Performed at [facility], [address] [place], [place] [address]    Special Requests   Final    NONE Performed at [facility], [address], [place], [place] [address]    Gram Stain   Final    NO WBC SEEN RARE GRAM POSITIVE COCCI IN PAIRS Gram Stain Report Called to,Read Back By and Verified With: T.GREEN AT 1450 ON 03/31/18 BY N.THOMPSON Performed at [facility], [address] [place], [place] [address]    Culture   Final    MODERATE METHICILLIN RESISTANT STAPHYLOCOCCUS AUREUS   Report Status 04/03/2018 FINAL  Final   Organism ID, Bacteria METHICILLIN RESISTANT STAPHYLOCOCCUS AUREUS  Final      Susceptibility   Methicillin resistant staphylococcus aureus - MIC*    CIPROFLOXACIN >=8 RESISTANT Resistant     ERYTHROMYCIN <=0.25 SENSITIVE Sensitive     GENTAMICIN <=0.5 SENSITIVE Sensitive     OXACILLIN >=4 RESISTANT Resistant     TETRACYCLINE >=16 RESISTANT Resistant     VANCOMYCIN 1 SENSITIVE Sensitive     TRIMETH/SULFA >=320 RESISTANT Resistant     CLINDAMYCIN <=0.25 SENSITIVE Sensitive     RIFAMPIN <=0.5 SENSITIVE Sensitive     Inducible Clindamycin NEGATIVE Sensitive     * MODERATE METHICILLIN RESISTANT STAPHYLOCOCCUS AUREUS  Aerobic/Anaerobic Culture (surgical/deep wound)     Status: None (Preliminary result)   Collection Time: 03/31/18  1:14 PM  Result Value Ref Range Status   Specimen Description TISSUE LEFT LEG  Final   Special Requests   Final    NONE Performed at [facility], [address] [place], [place] [address]    Gram  Stain   Final    NO WBC SEEN RARE GRAM POSITIVE COCCI IN PAIRS Gram Stain Report Called to,Read Back By and Verified With: T.GREEN AT 1450 ON 03/31/18 BY N.THOMPSON Performed at [facility], [address], [place], [place] [address]    Culture   Final    FEW METHICILLIN RESISTANT STAPHYLOCOCCUS AUREUS NO ANAEROBES ISOLATED; CULTURE IN PROGRESS FOR 5 DAYS    Report Status PENDING  Incomplete   Organism ID, Bacteria METHICILLIN RESISTANT STAPHYLOCOCCUS AUREUS  Final      Susceptibility   Methicillin resistant staphylococcus aureus - MIC*    CIPROFLOXACIN >=8 RESISTANT Resistant     ERYTHROMYCIN <=0.25 SENSITIVE Sensitive     GENTAMICIN <=0.5 SENSITIVE Sensitive     OXACILLIN >=4 RESISTANT Resistant     TETRACYCLINE >=16 RESISTANT Resistant     VANCOMYCIN <=0.5 SENSITIVE Sensitive     TRIMETH/SULFA >=320 RESISTANT Resistant     CLINDAMYCIN <=0.25 SENSITIVE Sensitive     RIFAMPIN <=0.5 SENSITIVE Sensitive     Inducible Clindamycin NEGATIVE Sensitive     * FEW METHICILLIN RESISTANT STAPHYLOCOCCUS AUREUS      Studies: Dg Ankle Left Port  Result Date: 04/04/2018 CLINICAL DATA:  Left closed ankle fracture with delayed healing. EXAM: PORTABLE LEFT ANKLE - 2 VIEW COMPARISON:  CT scan of March 23, 2018. FINDINGS: Status post surgical fixation of old medial malleolar and posterior malleolar fractures. There appears to have been recent removal of fixation hardware from the distal left fibula. There it appears to be interval development of moderately displaced distal left fibular fracture with lateral dislocation of the talus relative to the distal tibia. IMPRESSION: Postsurgical changes are again noted in distal left tibia. Fixation hardware involving distal left fibula has been removed recently. There is interval development of moderately displaced distal left fibular fracture with lateral dislocation of talus relative to distal tibia. Electronically Signed   By: Lupita Raider, M.D.   On: 04/04/2018 11:02    Scheduled Meds: . amLODipine  5 mg Oral Daily  . aspirin EC  325 mg Oral Daily  . citalopram  20 mg Oral Daily  . ezetimibe-simvastatin  1 tablet Oral QHS  . ferrous sulfate  325 mg Oral BID WC  . losartan  100 mg Oral  Daily  . metoprolol tartrate  100 mg Oral Daily  . metoprolol tartrate  25 mg Oral QPM  . pantoprazole  40 mg Oral Daily    Continuous Infusions: . vancomycin 1,500 mg (04/03/18 2151)     LOS: 2 days     Laverna PeaceShayla D Chrisma Hurlock, MD [facility] Pager [phone]  If 7PM-7AM, please contact night-coverage [email or web address] Password [email or web address] 04/04/2018, 4:54 PM

## 2018-04-05 LAB — AEROBIC/ANAEROBIC CULTURE W GRAM STAIN (SURGICAL/DEEP WOUND): Gram Stain: NONE SEEN

## 2018-04-05 MED ORDER — VANCOMYCIN IV (FOR PTA / DISCHARGE USE ONLY): 1500.0000 mg | INTRAVENOUS | 0 refills | Status: DC

## 2018-04-05 MED ORDER — AMLODIPINE BESYLATE 5 MG PO TABS: 10.0000 mg | ORAL_TABLET | Freq: Every day | ORAL | Status: DC

## 2018-04-05 MED ORDER — HYDROCODONE-ACETAMINOPHEN 5-325 MG PO TABS: 1.0000 | ORAL_TABLET | Freq: Four times a day (QID) | ORAL | 0 refills | Status: DC | PRN

## 2018-04-05 NOTE — Progress Notes (Signed)
ANTIBIOTIC CONSULT NOTE   Pharmacy Consult for Vanocmycin Indication: MRSA wound infection  No Known Allergies  Patient Measurements: Height: 5\' 6"  (167.6 cm) Weight: 211 lb 3.2 oz (95.8 kg) IBW/kg (Calculated) : 59.3 Adjusted Body Weight:    Vital Signs: Temp: 97.8 F (36.6 C) (08/25 0608) Temp Source: Oral (08/25 16100608) BP: 174/71 (08/25 96040608) Pulse Rate: 77 (08/25 0608) Intake/Output from previous day: 08/24 0701 - 08/25 0700 In: 1723.8 [P.O.:700; IV Piggyback:1023.8] Out: 1200 [Urine:1200] Intake/Output from this shift: No intake/output data recorded.  Labs: Recent Labs    04/02/18 1938 04/03/18 0512 04/04/18 0911  WBC 10.0 9.3 11.9*  HGB 7.3* 8.3* 10.5*  PLT 298 254 322  CREATININE 1.01* 0.88 0.74   Estimated Creatinine Clearance: 54.5 mL/min (by C-G formula based on SCr of 0.74 mg/dL). No results for input(s): VANCOTROUGH, VANCOPEAK, VANCORANDOM, GENTTROUGH, GENTPEAK, GENTRANDOM, TOBRATROUGH, TOBRAPEAK, TOBRARND, AMIKACINPEAK, AMIKACINTROU, AMIKACIN in the last 72 hours.   Microbiology:   Medical History: Past Medical History:  Diagnosis Date  . Anemia    vitamin b 12 deficiency anemia  . 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 Chronic venous insufficiency   . Depression   . Difficulty in walking   . Displaced trimalleolar fracture of left lower leg   . Essential (primary) hypertension   . GERD (gastroesophageal reflux disease)   . History of blood transfusion    02/2018   . History of falling   . Hyperlipidemia   . Hyperlipidemia   . Hypertension   . 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 Iron deficiency   . Major depressive disorder   . Muscle weakness (generalized)   . Myocardial infarction (HCC)    [facility]   . Nutritional deficiency, unspecified   .  Obesity   . Other lack of coordination   . Unspecified atrial fibrillation (HCC)   . Unspecified osteoarthritis, unspecified site     Assessment: ID: MRSA in L ankle wound. Discharged to SNF on Ciprofloxacin and Doxycycline - plan for 14 days of therapy. Tissue culture from 8/20 grew MRSA (Resistant to Cipro, Oxacillin, Tetracycline, and Bactrim but S to Clinda).  - Afebrile, WBC 9.3>11.9 up. Treat for presumed osteo.  Vanco 8/22>>  8/20: L leg cx: MRSA  Goal of Therapy:  Vancomycin trough level 15-20 mcg/ml  Plan:  Vancomycin 1500mg  IV q24h (end 10/3). Level prior to discharge. (can be done 8/26 at the earliest) OPAT orders pended  Tonnya Garbett S. Merilynn Finlandobertson, PharmD, BCPS Clinical Staff Pharmacist  Misty Stanleyobertson, Brelan Hannen Stillinger 04/05/2018,10:54 AM

## 2018-04-06 ENCOUNTER — Encounter (HOSPITAL_COMMUNITY): Payer: Self-pay

## 2018-04-06 DIAGNOSIS — L089 Local infection of the skin and subcutaneous tissue, unspecified: Secondary | ICD-10-CM

## 2018-04-06 DIAGNOSIS — T8149XD Infection following a procedure, other surgical site, subsequent encounter: Secondary | ICD-10-CM

## 2018-04-06 DIAGNOSIS — Z95828 Presence of other vascular implants and grafts: Secondary | ICD-10-CM

## 2018-04-06 DIAGNOSIS — T148XXA Other injury of unspecified body region, initial encounter: Secondary | ICD-10-CM

## 2018-04-06 DIAGNOSIS — I96 Gangrene, not elsewhere classified: Secondary | ICD-10-CM

## 2018-04-06 DIAGNOSIS — Z8781 Personal history of (healed) traumatic fracture: Secondary | ICD-10-CM

## 2018-04-06 DIAGNOSIS — Z22322 Carrier or suspected carrier of Methicillin resistant Staphylococcus aureus: Secondary | ICD-10-CM

## 2018-04-06 DIAGNOSIS — Z967 Presence of other bone and tendon implants: Secondary | ICD-10-CM

## 2018-04-06 DIAGNOSIS — X58XXXD Exposure to other specified factors, subsequent encounter: Secondary | ICD-10-CM

## 2018-04-06 DIAGNOSIS — M86272 Subacute osteomyelitis, left ankle and foot: Secondary | ICD-10-CM

## 2018-04-06 LAB — VANCOMYCIN, TROUGH: Vancomycin Tr: 21 ug/mL (ref 15–20)

## 2018-04-06 MED ORDER — VANCOMYCIN IV (FOR PTA / DISCHARGE USE ONLY): 1500.0000 mg | INTRAVENOUS | 0 refills | Status: DC

## 2018-04-06 MED ORDER — VANCOMYCIN HCL 10 G IV SOLR
1250.0000 mg | INTRAVENOUS | Status: AC
  Administered 2018-04-07 – 2018-04-11 (×5): 1250 mg via INTRAVENOUS
  Filled 2018-04-06 (×6): qty 1250

## 2018-04-06 NOTE — Progress Notes (Signed)
Physical Therapy Treatment Patient Details Name: Ana Franco MRN: [id number] DOB: February 12, 1926 Today's Date: 04/06/2018    History of Present Illness Ana Franco is a 84 y.o. female who complains of  ORIF in early jjuly in [place]. Wound breakdown over her lateral incision with exposed hardware - to OR for lateral hardware removal and ID.  pt is NWB with CAM boot.  Pt has been in SNF at [facility] since her fall - doing transfers only.    PT Comments    Pt reports less LLE pain today than yesterday and was able to tolerate seated there ex in chair including LAQ and seated marching. Used stedy to practice sit to stand several times for LE strengthening as well as washing self because pt was soaked of urine at beginning of session. PT will continue to follow.    Follow Up Recommendations  SNF     Equipment Recommendations  None recommended by PT    Recommendations for Other Services       Precautions / Restrictions Precautions Precautions: Fall Precaution Comments: MRSA - contact precautions.  Wound Vac intact. Required Braces or Orthoses: Other Brace/Splint Other Brace/Splint: CAM boot to L LE  Restrictions Weight Bearing Restrictions: Yes LLE Weight Bearing: Non weight bearing Other Position/Activity Restrictions: pt encouraged to elevate LLE in sitting but hurts the back of her leg and buttocks so let her put it down and spoke to NT about not letting her sit more than an hour in chair    Mobility  Bed Mobility Overal bed mobility: Needs Assistance Bed Mobility: Supine to Sit     Supine to sit: Min assist     General bed mobility comments: management of L LE   Transfers Overall transfer level: Needs assistance   Transfers: Sit to/from Stand Sit to Stand: Min assist         General transfer comment: pt able to rise with min A as she pulled on bar of stedy, vc's for LLE NWB. Practiced 3x for strengthening  Ambulation/Gait             General Gait  Details: unable   Stairs             Wheelchair Mobility    Modified Rankin (Stroke Patients Only)       Balance Overall balance assessment: Needs assistance Sitting-balance support: No upper extremity supported;Feet supported Sitting balance-Leahy Scale: Fair     Standing balance support: Bilateral upper extremity supported;During functional activity Standing balance-Leahy Scale: Zero Standing balance comment: reliant on stedy, B UE support                            Cognition Arousal/Alertness: Awake/alert Behavior During Therapy: WFL for tasks assessed/performed Overall Cognitive Status: Within Functional Limits for tasks assessed                                        Exercises General Exercises - Lower Extremity Long Arc Quad: AROM;Both;10 reps;Seated Hip Flexion/Marching: AROM;Both;10 reps;Seated    General Comments General comments (skin integrity, edema, etc.): pt's bed soaked upon PT arrival, worked with NT to change bed and help pt wash self while sitting in stedy      Pertinent Vitals/Pain Pain Assessment: Faces Faces Pain Scale: Hurts little more Pain Location:  LLE  Pain Descriptors / Indicators: Discomfort Pain Intervention(s): Limited  activity within patient's tolerance;Monitored during session    Home Living                      Prior Function            PT Goals (current goals can now be found in the care plan section) Acute Rehab PT Goals Patient Stated Goal: to get back to SNF and heal properly PT Goal Formulation: With patient Time For Goal Achievement: 04/11/18 Potential to Achieve Goals: Good Progress towards PT goals: Progressing toward goals    Frequency    Min 3X/week      PT Plan Current plan remains appropriate    Co-evaluation              AM-PAC PT "6 Clicks" Daily Activity  Outcome Measure  Difficulty turning over in bed (including adjusting bedclothes, sheets and  blankets)?: Unable Difficulty moving from lying on back to sitting on the side of the bed? : Unable Difficulty sitting down on and standing up from a chair with arms (e.g., wheelchair, bedside commode, etc,.)?: Unable Help needed moving to and from a bed to chair (including a wheelchair)?: A Lot Help needed walking in hospital room?: Total Help needed climbing 3-5 steps with a railing? : Total 6 Click Score: 7    End of Session   Activity Tolerance: Patient tolerated treatment well;No increased pain Patient left: with call bell/phone within reach;in chair Nurse Communication: Mobility status PT Visit Diagnosis: Difficulty in walking, not elsewhere classified (R26.2);Muscle weakness (generalized) (M62.81);Pain Pain - Right/Left: Left Pain - part of body: Ankle and joints of foot     Time: 1213-1238 PT Time Calculation (min) (ACUTE ONLY): 25 min  Charges:  $Therapeutic Exercise: 8-22 mins $Therapeutic Activity: 8-22 mins                     Lyanne CoVictoria [name], PT  Acute Rehab Services  [phone]    SolomonsVictoria L Cornella Franco 04/06/2018, 1:27 PM

## 2018-04-06 NOTE — Progress Notes (Signed)
Pt's family upset that the orthopedic doctor spoke to the pt in their absence and are asking this nurse to communicate no further conversations with the patient without the family present

## 2018-04-06 NOTE — Consult Note (Signed)
ORTHOPAEDIC CONSULTATION  REQUESTING PHYSICIAN: Desiree Hane, MD  Chief Complaint: Osteomyelitis and necrotic wound medial malleolus status post excision of the fibular plate.  HPI: Ana Franco is a 84 y.o. female who presents with necrotic medial malleolar ulcer status post bimalleolar open reduction internal fixation with a posterior plate as well as medial malleolar screw and a fibular plate.  Patient is status post excision of fibular plate due to wound breakdown.  Past Medical History:  Diagnosis Date  . Anemia    vitamin b 12 deficiency anemia  . 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 Chronic venous insufficiency   . Depression   . Difficulty in walking   . Displaced trimalleolar fracture of left lower leg   . Essential (primary) hypertension   . GERD (gastroesophageal reflux disease)   . History of blood transfusion    02/2018   . History of falling   . Hyperlipidemia   . Hyperlipidemia   . Hypertension   . 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 Iron deficiency   . Major depressive disorder   . Muscle weakness (generalized)   . Myocardial infarction ([place])    nonstemi mi - 02/19/2018 at [facility] after ankle fracture   . Nutritional deficiency, unspecified   . Obesity   . Other lack of coordination   . Unspecified atrial fibrillation ([place])   . Unspecified osteoarthritis, unspecified site    Past Surgical History:  Procedure Laterality Date  . APPENDECTOMY    . APPLICATION OF WOUND VAC Left 03/31/2018   Procedure: APPLICATION OF WOUND VAC;  Surgeon: Renette Butters, MD;  Location: [facility];  Service: Orthopedics;  Laterality: Left;  . HARDWARE REMOVAL Left 03/31/2018   Procedure: HARDWARE REMOVAL;  Surgeon: Renette Butters, MD;  Location: [facility];  Service: Orthopedics;  Laterality: Left;  . IRRIGATION AND  DEBRIDEMENT FOOT Left 03/31/2018   Procedure: IRRIGATION AND DEBRIDEMENT OF LEFT ANKLE;  Surgeon: Renette Butters, MD;  Location: [facility];  Service: Orthopedics;  Laterality: Left;  . ORIF ANKLE FRACTURE Right 06/13/2014   Procedure: OPEN REDUCTION INTERNAL FIXATION (ORIF) BIMALLEOLAR ANKLE FRACTURE;  Surgeon: Ninetta Lights, MD;  Location: [place];  Service: Orthopedics;  Laterality: Right;   Social History   Socioeconomic History  . Marital status: Widowed    Spouse name: Not on file  . Number of children: Not on file  . Years of education: Not on file  . Highest education level: Not on file  Occupational History  . Not on file  Social Needs  . Financial resource strain: Not on file  . Food insecurity:    Worry: Not on file    Inability: Not on file  . Transportation needs:    Medical: Not on file    Non-medical: Not on file  Tobacco Use  . Smoking status: Never Smoker  . Smokeless tobacco: Never Used  Substance and Sexual Activity  . Alcohol use: No  . Drug use: No  . Sexual activity: Not on file  Lifestyle  . Physical activity:    Days per week: Not on file    Minutes per session: Not on file  . Stress: Not on file  Relationships  . Social connections:    Talks on phone: Not on file    Gets together: Not on file    Attends religious  service: Not on file    Active member of club or organization: Not on file    Attends meetings of clubs or organizations: Not on file    Relationship status: Not on file  Other Topics Concern  . Not on file  Social History Narrative  . Not on file   History reviewed. No pertinent family history. - negative except otherwise stated in the family history section No Known Allergies Prior to Admission medications   Medication Sig Start Date End Date Taking? Authorizing Provider  acetaminophen (TYLENOL) 325 MG tablet Take 325 mg by mouth every 6 (six) hours as needed for headache (pain).   Yes [provider]  acetaminophen  (TYLENOL) 500 MG tablet Take 500 mg by mouth at bedtime.    Yes [provider]  aspirin EC 325 MG tablet Take 1 tablet (325 mg total) by mouth daily. 06/13/14  Yes Aundra Dubin, PA-C  beta carotene w/minerals (OCUVITE) tablet Take 1 tablet by mouth daily.    Yes [provider]  calcium-vitamin D (OSCAL WITH D) 500-200 MG-UNIT tablet Take 1 tablet by mouth daily.   Yes [provider]  ciprofloxacin (CIPRO) 500 MG tablet Take 500 mg by mouth 2 (two) times daily. For 14 days - Start date 03/23/2018   Yes [provider]  citalopram (CELEXA) 20 MG tablet Take 20 mg by mouth daily.   Yes [provider]  doxycycline (VIBRAMYCIN) 50 MG capsule Take 2 capsules (100 mg total) by mouth 2 (two) times daily for 14 days. 03/31/18 04/14/18 Yes Martensen, Charna Elizabeth III, PA-C  esomeprazole (NEXIUM) 40 MG capsule Take 40 mg by mouth daily.    Yes [provider]  ezetimibe-simvastatin (VYTORIN) 10-40 MG per tablet Take 1 tablet by mouth at bedtime.   Yes [provider]  ferrous sulfate 325 (65 FE) MG tablet Take 325 mg by mouth 2 (two) times daily with a meal.   Yes [provider]  losartan (COZAAR) 100 MG tablet Take 100 mg by mouth daily.   Yes [provider]  metoprolol tartrate (LOPRESSOR) 50 MG tablet Take 50 mg by mouth 2 (two) times daily.    Yes [provider]  Multiple Vitamins-Minerals (DECUBI-VITE PO) Take 1 capsule by mouth daily.    Yes [provider]  nitroGLYCERIN (NITROSTAT) 0.4 MG SL tablet Place 0.4 mg under the tongue every 5 (five) minutes as needed for chest pain.   Yes [provider]  ondansetron (ZOFRAN) 4 MG tablet Take 1 tablet (4 mg total) by mouth every 8 (eight) hours as needed for nausea or vomiting. 03/31/18  Yes Martensen, Charna Elizabeth III, PA-C  vitamin B-12 (CYANOCOBALAMIN) 1000 MCG tablet Take 1,000 mcg by mouth daily.   Yes [provider]  amLODipine  (NORVASC) 5 MG tablet Take 2 tablets (10 mg total) by mouth daily. 04/05/18   Desiree Hane, MD  HYDROcodone-acetaminophen (NORCO) 5-325 MG tablet Take 1 tablet by mouth every 6 (six) hours as needed for up to 5 days for severe pain. 04/05/18 04/10/18  Oretha Milch D, MD  vancomycin IVPB Inject 1,500 mg into the vein daily. Indication:  MRSA L ankle wound Last Day of Therapy:  05/14/2018 Labs - Sunday/Monday:  CBC/D, BMP, and vancomycin trough. Labs - Thursday:  BMP and vancomycin trough (15-20) Labs - Every other week:  ESR and CRP 04/06/18 05/14/18  Oretha Milch D, MD   No results found. - pertinent xrays, CT, MRI studies were  reviewed and independently interpreted  Positive ROS: All other systems have been reviewed and were otherwise negative with the exception of those mentioned in the HPI and as above.  Physical Exam: General: Alert, no acute distress Psychiatric: Patient is competent for consent with normal mood and affect Lymphatic: No axillary or cervical lymphadenopathy Cardiovascular: No pedal edema Respiratory: No cyanosis, no use of accessory musculature GI: No organomegaly, abdomen is soft and non-tender    Images:  '@ENCIMAGES'$ @  Labs:  Lab Results  Component Value Date   REPTSTATUS 04/05/2018 FINAL 03/31/2018   GRAMSTAIN  03/31/2018    NO WBC SEEN RARE GRAM POSITIVE COCCI IN PAIRS Gram Stain Report Called to,Read Back By and Verified With: T.GREEN AT 4132 ON 03/31/18 BY N.THOMPSON Performed at [facility], [place] [address] [place], [place] [address]    CULT  03/31/2018    FEW METHICILLIN RESISTANT STAPHYLOCOCCUS AUREUS NO ANAEROBES ISOLATED Performed at [facility], [place] [address] [place], [place] [address]    LABORGA METHICILLIN RESISTANT STAPHYLOCOCCUS AUREUS 03/31/2018    Lab Results  Component Value Date   ALBUMIN 2.7 (L) 04/04/2018   ALBUMIN 3.4 (L) 06/12/2014    Neurologic: Patient does not have protective sensation  bilateral lower extremities.   MUSCULOSKELETAL:   Skin: Examination patient has a necrotic wound over the medial malleolus.  The surgical incision over the medial malleolus is a hockey stick incision which runs from the posterior aspect of the medial malleolus distal to the medial malleolus and extends anteriorly.  There is a 3 cm in diameter necrotic wound over the medial malleolus with exposed hardware.  The wound VAC is intact for the lateral malleolus.  Patient's radiographs shows displacement of the tibiotalar joint with displacement of the medial malleolus as well as the lateral malleolus.  Patient does not have a palpable pulse her skin is thin and atrophic.    Patient has protein caloric malnutrition.  Assessment: Assessment: Osteomyelitis left ankle status post excision of the fibular plate with gangrenous necrotic wound over the medial malleolus with exposed hardware.  With protein caloric malnutrition.  Plan: Plan: Discussed with the patient the only way to treat the gangrene and osteomyelitis of the medial malleolus with the associated osteomyelitis of the fibula would be to proceed with a transtibial amputation.  Patient does not have the circulation at the level of the ankle to the heel the gangrenous necrotic wound over the medial malleolus.  Patient states that she is not ready to consider amputation surgery at this time.  Feel that it would be safe for discharge to skilled nursing with IV vancomycin with dressing changes over the medial malleolus.  Once the wound VAC has expired dry dressing changes for the medial and lateral malleolar wounds.  I anticipate patient will have progressive gangrenous changes around the ankle with progressive breakdown of the wounds and exposed bone with further subluxation and dislocation with weightbearing on the left lower extremity.  I will follow-up with the patient in 2 weeks.  Patient wishes to be discharged to skilled nursing at this  time.  Thank you for the consult and the opportunity to see Ms. Helder  Meridee Score, MD [facility] [phone] 5:32 PM

## 2018-04-06 NOTE — Progress Notes (Signed)
Dr Sharol Given met with pt this evening and discussed a left foot amputation which the pt declined. Pt's son notified of plans for discharge tomorrow as anticipated by Dr Percell Miller

## 2018-04-06 NOTE — Progress Notes (Signed)
CRITICAL VALUE ALERT  Critical Value:  Vancomycin TR   Date & Time Notied: 04/06/2018 20:56  Provider Notified: Pharmacy   Orders Received/Actions taken: Pharmacy will lower the dose of the medication.

## 2018-04-06 NOTE — Progress Notes (Signed)
ANTIBIOTIC CONSULT NOTE   Pharmacy Consult for Vanocmycin Indication: MRSA wound infection  No Known Allergies  Patient Measurements: Height: 5\' 6"  (167.6 cm) Weight: 211 lb 3.2 oz (95.8 kg) IBW/kg (Calculated) : 59.3 Adjusted Body Weight:    Vital Signs: Temp: 98.4 F (36.9 C) (08/26 1700) Temp Source: Oral (08/26 1700) BP: 148/66 (08/26 1700) Pulse Rate: 65 (08/26 1700) Intake/Output from previous day: 08/25 0701 - 08/26 0700 In: 1220 [P.O.:720; IV Piggyback:500] Out: 800 [Urine:800] Intake/Output from this shift: Total I/O In: 10 [I.V.:10] Out: -   Labs: Recent Labs    04/04/18 0911  WBC 11.9*  HGB 10.5*  PLT 322  CREATININE 0.74   Estimated Creatinine Clearance: 54.5 mL/min (by C-G formula based on SCr of 0.74 mg/dL). Recent Labs    04/06/18 1952  VANCOTROUGH 21*     Microbiology:   Medical History: Past Medical History:  Diagnosis Date  . Anemia    vitamin b 12 deficiency anemia  . 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 Chronic venous insufficiency   . Depression   . Difficulty in walking   . Displaced trimalleolar fracture of left lower leg   . Essential (primary) hypertension   . GERD (gastroesophageal reflux disease)   . History of blood transfusion    02/2018   . History of falling   . Hyperlipidemia   . Hyperlipidemia   . Hypertension   . 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 Iron deficiency   . Major depressive disorder   . Muscle weakness (generalized)   . Myocardial infarction ([facility])    nonstemi mi [facility]   . Nutritional deficiency, unspecified   . Obesity   . Other lack of coordination   . Unspecified atrial fibrillation ([facility])   . Unspecified osteoarthritis, unspecified site     Assessment: ID: MRSA in L ankle wound. Discharged to SNF on  Ciprofloxacin and Doxycycline - plan for 14 days of therapy. Tissue culture from 8/20 grew MRSA (Resistant to Cipro, Oxacillin, Tetracycline, and Bactrim but S to [place]).  - Afebrile, WBC 9.3>11.9 up. Treat for presumed osteo.  Vancomycin trough = 21 this evening. Slightly supratherapeutic. Renal function stable.   Vanco 8/22>>  8/20: L leg cx: MRSA  Goal of Therapy:  Vancomycin trough level 15-20 mcg/ml  Plan:  Change Vancomycin dose to 1250mg  IV q24h (end 10/3). Next dose 0600 tomorrow AM.  If goes home can continue this dose and give in AM.  Bayard HuggerMei Edna Grover, PharmD, BCPS, BCPPS Clinical Pharmacist  Pager: [phone]   04/06/2018,9:05 PM

## 2018-04-06 NOTE — Progress Notes (Signed)
Subjective:   Objective: Vital signs in last 24 hours: Temp:  [97.7 F (36.5 C)-98.4 F (36.9 C)] 98.4 F (36.9 C) (08/26 0422) Pulse Rate:  [53-88] 75 (08/26 0422) Resp:  [16-18] 16 (08/26 0422) BP: (135-161)/(54-72) 161/72 (08/26 0422) SpO2:  [95 %-97 %] 95 % (08/26 0422)  Intake/Output from previous day: 08/25 0701 - 08/26 0700 In: 1220 [P.O.:720; IV Piggyback:500] Out: 800 [Urine:800] Intake/Output this shift: No intake/output data recorded.  Recent Labs    04/04/18 0911  HGB 10.5*   Recent Labs    04/04/18 0911  WBC 11.9*  RBC 3.55*  HCT 33.6*  PLT 322   Recent Labs    04/04/18 0911  NA 138  K 3.8  CL 104  CO2 26  BUN 9  CREATININE 0.74  GLUCOSE 112*  CALCIUM 8.8*   No results for input(s): LABPT, INR in the last 72 hours.  Patient's fracture displaced after hardware removal for infection.     Assessment Principal Problem:   Wound infection Active Problems:   Essential hypertension, benign   Dyslipidemia   Closed left ankle fracture, with delayed healing, subsequent encounter   MRSA (methicillin resistant staph aureus) culture positive   Acute blood loss anemia   S/P ORIF (open reduction internal fixation) fracture   Plan: I discussed this displacement of the fracture with Dr Eulah PontMurphy.  He will discuss the case with Dr Lajoyce Cornersuda.  Patient will likely need more orthopedic intervention prior to return to SNF   Shakeda Pearse J Kobi Mario 04/06/2018, 7:25 AM

## 2018-04-06 NOTE — Progress Notes (Signed)
Patient ID: Ana Franco, female   DOB: 03-10-1926, 84 y.o.   MRN: [id number]         [facility] for Infectious Disease  Date of Admission:  04/02/2018           Day 4 vancomycin ASSESSMENT: She has nonunion of fractures and MRSA infection of her left ankle.  6 weeks of IV vancomycin.  PLAN: 1. Continue vancomycin 2. I will sign off now and arrange follow-up in my clinic  Diagnosis: Postoperative left ankle infection  Culture Result: MRSA  No Known Allergies  OPAT Orders Discharge antibiotics: Per pharmacy protocol vancomycin Aim for Vancomycin trough 15-20 (unless otherwise indicated) Duration: 6 weeks End Date: 05/14/2018  [facility] Care Per Protocol:  Labs weekly while on IV antibiotics: _x_ CBC with differential _x_ BMP __ CMP _x_ CRP _x_ ESR _x_ Vancomycin trough  _x_ Please pull PIC at completion of IV antibiotics __ Please leave PIC in place until doctor has seen patient or been notified  Fax weekly labs to [phone]  Clinic Follow Up Appt: 05/14/2018   Principal Problem:   Wound infection Active Problems:   MRSA (methicillin resistant staph aureus) culture positive   Closed left ankle fracture, with delayed healing, subsequent encounter   S/P ORIF (open reduction internal fixation) fracture   Essential hypertension, benign   Dyslipidemia   Acute blood loss anemia   Scheduled Meds: . amLODipine  10 mg Oral Daily  . aspirin EC  325 mg Oral Daily  . citalopram  20 mg Oral Daily  . ezetimibe-simvastatin  1 tablet Oral QHS  . ferrous sulfate  325 mg Oral BID WC  . losartan  100 mg Oral Daily  . metoprolol tartrate  100 mg Oral Daily  . metoprolol tartrate  25 mg Oral QPM  . pantoprazole  40 mg Oral Daily   Continuous Infusions: . vancomycin 1,500 mg (04/05/18 2056)   PRN Meds:.acetaminophen **OR** acetaminophen, HYDROcodone-acetaminophen, ondansetron **OR** ondansetron (ZOFRAN) IV, sodium chloride flush   SUBJECTIVE: She  was having moderately severe left ankle pain this morning but it is doing better this afternoon.  Review of Systems: Review of Systems  Constitutional: Negative for chills, diaphoresis and fever.  Gastrointestinal: Negative for abdominal pain, diarrhea, nausea and vomiting.  Musculoskeletal: Positive for joint pain.    No Known Allergies  OBJECTIVE: Vitals:   04/05/18 0608 04/05/18 1325 04/05/18 2102 04/06/18 0422  BP: (!) 174/71 (!) 150/54 (!) 135/59 (!) 161/72  Pulse: 77 88 (!) 53 75  Resp: '16 18 16 16  '$ Temp: 97.8 F (36.6 C) 97.7 F (36.5 C) 97.9 F (36.6 C) 98.4 F (36.9 C)  TempSrc: Oral Oral Oral Oral  SpO2: 97% 97% 97% 95%  Weight:      Height:       Body mass index is 34.09 kg/m.  Physical Exam  Constitutional: She is oriented to person, place, and time.  She is sitting up in a chair visiting with her son, Richardson Landry.  Musculoskeletal:  She has a Velcro boot on her left lower leg.  Neurological: She is alert and oriented to person, place, and time.  Skin: No rash noted.  She has a new right arm PICC.  Psychiatric: She has a normal mood and affect.    Lab Results Lab Results  Component Value Date   WBC 11.9 (H) 04/04/2018   HGB 10.5 (L) 04/04/2018   HCT 33.6 (L) 04/04/2018   MCV 94.6 04/04/2018   PLT 322 04/04/2018  Lab Results  Component Value Date   CREATININE 0.74 04/04/2018   BUN 9 04/04/2018   NA 138 04/04/2018   K 3.8 04/04/2018   CL 104 04/04/2018   CO2 26 04/04/2018    Lab Results  Component Value Date   ALT 10 04/04/2018   AST 33 04/04/2018   ALKPHOS 89 04/04/2018   BILITOT 0.7 04/04/2018     Microbiology: Recent Results (from the past 240 hour(s))  Aerobic Culture (superficial specimen)     Status: None   Collection Time: 03/31/18 12:41 PM  Result Value Ref Range Status   Specimen Description   Final    LEG LEFT Performed at [facility], [place] [address], [place], [place] [address]    Special Requests   Final     NONE Performed at [facility], [place] [address], [place], [place] [address]    Gram Stain   Final    NO WBC SEEN RARE GRAM POSITIVE COCCI IN PAIRS Gram Stain Report Called to,Read Back By and Verified With: T.GREEN AT 8841 ON 03/31/18 BY N.THOMPSON Performed at [facility], [place] [address] [place], [place] [address]    Culture   Final    MODERATE METHICILLIN RESISTANT STAPHYLOCOCCUS AUREUS   Report Status 04/03/2018 FINAL  Final   Organism ID, Bacteria METHICILLIN RESISTANT STAPHYLOCOCCUS AUREUS  Final      Susceptibility   Methicillin resistant staphylococcus aureus - MIC*    CIPROFLOXACIN >=8 RESISTANT Resistant     ERYTHROMYCIN <=0.25 SENSITIVE Sensitive     GENTAMICIN <=0.5 SENSITIVE Sensitive     OXACILLIN >=4 RESISTANT Resistant     TETRACYCLINE >=16 RESISTANT Resistant     VANCOMYCIN 1 SENSITIVE Sensitive     TRIMETH/SULFA >=320 RESISTANT Resistant     CLINDAMYCIN <=0.25 SENSITIVE Sensitive     RIFAMPIN <=0.5 SENSITIVE Sensitive     Inducible Clindamycin NEGATIVE Sensitive     * MODERATE METHICILLIN RESISTANT STAPHYLOCOCCUS AUREUS  Aerobic/Anaerobic Culture (surgical/deep wound)     Status: None   Collection Time: 03/31/18  1:14 PM  Result Value Ref Range Status   Specimen Description TISSUE LEFT LEG  Final   Special Requests   Final    NONE Performed at [place] [address], [place], [place] [address]    Gram Stain   Final    NO WBC SEEN RARE GRAM POSITIVE COCCI IN PAIRS Gram Stain Report Called to,Read Back By and Verified With: T.GREEN AT 0932 ON 03/31/18 BY N.THOMPSON Performed at [facility], [place] [address] [place], [place] [address]    Culture   Final    FEW METHICILLIN RESISTANT STAPHYLOCOCCUS AUREUS NO ANAEROBES ISOLATED Performed at [facility], [place] [address] [place], [place] [address]    Report Status 04/05/2018 FINAL  Final   Organism ID, Bacteria  METHICILLIN RESISTANT STAPHYLOCOCCUS AUREUS  Final      Susceptibility   Methicillin resistant staphylococcus aureus - MIC*    CIPROFLOXACIN >=8 RESISTANT Resistant     ERYTHROMYCIN <=0.25 SENSITIVE Sensitive     GENTAMICIN <=0.5 SENSITIVE Sensitive     OXACILLIN >=4 RESISTANT Resistant     TETRACYCLINE >=16 RESISTANT Resistant     VANCOMYCIN <=0.5 SENSITIVE Sensitive     TRIMETH/SULFA >=320 RESISTANT Resistant     CLINDAMYCIN <=0.25 SENSITIVE Sensitive     RIFAMPIN <=0.5 SENSITIVE Sensitive     Inducible Clindamycin NEGATIVE Sensitive     * FEW METHICILLIN RESISTANT  STAPHYLOCOCCUS AUREUS    Michel Bickers, [place] [facility] [place] [facility] [phone] pager   [phone] cell 04/06/2018, 3:32 PM

## 2018-04-06 NOTE — H&P (View-Only) (Signed)
ORTHOPAEDIC CONSULTATION  REQUESTING PHYSICIAN: Desiree Hane, MD  Chief Complaint: Osteomyelitis and necrotic wound medial malleolus status post excision of the fibular plate.  HPI: Ana Franco is a 84 y.o. female who presents with necrotic medial malleolar ulcer status post bimalleolar open reduction internal fixation with a posterior plate as well as medial malleolar screw and a fibular plate.  Patient is status post excision of fibular plate due to wound breakdown.  Past Medical History:  Diagnosis Date  . Anemia    vitamin b 12 deficiency anemia  . 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 Chronic venous insufficiency   . Depression   . Difficulty in walking   . Displaced trimalleolar fracture of left lower leg   . Essential (primary) hypertension   . GERD (gastroesophageal reflux disease)   . History of blood transfusion    02/2018   . History of falling   . Hyperlipidemia   . Hyperlipidemia   . Hypertension   . 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 Iron deficiency   . Major depressive disorder   . Muscle weakness (generalized)   . Myocardial infarction ([place])    nonstemi mi - 02/19/2018 at [facility] after ankle fracture   . Nutritional deficiency, unspecified   . Obesity   . Other lack of coordination   . Unspecified atrial fibrillation ([place])   . Unspecified osteoarthritis, unspecified site    Past Surgical History:  Procedure Laterality Date  . APPENDECTOMY    . APPLICATION OF WOUND VAC Left 03/31/2018   Procedure: APPLICATION OF WOUND VAC;  Surgeon: Renette Butters, MD;  Location: [facility];  Service: Orthopedics;  Laterality: Left;  . HARDWARE REMOVAL Left 03/31/2018   Procedure: HARDWARE REMOVAL;  Surgeon: Renette Butters, MD;  Location: [facility];  Service: Orthopedics;  Laterality: Left;  . IRRIGATION AND  DEBRIDEMENT FOOT Left 03/31/2018   Procedure: IRRIGATION AND DEBRIDEMENT OF LEFT ANKLE;  Surgeon: Renette Butters, MD;  Location: [facility];  Service: Orthopedics;  Laterality: Left;  . ORIF ANKLE FRACTURE Right 06/13/2014   Procedure: OPEN REDUCTION INTERNAL FIXATION (ORIF) BIMALLEOLAR ANKLE FRACTURE;  Surgeon: Ninetta Lights, MD;  Location: [facility];  Service: Orthopedics;  Laterality: Right;   Social History   Socioeconomic History  . Marital status: Widowed    Spouse name: Not on file  . Number of children: Not on file  . Years of education: Not on file  . Highest education level: Not on file  Occupational History  . Not on file  Social Needs  . Financial resource strain: Not on file  . Food insecurity:    Worry: Not on file    Inability: Not on file  . Transportation needs:    Medical: Not on file    Non-medical: Not on file  Tobacco Use  . Smoking status: Never Smoker  . Smokeless tobacco: Never Used  Substance and Sexual Activity  . Alcohol use: No  . Drug use: No  . Sexual activity: Not on file  Lifestyle  . Physical activity:    Days per week: Not on file    Minutes per session: Not on file  . Stress: Not on file  Relationships  . Social connections:    Talks on phone: Not on file    Gets together: Not on file    Attends religious  service: Not on file    Active member of club or organization: Not on file    Attends meetings of clubs or organizations: Not on file    Relationship status: Not on file  Other Topics Concern  . Not on file  Social History Narrative  . Not on file   History reviewed. No pertinent family history. - negative except otherwise stated in the family history section No Known Allergies Prior to Admission medications   Medication Sig Start Date End Date Taking? Authorizing Provider  acetaminophen (TYLENOL) 325 MG tablet Take 325 mg by mouth every 6 (six) hours as needed for headache (pain).   Yes [provider]  acetaminophen  (TYLENOL) 500 MG tablet Take 500 mg by mouth at bedtime.    Yes [provider]  aspirin EC 325 MG tablet Take 1 tablet (325 mg total) by mouth daily. 06/13/14  Yes Aundra Dubin, PA-C  beta carotene w/minerals (OCUVITE) tablet Take 1 tablet by mouth daily.    Yes [provider]  calcium-vitamin D (OSCAL WITH D) 500-200 MG-UNIT tablet Take 1 tablet by mouth daily.   Yes [provider]  ciprofloxacin (CIPRO) 500 MG tablet Take 500 mg by mouth 2 (two) times daily. For 14 days - Start date 03/23/2018   Yes [provider]  citalopram (CELEXA) 20 MG tablet Take 20 mg by mouth daily.   Yes [provider]  doxycycline (VIBRAMYCIN) 50 MG capsule Take 2 capsules (100 mg total) by mouth 2 (two) times daily for 14 days. 03/31/18 04/14/18 Yes Martensen, Charna Elizabeth III, PA-C  esomeprazole (NEXIUM) 40 MG capsule Take 40 mg by mouth daily.    Yes [provider]  ezetimibe-simvastatin (VYTORIN) 10-40 MG per tablet Take 1 tablet by mouth at bedtime.   Yes [provider]  ferrous sulfate 325 (65 FE) MG tablet Take 325 mg by mouth 2 (two) times daily with a meal.   Yes [provider]  losartan (COZAAR) 100 MG tablet Take 100 mg by mouth daily.   Yes [provider]  metoprolol tartrate (LOPRESSOR) 50 MG tablet Take 50 mg by mouth 2 (two) times daily.    Yes [provider]  Multiple Vitamins-Minerals (DECUBI-VITE PO) Take 1 capsule by mouth daily.    Yes [provider]  nitroGLYCERIN (NITROSTAT) 0.4 MG SL tablet Place 0.4 mg under the tongue every 5 (five) minutes as needed for chest pain.   Yes [provider]  ondansetron (ZOFRAN) 4 MG tablet Take 1 tablet (4 mg total) by mouth every 8 (eight) hours as needed for nausea or vomiting. 03/31/18  Yes Martensen, Charna Elizabeth III, PA-C  vitamin B-12 (CYANOCOBALAMIN) 1000 MCG tablet Take 1,000 mcg by mouth daily.   Yes [provider]  amLODipine  (NORVASC) 5 MG tablet Take 2 tablets (10 mg total) by mouth daily. 04/05/18   Desiree Hane, MD  HYDROcodone-acetaminophen (NORCO) 5-325 MG tablet Take 1 tablet by mouth every 6 (six) hours as needed for up to 5 days for severe pain. 04/05/18 04/10/18  Oretha Milch D, MD  vancomycin IVPB Inject 1,500 mg into the vein daily. Indication:  MRSA L ankle wound Last Day of Therapy:  05/14/2018 Labs - Sunday/Monday:  CBC/D, BMP, and vancomycin trough. Labs - Thursday:  BMP and vancomycin trough (15-20) Labs - Every other week:  ESR and CRP 04/06/18 05/14/18  Oretha Milch D, MD   No results found. - pertinent xrays, CT, MRI studies were  reviewed and independently interpreted  Positive ROS: All other systems have been reviewed and were otherwise negative with the exception of those mentioned in the HPI and as above.  Physical Exam: General: Alert, no acute distress Psychiatric: Patient is competent for consent with normal mood and affect Lymphatic: No axillary or cervical lymphadenopathy Cardiovascular: No pedal edema Respiratory: No cyanosis, no use of accessory musculature GI: No organomegaly, abdomen is soft and non-tender    Images:  '@ENCIMAGES'$ @  Labs:  Lab Results  Component Value Date   REPTSTATUS 04/05/2018 FINAL 03/31/2018   GRAMSTAIN  03/31/2018    NO WBC SEEN RARE GRAM POSITIVE COCCI IN PAIRS Gram Stain Report Called to,Read Back By and Verified With: T.GREEN AT 3818 ON 03/31/18 BY N.THOMPSON Performed at [facility], [place] [address], [place], [place] [address]    CULT  03/31/2018    FEW METHICILLIN RESISTANT STAPHYLOCOCCUS AUREUS NO ANAEROBES ISOLATED Performed at [facility], [place] [address], [place], [place] [address]    LABORGA METHICILLIN RESISTANT STAPHYLOCOCCUS AUREUS 03/31/2018    Lab Results  Component Value Date   ALBUMIN 2.7 (L) 04/04/2018   ALBUMIN 3.4 (L) 06/12/2014    Neurologic: Patient does not have protective sensation  bilateral lower extremities.   MUSCULOSKELETAL:   Skin: Examination patient has a necrotic wound over the medial malleolus.  The surgical incision over the medial malleolus is a hockey stick incision which runs from the posterior aspect of the medial malleolus distal to the medial malleolus and extends anteriorly.  There is a 3 cm in diameter necrotic wound over the medial malleolus with exposed hardware.  The wound VAC is intact for the lateral malleolus.  Patient's radiographs shows displacement of the tibiotalar joint with displacement of the medial malleolus as well as the lateral malleolus.  Patient does not have a palpable pulse her skin is thin and atrophic.    Patient has protein caloric malnutrition.  Assessment: Assessment: Osteomyelitis left ankle status post excision of the fibular plate with gangrenous necrotic wound over the medial malleolus with exposed hardware.  With protein caloric malnutrition.  Plan: Plan: Discussed with the patient the only way to treat the gangrene and osteomyelitis of the medial malleolus with the associated osteomyelitis of the fibula would be to proceed with a transtibial amputation.  Patient does not have the circulation at the level of the ankle to the heel the gangrenous necrotic wound over the medial malleolus.  Patient states that she is not ready to consider amputation surgery at this time.  Feel that it would be safe for discharge to skilled nursing with IV vancomycin with dressing changes over the medial malleolus.  Once the wound VAC has expired dry dressing changes for the medial and lateral malleolar wounds.  I anticipate patient will have progressive gangrenous changes around the ankle with progressive breakdown of the wounds and exposed bone with further subluxation and dislocation with weightbearing on the left lower extremity.  I will follow-up with the patient in 2 weeks.  Patient wishes to be discharged to skilled nursing at this  time.  Thank you for the consult and the opportunity to see Ms. Stineman  Meridee Score, MD [facility] [phone] 5:32 PM

## 2018-04-06 NOTE — Progress Notes (Addendum)
PROGRESS NOTE  Ana Franco ZOX:[id number] DOB: 1926/07/08 DOA: 04/02/2018 PCP: Gaspar Garbeisovec, Richard W, MD  HPI/Brief Narrative  Ana SanesDorothy Shoemaker is a 84 y.o. year old female with medical history significant for nonhealing surgical wound status post ORIF of left ankle (02/19/2018) with eventual hardware removal on 03/31/2018, HTN, HLD, A. fib not on anticoagulation, anemia, depression, GERD.[name]. Recently here with wound dehiscence from ORIF with exposed hardware and underwent hardware removal on 03/31/2018 by Dr.Timothy Eulah PontMurphy and discharged 04/01/18 on doxycycline for 2 weeks to rehab facility. She was [facility] for follow up on 04/01/18 and found to have  Patient was directly admitted on 8/22 after intraoperative cultures resulted with MRSA resistant to doxycycline and started empirically on vancomycin.  Subjective No acute complaints . Feels pain control for leg is much better  Assessment/Plan: Wound dehiscence/MRSA infection of left ankle(previous ORIF on 02/2018) status post hardware removal 03/31/2018, stable.  Some concern may need more orthopedic intervention based on xray this admission, Discussed with Dr. Eulah PontMurphy, currently awaiting Dr. Audrie Liauda's evaluation. Continue IV vancomycin for total of 6 weeks, end date 05/14/2018 as directed by infectious disease consultants due to concern for early osteomyelitis.  Outpatient antibiotic therapy arranged x-ray on 8/24 shows interval development of left fibular fraction.  Patient remains afebrile with no signs of sepsis.  Ortho and infectious disease following.     Chronic anemia, stable.  Hemoglobin back at baseline. Has known iron deficiency anemia, suspect some acute changes changes related to blood loss from recent procedure, on iron continue.  Paroxysmal atrial fibrillation, rate controlled.  Currently normal sinus rhythm.  Not on anticoagulation due to fall risk.  Hypertension, elevated currently with SBP's in 160s, increased to  amlodipine 10 mg.  Continue home metoprolol 100 mg a.m., 25 mg p.m., losartan 100 mg daily.  Closely monitor  CKD, stage III, creatinine stable at baseline.  Monitor BMP.  Avoid nephrotoxic medications  Code Status: Full   Family Communication: no family at bedside   Disposition Plan: Awaiting management of displaced fracture. Discussed with Dr. Eulah PontMurphy, Awaiting Dr. Audrie Liauda's evalaution and assessment   Consultants:   Orthopedics, Infectious Disease  Procedures:  none   Antimicrobials: Anti-infectives (From admission, onward)   Start     Dose/Rate Route Frequency Ordered Stop   04/05/18 0000  vancomycin IVPB     1,500 mg Intravenous Every 24 hours 04/05/18 1419     04/03/18 2100  vancomycin (VANCOCIN) 1,250 mg in sodium chloride 0.9 % 250 mL IVPB  Status:  Discontinued     1,250 mg 166.7 mL/hr over 90 Minutes Intravenous Every 24 hours 04/02/18 1956 04/03/18 0912   04/03/18 2100  vancomycin (VANCOCIN) 1,500 mg in sodium chloride 0.9 % 500 mL IVPB     1,500 mg 250 mL/hr over 120 Minutes Intravenous Every 24 hours 04/03/18 0912     04/02/18 1930  vancomycin (VANCOCIN) 1,250 mg in sodium chloride 0.9 % 250 mL IVPB     1,250 mg 166.7 mL/hr over 90 Minutes Intravenous  Once 04/02/18 1919 04/02/18 2340        Cultures:  none  Telemetry: no  DVT prophylaxis: SCDs   Objective: Vitals:   04/05/18 0608 04/05/18 1325 04/05/18 2102 04/06/18 0422  BP: (!) 174/71 (!) 150/54 (!) 135/59 (!) 161/72  Pulse: 77 88 (!) 53 75  Resp: 16 18 16 16   Temp: 97.8 F (36.6 C) 97.7 F (36.5 C) 97.9 F (36.6 C) 98.4 F (36.9 C)  TempSrc:  Oral Oral Oral Oral  SpO2: 97% 97% 97% 95%  Weight:      Height:        Intake/Output Summary (Last 24 hours) at 04/06/2018 1305 Last data filed at 04/06/2018 1126 Gross per 24 hour  Intake 860 ml  Output 1000 ml  Net -140 ml   Filed Weights   04/02/18 1831  Weight: 95.8 kg    Exam:  Elderly female lying in bed comfortably, no  distress EOMI, anicteric sclera Normal dentition, moist oral mucosa Praveena in place on left leg, brace on left leg, No focal neurologic deficits Alert and oriented x3 Normal affect, appropriate mood  Data Reviewed: CBC: Recent Labs  Lab 03/31/18 1000 04/02/18 1938 04/03/18 0512 04/04/18 0911  WBC 16.1* 10.0 9.3 11.9*  NEUTROABS  --  5.0  --  5.8  HGB 9.1* 7.3* 8.3* 10.5*  HCT 29.1* 23.5* 27.1* 33.6*  MCV 93.3 95.1 95.1 94.6  PLT 439* 298 254 322   Basic Metabolic Panel: Recent Labs  Lab 03/31/18 1000 04/02/18 1938 04/03/18 0512 04/04/18 0911  NA 142 138 138 138  K 3.9 3.9 3.9 3.8  CL 107 105 107 104  CO2 24 24 26 26   GLUCOSE 110* 156* 104* 112*  BUN 30* 16 12 9   CREATININE 1.05* 1.01* 0.88 0.74  CALCIUM 9.6 8.8* 8.8* 8.8*  MG  --   --  1.8  --    GFR: Estimated Creatinine Clearance: 54.5 mL/min (by C-G formula based on SCr of 0.74 mg/dL). Liver Function Tests: Recent Labs  Lab 04/04/18 0911  AST 33  ALT 10  ALKPHOS 89  BILITOT 0.7  PROT 6.0*  ALBUMIN 2.7*   No results for input(s): LIPASE, AMYLASE in the last 168 hours. No results for input(s): AMMONIA in the last 168 hours. Coagulation Profile: No results for input(s): INR, PROTIME in the last 168 hours. Cardiac Enzymes: Recent Labs  Lab 04/02/18 2200  TROPONINI 0.04*   BNP (last 3 results) No results for input(s): PROBNP in the last 8760 hours. HbA1C: No results for input(s): HGBA1C in the last 72 hours. CBG: No results for input(s): GLUCAP in the last 168 hours. Lipid Profile: No results for input(s): CHOL, HDL, LDLCALC, TRIG, CHOLHDL, LDLDIRECT in the last 72 hours. Thyroid Function Tests: No results for input(s): TSH, T4TOTAL, FREET4, T3FREE, THYROIDAB in the last 72 hours. Anemia Panel: No results for input(s): VITAMINB12, FOLATE, FERRITIN, TIBC, IRON, RETICCTPCT in the last 72 hours. Urine analysis:    Component Value Date/Time   COLORURINE YELLOW 06/13/2014 0018   APPEARANCEUR  CLOUDY (A) 06/13/2014 0018   LABSPEC 1.018 06/13/2014 0018   PHURINE 5.0 06/13/2014 0018   GLUCOSEU NEGATIVE 06/13/2014 0018   HGBUR MODERATE (A) 06/13/2014 0018   BILIRUBINUR NEGATIVE 06/13/2014 0018   KETONESUR NEGATIVE 06/13/2014 0018   PROTEINUR NEGATIVE 06/13/2014 0018   UROBILINOGEN 0.2 06/13/2014 0018   NITRITE POSITIVE (A) 06/13/2014 0018   LEUKOCYTESUR LARGE (A) 06/13/2014 0018   Sepsis Labs: @LABRCNTIP (procalcitonin:4,lacticidven:4)  ) Recent Results (from the past 240 hour(s))  Aerobic Culture (superficial specimen)     Status: None   Collection Time: 03/31/18 12:41 PM  Result Value Ref Range Status   Specimen Description   Final    LEG LEFT Performed at [facility], [address] [place], [place] [address]    Special Requests   Final    NONE Performed at [facility], [address], [place], [place] [address]  Gram Stain   Final    NO WBC SEEN RARE GRAM POSITIVE COCCI IN PAIRS Gram Stain Report Called to,Read Back By and Verified With: T.GREEN AT 1450 ON 03/31/18 BY N.THOMPSON Performed at [facility], [address] [place], [place] [address]    Culture   Final    MODERATE METHICILLIN RESISTANT STAPHYLOCOCCUS AUREUS   Report Status 04/03/2018 FINAL  Final   Organism ID, Bacteria METHICILLIN RESISTANT STAPHYLOCOCCUS AUREUS  Final      Susceptibility   Methicillin resistant staphylococcus aureus - MIC*    CIPROFLOXACIN >=8 RESISTANT Resistant     ERYTHROMYCIN <=0.25 SENSITIVE Sensitive     GENTAMICIN <=0.5 SENSITIVE Sensitive     OXACILLIN >=4 RESISTANT Resistant     TETRACYCLINE >=16 RESISTANT Resistant     VANCOMYCIN 1 SENSITIVE Sensitive     TRIMETH/SULFA >=320 RESISTANT Resistant     CLINDAMYCIN <=0.25 SENSITIVE Sensitive     RIFAMPIN <=0.5 SENSITIVE Sensitive     Inducible Clindamycin NEGATIVE Sensitive     * MODERATE METHICILLIN RESISTANT STAPHYLOCOCCUS AUREUS  Aerobic/Anaerobic  Culture (surgical/deep wound)     Status: None   Collection Time: 03/31/18  1:14 PM  Result Value Ref Range Status   Specimen Description TISSUE LEFT LEG  Final   Special Requests   Final    NONE Performed at [facility], [address], [place], [place] [address]    Gram Stain   Final    NO WBC SEEN RARE GRAM POSITIVE COCCI IN PAIRS Gram Stain Report Called to,Read Back By and Verified With: T.GREEN AT 1450 ON 03/31/18 BY N.THOMPSON Performed at [facility], [address], [place], [place] [address]    Culture   Final    FEW METHICILLIN RESISTANT STAPHYLOCOCCUS AUREUS NO ANAEROBES ISOLATED Performed at [facility], [address] [place], [place] [address]    Report Status 04/05/2018 FINAL  Final   Organism ID, Bacteria METHICILLIN RESISTANT STAPHYLOCOCCUS AUREUS  Final      Susceptibility   Methicillin resistant staphylococcus aureus - MIC*    CIPROFLOXACIN >=8 RESISTANT Resistant     ERYTHROMYCIN <=0.25 SENSITIVE Sensitive     GENTAMICIN <=0.5 SENSITIVE Sensitive     OXACILLIN >=4 RESISTANT Resistant     TETRACYCLINE >=16 RESISTANT Resistant     VANCOMYCIN <=0.5 SENSITIVE Sensitive     TRIMETH/SULFA >=320 RESISTANT Resistant     CLINDAMYCIN <=0.25 SENSITIVE Sensitive     RIFAMPIN <=0.5 SENSITIVE Sensitive     Inducible Clindamycin NEGATIVE Sensitive     * FEW METHICILLIN RESISTANT STAPHYLOCOCCUS AUREUS      Studies: No results found.  Scheduled Meds: . amLODipine  10 mg Oral Daily  . aspirin EC  325 mg Oral Daily  . citalopram  20 mg Oral Daily  . ezetimibe-simvastatin  1 tablet Oral QHS  . ferrous sulfate  325 mg Oral BID WC  . losartan  100 mg Oral Daily  . metoprolol tartrate  100 mg Oral Daily  . metoprolol tartrate  25 mg Oral QPM  . pantoprazole  40 mg Oral Daily    Continuous Infusions: . vancomycin 1,500 mg (04/05/18 2056)     LOS: 4 days     Laverna Peace, MD [facility] Pager  [phone]  If 7PM-7AM, please contact night-coverage [email or web address] Password [email or web address] 04/06/2018, 1:05 PM

## 2018-04-07 ENCOUNTER — Encounter (HOSPITAL_COMMUNITY): Payer: Self-pay

## 2018-04-07 ENCOUNTER — Ambulatory Visit (INDEPENDENT_AMBULATORY_CARE_PROVIDER_SITE_OTHER): Payer: Self-pay | Admitting: Physician Assistant

## 2018-04-07 NOTE — Progress Notes (Signed)
PROGRESS NOTE  Sherline Eberwein MVH:[id number] DOB: 07/21/1926 DOA: 04/02/2018 PCP: Gaspar Garbe, MD  HPI/Brief Narrative  Ana Franco is a 84 y.o. year old female with medical history significant for nonhealing surgical wound status post ORIF of left ankle (02/19/2018) with eventual hardware removal on 03/31/2018, HTN, HLD, A. fib not on anticoagulation, anemia, depression, GERD.[name] Recently here with wound dehiscence from ORIF with exposed hardware and underwent hardware removal on 03/31/2018 by Dr.Timothy Eulah Pont and discharged 04/01/18 on doxycycline for 2 weeks to rehab facility. She was seen at [facility] for follow up on 04/01/18 and found to have  Patient was directly admitted on 8/22 after intraoperative cultures resulted with MRSA resistant to doxycycline and started empirically on vancomycin.  Initial plan was to continue IV vancomycin for additional 6 weeks of therapy (* end date 05/14/18) with follow up with Dr. Orvan Falconer with ID and Dr. Eulah Pont her orthopedic surgeon.  However upon further evaluation by Dr. Lajoyce Corners on 8/26 concern for gangrenous necrotic wound over the medial malleolus with exposed hardware in addition to associated osteomyelitis that would require amputation.  Patient initially declined surgery and wished to return to SNF on 8/26; discuss further with family on 8/27 who now agreeable to pursuing surgery.  Discussed with Dr. Lajoyce Corners and plan for patient be taken to the OR on Friday, 04/10/2018.  Subjective No acute complaints . Feels pain control for leg is much better  Assessment/Plan: Wound dehiscence/MRSA infection of left ankle(previous ORIF on 02/2018) status post hardware removal 03/31/2018, stable.  No confirmed radiographic evidence of osteomyelitis however given exposed hardware and now concern for development of gangrenous necrosis of wound IV antibiotics alone are not sufficient for therapy.  Dr. Lajoyce Corners plans for amputation on 04/10/2018.  Patient remains  afebrile with no signs of sepsis.  Ortho and infectious disease following.     Chronic anemia, stable.  Hemoglobin back at baseline. Has known iron deficiency anemia, suspect some acute changes changes related to blood loss from recent procedure, on iron continue.  Paroxysmal atrial fibrillation, rate controlled.  Currently normal sinus rhythm.  Not on anticoagulation due to fall risk.  Hypertension, improving currently. Have had difficulty with previous SBP is in the 160s requiring increase of amlodipine to 10 mg.  Continue home metoprolol 100 mg a.m., 25 mg p.m., losartan 100 mg daily.  Closely monitor  CKD, stage III, creatinine stable at baseline.  Monitor BMP.  Avoid nephrotoxic medications  Code Status: Full, son wanted to discuss code status again with him present as given her age he wonders if she would consider DNR/DNI, attempted on 04/07/18 but son was not present in room.   Family Communication: discussed plan with son at bedside  Disposition Plan: plan fo for amputation on 04/10/2018.  Consultants:   Orthopedics, Infectious Disease  Procedures:  none   Antimicrobials: Anti-infectives (From admission, onward)   Start     Dose/Rate Route Frequency Ordered Stop   04/07/18 0600  vancomycin (VANCOCIN) 1,250 mg in sodium chloride 0.9 % 250 mL IVPB     1,250 mg 166.7 mL/hr over 90 Minutes Intravenous Every 24 hours 04/06/18 2104     04/06/18 0000  vancomycin IVPB     1,500 mg Intravenous Every 24 hours 04/06/18 1456 05/14/18 2359   04/05/18 0000  vancomycin IVPB  Status:  Discontinued     1,500 mg Intravenous Every 24 hours 04/05/18 1419 04/06/18    04/03/18 2100  vancomycin (VANCOCIN) 1,250 mg in sodium chloride  0.9 % 250 mL IVPB  Status:  Discontinued     1,250 mg 166.7 mL/hr over 90 Minutes Intravenous Every 24 hours 04/02/18 1956 04/03/18 0912   04/03/18 2100  vancomycin (VANCOCIN) 1,500 mg in sodium chloride 0.9 % 500 mL IVPB  Status:  Discontinued     1,500 mg 250  mL/hr over 120 Minutes Intravenous Every 24 hours 04/03/18 0912 04/06/18 2104   04/02/18 1930  vancomycin (VANCOCIN) 1,250 mg in sodium chloride 0.9 % 250 mL IVPB     1,250 mg 166.7 mL/hr over 90 Minutes Intravenous  Once 04/02/18 1919 04/02/18 2340        Cultures:  none  Telemetry: no  DVT prophylaxis: SCDs   Objective: Vitals:   04/06/18 2133 04/07/18 0539 04/07/18 1110 04/07/18 1357  BP: 136/63 (!) 156/74 (!) 144/61 (!) 128/100  Pulse: 65 66 86 65  Resp: 16 18 18 18   Temp: 98.5 F (36.9 C) 98.1 F (36.7 C) 97.8 F (36.6 C) 98 F (36.7 C)  TempSrc: Oral Oral Oral Oral  SpO2: 94% 98% 97% 97%  Weight:      Height:        Intake/Output Summary (Last 24 hours) at 04/07/2018 1557 Last data filed at 04/07/2018 1358 Gross per 24 hour  Intake 480 ml  Output 500 ml  Net -20 ml   Filed Weights   04/02/18 1831  Weight: 95.8 kg    Exam:  Elderly female lying in bed comfortably, no distress EOMI, anicteric sclera Normal dentition, moist oral mucosa Praveena in place on left leg, brace on left leg, No focal neurologic deficits Alert and oriented x3 Normal affect, appropriate mood  Data Reviewed: CBC: Recent Labs  Lab 04/02/18 1938 04/03/18 0512 04/04/18 0911  WBC 10.0 9.3 11.9*  NEUTROABS 5.0  --  5.8  HGB 7.3* 8.3* 10.5*  HCT 23.5* 27.1* 33.6*  MCV 95.1 95.1 94.6  PLT 298 254 322   Basic Metabolic Panel: Recent Labs  Lab 04/02/18 1938 04/03/18 0512 04/04/18 0911  NA 138 138 138  K 3.9 3.9 3.8  CL 105 107 104  CO2 24 26 26   GLUCOSE 156* 104* 112*  BUN 16 12 9   CREATININE 1.01* 0.88 0.74  CALCIUM 8.8* 8.8* 8.8*  MG  --  1.8  --    GFR: Estimated Creatinine Clearance: 54.5 mL/min (by C-G formula based on SCr of 0.74 mg/dL). Liver Function Tests: Recent Labs  Lab 04/04/18 0911  AST 33  ALT 10  ALKPHOS 89  BILITOT 0.7  PROT 6.0*  ALBUMIN 2.7*   No results for input(s): LIPASE, AMYLASE in the last 168 hours. No results for input(s):  AMMONIA in the last 168 hours. Coagulation Profile: No results for input(s): INR, PROTIME in the last 168 hours. Cardiac Enzymes: Recent Labs  Lab 04/02/18 2200  TROPONINI 0.04*   BNP (last 3 results) No results for input(s): PROBNP in the last 8760 hours. HbA1C: No results for input(s): HGBA1C in the last 72 hours. CBG: No results for input(s): GLUCAP in the last 168 hours. Lipid Profile: No results for input(s): CHOL, HDL, LDLCALC, TRIG, CHOLHDL, LDLDIRECT in the last 72 hours. Thyroid Function Tests: No results for input(s): TSH, T4TOTAL, FREET4, T3FREE, THYROIDAB in the last 72 hours. Anemia Panel: No results for input(s): VITAMINB12, FOLATE, FERRITIN, TIBC, IRON, RETICCTPCT in the last 72 hours. Urine analysis:    Component Value Date/Time   COLORURINE YELLOW 06/13/2014 0018   APPEARANCEUR CLOUDY (A) 06/13/2014 0018  LABSPEC 1.018 06/13/2014 0018   PHURINE 5.0 06/13/2014 0018   GLUCOSEU NEGATIVE 06/13/2014 0018   HGBUR MODERATE (A) 06/13/2014 0018   BILIRUBINUR NEGATIVE 06/13/2014 0018   KETONESUR NEGATIVE 06/13/2014 0018   PROTEINUR NEGATIVE 06/13/2014 0018   UROBILINOGEN 0.2 06/13/2014 0018   NITRITE POSITIVE (A) 06/13/2014 0018   LEUKOCYTESUR LARGE (A) 06/13/2014 0018   Sepsis Labs: @LABRCNTIP (procalcitonin:4,lacticidven:4)  ) Recent Results (from the past 240 hour(s))  Aerobic Culture (superficial specimen)     Status: None   Collection Time: 03/31/18 12:41 PM  Result Value Ref Range Status   Specimen Description   Final    LEG LEFT Performed at [facility], [address] [place], [place] [address]    Special Requests   Final    NONE Performed at [facility], [address] [place], [place] [address]    Gram Stain   Final    NO WBC SEEN RARE GRAM POSITIVE COCCI IN PAIRS Gram Stain Report Called to,Read Back By and Verified With: T.GREEN AT 1450 ON 03/31/18 BY N.THOMPSON Performed at [facility], [address] [place], [place] [address]    Culture   Final    MODERATE METHICILLIN RESISTANT STAPHYLOCOCCUS AUREUS   Report Status 04/03/2018 FINAL  Final   Organism ID, Bacteria METHICILLIN RESISTANT STAPHYLOCOCCUS AUREUS  Final      Susceptibility   Methicillin resistant staphylococcus aureus - MIC*    CIPROFLOXACIN >=8 RESISTANT Resistant     ERYTHROMYCIN <=0.25 SENSITIVE Sensitive     GENTAMICIN <=0.5 SENSITIVE Sensitive     OXACILLIN >=4 RESISTANT Resistant     TETRACYCLINE >=16 RESISTANT Resistant     VANCOMYCIN 1 SENSITIVE Sensitive     TRIMETH/SULFA >=320 RESISTANT Resistant     CLINDAMYCIN <=0.25 SENSITIVE Sensitive     RIFAMPIN <=0.5 SENSITIVE Sensitive     Inducible Clindamycin NEGATIVE Sensitive     * MODERATE METHICILLIN RESISTANT STAPHYLOCOCCUS AUREUS  Aerobic/Anaerobic Culture (surgical/deep wound)     Status: None   Collection Time: 03/31/18  1:14 PM  Result Value Ref Range Status   Specimen Description TISSUE LEFT LEG  Final   Special Requests   Final    NONE Performed at [facility], [address] [place], [place] [address]    Gram Stain   Final    NO WBC SEEN RARE GRAM POSITIVE COCCI IN PAIRS Gram Stain Report Called to,Read Back By and Verified With: T.GREEN AT 1450 ON 03/31/18 BY N.THOMPSON Performed at [facility], [address] [place], [place] [address]    Culture   Final    FEW METHICILLIN RESISTANT STAPHYLOCOCCUS AUREUS NO ANAEROBES ISOLATED Performed at [facility], [address] [place], [place] [address]    Report Status 04/05/2018 FINAL  Final   Organism ID, Bacteria METHICILLIN RESISTANT STAPHYLOCOCCUS AUREUS  Final      Susceptibility   Methicillin resistant staphylococcus aureus - MIC*    CIPROFLOXACIN >=8 RESISTANT Resistant     ERYTHROMYCIN <=0.25 SENSITIVE Sensitive     GENTAMICIN <=0.5 SENSITIVE Sensitive     OXACILLIN >=4 RESISTANT Resistant     TETRACYCLINE >=16  RESISTANT Resistant     VANCOMYCIN <=0.5 SENSITIVE Sensitive     TRIMETH/SULFA >=320 RESISTANT Resistant     CLINDAMYCIN <=0.25 SENSITIVE Sensitive     RIFAMPIN <=0.5 SENSITIVE Sensitive     Inducible Clindamycin NEGATIVE Sensitive     * FEW METHICILLIN RESISTANT STAPHYLOCOCCUS AUREUS  Studies: No results found.  Scheduled Meds: . amLODipine  10 mg Oral Daily  . aspirin EC  325 mg Oral Daily  . citalopram  20 mg Oral Daily  . ezetimibe-simvastatin  1 tablet Oral QHS  . ferrous sulfate  325 mg Oral BID WC  . losartan  100 mg Oral Daily  . metoprolol tartrate  100 mg Oral Daily  . metoprolol tartrate  25 mg Oral QPM  . pantoprazole  40 mg Oral Daily    Continuous Infusions: . vancomycin 1,250 mg (04/07/18 0834)     LOS: 5 days     Laverna Peace, MD [facility] Pager [phone]  If 7PM-7AM, please contact night-coverage [email or web address] Password [facility] 04/07/2018, 3:57 PM

## 2018-04-08 LAB — CBC
HCT: 27.5 % — ABNORMAL LOW (ref 36.0–46.0)
Hemoglobin: 8.6 g/dL — ABNORMAL LOW (ref 12.0–15.0)
MCH: 29.9 pg (ref 26.0–34.0)
MCHC: 31.3 g/dL (ref 30.0–36.0)
MCV: 95.5 fL (ref 78.0–100.0)
Platelets: 230 10*3/uL (ref 150–400)
RBC: 2.88 MIL/uL — ABNORMAL LOW (ref 3.87–5.11)
RDW: 14 % (ref 11.5–15.5)
WBC: 10.2 10*3/uL (ref 4.0–10.5)

## 2018-04-08 LAB — BASIC METABOLIC PANEL
Anion gap: 8 (ref 5–15)
BUN: 13 mg/dL (ref 8–23)
CO2: 27 mmol/L (ref 22–32)
Calcium: 8.4 mg/dL — ABNORMAL LOW (ref 8.9–10.3)
Chloride: 104 mmol/L (ref 98–111)
Creatinine, Ser: 1.25 mg/dL — ABNORMAL HIGH (ref 0.44–1.00)
GFR calc Af Amer: 43 mL/min — ABNORMAL LOW (ref >60)
GFR calc non Af Amer: 37 mL/min — ABNORMAL LOW (ref >60)
Glucose, Bld: 99 mg/dL (ref 70–99)
Potassium: 3.3 mmol/L — ABNORMAL LOW (ref 3.5–5.1)
Sodium: 139 mmol/L (ref 135–145)

## 2018-04-08 MED ORDER — POTASSIUM CHLORIDE CRYS ER 20 MEQ PO TBCR
20.0000 meq | EXTENDED_RELEASE_TABLET | Freq: Once | ORAL | Status: AC
  Administered 2018-04-08: 20 meq via ORAL
  Filled 2018-04-08: qty 1

## 2018-04-08 MED ORDER — CHLORHEXIDINE GLUCONATE 4 % EX LIQD
60.0000 mL | Freq: Once | CUTANEOUS | Status: AC
  Administered 2018-04-10: 4 via TOPICAL

## 2018-04-08 MED ORDER — CEFAZOLIN SODIUM-DEXTROSE 2-4 GM/100ML-% IV SOLN
2.0000 g | INTRAVENOUS | Status: AC
  Administered 2018-04-10: 2 g via INTRAVENOUS
  Filled 2018-04-08: qty 100

## 2018-04-08 MED ORDER — POLYETHYLENE GLYCOL 3350 17 G PO PACK
17.0000 g | PACK | Freq: Every day | ORAL | Status: DC
  Administered 2018-04-08 – 2018-04-13 (×6): 17 g via ORAL
  Filled 2018-04-08 (×6): qty 1

## 2018-04-08 NOTE — Plan of Care (Signed)
  Problem: Education: Goal: Knowledge of General Education information will improve Description: Including pain rating scale, medication(s)/side effects and non-pharmacologic comfort measures Outcome: Progressing   Problem: Nutrition: Goal: Adequate nutrition will be maintained Outcome: Progressing   Problem: Coping: Goal: Level of anxiety will decrease Outcome: Progressing   

## 2018-04-08 NOTE — Progress Notes (Signed)
PROGRESS NOTE  Ana Franco UJW:[id number] DOB: 1926/01/16 DOA: 04/02/2018 PCP: Gaspar Garbeisovec, Richard W, MD  HPI/Brief Narrative  Ana Franco is a 84 y.o. year old female with medical history significant for nonhealing surgical wound status post ORIF of left ankle (02/19/2018) with eventual hardware removal on 03/31/2018, HTN, HLD, A. fib not on anticoagulation, anemia, depression, GERD.[name]. Recently here with wound dehiscence from ORIF with exposed hardware and underwent hardware removal on 03/31/2018 by Dr.Timothy Eulah PontMurphy and discharged 04/01/18 on doxycycline for 2 weeks to rehab facility. She was seen at [facility] for follow up on 04/01/18 and found to have  Patient was directly admitted on 8/22 after intraoperative cultures resulted with MRSA resistant to doxycycline and started empirically on vancomycin.  Initial plan was to continue IV vancomycin for additional 6 weeks of therapy (* end date 05/14/18) with follow up with Dr. Orvan Falconerampbell with ID and Dr. Eulah PontMurphy her orthopedic surgeon.  However upon further evaluation by Dr. Lajoyce Cornersuda on 8/26 concern for gangrenous necrotic wound over the medial malleolus with exposed hardware in addition to associated osteomyelitis that would require amputation.  Patient initially declined surgery and wished to return to SNF on 8/26; discuss further with family on 8/27 who now agreeable to pursuing surgery.  Discussed with Dr. Lajoyce Cornersuda and plan for patient be taken to the OR on Friday, 04/10/2018.  Subjective Report mild pain LE.    Assessment/Plan: Wound dehiscence/MRSA infection of left ankle(previous ORIF on 02/2018) status post hardware removal 03/31/2018, stable.  No confirmed radiographic evidence of osteomyelitis however given exposed hardware and now concern for development of gangrenous necrosis of wound IV antibiotics alone are not sufficient for therapy.  - Dr. Lajoyce Cornersuda plans for amputation on 04/10/2018.   -awaiting sx on Friday   Chronic anemia, stable.   Hemoglobin back at baseline. Has known iron deficiency anemia.  Monitor hb,   Paroxysmal atrial fibrillation, rate controlled.  Currently normal sinus rhythm.  Not on anticoagulation due to fall risk.  Hypertension, improving currently. Continue with amlodipine and metoprolol.  Mild increase cr at 1.25.  Hold cozaar.   CKD, stage III, creatinine stable at baseline.   Mild increase cr at 1.2. Hold cozaar.  repeat labs in am.   Hypokalemia replete   Code Status: discussed code status with patient, son at bedside. Patient wishes to be DNR. thye understand that Code status is not follow during OR>    Family Communication: discussed with son  Disposition Plan: plan fo for amputation on 04/10/2018.  Consultants:   Orthopedics, Infectious Disease  Procedures:  none   Antimicrobials:  Vancomycin 8-22  Cultures:  none  Telemetry: no  DVT prophylaxis: SCDs   Objective: Vitals:   04/07/18 2047 04/08/18 0457 04/08/18 1507 04/08/18 1717  BP: (!) 138/44 (!) 161/77 (!) 134/52 (!) 133/52  Pulse: 60 72 60 (!) 56  Resp: 16 16 18    Temp: 98.7 F (37.1 C) 98.2 F (36.8 C) 98.4 F (36.9 C)   TempSrc: Oral Oral Oral   SpO2: 96% 96% 97%   Weight:      Height:        Intake/Output Summary (Last 24 hours) at 04/08/2018 1800 Last data filed at 04/08/2018 1100 Gross per 24 hour  Intake 450 ml  Output 1425 ml  Net -975 ml   Filed Weights   04/02/18 1831  Weight: 95.8 kg    Exam:  General; No acute distress CVS; S 1, S 2 RRR LE; Praveena in place on  left leg, brace on left leg, Abdomen; soft, nt, nd Neuro; alert, oriented. Non focal.     Data Reviewed: CBC: Recent Labs  Lab 04/02/18 1938 04/03/18 0512 04/04/18 0911 04/08/18 0400  WBC 10.0 9.3 11.9* 10.2  NEUTROABS 5.0  --  5.8  --   HGB 7.3* 8.3* 10.5* 8.6*  HCT 23.5* 27.1* 33.6* 27.5*  MCV 95.1 95.1 94.6 95.5  PLT 298 254 322 230   Basic Metabolic Panel: Recent Labs  Lab 04/02/18 1938  04/03/18 0512 04/04/18 0911 04/08/18 0400  NA 138 138 138 139  K 3.9 3.9 3.8 3.3*  CL 105 107 104 104  CO2 24 26 26 27   GLUCOSE 156* 104* 112* 99  BUN 16 12 9 13   CREATININE 1.01* 0.88 0.74 1.25*  CALCIUM 8.8* 8.8* 8.8* 8.4*  MG  --  1.8  --   --    GFR: Estimated Creatinine Clearance: 34.9 mL/min (A) (by C-G formula based on SCr of 1.25 mg/dL (H)). Liver Function Tests: Recent Labs  Lab 04/04/18 0911  AST 33  ALT 10  ALKPHOS 89  BILITOT 0.7  PROT 6.0*  ALBUMIN 2.7*   No results for input(s): LIPASE, AMYLASE in the last 168 hours. No results for input(s): AMMONIA in the last 168 hours. Coagulation Profile: No results for input(s): INR, PROTIME in the last 168 hours. Cardiac Enzymes: Recent Labs  Lab 04/02/18 2200  TROPONINI 0.04*   BNP (last 3 results) No results for input(s): PROBNP in the last 8760 hours. HbA1C: No results for input(s): HGBA1C in the last 72 hours. CBG: No results for input(s): GLUCAP in the last 168 hours. Lipid Profile: No results for input(s): CHOL, HDL, LDLCALC, TRIG, CHOLHDL, LDLDIRECT in the last 72 hours. Thyroid Function Tests: No results for input(s): TSH, T4TOTAL, FREET4, T3FREE, THYROIDAB in the last 72 hours. Anemia Panel: No results for input(s): VITAMINB12, FOLATE, FERRITIN, TIBC, IRON, RETICCTPCT in the last 72 hours. Urine analysis:    Component Value Date/Time   COLORURINE YELLOW 06/13/2014 0018   APPEARANCEUR CLOUDY (A) 06/13/2014 0018   LABSPEC 1.018 06/13/2014 0018   PHURINE 5.0 06/13/2014 0018   GLUCOSEU NEGATIVE 06/13/2014 0018   HGBUR MODERATE (A) 06/13/2014 0018   BILIRUBINUR NEGATIVE 06/13/2014 0018   KETONESUR NEGATIVE 06/13/2014 0018   PROTEINUR NEGATIVE 06/13/2014 0018   UROBILINOGEN 0.2 06/13/2014 0018   NITRITE POSITIVE (A) 06/13/2014 0018   LEUKOCYTESUR LARGE (A) 06/13/2014 0018   Sepsis Labs: @LABRCNTIP (procalcitonin:4,lacticidven:4)  ) Recent Results (from the past 240 hour(s))  Aerobic Culture  (superficial specimen)     Status: None   Collection Time: 03/31/18 12:41 PM  Result Value Ref Range Status   Specimen Description   Final    LEG LEFT Performed at [facility], [address], [place], [place] [address]    Special Requests   Final    NONE Performed at [facility], [address], [place], [place] [address]    Gram Stain   Final    NO WBC SEEN RARE GRAM POSITIVE COCCI IN PAIRS Gram Stain Report Called to,Read Back By and Verified With: T.GREEN AT 1450 ON 03/31/18 BY N.THOMPSON Performed at [facility], [address] [place], [place] [address]    Culture   Final    MODERATE METHICILLIN RESISTANT STAPHYLOCOCCUS AUREUS   Report Status 04/03/2018 FINAL  Final   Organism ID, Bacteria METHICILLIN RESISTANT STAPHYLOCOCCUS AUREUS  Final      Susceptibility   Methicillin  resistant staphylococcus aureus - MIC*    CIPROFLOXACIN >=8 RESISTANT Resistant     ERYTHROMYCIN <=0.25 SENSITIVE Sensitive     GENTAMICIN <=0.5 SENSITIVE Sensitive     OXACILLIN >=4 RESISTANT Resistant     TETRACYCLINE >=16 RESISTANT Resistant     VANCOMYCIN 1 SENSITIVE Sensitive     TRIMETH/SULFA >=320 RESISTANT Resistant     CLINDAMYCIN <=0.25 SENSITIVE Sensitive     RIFAMPIN <=0.5 SENSITIVE Sensitive     Inducible Clindamycin NEGATIVE Sensitive     * MODERATE METHICILLIN RESISTANT STAPHYLOCOCCUS AUREUS  Aerobic/Anaerobic Culture (surgical/deep wound)     Status: None   Collection Time: 03/31/18  1:14 PM  Result Value Ref Range Status   Specimen Description TISSUE LEFT LEG  Final   Special Requests   Final    NONE Performed at [facility], [address], [place], [place] [address]    Gram Stain   Final    NO WBC SEEN RARE GRAM POSITIVE COCCI IN PAIRS Gram Stain Report Called to,Read Back By and Verified With: T.GREEN AT 1450 ON 03/31/18 BY N.THOMPSON Performed at [facility], [address], [place], [place] [address]    Culture   Final    FEW METHICILLIN RESISTANT STAPHYLOCOCCUS AUREUS NO ANAEROBES ISOLATED Performed at [facility], [address] [place], [place] [address]    Report Status 04/05/2018 FINAL  Final   Organism ID, Bacteria METHICILLIN RESISTANT STAPHYLOCOCCUS AUREUS  Final      Susceptibility   Methicillin resistant staphylococcus aureus - MIC*    CIPROFLOXACIN >=8 RESISTANT Resistant     ERYTHROMYCIN <=0.25 SENSITIVE Sensitive     GENTAMICIN <=0.5 SENSITIVE Sensitive     OXACILLIN >=4 RESISTANT Resistant     TETRACYCLINE >=16 RESISTANT Resistant     VANCOMYCIN <=0.5 SENSITIVE Sensitive     TRIMETH/SULFA >=320 RESISTANT Resistant     CLINDAMYCIN <=0.25 SENSITIVE Sensitive     RIFAMPIN <=0.5 SENSITIVE Sensitive     Inducible Clindamycin NEGATIVE Sensitive     * FEW METHICILLIN RESISTANT STAPHYLOCOCCUS AUREUS      Studies: No results found.  Scheduled Meds: . amLODipine  10 mg Oral Daily  . aspirin EC  325 mg Oral Daily  . [START ON 04/10/2018] chlorhexidine  60 mL Topical Once  . citalopram  20 mg Oral Daily  . ezetimibe-simvastatin  1 tablet Oral QHS  . ferrous sulfate  325 mg Oral BID WC  . metoprolol tartrate  100 mg Oral Daily  . metoprolol tartrate  25 mg Oral QPM  . pantoprazole  40 mg Oral Daily  . polyethylene glycol  17 g Oral Daily    Continuous Infusions: . [START ON 04/10/2018]  ceFAZolin (ANCEF) IV    . vancomycin 1,250 mg (04/08/18 0522)     LOS: 6 days     Alba Cory, MD [facility] Pager [phone]  If 7PM-7AM, please contact night-coverage [email or web address] Password [facility] 04/08/2018, 6:00 PM

## 2018-04-08 NOTE — Progress Notes (Signed)
Physical Therapy Treatment Patient Details Name: Ana Franco MRN: [id number] DOB: [birth year] Today's Date: 04/08/2018    History of Present Illness Ana Franco is a 84 y.o. female who complains of  ORIF in early jjuly in [place]. Wound breakdown over her lateral incision with exposed hardware - to OR for lateral hardware removal and ID.  pt is NWB with CAM boot.  Pt has been in SNF at [facility] since her fall - doing transfers only.    PT Comments    Pt received in bed agreeable to participation in therapy. Min assist required for bed mobility and sit to stand. Stedy used for bed to recliner transfer. Pt positioned in recliner with feet elevated at end of session. Pt scheduled to undergo L BKA 04-10-18.    Follow Up Recommendations  SNF     Equipment Recommendations  None recommended by PT    Recommendations for Other Services       Precautions / Restrictions Precautions Precautions: Fall;Other (comment) Precaution Comments: MRSA - contact precautions.  Wound Vac intact. Required Braces or Orthoses: Other Brace/Splint Other Brace/Splint: CAM boot to L LE  Restrictions LLE Weight Bearing: Non weight bearing    Mobility  Bed Mobility Overal bed mobility: Needs Assistance Bed Mobility: Supine to Sit     Supine to sit: Min assist     General bed mobility comments: +rail, assist with LLE  Transfers Overall transfer level: Needs assistance   Transfers: Sit to/from Stand Sit to Stand: Min assist         General transfer comment: stedy used for transfer bed to recliner  Ambulation/Gait             General Gait Details: unable   Stairs             Wheelchair Mobility    Modified Rankin (Stroke Patients Only)       Balance Overall balance assessment: Needs assistance Sitting-balance support: No upper extremity supported;Feet supported Sitting balance-Leahy Scale: Fair     Standing balance support: Bilateral upper extremity  supported;During functional activity Standing balance-Leahy Scale: Poor                              Cognition Arousal/Alertness: Awake/alert Behavior During Therapy: WFL for tasks assessed/performed Overall Cognitive Status: Within Functional Limits for tasks assessed                                        Exercises      General Comments        Pertinent Vitals/Pain Pain Assessment: 0-10 Pain Score: 2  Pain Location:  LLE  Pain Descriptors / Indicators: Discomfort Pain Intervention(s): Monitored during session;Repositioned    Home Living                      Prior Function            PT Goals (current goals can now be found in the care plan section) Acute Rehab PT Goals Patient Stated Goal: to get back to SNF and heal properly PT Goal Formulation: With patient Time For Goal Achievement: 04/11/18 Potential to Achieve Goals: Good Progress towards PT goals: Progressing toward goals    Frequency    Min 3X/week      PT Plan Current plan remains appropriate    Co-evaluation  AM-PAC PT "6 Clicks" Daily Activity  Outcome Measure  Difficulty turning over in bed (including adjusting bedclothes, sheets and blankets)?: Unable Difficulty moving from lying on back to sitting on the side of the bed? : Unable Difficulty sitting down on and standing up from a chair with arms (e.g., wheelchair, bedside commode, etc,.)?: Unable Help needed moving to and from a bed to chair (including a wheelchair)?: A Lot Help needed walking in hospital room?: Total Help needed climbing 3-5 steps with a railing? : Total 6 Click Score: 7    End of Session Equipment Utilized During Treatment: Gait belt Activity Tolerance: Patient tolerated treatment well Patient left: in chair;with call bell/phone within reach Nurse Communication: Mobility status PT Visit Diagnosis: Difficulty in walking, not elsewhere classified (R26.2);Muscle  weakness (generalized) (M62.81);Pain Pain - Right/Left: Left Pain - part of body: Ankle and joints of foot     Time: [id number] PT Time Calculation (min) (ACUTE ONLY): 18 min  Charges:  $Therapeutic Activity: 8-22 mins                     Aida RaiderWendy Jameria Bradway, PT  Office # [phone] Pager [phone]    Ilda FoilGarrow, Ana Franco 04/08/2018, 12:25 PM

## 2018-04-09 ENCOUNTER — Encounter (HOSPITAL_COMMUNITY): Payer: Self-pay

## 2018-04-09 LAB — CBC
HCT: 28.1 % — ABNORMAL LOW (ref 36.0–46.0)
Hemoglobin: 8.5 g/dL — ABNORMAL LOW (ref 12.0–15.0)
MCH: 29 pg (ref 26.0–34.0)
MCHC: 30.2 g/dL (ref 30.0–36.0)
MCV: 95.9 fL (ref 78.0–100.0)
Platelets: 246 10*3/uL (ref 150–400)
RBC: 2.93 MIL/uL — ABNORMAL LOW (ref 3.87–5.11)
RDW: 14.1 % (ref 11.5–15.5)
WBC: 9.9 10*3/uL (ref 4.0–10.5)

## 2018-04-09 LAB — BASIC METABOLIC PANEL
Anion gap: 9 (ref 5–15)
BUN: 16 mg/dL (ref 8–23)
CO2: 26 mmol/L (ref 22–32)
Calcium: 8.4 mg/dL — ABNORMAL LOW (ref 8.9–10.3)
Chloride: 103 mmol/L (ref 98–111)
Creatinine, Ser: 1.29 mg/dL — ABNORMAL HIGH (ref 0.44–1.00)
GFR calc Af Amer: 41 mL/min — ABNORMAL LOW (ref >60)
GFR calc non Af Amer: 35 mL/min — ABNORMAL LOW (ref >60)
Glucose, Bld: 101 mg/dL — ABNORMAL HIGH (ref 70–99)
Potassium: 3.9 mmol/L (ref 3.5–5.1)
Sodium: 138 mmol/L (ref 135–145)

## 2018-04-09 LAB — SURGICAL PCR SCREEN
MRSA, PCR: NEGATIVE
Staphylococcus aureus: NEGATIVE

## 2018-04-09 MED ORDER — HYDRALAZINE HCL 20 MG/ML IJ SOLN
10.0000 mg | Freq: Three times a day (TID) | INTRAMUSCULAR | Status: DC | PRN
  Administered 2018-04-13: 10 mg via INTRAVENOUS
  Filled 2018-04-09: qty 1

## 2018-04-09 NOTE — Social Work (Signed)
CSW updated [facility] regarding pt upcoming procedure and they will follow for updated therapy notes and recommendations.   CSW continuing to follow.  Doy HutchingIsabel H Navjot Pilgrim, LCSWA [facility] Clinical Social Work [phone]

## 2018-04-09 NOTE — Progress Notes (Signed)
ANTIBIOTIC CONSULT NOTE   Pharmacy Consult for Vanocmycin Indication: MRSA wound infection  No Known Allergies  Patient Measurements: Height: 5\' 6"  (167.6 cm) Weight: 211 lb 3.2 oz (95.8 kg) IBW/kg (Calculated) : 59.3 Adjusted Body Weight:    Vital Signs: Temp: 98.6 F (37 C) (08/29 0452) Temp Source: Oral (08/29 0452) BP: 154/92 (08/29 0452) Pulse Rate: 70 (08/29 0452) Intake/Output from previous day: 08/28 0701 - 08/29 0700 In: 389 [P.O.:60; IV Piggyback:329] Out: 900 [Urine:900] Intake/Output from this shift: Total I/O In: 300 [P.O.:300] Out: 1200 [Urine:1200]  Labs: Recent Labs    04/08/18 0400 04/09/18 0444  WBC 10.2 9.9  HGB 8.6* 8.5*  PLT 230 246  CREATININE 1.25* 1.29*   Estimated Creatinine Clearance: 33.8 mL/min (A) (by C-G formula based on SCr of 1.29 mg/dL (H)). Recent Labs    04/06/18 1952  VANCOTROUGH 21*     Microbiology:   Medical History: Past Medical History:  Diagnosis Date  . Anemia    vitamin b 12 deficiency anemia  . 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 Chronic venous insufficiency   . Depression   . Difficulty in walking   . Displaced trimalleolar fracture of left lower leg   . Essential (primary) hypertension   . GERD (gastroesophageal reflux disease)   . History of blood transfusion    02/2018   . History of falling   . Hyperlipidemia   . Hyperlipidemia   . Hypertension   . 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 Iron deficiency   . Major depressive disorder   . Muscle weakness (generalized)   . Myocardial infarction (HCC)    [facility]stem[facility]   . Nutritional deficiency, unspecified   . Obesity   . Other lack of coordination   . Unspecified atrial fibrillation (HCC)   . Unspecified osteoarthritis, unspecified site      Assessment: On abx for MRSA in L ankle wound. Discharged to SNF on Ciprofloxacin and Doxycycline - plan for 14 days of therapy. Tissue culture from 8/20 grew MRSA (Resistant to Cipro, Oxacillin, Tetracycline, and Bactrim but S to Clinda). Ortho plannbing for amputation on 8/30 given exposed hardware and development of gangrenous necrosis. Afebrile, WBC wnl.  Tentative plan of abx thru 10/3 but may be able to stop after amputation  Goal of Therapy:  Vancomycin trough level 15-20 mcg/ml  Plan:  Continue vancomycin 1250mg  IV q24h tentatively thru 10/3 Amputation planned on 8/30 Should be able to stop abx after amputation if all infected material removed  Enzo BiNathan Balraj Brayfield, PharmD, BCPS Clinical Pharmacist Phone number [phone] 04/09/2018 12:37 PM

## 2018-04-09 NOTE — Progress Notes (Signed)
PROGRESS NOTE  Ana SanesDorothy Twining ZOX:[id number] DOB: 11-08-1925 DOA: 04/02/2018 PCP: Gaspar Garbeisovec, Richard W, MD  HPI/Brief Narrative  Ana Franco is a 84 y.o. year old female with medical history significant for nonhealing surgical wound status post ORIF of left ankle (02/19/2018) with eventual hardware removal on 03/31/2018, HTN, HLD, A. fib not on anticoagulation, anemia, depression, GERD.[name]. Recently here with wound dehiscence from ORIF with exposed hardware and underwent hardware removal on 03/31/2018 by Dr.Timothy Eulah PontMurphy and discharged 04/01/18 on doxycycline for 2 weeks to rehab facility. [facility] for follow up on 04/01/18 and found to have  Patient was directly admitted on 8/22 after intraoperative cultures resulted with MRSA resistant to doxycycline and started empirically on vancomycin.  Initial plan was to continue IV vancomycin for additional 6 weeks of therapy (* end date 05/14/18) with follow up with Dr. Orvan Falconerampbell with ID and Dr. Eulah PontMurphy her orthopedic surgeon.  However upon further evaluation by Dr. Lajoyce Cornersuda on 8/26 concern for gangrenous necrotic wound over the medial malleolus with exposed hardware in addition to associated osteomyelitis that would require amputation.  Patient initially declined surgery and wished to return to SNF on 8/26; discuss further with family on 8/27 who now agreeable to pursuing surgery.  Discussed with Dr. Lajoyce Cornersuda and plan for patient be taken to the OR on Friday, 04/10/2018.  Subjective Feeling ok, would like to know if she would get nerve block or general anesthesia.    Assessment/Plan: Wound dehiscence/MRSA infection of left ankle(previous ORIF on 02/2018) status post hardware removal 03/31/2018, stable.  No confirmed radiographic evidence of osteomyelitis however given exposed hardware and now concern for development of gangrenous necrosis of wound IV antibiotics alone are not sufficient for therapy.  - Dr. Lajoyce Cornersuda plans for amputation on  04/10/2018.   -awaiting sx  Tomorrow.   Chronic anemia, stable.  Hb baseline 9--10. Has known iron deficiency anemia.  Monitor hb,  Continue with iron.   Paroxysmal atrial fibrillation, rate controlled.   Currently normal sinus rhythm.  Not on anticoagulation due to fall risk.  Hypertension, improving currently. Continue with amlodipine and metoprolol.  Mild increase cr at 1.25. Hold cozaar.  PRN hydralazine.   CKD, stage III, creatinine stable at baseline.   Mild increase cr at 1.2. Hold cozaar.  Stable.   Hypokalemia replete   Code Status: discussed code status with patient, son at bedside. Patient wishes to be DNR. thye understand that Code status is not follow during OR>    Family Communication: discussed with son  Disposition Plan: plan fo for amputation on 04/10/2018.  Consultants:   Orthopedics, Infectious Disease  Procedures:  none   Antimicrobials:  Vancomycin 8-22  Cultures:  none  Telemetry: no  DVT prophylaxis: SCDs   Objective: Vitals:   04/08/18 1717 04/09/18 0215 04/09/18 0450 04/09/18 0452  BP: (!) 133/52 (!) 143/57 (!) 154/92 (!) 154/92  Pulse: (!) 56 61 70 70  Resp:  16 16 16   Temp:  98.5 F (36.9 C) 98.6 F (37 C) 98.6 F (37 C)  TempSrc:  Oral Oral Oral  SpO2:  97% 100% 100%  Weight:      Height:        Intake/Output Summary (Last 24 hours) at 04/09/2018 1125 Last data filed at 04/09/2018 0947 Gross per 24 hour  Intake 688.99 ml  Output 1500 ml  Net -811.01 ml   Filed Weights   04/02/18 1831  Weight: 95.8 kg    Exam:  General;  NO acute distress.  CVS; S 1, S 2 RRR LE; Praveena in place on left leg, brace on left leg, Abdomen; soft, nt, nd Neuro; alert    Data Reviewed: CBC: Recent Labs  Lab 04/02/18 1938 04/03/18 0512 04/04/18 0911 04/08/18 0400 04/09/18 0444  WBC 10.0 9.3 11.9* 10.2 9.9  NEUTROABS 5.0  --  5.8  --   --   HGB 7.3* 8.3* 10.5* 8.6* 8.5*  HCT 23.5* 27.1* 33.6* 27.5* 28.1*  MCV 95.1 95.1  94.6 95.5 95.9  PLT 298 254 322 230 246   Basic Metabolic Panel: Recent Labs  Lab 04/02/18 1938 04/03/18 0512 04/04/18 0911 04/08/18 0400 04/09/18 0444  NA 138 138 138 139 138  K 3.9 3.9 3.8 3.3* 3.9  CL 105 107 104 104 103  CO2 24 26 26 27 26   GLUCOSE 156* 104* 112* 99 101*  BUN 16 12 9 13 16   CREATININE 1.01* 0.88 0.74 1.25* 1.29*  CALCIUM 8.8* 8.8* 8.8* 8.4* 8.4*  MG  --  1.8  --   --   --    GFR: Estimated Creatinine Clearance: 33.8 mL/min (A) (by C-G formula based on SCr of 1.29 mg/dL (H)). Liver Function Tests: Recent Labs  Lab 04/04/18 0911  AST 33  ALT 10  ALKPHOS 89  BILITOT 0.7  PROT 6.0*  ALBUMIN 2.7*   No results for input(s): LIPASE, AMYLASE in the last 168 hours. No results for input(s): AMMONIA in the last 168 hours. Coagulation Profile: No results for input(s): INR, PROTIME in the last 168 hours. Cardiac Enzymes: Recent Labs  Lab 04/02/18 2200  TROPONINI 0.04*   BNP (last 3 results) No results for input(s): PROBNP in the last 8760 hours. HbA1C: No results for input(s): HGBA1C in the last 72 hours. CBG: No results for input(s): GLUCAP in the last 168 hours. Lipid Profile: No results for input(s): CHOL, HDL, LDLCALC, TRIG, CHOLHDL, LDLDIRECT in the last 72 hours. Thyroid Function Tests: No results for input(s): TSH, T4TOTAL, FREET4, T3FREE, THYROIDAB in the last 72 hours. Anemia Panel: No results for input(s): VITAMINB12, FOLATE, FERRITIN, TIBC, IRON, RETICCTPCT in the last 72 hours. Urine analysis:    Component Value Date/Time   COLORURINE YELLOW 06/13/2014 0018   APPEARANCEUR CLOUDY (A) 06/13/2014 0018   LABSPEC 1.018 06/13/2014 0018   PHURINE 5.0 06/13/2014 0018   GLUCOSEU NEGATIVE 06/13/2014 0018   HGBUR MODERATE (A) 06/13/2014 0018   BILIRUBINUR NEGATIVE 06/13/2014 0018   KETONESUR NEGATIVE 06/13/2014 0018   PROTEINUR NEGATIVE 06/13/2014 0018   UROBILINOGEN 0.2 06/13/2014 0018   NITRITE POSITIVE (A) 06/13/2014 0018    LEUKOCYTESUR LARGE (A) 06/13/2014 0018   Sepsis Labs: @LABRCNTIP (procalcitonin:4,lacticidven:4)  ) Recent Results (from the past 240 hour(s))  Aerobic Culture (superficial specimen)     Status: None   Collection Time: 03/31/18 12:41 PM  Result Value Ref Range Status   Specimen Description   Final    LEG LEFT Performed at [facility], [address], [place], [place] [address]    Special Requests   Final    NONE Performed at [facility], [address], [place], [place] [address]    Gram Stain   Final    NO WBC SEEN RARE GRAM POSITIVE COCCI IN PAIRS Gram Stain Report Called to,Read Back By and Verified With: T.GREEN AT 1450 ON 03/31/18 BY N.THOMPSON Performed at [facility], [address] [place], [place] [address]    Culture   Final  MODERATE METHICILLIN RESISTANT STAPHYLOCOCCUS AUREUS   Report Status 04/03/2018 FINAL  Final   Organism ID, Bacteria METHICILLIN RESISTANT STAPHYLOCOCCUS AUREUS  Final      Susceptibility   Methicillin resistant staphylococcus aureus - MIC*    CIPROFLOXACIN >=8 RESISTANT Resistant     ERYTHROMYCIN <=0.25 SENSITIVE Sensitive     GENTAMICIN <=0.5 SENSITIVE Sensitive     OXACILLIN >=4 RESISTANT Resistant     TETRACYCLINE >=16 RESISTANT Resistant     VANCOMYCIN 1 SENSITIVE Sensitive     TRIMETH/SULFA >=320 RESISTANT Resistant     CLINDAMYCIN <=0.25 SENSITIVE Sensitive     RIFAMPIN <=0.5 SENSITIVE Sensitive     Inducible Clindamycin NEGATIVE Sensitive     * MODERATE METHICILLIN RESISTANT STAPHYLOCOCCUS AUREUS  Aerobic/Anaerobic Culture (surgical/deep wound)     Status: None   Collection Time: 03/31/18  1:14 PM  Result Value Ref Range Status   Specimen Description TISSUE LEFT LEG  Final   Special Requests   Final    NONE Performed at [facility], [address] [place], [place] [address]    Gram Stain   Final    NO WBC SEEN RARE GRAM POSITIVE COCCI IN  PAIRS Gram Stain Report Called to,Read Back By and Verified With: T.GREEN AT 1450 ON 03/31/18 BY N.THOMPSON Performed at [facility], [address] [place], [place] [address]    Culture   Final    FEW METHICILLIN RESISTANT STAPHYLOCOCCUS AUREUS NO ANAEROBES ISOLATED Performed at [facility], [address], [place], [place] [address]    Report Status 04/05/2018 FINAL  Final   Organism ID, Bacteria METHICILLIN RESISTANT STAPHYLOCOCCUS AUREUS  Final      Susceptibility   Methicillin resistant staphylococcus aureus - MIC*    CIPROFLOXACIN >=8 RESISTANT Resistant     ERYTHROMYCIN <=0.25 SENSITIVE Sensitive     GENTAMICIN <=0.5 SENSITIVE Sensitive     OXACILLIN >=4 RESISTANT Resistant     TETRACYCLINE >=16 RESISTANT Resistant     VANCOMYCIN <=0.5 SENSITIVE Sensitive     TRIMETH/SULFA >=320 RESISTANT Resistant     CLINDAMYCIN <=0.25 SENSITIVE Sensitive     RIFAMPIN <=0.5 SENSITIVE Sensitive     Inducible Clindamycin NEGATIVE Sensitive     * FEW METHICILLIN RESISTANT STAPHYLOCOCCUS AUREUS      Studies: No results found.  Scheduled Meds: . amLODipine  10 mg Oral Daily  . aspirin EC  325 mg Oral Daily  . [START ON 04/10/2018] chlorhexidine  60 mL Topical Once  . citalopram  20 mg Oral Daily  . ezetimibe-simvastatin  1 tablet Oral QHS  . ferrous sulfate  325 mg Oral BID WC  . metoprolol tartrate  100 mg Oral Daily  . metoprolol tartrate  25 mg Oral QPM  . pantoprazole  40 mg Oral Daily  . polyethylene glycol  17 g Oral Daily    Continuous Infusions: . [START ON 04/10/2018]  ceFAZolin (ANCEF) IV    . vancomycin 1,250 mg (04/09/18 0510)     LOS: 7 days     Alba Cory, MD [facility] Pager [phone]  If 7PM-7AM, please contact night-coverage [email or web address] Password [facility] 04/09/2018, 11:25 AM

## 2018-04-09 NOTE — Social Work (Signed)
CSW hard faxed clinicals to [facility] admissions at [phone].  Doy HutchingIsabel H El Pile, LCSWA [facility] Clinical Social Work [phone]

## 2018-04-09 NOTE — Progress Notes (Signed)
Patient requesting to speak to Dr. Lajoyce Cornersuda before surgery tomorrow before she signs surgery consent.

## 2018-04-10 ENCOUNTER — Encounter (HOSPITAL_COMMUNITY)
Admission: AD | Disposition: A | Discharge status: Skilled Nursing Facility | Payer: Self-pay | Source: Skilled Nursing Facility | Attending: Internal Medicine

## 2018-04-10 ENCOUNTER — Inpatient Hospital Stay (HOSPITAL_COMMUNITY): Payer: Medicare Other | Admitting: Anesthesiology

## 2018-04-10 HISTORY — PX: AMPUTATION: SHX166

## 2018-04-10 LAB — CBC
HCT: 27.5 % — ABNORMAL LOW (ref 36.0–46.0)
Hemoglobin: 8.5 g/dL — ABNORMAL LOW (ref 12.0–15.0)
MCH: 29.7 pg (ref 26.0–34.0)
MCHC: 30.9 g/dL (ref 30.0–36.0)
MCV: 96.2 fL (ref 78.0–100.0)
Platelets: 236 10*3/uL (ref 150–400)
RBC: 2.86 MIL/uL — ABNORMAL LOW (ref 3.87–5.11)
RDW: 14.2 % (ref 11.5–15.5)
WBC: 8.9 10*3/uL (ref 4.0–10.5)

## 2018-04-10 LAB — BASIC METABOLIC PANEL
Anion gap: 7 (ref 5–15)
BUN: 16 mg/dL (ref 8–23)
CO2: 27 mmol/L (ref 22–32)
Calcium: 8.5 mg/dL — ABNORMAL LOW (ref 8.9–10.3)
Chloride: 103 mmol/L (ref 98–111)
Creatinine, Ser: 1.27 mg/dL — ABNORMAL HIGH (ref 0.44–1.00)
GFR calc Af Amer: 42 mL/min — ABNORMAL LOW (ref >60)
GFR calc non Af Amer: 36 mL/min — ABNORMAL LOW (ref >60)
Glucose, Bld: 106 mg/dL — ABNORMAL HIGH (ref 70–99)
Potassium: 4.2 mmol/L (ref 3.5–5.1)
Sodium: 137 mmol/L (ref 135–145)

## 2018-04-10 SURGERY — AMPUTATION BELOW KNEE
Anesthesia: General | Laterality: Left

## 2018-04-10 MED ORDER — PROMETHAZINE HCL 25 MG/ML IJ SOLN: 6.2500 mg | INTRAMUSCULAR | Status: DC | PRN

## 2018-04-10 MED ORDER — LIDOCAINE 2% (20 MG/ML) 5 ML SYRINGE
INTRAMUSCULAR | Status: AC
  Filled 2018-04-10: qty 5

## 2018-04-10 MED ORDER — FENTANYL CITRATE (PF) 100 MCG/2ML IJ SOLN
INTRAMUSCULAR | Status: DC | PRN
  Administered 2018-04-10 (×3): 50 ug via INTRAVENOUS

## 2018-04-10 MED ORDER — PROPOFOL 10 MG/ML IV BOLUS
INTRAVENOUS | Status: AC
  Filled 2018-04-10: qty 20

## 2018-04-10 MED ORDER — ROCURONIUM BROMIDE 50 MG/5ML IV SOSY
PREFILLED_SYRINGE | INTRAVENOUS | Status: AC
  Filled 2018-04-10: qty 5

## 2018-04-10 MED ORDER — LACTATED RINGERS IV SOLN
INTRAVENOUS | Status: DC | PRN
  Administered 2018-04-10: 07:00:00 via INTRAVENOUS

## 2018-04-10 MED ORDER — FENTANYL CITRATE (PF) 250 MCG/5ML IJ SOLN
INTRAMUSCULAR | Status: AC
  Filled 2018-04-10: qty 5

## 2018-04-10 MED ORDER — LIDOCAINE HCL (CARDIAC) PF 100 MG/5ML IV SOSY
PREFILLED_SYRINGE | INTRAVENOUS | Status: DC | PRN
  Administered 2018-04-10: 100 mg via INTRAVENOUS

## 2018-04-10 MED ORDER — FENTANYL CITRATE (PF) 100 MCG/2ML IJ SOLN: 25.0000 ug | INTRAMUSCULAR | Status: DC | PRN

## 2018-04-10 MED ORDER — PROPOFOL 10 MG/ML IV BOLUS
INTRAVENOUS | Status: DC | PRN
  Administered 2018-04-10: 100 mg via INTRAVENOUS

## 2018-04-10 MED ORDER — DEXAMETHASONE SODIUM PHOSPHATE 10 MG/ML IJ SOLN
INTRAMUSCULAR | Status: DC | PRN
  Administered 2018-04-10: 10 mg via INTRAVENOUS

## 2018-04-10 MED ORDER — EPHEDRINE SULFATE 50 MG/ML IJ SOLN
INTRAMUSCULAR | Status: DC | PRN
  Administered 2018-04-10: 15 mg via INTRAVENOUS

## 2018-04-10 MED ORDER — ONDANSETRON HCL 4 MG/2ML IJ SOLN
INTRAMUSCULAR | Status: DC | PRN
  Administered 2018-04-10: 4 mg via INTRAVENOUS

## 2018-04-10 MED ORDER — 0.9 % SODIUM CHLORIDE (POUR BTL) OPTIME
TOPICAL | Status: DC | PRN
  Administered 2018-04-10: 1000 mL

## 2018-04-10 SURGICAL SUPPLY — 38 items
APL SKNCLS STERI-STRIP NONHPOA (GAUZE/BANDAGES/DRESSINGS) ×4
BENZOIN TINCTURE PRP APPL 2/3 (GAUZE/BANDAGES/DRESSINGS) ×8 IMPLANT
BLADE SAW RECIP 87.9 MT (BLADE) ×2 IMPLANT
BLADE SURG 21 STRL SS (BLADE) ×2 IMPLANT
BNDG COHESIVE 6X5 TAN STRL LF (GAUZE/BANDAGES/DRESSINGS) ×4 IMPLANT
BNDG GAUZE ELAST 4 BULKY (GAUZE/BANDAGES/DRESSINGS) ×4 IMPLANT
COVER SURGICAL LIGHT HANDLE (MISCELLANEOUS) ×2 IMPLANT
CUFF TOURNIQUET SINGLE 34IN LL (TOURNIQUET CUFF) IMPLANT
CUFF TOURNIQUET SINGLE 44IN (TOURNIQUET CUFF) IMPLANT
DRAPE INCISE IOBAN 66X45 STRL (DRAPES) IMPLANT
DRAPE U-SHAPE 47X51 STRL (DRAPES) ×2 IMPLANT
DRESSING PREVENA PLUS CUSTOM (GAUZE/BANDAGES/DRESSINGS) ×1 IMPLANT
DRSG PREVENA PLUS CUSTOM (GAUZE/BANDAGES/DRESSINGS) ×4
DURAPREP 26ML APPLICATOR (WOUND CARE) ×2 IMPLANT
ELECT REM PT RETURN 9FT ADLT (ELECTROSURGICAL) ×2
ELECTRODE REM PT RTRN 9FT ADLT (ELECTROSURGICAL) ×1 IMPLANT
GLOVE BIOGEL PI IND STRL 9 (GLOVE) ×1 IMPLANT
GLOVE BIOGEL PI INDICATOR 9 (GLOVE) ×1
GLOVE SURG ORTHO 9.0 STRL STRW (GLOVE) ×2 IMPLANT
GOWN STRL REUS W/ TWL XL LVL3 (GOWN DISPOSABLE) ×2 IMPLANT
GOWN STRL REUS W/TWL XL LVL3 (GOWN DISPOSABLE) ×4
KIT BASIN OR (CUSTOM PROCEDURE TRAY) ×2 IMPLANT
KIT TURNOVER KIT B (KITS) ×2 IMPLANT
MANIFOLD NEPTUNE II (INSTRUMENTS) ×2 IMPLANT
NS IRRIG 1000ML POUR BTL (IV SOLUTION) ×2 IMPLANT
PACK ORTHO EXTREMITY (CUSTOM PROCEDURE TRAY) ×2 IMPLANT
PAD ARMBOARD 7.5X6 YLW CONV (MISCELLANEOUS) ×2 IMPLANT
PREVENA RESTOR ARTHOFORM 46X30 (CANNISTER) ×1 IMPLANT
SPONGE LAP 18X18 X RAY DECT (DISPOSABLE) IMPLANT
STAPLER VISISTAT 35W (STAPLE) IMPLANT
STOCKINETTE IMPERVIOUS LG (DRAPES) ×2 IMPLANT
SUT SILK 2 0 (SUTURE) ×2
SUT SILK 2-0 18XBRD TIE 12 (SUTURE) ×1 IMPLANT
SUT VIC AB 1 CTX 27 (SUTURE) IMPLANT
TOWEL OR 17X26 10 PK STRL BLUE (TOWEL DISPOSABLE) ×2 IMPLANT
TUBE CONNECTING 12X1/4 (SUCTIONS) ×2 IMPLANT
WND VAC CANISTER 500ML (MISCELLANEOUS) ×1 IMPLANT
YANKAUER SUCT BULB TIP NO VENT (SUCTIONS) ×2 IMPLANT

## 2018-04-10 NOTE — Anesthesia Procedure Notes (Signed)
Procedure Name: LMA Insertion Date/Time: 04/10/2018 7:46 AM Performed by: Rosiland OzMeyers, Hayslee Casebolt, CRNA Pre-anesthesia Checklist: Patient identified, Emergency Drugs available, Suction available, Patient being monitored and Timeout performed Patient Re-evaluated:Patient Re-evaluated prior to induction Oxygen Delivery Method: Circle system utilized Preoxygenation: Pre-oxygenation with 100% oxygen Induction Type: IV induction LMA: LMA inserted LMA Size: 4.0 Number of attempts: 1 Placement Confirmation: positive ETCO2 and breath sounds checked- equal and bilateral Tube secured with: Tape Dental Injury: Teeth and Oropharynx as per pre-operative assessment

## 2018-04-10 NOTE — Transfer of Care (Signed)
Immediate Anesthesia Transfer of Care Note  Patient: Ana Franco  Procedure(s) Performed: LEFT BELOW KNEE AMPUTATION (Left )  Patient Location: PACU  Anesthesia Type:General  Level of Consciousness: awake, drowsy and patient cooperative  Airway & Oxygen Therapy: Patient Spontanous Breathing  Post-op Assessment: Report given to RN and Post -op Vital signs reviewed and stable  Post vital signs: Reviewed and stable  Last Vitals:  Vitals Value Taken Time  BP    Temp    Pulse    Resp    SpO2      Last Pain:  Vitals:   04/10/18 0520  TempSrc: Oral  PainSc:       Patients Stated Pain Goal: 3 (04/08/18 1100)  Complications: No apparent anesthesia complications

## 2018-04-10 NOTE — Op Note (Signed)
   Date of Surgery: 04/10/2018  INDICATIONS: Ms. Ana Franco is a 84 y.o.-year-old female who is status post open reduction internal fixation for ankle fracture as well as follow-up for salvage with debridement and placement of wound VAC.  Despite aggressive limb salvage efforts patient has progressive gangrenous changes medially and presents at this time for transtibial amputation.[name].  PREOPERATIVE DIAGNOSIS: Osteomyelitis abscess gangrene left ankle  POSTOPERATIVE DIAGNOSIS: Same.  PROCEDURE: Transtibial amputation Application of Prevena wound VAC  SURGEON: Lajoyce Cornersuda, M.D.  ANESTHESIA:  general  IV FLUIDS AND URINE: See anesthesia.  ESTIMATED BLOOD LOSS: Minimal minimal mL.  COMPLICATIONS: None.  DESCRIPTION OF PROCEDURE: The patient was brought to the operating room and underwent a general anesthetic. After adequate levels of anesthesia were obtained patient's lower extremity was prepped using DuraPrep draped into a sterile field. A timeout was called. The foot was draped out of the sterile field with impervious stockinette. A transverse incision was made 11 cm distal to the tibial tubercle. This curved proximally and a large posterior flap was created. The tibia was transected 1 cm proximal to the skin incision. The fibula was transected just proximal to the tibial incision. The tibia was beveled anteriorly. A large posterior flap was created. The sciatic nerve was pulled cut and allowed to retract. The vascular bundles were suture ligated with 2-0 silk. The deep and superficial fascial layers were closed using #1 Vicryl. The skin was closed using staples and 2-0 nylon. The wound was covered with a Prevena wound VAC. There was a good suction fit. A prosthetic shrinker was applied. Patient was extubated taken to the PACU in stable condition.   DISCHARGE PLANNING:  Antibiotic duration: 24 hours postoperatively  Weightbearing: Nonweightbearing on the left  Pain medication: Opioid pathway  ordered  Dressing care/ Wound VAC: Continue wound VAC for 2 weeks  Discharge to: Skilled nursing facility could discharge Saturday  Follow-up: In the office 1 week post operative.  [name], MD [facility] 8:28 AM

## 2018-04-10 NOTE — Social Work (Signed)
Pt insurance authorization is pending for return to [facility], there will need to be updated PT/OT notes for pt post surgery.   CSW unable to discharge pt until insurance authorization received.  Ana HutchingIsabel H Abdoulie Franco, LCSWA [facility] Clinical Social Work [phone]

## 2018-04-10 NOTE — Progress Notes (Signed)
PROGRESS NOTE  Ana Franco WUJ:[id number] DOB: July 13, 1926 DOA: 04/02/2018 PCP: Gaspar Garbe, MD  HPI/Brief Narrative  Ana Franco is a 84 y.o. year old female with medical history significant for nonhealing surgical wound status post ORIF of left ankle (02/19/2018) with eventual hardware removal on 03/31/2018, HTN, HLD, A. fib not on anticoagulation, anemia, depression, GERD.[name] Recently here with wound dehiscence from ORIF with exposed hardware and underwent hardware removal on 03/31/2018 by Dr.Timothy Eulah Pont and discharged 04/01/18 on doxycycline for 2 weeks to rehab facility. She was seen at [facility] for follow up on 04/01/18 and found to have  Patient was directly admitted on 8/22 after intraoperative cultures resulted with MRSA resistant to doxycycline and started empirically on vancomycin.  Initial plan was to continue IV vancomycin for additional 6 weeks of therapy (* end date 05/14/18) with follow up with Dr. Orvan Falconer with ID and Dr. Eulah Pont her orthopedic surgeon.  However upon further evaluation by Dr. Lajoyce Corners on 8/26 concern for gangrenous necrotic wound over the medial malleolus with exposed hardware in addition to associated osteomyelitis that would require amputation.  Patient initially declined surgery and wished to return to SNF on 8/26; discuss further with family on 8/27 who now agreeable to pursuing surgery.  Discussed with Dr. Lajoyce Corners and plan for patient be taken to the OR on Friday, 04/10/2018.  Subjective Came from OR. Alert, denies chest pain.    Assessment/Plan: Wound dehiscence/MRSA infection of left ankle(previous ORIF on 02/2018) status post hardware removal 03/31/2018, stable.  No confirmed radiographic evidence of osteomyelitis however given exposed hardware and now concern for development of gangrenous necrosis of wound IV antibiotics alone are not sufficient for therapy.  -awaiting sx  Tomorrow.  -patient underwent transtibial amputation and  application of wound vac on 8-30 by Dr Lajoyce Corners.  -discussed with Dr Lajoyce Corners, patient will need IV antibiotics for 24 hours after sx.   Chronic anemia, stable.  Hb baseline 9--10. Has known iron deficiency anemia.  Monitor hb,  Continue with iron.  Repeat labs in am.   Paroxysmal atrial fibrillation, rate controlled.   Currently normal sinus rhythm.  Not on anticoagulation due to fall risk.  Hypertension, improving currently. Continue with amlodipine and metoprolol.  Mild increase cr at 1.25. Hold cozaar.  PRN hydralazine.   CKD, stage III, creatinine stable at baseline.   Mild increase cr at 1.2. Hold cozaar.  Stable.   Hypokalemia resolved./   Code Status: discussed code status with patient, son at bedside. Patient wishes to be DNR. thye understand that Code status is not follow during OR>    Family Communication: discussed with son  Disposition Plan: plan fo for amputation on 04/10/2018.  Consultants:   Orthopedics, Infectious Disease  Procedures:  none   Antimicrobials:  Vancomycin 8-22  Cultures:  none  Telemetry: no  DVT prophylaxis: SCDs   Objective: Vitals:   04/10/18 0900 04/10/18 0915 04/10/18 0930 04/10/18 1031  BP: 140/66 (!) 156/60 (!) 155/65 (!) 156/63  Pulse: 74 77 75 64  Resp: 16 16 16 18   Temp:   98.1 F (36.7 C) 98.5 F (36.9 C)  TempSrc:    Oral  SpO2: 99% 96% 97% 97%  Weight:      Height:        Intake/Output Summary (Last 24 hours) at 04/10/2018 1337 Last data filed at 04/10/2018 0824 Gross per 24 hour  Intake 686.48 ml  Output 322 ml  Net 364.48 ml   [employer]  04/02/18 1831  Weight: 95.8 kg    Exam:  General; NAD CVS; S 1, S 2 RRR LE; S/P transtibial amputation, wound vac in place.  Abdomen; soft, nt, nd Neuro; alert    Data Reviewed: CBC: Recent Labs  Lab 04/04/18 0911 04/08/18 0400 04/09/18 0444 04/10/18 0400  WBC 11.9* 10.2 9.9 8.9  NEUTROABS 5.8  --   --   --   HGB 10.5* 8.6* 8.5* 8.5*  HCT 33.6*  27.5* 28.1* 27.5*  MCV 94.6 95.5 95.9 96.2  PLT 322 230 246 236   Basic Metabolic Panel: Recent Labs  Lab 04/04/18 0911 04/08/18 0400 04/09/18 0444 04/10/18 0400  NA 138 139 138 137  K 3.8 3.3* 3.9 4.2  CL 104 104 103 103  CO2 26 27 26 27   GLUCOSE 112* 99 101* 106*  BUN 9 13 16 16   CREATININE 0.74 1.25* 1.29* 1.27*  CALCIUM 8.8* 8.4* 8.4* 8.5*   GFR: Estimated Creatinine Clearance: 34.3 mL/min (A) (by C-G formula based on SCr of 1.27 mg/dL (H)). Liver Function Tests: Recent Labs  Lab 04/04/18 0911  AST 33  ALT 10  ALKPHOS 89  BILITOT 0.7  PROT 6.0*  ALBUMIN 2.7*   No results for input(s): LIPASE, AMYLASE in the last 168 hours. No results for input(s): AMMONIA in the last 168 hours. Coagulation Profile: No results for input(s): INR, PROTIME in the last 168 hours. Cardiac Enzymes: No results for input(s): CKTOTAL, CKMB, CKMBINDEX, TROPONINI in the last 168 hours. BNP (last 3 results) No results for input(s): PROBNP in the last 8760 hours. HbA1C: No results for input(s): HGBA1C in the last 72 hours. CBG: No results for input(s): GLUCAP in the last 168 hours. Lipid Profile: No results for input(s): CHOL, HDL, LDLCALC, TRIG, CHOLHDL, LDLDIRECT in the last 72 hours. Thyroid Function Tests: No results for input(s): TSH, T4TOTAL, FREET4, T3FREE, THYROIDAB in the last 72 hours. Anemia Panel: No results for input(s): VITAMINB12, FOLATE, FERRITIN, TIBC, IRON, RETICCTPCT in the last 72 hours. Urine analysis:    Component Value Date/Time   COLORURINE YELLOW 06/13/2014 0018   APPEARANCEUR CLOUDY (A) 06/13/2014 0018   LABSPEC 1.018 06/13/2014 0018   PHURINE 5.0 06/13/2014 0018   GLUCOSEU NEGATIVE 06/13/2014 0018   HGBUR MODERATE (A) 06/13/2014 0018   BILIRUBINUR NEGATIVE 06/13/2014 0018   KETONESUR NEGATIVE 06/13/2014 0018   PROTEINUR NEGATIVE 06/13/2014 0018   UROBILINOGEN 0.2 06/13/2014 0018   NITRITE POSITIVE (A) 06/13/2014 0018   LEUKOCYTESUR LARGE (A) 06/13/2014  0018   Sepsis Labs: @LABRCNTIP (procalcitonin:4,lacticidven:4)  ) Recent Results (from the past 240 hour(s))  Surgical pcr screen     Status: None   Collection Time: 04/09/18  3:41 PM  Result Value Ref Range Status   MRSA, PCR NEGATIVE NEGATIVE Final   Staphylococcus aureus NEGATIVE NEGATIVE Final    Comment: (NOTE) The Xpert SA Assay (FDA approved for NASAL specimens in patients 82 years of age and older), is one component of a comprehensive surveillance program. It is not intended to diagnose infection nor to guide or monitor treatment. Performed at [facility], [address] [place], [place] [address]       Studies: No results found.  Scheduled Meds: . amLODipine  10 mg Oral Daily  . aspirin EC  325 mg Oral Daily  . citalopram  20 mg Oral Daily  . ezetimibe-simvastatin  1 tablet Oral QHS  . ferrous sulfate  325 mg Oral BID WC  . metoprolol tartrate  100 mg Oral  Daily  . metoprolol tartrate  25 mg Oral QPM  . pantoprazole  40 mg Oral Daily  . polyethylene glycol  17 g Oral Daily    Continuous Infusions: . vancomycin 1,250 mg (04/10/18 0527)     LOS: 8 days     Alba Cory, MD [facility] Pager [phone]  If 7PM-7AM, please contact night-coverage [email or web address] Password [email or web address] 04/10/2018, 1:37 PM

## 2018-04-10 NOTE — Interval H&P Note (Signed)
History and Physical Interval Note:  04/10/2018 7:18 AM  Ana Franco  has presented today for surgery, with the diagnosis of Gangrene/Osteomyelitis Left Ankle  The various methods of treatment have been discussed with the patient and family. After consideration of risks, benefits and other options for treatment, the patient has consented to  Procedure(s): LEFT BELOW KNEE AMPUTATION (Left) as a surgical intervention .  The patient's history has been reviewed, patient examined, no change in status, stable for surgery.  I have reviewed the patient's chart and labs.  Questions were answered to the patient's satisfaction.     Nadara MustardMarcus V Duda

## 2018-04-10 NOTE — Progress Notes (Signed)
Pt back from OR s/p left BKA  Warm to touch and can move extremity with wound vac at 125, alert and oriented, no complain of pain.

## 2018-04-10 NOTE — Anesthesia Preprocedure Evaluation (Signed)
Anesthesia Evaluation  Patient identified by MRN, date of birth, ID band Patient awake    Reviewed: Allergy & Precautions, NPO status , Patient's Chart, lab work & pertinent test results  Airway Mallampati: II  TM Distance: >3 FB Neck ROM: Full    Dental no notable dental hx.    Pulmonary neg pulmonary ROS,    Pulmonary exam normal breath sounds clear to auscultation       Cardiovascular hypertension, + Past MI and + Peripheral Vascular Disease  Normal cardiovascular exam Rhythm:Regular Rate:Normal     Neuro/Psych negative neurological ROS  negative psych ROS   GI/Hepatic Neg liver ROS, GERD  ,  Endo/Other  negative endocrine ROS  Renal/GU negative Renal ROS  negative genitourinary   Musculoskeletal negative musculoskeletal ROS (+)   Abdominal   Peds negative pediatric ROS (+)  Hematology  (+) anemia ,   Anesthesia Other Findings   Reproductive/Obstetrics negative OB ROS                             Anesthesia Physical Anesthesia Plan  ASA: III  Anesthesia Plan: General   Post-op Pain Management:    Induction: Intravenous  PONV Risk Score and Plan: 2 and Ondansetron, Dexamethasone and Treatment may vary due to age or medical condition  Airway Management Planned: LMA and Oral ETT  Additional Equipment:   Intra-op Plan:   Post-operative Plan: Extubation in OR  Informed Consent: I have reviewed the patients History and Physical, chart, labs and discussed the procedure including the risks, benefits and alternatives for the proposed anesthesia with the patient or authorized representative who has indicated his/her understanding and acceptance.   Dental advisory given  Plan Discussed with: CRNA and Surgeon  Anesthesia Plan Comments:         Anesthesia Quick Evaluation

## 2018-04-10 NOTE — Anesthesia Postprocedure Evaluation (Signed)
Anesthesia Post Note  Patient: Ana Franco  Procedure(s) Performed: LEFT BELOW KNEE AMPUTATION (Left )     Patient location during evaluation: PACU Anesthesia Type: General Level of consciousness: awake and alert Pain management: pain level controlled Vital Signs Assessment: post-procedure vital signs reviewed and stable Respiratory status: spontaneous breathing, nonlabored ventilation, respiratory function stable and patient connected to nasal cannula oxygen Cardiovascular status: blood pressure returned to baseline and stable Postop Assessment: no apparent nausea or vomiting Anesthetic complications: no    Last Vitals:  Vitals:   04/10/18 0900 04/10/18 0915  BP: 140/66 (!) 156/60  Pulse: 74 77  Resp: 16 16  Temp:    SpO2: 99% 96%    Last Pain:  Vitals:   04/10/18 0900  TempSrc:   PainSc: 0-No pain                 Maelani Yarbro S

## 2018-04-11 ENCOUNTER — Encounter (HOSPITAL_COMMUNITY): Payer: Self-pay | Admitting: Orthopedic Surgery

## 2018-04-11 LAB — CBC
HCT: 25.4 % — ABNORMAL LOW (ref 36.0–46.0)
Hemoglobin: 7.8 g/dL — ABNORMAL LOW (ref 12.0–15.0)
MCH: 29.5 pg (ref 26.0–34.0)
MCHC: 30.7 g/dL (ref 30.0–36.0)
MCV: 96.2 fL (ref 78.0–100.0)
Platelets: 236 10*3/uL (ref 150–400)
RBC: 2.64 MIL/uL — ABNORMAL LOW (ref 3.87–5.11)
RDW: 14.2 % (ref 11.5–15.5)
WBC: 12.4 10*3/uL — ABNORMAL HIGH (ref 4.0–10.5)

## 2018-04-11 LAB — PREPARE RBC (CROSSMATCH)

## 2018-04-11 MED ORDER — METHOCARBAMOL 1000 MG/10ML IJ SOLN
500.0000 mg | Freq: Four times a day (QID) | INTRAVENOUS | Status: DC | PRN
  Filled 2018-04-11: qty 5

## 2018-04-11 MED ORDER — SODIUM CHLORIDE 0.9 % IV SOLN
INTRAVENOUS | Status: DC
  Administered 2018-04-11 – 2018-04-12 (×2): via INTRAVENOUS

## 2018-04-11 MED ORDER — OXYCODONE HCL 5 MG PO TABS: 10.0000 mg | ORAL_TABLET | ORAL | Status: DC | PRN

## 2018-04-11 MED ORDER — POLYETHYLENE GLYCOL 3350 17 G PO PACK: 17.0000 g | PACK | Freq: Every day | ORAL | Status: DC | PRN

## 2018-04-11 MED ORDER — HYDROMORPHONE HCL 1 MG/ML IJ SOLN: 0.5000 mg | INTRAMUSCULAR | Status: DC | PRN

## 2018-04-11 MED ORDER — METOCLOPRAMIDE HCL 5 MG PO TABS: 5.0000 mg | ORAL_TABLET | Freq: Three times a day (TID) | ORAL | Status: DC | PRN

## 2018-04-11 MED ORDER — FUROSEMIDE 10 MG/ML IJ SOLN
20.0000 mg | Freq: Once | INTRAMUSCULAR | Status: AC
  Administered 2018-04-11: 20 mg via INTRAVENOUS
  Filled 2018-04-11: qty 2

## 2018-04-11 MED ORDER — METOCLOPRAMIDE HCL 5 MG/ML IJ SOLN: 5.0000 mg | Freq: Three times a day (TID) | INTRAMUSCULAR | Status: DC | PRN

## 2018-04-11 MED ORDER — SODIUM CHLORIDE 0.9% IV SOLUTION
Freq: Once | INTRAVENOUS | Status: AC
  Administered 2018-04-11: 16:00:00 via INTRAVENOUS

## 2018-04-11 MED ORDER — OXYCODONE HCL 5 MG PO TABS: 5.0000 mg | ORAL_TABLET | ORAL | Status: DC | PRN

## 2018-04-11 MED ORDER — ONDANSETRON HCL 4 MG PO TABS: 4.0000 mg | ORAL_TABLET | Freq: Four times a day (QID) | ORAL | Status: DC | PRN

## 2018-04-11 MED ORDER — METHOCARBAMOL 500 MG PO TABS: 500.0000 mg | ORAL_TABLET | Freq: Four times a day (QID) | ORAL | Status: DC | PRN

## 2018-04-11 MED ORDER — BISACODYL 10 MG RE SUPP
10.0000 mg | Freq: Every day | RECTAL | Status: DC | PRN
  Administered 2018-04-13: 10 mg via RECTAL
  Filled 2018-04-11: qty 1

## 2018-04-11 MED ORDER — ACETAMINOPHEN 325 MG PO TABS: 325.0000 mg | ORAL_TABLET | Freq: Four times a day (QID) | ORAL | Status: DC | PRN

## 2018-04-11 MED ORDER — MAGNESIUM CITRATE PO SOLN
1.0000 | Freq: Once | ORAL | Status: AC | PRN
  Administered 2018-04-13: 1 via ORAL
  Filled 2018-04-11: qty 296

## 2018-04-11 MED ORDER — DOCUSATE SODIUM 100 MG PO CAPS
100.0000 mg | ORAL_CAPSULE | Freq: Two times a day (BID) | ORAL | Status: DC
  Administered 2018-04-11 – 2018-04-14 (×7): 100 mg via ORAL
  Filled 2018-04-11 (×7): qty 1

## 2018-04-11 MED ORDER — ONDANSETRON HCL 4 MG/2ML IJ SOLN
4.0000 mg | Freq: Four times a day (QID) | INTRAMUSCULAR | Status: DC | PRN
  Administered 2018-04-13: 4 mg via INTRAVENOUS
  Filled 2018-04-11: qty 2

## 2018-04-11 NOTE — Progress Notes (Signed)
[facility] has not received insurance authorization yet. Will send PT notes once they have seen patient today.   Osborne Cascoadia Jamale Spangler LCSW [phone]

## 2018-04-11 NOTE — Progress Notes (Signed)
Physical Therapy Treatment Patient Details Name: Ana Franco MRN: [id number] DOB: 09-13-1925 Today's Date: 04/11/2018    History of Present Illness Pt is a 84 y/o female s/p L transtibial amputation on 8/30. PMH including but not limited to MI (02/19/18), HTN and HLD.    PT Comments    Pt seen following her surgery on 8/30 for re-evaluation. Pt sitting OOB in recliner chair upon arrival, reporting that the nursing staff had just recently gotten her OOB and to the chair with use of the STEDY. Pt very limited this session secondary to pain and fatigue. PT began going through amputee HEP with pt (see below) to pt's tolerance. Plan is still for pt to d/c to [facility] facility for further therapy services.   Pt would continue to benefit from skilled physical therapy services at this time while admitted and after d/c to address the below listed limitations in order to improve overall safety and independence with functional mobility.    Follow Up Recommendations  SNF     Equipment Recommendations  None recommended by PT    Recommendations for Other Services       Precautions / Restrictions Precautions Precautions: Fall Precaution Comments: wound VAC Restrictions Weight Bearing Restrictions: Yes LLE Weight Bearing: Non weight bearing    Mobility  Bed Mobility               General bed mobility comments: OOB in recliner chair upon arrival. Pt reported that two staff members assisted her OOB with use of STEDY  Transfers                 General transfer comment: STEDY used for transfer from bed to recliner chair by nursing staff just prior to PT arrival  Ambulation/Gait                 Stairs             Wheelchair Mobility    Modified Rankin (Stroke Patients Only)       Balance Overall balance assessment: Needs assistance Sitting-balance support: Bilateral upper extremity supported Sitting balance-Leahy Scale: Poor Sitting balance -  Comments: pt reliant on UE supports when no back support available Postural control: Posterior lean                                  Cognition Arousal/Alertness: Awake/alert Behavior During Therapy: WFL for tasks assessed/performed Overall Cognitive Status: Within Functional Limits for tasks assessed                                        Exercises Amputee Exercises Quad Sets: AROM;Strengthening;Left;10 reps;Seated Hip ABduction/ADduction: AROM;Strengthening;Left;10 reps;Seated Straight Leg Raises: AROM;Strengthening;Left;10 reps;Seated Chair Push Up: AROM;10 reps;Seated;Both Other Exercises Other Exercises: PT demonstrated and instructed pt on gentle tactile stimulation to L residual limb    General Comments        Pertinent Vitals/Pain Pain Assessment: Faces Faces Pain Scale: Hurts little more Pain Location: L residual limb Pain Descriptors / Indicators: Sore Pain Intervention(s): Monitored during session;Repositioned    Home Living                      Prior Function            PT Goals (current goals can now be found in the care plan  section) Acute Rehab PT Goals PT Goal Formulation: With patient Time For Goal Achievement: 04/25/18 Potential to Achieve Goals: Good Progress towards PT goals: Progressing toward goals    Frequency    Min 3X/week      PT Plan Current plan remains appropriate    Co-evaluation              AM-PAC PT "6 Clicks" Daily Activity  Outcome Measure  Difficulty turning over in bed (including adjusting bedclothes, sheets and blankets)?: Unable Difficulty moving from lying on back to sitting on the side of the bed? : Unable Difficulty sitting down on and standing up from a chair with arms (e.g., wheelchair, bedside commode, etc,.)?: Unable Help needed moving to and from a bed to chair (including a wheelchair)?: Total Help needed walking in hospital room?: Total Help needed climbing 3-5  steps with a railing? : Total 6 Click Score: 6    End of Session   Activity Tolerance: Patient tolerated treatment well Patient left: in chair;with call bell/phone within reach Nurse Communication: Mobility status PT Visit Diagnosis: Other abnormalities of gait and mobility (R26.89);Pain Pain - Right/Left: Left Pain - part of body: Leg     Time: [id number] PT Time Calculation (min) (ACUTE ONLY): 18 min  Charges:                        Deborah ChalkJennifer Hydia Copelin, PT, DPT  Acute Rehabilitation Services Pager [phone] Office [phone]     Alessandra BevelsJennifer M Ellanore Vanhook 04/11/2018, 4:15 PM

## 2018-04-11 NOTE — Progress Notes (Signed)
PROGRESS NOTE  Ana Franco ZOX:[id number] DOB: Jun 28, 1926 DOA: 04/02/2018 PCP: Gaspar Garbe, MD  HPI/Brief Narrative  Ana Franco is a 84 y.o. year old female with medical history significant for nonhealing surgical wound status post ORIF of left ankle (02/19/2018) with eventual hardware removal on 03/31/2018, HTN, HLD, A. fib not on anticoagulation, anemia, depression, GERD.[name] Recently here with wound dehiscence from ORIF with exposed hardware and underwent hardware removal on 03/31/2018 by Dr.Timothy Eulah Pont and discharged 04/01/18 on doxycycline for 2 weeks to rehab facility. She was seen at [facility] for follow up on 04/01/18 and found to have  Patient was directly admitted on 8/22 after intraoperative cultures resulted with MRSA resistant to doxycycline and started empirically on vancomycin.  Initial plan was to continue IV vancomycin for additional 6 weeks of therapy (* end date 05/14/18) with follow up with Dr. Orvan Falconer with ID and Dr. Eulah Pont her orthopedic surgeon.  However upon further evaluation by Dr. Lajoyce Corners on 8/26 concern for gangrenous necrotic wound over the medial malleolus with exposed hardware in addition to associated osteomyelitis that would require amputation.  Patient initially declined surgery and wished to return to SNF on 8/26; discuss further with family on 8/27 who now agreeable to pursuing surgery.  Discussed with Dr. Lajoyce Corners and plan for patient be taken to the OR on Friday, 04/10/2018.  Subjective Feeling well.     Assessment/Plan: Wound dehiscence/MRSA infection of left ankle(previous ORIF on 02/2018) status post hardware removal 03/31/2018, stable.  No confirmed radiographic evidence of osteomyelitis however given exposed hardware and now concern for development of gangrenous necrosis of wound IV antibiotics alone are not sufficient for therapy.  -awaiting sx  Tomorrow.  -patient underwent transtibial amputation and application of wound vac on 8-30  by Dr Lajoyce Corners.  -discussed with Dr Lajoyce Corners, patient will need IV antibiotics for 24 hours after sx.   Chronic anemia, stable.  Acute blood loss anemia.  Hb baseline 9--10. Has known iron deficiency anemia.  Monitor hb,  Continue with iron.  Transfuse one unit PRBC>    Paroxysmal atrial fibrillation, rate controlled.   Currently normal sinus rhythm.  Not on anticoagulation due to fall risk.  Hypertension, improving currently. Continue with amlodipine and metoprolol.  Mild increase cr at 1.25. Hold cozaar.  PRN hydralazine.   CKD, stage III, creatinine stable at baseline.   Mild increase cr at 1.2. Hold cozaar.  Stable.   Hypokalemia resolved./   Code Status: discussed code status with patient, son at bedside. Patient wishes to be DNR. thye understand that Code status is not follow during OR>    Family Communication: discussed with son  Disposition Plan: plan fo for amputation on 04/10/2018.  Consultants:   Orthopedics, Infectious Disease  Procedures:  none   Antimicrobials:  Vancomycin 8-22  Cultures:  none  Telemetry: no  DVT prophylaxis: SCDs   Objective: Vitals:   04/11/18 0934 04/11/18 1341 04/11/18 1616 04/11/18 1650  BP: 118/60 125/67 (!) 122/48 (!) 121/55  Pulse: 69 60 60 61  Resp: 15 17    Temp: 97.8 F (36.6 C) 97.6 F (36.4 C) 98 F (36.7 C) 98.6 F (37 C)  TempSrc: Oral Oral Oral Oral  SpO2: 97% 93% 97% 98%  Weight:      Height:        Intake/Output Summary (Last 24 hours) at 04/11/2018 1818 Last data filed at 04/11/2018 1640 Gross per 24 hour  Intake 796.46 ml  Output 650 ml  Net 146.46  ml   Filed Weights   04/02/18 1831  Weight: 95.8 kg    Exam:  General; NAD CVS; S ,1 S 2 RRR LE; S/P transtibial amputation, wound vac in place Abdomen; Soft nt Neuro; alert    Data Reviewed: CBC: Recent Labs  Lab 04/08/18 0400 04/09/18 0444 04/10/18 0400 04/11/18 0821  WBC 10.2 9.9 8.9 12.4*  HGB 8.6* 8.5* 8.5* 7.8*  HCT 27.5*  28.1* 27.5* 25.4*  MCV 95.5 95.9 96.2 96.2  PLT 230 246 236 236   Basic Metabolic Panel: Recent Labs  Lab 04/08/18 0400 04/09/18 0444 04/10/18 0400  NA 139 138 137  K 3.3* 3.9 4.2  CL 104 103 103  CO2 27 26 27   GLUCOSE 99 101* 106*  BUN 13 16 16   CREATININE 1.25* 1.29* 1.27*  CALCIUM 8.4* 8.4* 8.5*   GFR: Estimated Creatinine Clearance: 34.3 mL/min (A) (by C-G formula based on SCr of 1.27 mg/dL (H)). Liver Function Tests: No results for input(s): AST, ALT, ALKPHOS, BILITOT, PROT, ALBUMIN in the last 168 hours. No results for input(s): LIPASE, AMYLASE in the last 168 hours. No results for input(s): AMMONIA in the last 168 hours. Coagulation Profile: No results for input(s): INR, PROTIME in the last 168 hours. Cardiac Enzymes: No results for input(s): CKTOTAL, CKMB, CKMBINDEX, TROPONINI in the last 168 hours. BNP (last 3 results) No results for input(s): PROBNP in the last 8760 hours. HbA1C: No results for input(s): HGBA1C in the last 72 hours. CBG: No results for input(s): GLUCAP in the last 168 hours. Lipid Profile: No results for input(s): CHOL, HDL, LDLCALC, TRIG, CHOLHDL, LDLDIRECT in the last 72 hours. Thyroid Function Tests: No results for input(s): TSH, T4TOTAL, FREET4, T3FREE, THYROIDAB in the last 72 hours. Anemia Panel: No results for input(s): VITAMINB12, FOLATE, FERRITIN, TIBC, IRON, RETICCTPCT in the last 72 hours. Urine analysis:    Component Value Date/Time   COLORURINE YELLOW 06/13/2014 0018   APPEARANCEUR CLOUDY (A) 06/13/2014 0018   LABSPEC 1.018 06/13/2014 0018   PHURINE 5.0 06/13/2014 0018   GLUCOSEU NEGATIVE 06/13/2014 0018   HGBUR MODERATE (A) 06/13/2014 0018   BILIRUBINUR NEGATIVE 06/13/2014 0018   KETONESUR NEGATIVE 06/13/2014 0018   PROTEINUR NEGATIVE 06/13/2014 0018   UROBILINOGEN 0.2 06/13/2014 0018   NITRITE POSITIVE (A) 06/13/2014 0018   LEUKOCYTESUR LARGE (A) 06/13/2014 0018   Sepsis  Labs: @LABRCNTIP (procalcitonin:4,lacticidven:4)  ) Recent Results (from the past 240 hour(s))  Surgical pcr screen     Status: None   Collection Time: 04/09/18  3:41 PM  Result Value Ref Range Status   MRSA, PCR NEGATIVE NEGATIVE Final   Staphylococcus aureus NEGATIVE NEGATIVE Final    Comment: (NOTE) The Xpert SA Assay (FDA approved for NASAL specimens in patients 82 years of age and older), is one component of a comprehensive surveillance program. It is not intended to diagnose infection nor to guide or monitor treatment. Performed at [facility], [address] [place], [place] [address]       Studies: No results found.  Scheduled Meds: . amLODipine  10 mg Oral Daily  . aspirin EC  325 mg Oral Daily  . citalopram  20 mg Oral Daily  . docusate sodium  100 mg Oral BID  . ezetimibe-simvastatin  1 tablet Oral QHS  . ferrous sulfate  325 mg Oral BID WC  . furosemide  20 mg Intravenous Once  . metoprolol tartrate  100 mg Oral Daily  . metoprolol tartrate  25 mg Oral QPM  .  pantoprazole  40 mg Oral Daily  . polyethylene glycol  17 g Oral Daily    Continuous Infusions: . sodium chloride 10 mL/hr at 04/11/18 1640  . methocarbamol (ROBAXIN) IV       LOS: 9 days     Alba Cory, MD [facility] Pager [phone]  If 7PM-7AM, please contact night-coverage [email or web address] Password [email or web address] 04/11/2018, 6:18 PM

## 2018-04-11 NOTE — Progress Notes (Signed)
     Subjective: 1 Day Post-Op Procedure(s) (LRB): LEFT BELOW KNEE AMPUTATION (Left) Awake, alert and oriented x 4. Left BKA transtibia. In good spirits. Do you think I can eventually have a prosthesis. Active [place], severely comminuted left distal tibia fibula fracture with osteoporotic bone. Developed nonunion and loosening of hardware with osteomyelitisand gangrene. Patient reports pain as mild.    Objective:   VITALS:  Temp:  [97.8 F (36.6 C)-98.5 F (36.9 C)] 97.8 F (36.6 C) (08/31 0934) Pulse Rate:  [64-88] 69 (08/31 0934) Resp:  [15-18] 15 (08/31 0934) BP: (118-137)/(56-63) 118/60 (08/31 0934) SpO2:  [94 %-98 %] 97 % (08/31 0934)  Neurologically intact ABD soft Neurovascular intact Sensation intact distally Intact pulses distally Dorsiflexion/Plantar flexion intact Incision: dressing C/D/I and scant drainage Compartment soft   LABS Recent Labs    04/09/18 0444 04/10/18 0400 04/11/18 0821  HGB 8.5* 8.5* 7.8*  WBC 9.9 8.9 12.4*  PLT 246 236 236   Recent Labs    04/09/18 0444 04/10/18 0400  NA 138 137  K 3.9 4.2  CL 103 103  CO2 26 27  BUN 16 16  CREATININE 1.29* 1.27*  GLUCOSE 101* 106*   No results for input(s): LABPT, INR in the last 72 hours.   Assessment/Plan: 1 Day Post-Op Procedure(s) (LRB): LEFT BELOW KNEE AMPUTATION (Left)  Advance diet Up with therapy Continue ABX therapy due to infection Discharge to SNF  Vira BrownsJames Anthonio Mizzell 04/11/2018, 1:10 PMPatient ID: Ana Sanesorothy Herrero, female   DOB: 24-May-1926, 84 y.o.   MRN: [id number]

## 2018-04-12 LAB — CBC
HCT: 28.3 % — ABNORMAL LOW (ref 36.0–46.0)
Hemoglobin: 8.6 g/dL — ABNORMAL LOW (ref 12.0–15.0)
MCH: 29.2 pg (ref 26.0–34.0)
MCHC: 30.4 g/dL (ref 30.0–36.0)
MCV: 95.9 fL (ref 78.0–100.0)
Platelets: 223 10*3/uL (ref 150–400)
RBC: 2.95 MIL/uL — ABNORMAL LOW (ref 3.87–5.11)
RDW: 14.4 % (ref 11.5–15.5)
WBC: 13.2 10*3/uL — ABNORMAL HIGH (ref 4.0–10.5)

## 2018-04-12 LAB — TYPE AND SCREEN
ABO/RH(D): B POS
Antibody Screen: NEGATIVE
Unit division: 0

## 2018-04-12 LAB — BPAM RBC
Blood Product Expiration Date: 201909272359
ISSUE DATE / TIME: 201908311627
Unit Type and Rh: 7300

## 2018-04-12 MED ORDER — VANCOMYCIN HCL 10 G IV SOLR
1250.0000 mg | INTRAVENOUS | Status: DC
  Administered 2018-04-12 – 2018-04-13 (×2): 1250 mg via INTRAVENOUS
  Filled 2018-04-12 (×2): qty 1250

## 2018-04-12 MED ORDER — PANTOPRAZOLE SODIUM 40 MG PO TBEC
40.0000 mg | DELAYED_RELEASE_TABLET | Freq: Two times a day (BID) | ORAL | Status: DC
  Administered 2018-04-12 – 2018-04-14 (×4): 40 mg via ORAL
  Filled 2018-04-12 (×4): qty 1

## 2018-04-12 NOTE — Progress Notes (Signed)
PROGRESS NOTE  Ana Franco UJW:[id number] DOB: 08-10-1926 DOA: 04/02/2018 PCP: Gaspar Garbe, MD  HPI/Brief Narrative  Ana Franco is a 84 y.o. year old female with medical history significant for nonhealing surgical wound status post ORIF of left ankle (02/19/2018) with eventual hardware removal on 03/31/2018, HTN, HLD, A. fib not on anticoagulation, anemia, depression, GERD.[name] Recently here with wound dehiscence from ORIF with exposed hardware and underwent hardware removal on 03/31/2018 by Dr.Timothy Eulah Pont and discharged 04/01/18 on doxycycline for 2 weeks to rehab facility. She was seen at [facility] for follow up on 04/01/18 and found to have  Patient was directly admitted on 8/22 after intraoperative cultures resulted with MRSA resistant to doxycycline and started empirically on vancomycin.  Initial plan was to continue IV vancomycin for additional 6 weeks of therapy (* end date 05/14/18) with follow up with Dr. Orvan Falconer with ID and Dr. Eulah Pont her orthopedic surgeon.  However upon further evaluation by Dr. Lajoyce Corners on 8/26 concern for gangrenous necrotic wound over the medial malleolus with exposed hardware in addition to associated osteomyelitis that would require amputation.  Patient initially declined surgery and wished to return to SNF on 8/26; discuss further with family on 8/27 who now agreeable to pursuing surgery.  Discussed with Dr. Lajoyce Corners and plan for patient be taken to the OR on Friday, 04/10/2018.  Subjective She report pain is better. She hasn't required pain meds today.  Report reflux     Assessment/Plan: Wound dehiscence/MRSA infection of left ankle(previous ORIF on 02/2018) status post hardware removal 03/31/2018, stable.  No confirmed radiographic evidence of osteomyelitis however given exposed hardware and now concern for development of gangrenous necrosis of wound IV antibiotics alone are not sufficient for therapy.  -awaiting sx  Tomorrow.  -patient  underwent transtibial amputation and application of wound vac on 8-30 by Dr Lajoyce Corners.  -discussed with Dr Lajoyce Corners, patient will need IV antibiotics for 24 hours after sx.  -WBC increasing, Dr Bertram Millard recommends to continue with Antibiotics. Resume vanc. Follow pathology sample. Will discussed with Dr Lajoyce Corners regarding antibiotics.   Chronic anemia, stable.  Acute blood loss anemia.  Hb baseline 9--10. Has known iron deficiency anemia.  Monitor hb,  Continue with iron.  S/P one PRBC> 8-31 hb improved.   Paroxysmal atrial fibrillation, rate controlled.   Currently normal sinus rhythm.  Not on anticoagulation due to fall risk.  Hypertension, improving currently. Continue with amlodipine and metoprolol.  Mild increase cr at 1.25. Hold cozaar.  PRN hydralazine.   CKD, stage III, creatinine stable at baseline.   Mild increase cr at 1.2. Hold cozaar.  Stable.   Hypokalemia resolved./   Code Status: discussed code status with patient, son at bedside. Patient wishes to be DNR. thye understand that Code status is not follow during OR>    Family Communication: discussed with son  Disposition Plan: plan fo for amputation on 04/10/2018.  Consultants:   Orthopedics, Infectious Disease  Procedures:  none   Antimicrobials:  Vancomycin 8-22  Cultures:  none  Telemetry: no  DVT prophylaxis: SCDs   Objective: Vitals:   04/11/18 1812 04/11/18 1916 04/12/18 0156 04/12/18 0600  BP: (!) 122/52 (!) 122/49 (!) 148/53 (!) 142/70  Pulse: (!) 59 (!) 59 (!) 59 (!) 56  Resp: 16 17 17 17   Temp: 98.4 F (36.9 C) 98 F (36.7 C) 98.4 F (36.9 C) 97.6 F (36.4 C)  TempSrc: Oral Oral Oral Oral  SpO2: 97% 98% 97% 95%  Weight:  Height:        Intake/Output Summary (Last 24 hours) at 04/12/2018 1052 Last data filed at 04/12/2018 1019 Gross per 24 hour  Intake 1407.17 ml  Output 2575 ml  Net -1167.83 ml   Filed Weights   04/02/18 1831  Weight: 95.8 kg    Exam:  General; NAD CVS; S 1,  S 2RRR LE; S/P transtibial amputation, wound vac in place Abdomen; Soft, nt, nd Neuro; alert    Data Reviewed: CBC: Recent Labs  Lab 04/08/18 0400 04/09/18 0444 04/10/18 0400 04/11/18 0821 04/12/18 0438  WBC 10.2 9.9 8.9 12.4* 13.2*  HGB 8.6* 8.5* 8.5* 7.8* 8.6*  HCT 27.5* 28.1* 27.5* 25.4* 28.3*  MCV 95.5 95.9 96.2 96.2 95.9  PLT 230 246 236 236 223   Basic Metabolic Panel: Recent Labs  Lab 04/08/18 0400 04/09/18 0444 04/10/18 0400  NA 139 138 137  K 3.3* 3.9 4.2  CL 104 103 103  CO2 27 26 27   GLUCOSE 99 101* 106*  BUN 13 16 16   CREATININE 1.25* 1.29* 1.27*  CALCIUM 8.4* 8.4* 8.5*   GFR: Estimated Creatinine Clearance: 34.3 mL/min (A) (by C-G formula based on SCr of 1.27 mg/dL (H)). Liver Function Tests: No results for input(s): AST, ALT, ALKPHOS, BILITOT, PROT, ALBUMIN in the last 168 hours. No results for input(s): LIPASE, AMYLASE in the last 168 hours. No results for input(s): AMMONIA in the last 168 hours. Coagulation Profile: No results for input(s): INR, PROTIME in the last 168 hours. Cardiac Enzymes: No results for input(s): CKTOTAL, CKMB, CKMBINDEX, TROPONINI in the last 168 hours. BNP (last 3 results) No results for input(s): PROBNP in the last 8760 hours. HbA1C: No results for input(s): HGBA1C in the last 72 hours. CBG: No results for input(s): GLUCAP in the last 168 hours. Lipid Profile: No results for input(s): CHOL, HDL, LDLCALC, TRIG, CHOLHDL, LDLDIRECT in the last 72 hours. Thyroid Function Tests: No results for input(s): TSH, T4TOTAL, FREET4, T3FREE, THYROIDAB in the last 72 hours. Anemia Panel: No results for input(s): VITAMINB12, FOLATE, FERRITIN, TIBC, IRON, RETICCTPCT in the last 72 hours. Urine analysis:    Component Value Date/Time   COLORURINE YELLOW 06/13/2014 0018   APPEARANCEUR CLOUDY (A) 06/13/2014 0018   LABSPEC 1.018 06/13/2014 0018   PHURINE 5.0 06/13/2014 0018   GLUCOSEU NEGATIVE 06/13/2014 0018   HGBUR MODERATE (A)  06/13/2014 0018   BILIRUBINUR NEGATIVE 06/13/2014 0018   KETONESUR NEGATIVE 06/13/2014 0018   PROTEINUR NEGATIVE 06/13/2014 0018   UROBILINOGEN 0.2 06/13/2014 0018   NITRITE POSITIVE (A) 06/13/2014 0018   LEUKOCYTESUR LARGE (A) 06/13/2014 0018   Sepsis Labs: @LABRCNTIP (procalcitonin:4,lacticidven:4)  ) Recent Results (from the past 240 hour(s))  Surgical pcr screen     Status: None   Collection Time: 04/09/18  3:41 PM  Result Value Ref Range Status   MRSA, PCR NEGATIVE NEGATIVE Final   Staphylococcus aureus NEGATIVE NEGATIVE Final    Comment: (NOTE) The Xpert SA Assay (FDA approved for NASAL specimens in patients 65 years of age and older), is one component of a comprehensive surveillance program. It is not intended to diagnose infection nor to guide or monitor treatment. Performed at [facility], [address] [place], [place] [address]       Studies: No results found.  Scheduled Meds: . amLODipine  10 mg Oral Daily  . aspirin EC  325 mg Oral Daily  . citalopram  20 mg Oral Daily  . docusate sodium  100 mg Oral BID  .  ezetimibe-simvastatin  1 tablet Oral QHS  . ferrous sulfate  325 mg Oral BID WC  . metoprolol tartrate  100 mg Oral Daily  . metoprolol tartrate  25 mg Oral QPM  . pantoprazole  40 mg Oral Daily  . polyethylene glycol  17 g Oral Daily    Continuous Infusions: . sodium chloride 10 mL/hr at 04/12/18 0944  . methocarbamol (ROBAXIN) IV    . vancomycin 1,250 mg (04/12/18 0947)     LOS: 10 days     Alba Cory, MD [facility] Pager [phone]  If 7PM-7AM, please contact night-coverage [email or web address] Password [email or web address] 04/12/2018, 10:52 AM

## 2018-04-12 NOTE — Plan of Care (Signed)
  Problem: Nutrition: Goal: Adequate nutrition will be maintained Outcome: Progressing   Problem: Coping: Goal: Level of anxiety will decrease Outcome: Progressing   Problem: Elimination: Goal: Will not experience complications related to bowel motility Outcome: Progressing Goal: Will not experience complications related to urinary retention Outcome: Progressing   

## 2018-04-12 NOTE — Progress Notes (Signed)
Pharmacy Antibiotic Note  Ana Franco is a 84 y.o. 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 Per Dr. Lajoyce Corners antibiotics were to be discontinued 24 hours after surgery due to source control but now restarting Vancomycin per Dr. Sunnie Nielsen.  Pharmacy has been consulted to resume Vancomycin dosing.  SCr down to 1.27 with normalized CrCl ~30-35 mL/min.  Patient has good UOP recorded at 0.9 mL/kg/hr.  Patient remains afebrile. WBC is trending back up at 13.2.  Patient was previously on 1250 mg IV every 24 hours (a reduction in dose from 1500 mg IV every 24 hours which gave a slightly elevated trough level of 21).   Plan: Resume Vancomycin 1250 mg IV every 24 hours.  Determine length of therapy as now source control has been obtained with L-BK amputation.  Monitor renal function, culture results and clinical status.   Height: 5\' 6"  (167.6 cm) Weight: 211 lb 3.2 oz (95.8 kg) IBW/kg (Calculated) : 59.3  Temp (24hrs), Avg:98.1 F (36.7 C), Min:97.6 F (36.4 C), Max:98.6 F (37 C)  Recent Labs  Lab 04/06/18 1952 04/08/18 0400 04/09/18 0444 04/10/18 0400 04/11/18 0821 04/12/18 0438  WBC  --  10.2 9.9 8.9 12.4* 13.2*  CREATININE  --  1.25* 1.29* 1.27*  --   --   VANCOTROUGH 21*  --   --   --   --   --     Estimated Creatinine Clearance: 34.3 mL/min (A) (by C-G formula based on SCr of 1.27 mg/dL (H)).    No Known Allergies  Antimicrobials this admission: Vanco 8/22 >> 8/31; 9/1 >>  Dose adjustments this admission: 8/26 VT = 21 on 1500 mg IV every 24 hours >> adjusted to 1250 mg IV every 24 hours  Microbiology results: 8/20: L leg cx: MRSA  Thank you for allowing pharmacy to be a part of this patient's care.  Link Snuffer, PharmD, BCPS, BCCCP Clinical Pharmacist Clinical phone 04/12/2018 until 3:30PM [phone] Please refer to [facility] for [facility] Pharmacy numbers 04/12/2018 8:08 AM

## 2018-04-13 LAB — BASIC METABOLIC PANEL
Anion gap: 7 (ref 5–15)
Anion gap: 9 (ref 5–15)
BUN: 26 mg/dL — ABNORMAL HIGH (ref 8–23)
BUN: 23 mg/dL (ref 8–23)
CO2: 27 mmol/L (ref 22–32)
CO2: 28 mmol/L (ref 22–32)
Calcium: 8.1 mg/dL — ABNORMAL LOW (ref 8.9–10.3)
Calcium: 8.3 mg/dL — ABNORMAL LOW (ref 8.9–10.3)
Chloride: 102 mmol/L (ref 98–111)
Chloride: 100 mmol/L (ref 98–111)
Creatinine, Ser: 1.45 mg/dL — ABNORMAL HIGH (ref 0.44–1.00)
Creatinine, Ser: 1.41 mg/dL — ABNORMAL HIGH (ref 0.44–1.00)
GFR calc Af Amer: 36 mL/min — ABNORMAL LOW (ref >60)
GFR calc Af Amer: 37 mL/min — ABNORMAL LOW (ref >60)
GFR calc non Af Amer: 31 mL/min — ABNORMAL LOW (ref >60)
GFR calc non Af Amer: 32 mL/min — ABNORMAL LOW (ref >60)
Glucose, Bld: 104 mg/dL — ABNORMAL HIGH (ref 70–99)
Glucose, Bld: 141 mg/dL — ABNORMAL HIGH (ref 70–99)
Potassium: 4.2 mmol/L (ref 3.5–5.1)
Potassium: 4.3 mmol/L (ref 3.5–5.1)
Sodium: 136 mmol/L (ref 135–145)
Sodium: 137 mmol/L (ref 135–145)

## 2018-04-13 LAB — CBC
HCT: 28.8 % — ABNORMAL LOW (ref 36.0–46.0)
Hemoglobin: 8.9 g/dL — ABNORMAL LOW (ref 12.0–15.0)
MCH: 29.6 pg (ref 26.0–34.0)
MCHC: 30.9 g/dL (ref 30.0–36.0)
MCV: 95.7 fL (ref 78.0–100.0)
Platelets: 215 10*3/uL (ref 150–400)
RBC: 3.01 MIL/uL — ABNORMAL LOW (ref 3.87–5.11)
RDW: 14.4 % (ref 11.5–15.5)
WBC: 13.1 10*3/uL — ABNORMAL HIGH (ref 4.0–10.5)

## 2018-04-13 MED ORDER — SODIUM CHLORIDE 0.9 % IV SOLN
INTRAVENOUS | Status: DC
  Administered 2018-04-13 – 2018-04-14 (×2): via INTRAVENOUS

## 2018-04-13 MED ORDER — SENNA 8.6 MG PO TABS: 1.0000 | ORAL_TABLET | Freq: Every day | ORAL | Status: DC

## 2018-04-13 MED ORDER — POLYETHYLENE GLYCOL 3350 17 G PO PACK
17.0000 g | PACK | Freq: Two times a day (BID) | ORAL | Status: DC
  Administered 2018-04-13 – 2018-04-14 (×2): 17 g via ORAL
  Filled 2018-04-13 (×2): qty 1

## 2018-04-13 NOTE — Progress Notes (Signed)
PROGRESS NOTE  Ana Franco DZH:[id number] DOB: August 27, 1925 DOA: 04/02/2018 PCP: Gaspar Garbe, MD  HPI/Brief Narrative  Ana Franco is a 84 y.o. year old female with medical history significant for nonhealing surgical wound status post ORIF of left ankle (02/19/2018) with eventual hardware removal on 03/31/2018, HTN, HLD, A. fib not on anticoagulation, anemia, depression, GERD.[name] Recently here with wound dehiscence from ORIF with exposed hardware and underwent hardware removal on 03/31/2018 by Dr.Timothy Eulah Pont and discharged 04/01/18 on doxycycline for 2 weeks to rehab facility. She was seen at [facility] for follow up on 04/01/18 and found to have  Patient was directly admitted on 8/22 after intraoperative cultures resulted with MRSA resistant to doxycycline and started empirically on vancomycin.  Initial plan was to continue IV vancomycin for additional 6 weeks of therapy (* end date 05/14/18) with follow up with Dr. Orvan Falconer with ID and Dr. Eulah Pont her orthopedic surgeon.  However upon further evaluation by Dr. Lajoyce Corners on 8/26 concern for gangrenous necrotic wound over the medial malleolus with exposed hardware in addition to associated osteomyelitis that would require amputation.  Patient initially declined surgery and wished to return to SNF on 8/26; discuss further with family on 8/27 who now agreeable to pursuing surgery.  Discussed with Dr. Lajoyce Corners and plan for patient be taken to the OR on Friday, 04/10/2018.  Subjective Had problems with constipation, was clean out this am.     Assessment/Plan: Wound dehiscence/MRSA infection of left ankle(previous ORIF on 02/2018) status post hardware removal 03/31/2018, stable.  No confirmed radiographic evidence of osteomyelitis however given exposed hardware and now concern for development of gangrenous necrosis of wound IV antibiotics alone are not sufficient for therapy.  -awaiting sx  Tomorrow.  -patient underwent transtibial  amputation and application of wound vac on 8-30 by Dr Lajoyce Corners.  -discussed with Dr Lajoyce Corners, patient will need IV antibiotics for 24 hours after sx.  -WBC increasing, Dr Bertram Millard recommends to continue with Antibiotics. Resume vanc. Discussed with Dr Lajoyce Corners, discontinue antibiotics.  Repeat CBC in am, and he will check  wound.   Chronic anemia, stable.  Acute blood loss anemia.  Hb baseline 9--10. Has known iron deficiency anemia.  Monitor hb,  Continue with iron.  S/P one PRBC> 8-31 hb improved.   Paroxysmal atrial fibrillation, rate controlled.   Currently normal sinus rhythm.  Not on anticoagulation due to fall risk.  Hypertension, improving currently. Continue with amlodipine and metoprolol.  Mild increase cr at 1.25. Hold cozaar.  PRN hydralazine.   CKD, stage III, creatinine stable at baseline.   Mild increase cr at 1.2. Hold cozaar.  Increase cr, start low rate IV fluids. Discontinue vancomycin   Hypokalemia resolved./   Code Status: discussed code status with patient, son at bedside. Patient wishes to be DNR. thye understand that Code status is not follow during OR>    Family Communication: discussed with son  Disposition Plan: plan fo for amputation on 04/10/2018.  Consultants:   Orthopedics, Infectious Disease  Procedures:  none   Antimicrobials:  Vancomycin 8-22  Cultures:  none  Telemetry: no  DVT prophylaxis: SCDs   Objective: Vitals:   04/12/18 2120 04/13/18 0548 04/13/18 0622 04/13/18 0659  BP: (!) 131/58 (!) 172/73 (!) 164/71 (!) 156/68  Pulse: (!) 57 88 92 87  Resp: 18 18    Temp: 98.4 F (36.9 C) 98.1 F (36.7 C)    TempSrc: Oral Oral    SpO2: 96% 96%    Weight:  Height:        Intake/Output Summary (Last 24 hours) at 04/13/2018 1309 Last data filed at 04/13/2018 0945 Gross per 24 hour  Intake 954.56 ml  Output 750 ml  Net 204.56 ml   Filed Weights   04/02/18 1831  Weight: 95.8 kg    Exam:  General; NAD CVS; S 1, S 2 RRR LE;  S/P transtibial amputation, wound vac in place Abdomen; Soft, nt  Neuro; alert   Data Reviewed: CBC: Recent Labs  Lab 04/09/18 0444 04/10/18 0400 04/11/18 0821 04/12/18 0438 04/13/18 0330  WBC 9.9 8.9 12.4* 13.2* 13.1*  HGB 8.5* 8.5* 7.8* 8.6* 8.9*  HCT 28.1* 27.5* 25.4* 28.3* 28.8*  MCV 95.9 96.2 96.2 95.9 95.7  PLT 246 236 236 223 215   Basic Metabolic Panel: Recent Labs  Lab 04/08/18 0400 04/09/18 0444 04/10/18 0400 04/13/18 0330  NA 139 138 137 136  K 3.3* 3.9 4.2 4.2  CL 104 103 103 102  CO2 27 26 27 27   GLUCOSE 99 101* 106* 104*  BUN 13 16 16  26*  CREATININE 1.25* 1.29* 1.27* 1.45*  CALCIUM 8.4* 8.4* 8.5* 8.1*   GFR: Estimated Creatinine Clearance: 30.1 mL/min (A) (by C-G formula based on SCr of 1.45 mg/dL (H)). Liver Function Tests: No results for input(s): AST, ALT, ALKPHOS, BILITOT, PROT, ALBUMIN in the last 168 hours. No results for input(s): LIPASE, AMYLASE in the last 168 hours. No results for input(s): AMMONIA in the last 168 hours. Coagulation Profile: No results for input(s): INR, PROTIME in the last 168 hours. Cardiac Enzymes: No results for input(s): CKTOTAL, CKMB, CKMBINDEX, TROPONINI in the last 168 hours. BNP (last 3 results) No results for input(s): PROBNP in the last 8760 hours. HbA1C: No results for input(s): HGBA1C in the last 72 hours. CBG: No results for input(s): GLUCAP in the last 168 hours. Lipid Profile: No results for input(s): CHOL, HDL, LDLCALC, TRIG, CHOLHDL, LDLDIRECT in the last 72 hours. Thyroid Function Tests: No results for input(s): TSH, T4TOTAL, FREET4, T3FREE, THYROIDAB in the last 72 hours. Anemia Panel: No results for input(s): VITAMINB12, FOLATE, FERRITIN, TIBC, IRON, RETICCTPCT in the last 72 hours. Urine analysis:    Component Value Date/Time   COLORURINE YELLOW 06/13/2014 0018   APPEARANCEUR CLOUDY (A) 06/13/2014 0018   LABSPEC 1.018 06/13/2014 0018   PHURINE 5.0 06/13/2014 0018   GLUCOSEU NEGATIVE  06/13/2014 0018   HGBUR MODERATE (A) 06/13/2014 0018   BILIRUBINUR NEGATIVE 06/13/2014 0018   KETONESUR NEGATIVE 06/13/2014 0018   PROTEINUR NEGATIVE 06/13/2014 0018   UROBILINOGEN 0.2 06/13/2014 0018   NITRITE POSITIVE (A) 06/13/2014 0018   LEUKOCYTESUR LARGE (A) 06/13/2014 0018   Sepsis Labs: @LABRCNTIP (procalcitonin:4,lacticidven:4)  ) Recent Results (from the past 240 hour(s))  Surgical pcr screen     Status: None   Collection Time: 04/09/18  3:41 PM  Result Value Ref Range Status   MRSA, PCR NEGATIVE NEGATIVE Final   Staphylococcus aureus NEGATIVE NEGATIVE Final    Comment: (NOTE) The Xpert SA Assay (FDA approved for NASAL specimens in patients 59 years of age and older), is one component of a comprehensive surveillance program. It is not intended to diagnose infection nor to guide or monitor treatment. Performed at [facility], [address], [place], [place] [address]       Studies: No results found.  Scheduled Meds: . amLODipine  10 mg Oral Daily  . aspirin EC  325 mg Oral Daily  . citalopram  20 mg Oral Daily  .  docusate sodium  100 mg Oral BID  . ezetimibe-simvastatin  1 tablet Oral QHS  . ferrous sulfate  325 mg Oral BID WC  . metoprolol tartrate  100 mg Oral Daily  . metoprolol tartrate  25 mg Oral QPM  . pantoprazole  40 mg Oral BID  . polyethylene glycol  17 g Oral BID    Continuous Infusions: . sodium chloride 10 mL/hr at 04/12/18 1639  . sodium chloride 50 mL/hr at 04/13/18 1143  . methocarbamol (ROBAXIN) IV       LOS: 11 days     Alba Cory, MD [facility] Pager [phone]  If 7PM-7AM, please contact night-coverage [email or web address] Password [email or web address] 04/13/2018, 1:09 PM

## 2018-04-13 NOTE — Plan of Care (Signed)
  Problem: Nutrition: Goal: Adequate nutrition will be maintained Outcome: Progressing   Problem: Coping: Goal: Level of anxiety will decrease Outcome: Progressing   Problem: Pain Managment: Goal: General experience of comfort will improve Outcome: Progressing   Problem: Safety: Goal: Ability to remain free from injury will improve Outcome: Progressing   

## 2018-04-13 NOTE — Social Work (Signed)
CSW continuing to follow, faxed updated PT note to [employer] for [employer], however [facility] Medicare offices are closed to due holiday.   Doy Hutching, LCSWA [facility] Clinical Social Work [phone]

## 2018-04-13 NOTE — Progress Notes (Signed)
Subjective: POD # 3 Left BKA Patient reports pain under control.  Constipated.    Objective: Vital signs in last 24 hours: Temp:  [97.8 F (36.6 C)-98.4 F (36.9 C)] 98.1 F (36.7 C) (09/02 0548) Pulse Rate:  [57-92] 87 (09/02 0659) Resp:  [17-18] 18 (09/02 0548) BP: (131-172)/(49-73) 156/68 (09/02 0659) SpO2:  [96 %-98 %] 96 % (09/02 0548)  Intake/Output from previous day: 09/01 0701 - 09/02 0700 In: 1174.6 [P.O.:700; I.V.:224.6; IV Piggyback:250] Out: 1200 [Urine:1150; Drains:50] Intake/Output this shift: No intake/output data recorded.  Recent Labs    04/11/18 0821 04/12/18 0438 04/13/18 0330  HGB 7.8* 8.6* 8.9*   Recent Labs    04/12/18 0438 04/13/18 0330  WBC 13.2* 13.1*  RBC 2.95* 3.01*  HCT 28.3* 28.8*  PLT 223 215   Recent Labs    04/13/18 0330  NA 136  K 4.2  CL 102  CO2 27  BUN 26*  CREATININE 1.45*  GLUCOSE 104*  CALCIUM 8.1*   No results for input(s): LABPT, INR in the last 72 hours.  Left transtibial amputation with VAC dressing intact. Minimal Serosanguinous drainage in canister.  Patient alert and oriented and appropriate.     Assessment/Plan: POD #3 Left Transtibial amputation- Continue PT/OT.  Plans SNF and ready for SNF from ortho standpoint.  Leave VAC in place at discharge and follow up in the office in 1 week.   [employer] [phone]

## 2018-04-14 ENCOUNTER — Encounter (HOSPITAL_COMMUNITY): Payer: Self-pay

## 2018-04-14 LAB — CBC
HCT: 28.9 % — ABNORMAL LOW (ref 36.0–46.0)
Hemoglobin: 8.8 g/dL — ABNORMAL LOW (ref 12.0–15.0)
MCH: 29.4 pg (ref 26.0–34.0)
MCHC: 30.4 g/dL (ref 30.0–36.0)
MCV: 96.7 fL (ref 78.0–100.0)
Platelets: 229 10*3/uL (ref 150–400)
RBC: 2.99 MIL/uL — ABNORMAL LOW (ref 3.87–5.11)
RDW: 14.4 % (ref 11.5–15.5)
WBC: 10.7 10*3/uL — ABNORMAL HIGH (ref 4.0–10.5)

## 2018-04-14 LAB — BASIC METABOLIC PANEL
Anion gap: 7 (ref 5–15)
BUN: 18 mg/dL (ref 8–23)
CO2: 27 mmol/L (ref 22–32)
Calcium: 8.3 mg/dL — ABNORMAL LOW (ref 8.9–10.3)
Chloride: 102 mmol/L (ref 98–111)
Creatinine, Ser: 1.36 mg/dL — ABNORMAL HIGH (ref 0.44–1.00)
GFR calc Af Amer: 38 mL/min — ABNORMAL LOW (ref >60)
GFR calc non Af Amer: 33 mL/min — ABNORMAL LOW (ref >60)
Glucose, Bld: 127 mg/dL — ABNORMAL HIGH (ref 70–99)
Potassium: 4.4 mmol/L (ref 3.5–5.1)
Sodium: 136 mmol/L (ref 135–145)

## 2018-04-14 MED ORDER — HYDROCODONE-ACETAMINOPHEN 5-325 MG PO TABS: 1.0000 | ORAL_TABLET | Freq: Four times a day (QID) | ORAL | 0 refills | Status: AC | PRN

## 2018-04-14 MED ORDER — POLYETHYLENE GLYCOL 3350 17 G PO PACK: 17.0000 g | PACK | Freq: Two times a day (BID) | ORAL | 0 refills | Status: DC

## 2018-04-14 MED ORDER — DOCUSATE SODIUM 100 MG PO CAPS: 100.0000 mg | ORAL_CAPSULE | Freq: Two times a day (BID) | ORAL | 0 refills | Status: DC

## 2018-04-14 NOTE — Clinical Social Work Placement (Signed)
   CLINICAL SOCIAL WORK PLACEMENT  NOTE [place] RN to call report to [phone] ext. [phone]  Date:  04/14/2018  Patient Details  Name: Ana Franco MRN: [id number] Date of Birth: May 25, 1926  Clinical Social Work is seeking post-discharge placement for this patient at the Skilled  Nursing Facility level of care (*CSW will initial, date and re-position this form in  chart as items are completed):  Yes   Patient/family provided with [facility] Clinical Social Work Department's list of facilities offering this level of care within the geographic area requested by the patient (or if unable, by the patient's family).  Yes   Patient/family informed of their freedom to choose among providers that offer the needed level of care, that participate in Medicare, Medicaid or managed care program needed by the patient, have an available bed and are willing to accept the patient.  Yes   Patient/family informed of 's ownership interest in [facility] and [facility], as well as of the fact that they are under no obligation to receive care at these facilities.  PASRR submitted to EDS on       PASRR number received on 04/01/18     Existing PASRR number confirmed on       FL2 transmitted to all facilities in geographic area requested by pt/family on 04/01/18     FL2 transmitted to all facilities within larger geographic area on       Patient informed that his/her managed care company has contracts with or will negotiate with certain facilities, including the following:        Yes   Patient/family informed of bed offers received.  Patient chooses bed at [facility]     Physician recommends and patient chooses bed at      Patient to be transferred to [facility] [place] on 04/14/18.  Patient to be transferred to facility by [facility]     Patient family notified on 04/14/18 of transfer.  Name of family member notified:  son, Brett Canales     PHYSICIAN      Additional Comment:    _______________________________________________ Doy Hutching, LCSWA 04/14/2018, 1:55 PM

## 2018-04-14 NOTE — Progress Notes (Signed)
Physical Therapy Treatment Patient Details Name: Ana Franco MRN: [id number] DOB: [birth year] Today's Date: 04/14/2018    History of Present Illness Pt is a 84 y/o female s/p L transtibial amputation on 8/30. PMH including but not limited to MI (02/19/18), HTN and HLD.    PT Comments    Pt progressing with bed mobility and tolerated amputee exercises well today. Stood to stedy with mod A for power up and was able to stand in stedy for 2 mins for 2 bouts. PT will continue to follow.    Follow Up Recommendations  SNF     Equipment Recommendations  None recommended by PT    Recommendations for Other Services       Precautions / Restrictions Precautions Precautions: Fall Precaution Comments: wound VAC Restrictions Weight Bearing Restrictions: Yes LLE Weight Bearing: Non weight bearing    Mobility  Bed Mobility Overal bed mobility: Needs Assistance Bed Mobility: Supine to Sit     Supine to sit: Supervision     General bed mobility comments: pt able to get to EOB with increased time and use of rail but no physical assist  Transfers Overall transfer level: Needs assistance Equipment used: Ambulation equipment used Transfers: Sit to/from Stand Sit to Stand: Mod assist         General transfer comment: mod A for power up, vc's for proper placement on R foot on platform  Ambulation/Gait             General Gait Details: unable to hop on RLE   Stairs             Wheelchair Mobility    Modified Rankin (Stroke Patients Only)       Balance Overall balance assessment: Needs assistance Sitting-balance support: Bilateral upper extremity supported Sitting balance-Leahy Scale: Fair Sitting balance - Comments: pt able to maintain balance EOB and edge of chair for seated exercises   Standing balance support: Bilateral upper extremity supported;During functional activity Standing balance-Leahy Scale: Poor Standing balance comment: reliant on stedy, B UE  support                            Cognition Arousal/Alertness: Awake/alert Behavior During Therapy: WFL for tasks assessed/performed Overall Cognitive Status: Within Functional Limits for tasks assessed                                        Exercises Amputee Exercises Quad Sets: AROM;Strengthening;Left;10 reps;Supine Gluteal Sets: AROM;10 reps;Supine Hip Extension: AROM;Left;10 reps;Supine Hip ABduction/ADduction: AROM;Strengthening;Left;10 reps;Supine Knee Flexion: AAROM;Left;5 reps;Supine Knee Extension: AROM;Left;10 reps;Seated Straight Leg Raises: AROM;Strengthening;Left;10 reps;Supine Other Exercises Other Exercises: Had pt practice gentle tactile stimulation to L residual limb    General Comments        Pertinent Vitals/Pain Pain Assessment: Faces Faces Pain Scale: Hurts even more Pain Location: L residual limb Pain Descriptors / Indicators: Sore Pain Intervention(s): Limited activity within patient's tolerance;Monitored during session    Home Living                      Prior Function            PT Goals (current goals can now be found in the care plan section) Acute Rehab PT Goals Patient Stated Goal: return to SNF PT Goal Formulation: With patient Time For Goal Achievement: 04/25/18 Potential to Achieve Goals:  Good Progress towards PT goals: Progressing toward goals    Frequency    Min 3X/week      PT Plan Current plan remains appropriate    Co-evaluation              AM-PAC PT "6 Clicks" Daily Activity  Outcome Measure  Difficulty turning over in bed (including adjusting bedclothes, sheets and blankets)?: A Little Difficulty moving from lying on back to sitting on the side of the bed? : A Little Difficulty sitting down on and standing up from a chair with arms (e.g., wheelchair, bedside commode, etc,.)?: Unable Help needed moving to and from a bed to chair (including a wheelchair)?: Total Help  needed walking in hospital room?: Total Help needed climbing 3-5 steps with a railing? : Total 6 Click Score: 10    End of Session Equipment Utilized During Treatment: Gait belt Activity Tolerance: Patient tolerated treatment well Patient left: in chair;with call bell/phone within reach Nurse Communication: Mobility status PT Visit Diagnosis: Other abnormalities of gait and mobility (R26.89);Pain Pain - Right/Left: Left Pain - part of body: Leg     Time: 1191-4782 PT Time Calculation (min) (ACUTE ONLY): 32 min  Charges:  $Therapeutic Exercise: 8-22 mins $Therapeutic Activity: 8-22 mins                     Lyanne Co, PT  Acute Rehab Services  [phone]    [place] 04/14/2018, 10:54 AM

## 2018-04-14 NOTE — Plan of Care (Signed)

## 2018-04-14 NOTE — Progress Notes (Signed)
Patient discharge instructions completed by day shift nurse. VS WNL. Wound Vac attached prior to discharge. Belongings were given to transportation. Patient was alert & oriented x4 whenever discharged. No issues noted.

## 2018-04-14 NOTE — Discharge Summary (Signed)
Physician Discharge Summary  Shayla Heming AVW:[id number] DOB: 16-Jun-1926 DOA: 04/02/2018  PCP: Gaspar Garbe, MD  Admit date: 04/02/2018 Discharge date: 04/14/2018  Admitted From: SNF Disposition: SNF  Recommendations for Outpatient Follow-up:  1. Follow up with PCP in 1-2 weeks 2. Please obtain BMP/CBC in one week 3. Needs to follow up with Dr Lajoyce Corners for post operative care.  4. Needs Praveena plus portable wound vac for 1 week   Discharge Condition: Stable.  CODE STATUS: DNR Diet recommendation: Heart Healthy  Brief/Interim Summary:  HPI/Brief Narrative  Ana Franco is a 84 y.o. year old female with medical history significant for nonhealing surgical wound status post ORIF of left ankle (02/19/2018) with eventual hardware removal on 03/31/2018, HTN, HLD, A. fib not on anticoagulation, anemia, depression, GERD.[name] Recently here with wound dehiscence from ORIF with exposed hardware and underwent hardware removal on 03/31/2018 by Dr.Timothy Eulah Pont and discharged 04/01/18 on doxycycline for 2 weeks to rehab facility. She was seen at [facility] for follow up on 04/01/18 and found to have  Patient was directly admitted on 8/22 after intraoperative cultures resulted with MRSA resistant to doxycycline and started empirically on vancomycin.  Initial plan was to continue IV vancomycin for additional 6 weeks of therapy (* end date 05/14/18) with follow up with Dr. Orvan Falconer with ID and Dr. Eulah Pont her orthopedic surgeon.  However upon further evaluation by Dr. Lajoyce Corners on 8/26 concern for gangrenous necrotic wound over the medial malleolus with exposed hardware in addition to associated osteomyelitis that would require amputation.  Patient initially declined surgery and wished to return to SNF on 8/26; discuss further with family on 8/27 who now agreeable to pursuing surgery.  Discussed with Dr. Lajoyce Corners and plan for patient be taken to the OR on Friday, 04/10/2018.  Subjective; She is  feeling better . Pain controlled.   Assessment/Plan: Wound dehiscence/MRSA infection of left ankle(previous ORIF on 02/2018) status post hardware removal 03/31/2018, stable.  No confirmed radiographic evidence of osteomyelitis however given exposed hardware and now concern for development of gangrenous necrosis of wound IV antibiotics alone are not sufficient for therapy.  -awaiting sx  Tomorrow.  -patient underwent transtibial amputation and application of wound vac on 8-30 by Dr Lajoyce Corners.  -discussed with Dr Lajoyce Corners, patient will need IV antibiotics for 24 hours after sx.  -WBC increasing, Dr Bertram Millard recommends to continue with Antibiotics. Resume vanc. Discussed with Dr Lajoyce Corners, discontinue antibiotics.  -WBC stable. She will be discharge with praveena wound vac.   Chronic anemia, stable.  Acute blood loss anemia.  Hb baseline 9--10. Has known iron deficiency anemia.  Monitor hb,  Continue with iron.  S/P one PRBC> 8-31 hb improved.   Paroxysmal atrial fibrillation, rate controlled.   Currently normal sinus rhythm.  Not on anticoagulation due to fall risk.  Hypertension, improving currently. Continue with amlodipine and metoprolol.  Mild increase cr at 1.25. Hold cozaar.  PRN hydralazine.   CKD, stage III, creatinine stable at baseline.   Mild increase cr at 1.2. Hold cozaar.  Increase cr, start low rate IV fluids. Discontinue vancomycin  Awaiting Bmet this am, if renal function stable   Hypokalemia resolved./   Code Status: DNR  Discharge Diagnoses:  Principal Problem:   Wound infection Active Problems:   Essential hypertension, benign   Dyslipidemia   Closed left ankle fracture, with delayed healing, subsequent encounter   MRSA (methicillin resistant staph aureus) culture positive   Acute blood loss anemia   S/P ORIF (open  reduction internal fixation) fracture   Gangrene of lower extremity (HCC)   Subacute osteomyelitis, left ankle and foot [facility])    Discharge  Instructions  Discharge Instructions    Apply cam walker   Complete by:  As directed    Laterality:  Left   Change dressing   Complete by:  As directed    Dry dressing medial left ankle change daily   Diet - low sodium heart healthy   Complete by:  As directed    Elevate operative extremity   Complete by:  As directed    Home infusion instructions [facility] May follow [facility] Dosing Protocol; May administer Cathflo as needed to maintain patency of vascular access device.; Flushing of vascular access device: per [facility] Protocol: 0.9% NaCl pre/post medica...   Complete by:  As directed    Instructions:  May follow [facility] Dosing Protocol   Instructions:  May administer Cathflo as needed to maintain patency of vascular access device.   Instructions:  Flushing of vascular access device: per [facility] Protocol: 0.9% NaCl pre/post medication administration and prn patency; Heparin 100 u/ml, 18ml for implanted ports and Heparin 10u/ml, 18ml for all other central venous catheters.   Instructions:  May follow [facility] Anaphylaxis Protocol for First Dose Administration in the home: 0.9% NaCl at 25-50 ml/hr to maintain IV access for protocol meds. Epinephrine 0.3 ml IV/IM PRN and Benadryl 25-50 IV/IM PRN s/s of anaphylaxis.   Instructions:  [facility] Infusion Coordinator (RN) to assist per patient IV care needs in the home PRN.   Increase activity slowly   Complete by:  As directed    Negative Pressure Wound Therapy - Incisional   Complete by:  As directed    Continue vac for 2 weeks   Negative Pressure Wound Therapy - Incisional   Complete by:  As directed    Attached to the wound VAC dressing to the Praveena plus portable wound VAC pump for discharge   Non weight bearing   Complete by:  As directed    Laterality:  left   Extremity:  Lower     Allergies as of 04/14/2018   No Known Allergies     Medication List    STOP taking these medications   ciprofloxacin 500 MG  tablet Commonly known as:  CIPRO   doxycycline 50 MG capsule Commonly known as:  VIBRAMYCIN   losartan 100 MG tablet Commonly known as:  COZAAR     TAKE these medications   acetaminophen 500 MG tablet Commonly known as:  TYLENOL Take 500 mg by mouth at bedtime.   acetaminophen 325 MG tablet Commonly known as:  TYLENOL Take 325 mg by mouth every 6 (six) hours as needed for headache (pain).   amLODipine 5 MG tablet Commonly known as:  NORVASC Take 2 tablets (10 mg total) by mouth daily. What changed:  how much to take   aspirin EC 325 MG tablet Take 1 tablet (325 mg total) by mouth daily.   beta carotene w/minerals tablet Take 1 tablet by mouth daily.   DECUBI-VITE PO Take 1 capsule by mouth daily.   calcium-vitamin D 500-200 MG-UNIT tablet Commonly known as:  OSCAL WITH D Take 1 tablet by mouth daily.   citalopram 20 MG tablet Commonly known as:  CELEXA Take 20 mg by mouth daily.   docusate sodium 100 MG capsule Commonly known as:  COLACE Take 1 capsule (100 mg total) by mouth 2 (two) times daily.  esomeprazole 40 MG capsule Commonly known as:  NEXIUM Take 40 mg by mouth daily.   ezetimibe-simvastatin 10-40 MG tablet Commonly known as:  VYTORIN Take 1 tablet by mouth at bedtime.   ferrous sulfate 325 (65 FE) MG tablet Take 325 mg by mouth 2 (two) times daily with a meal.   HYDROcodone-acetaminophen 5-325 MG tablet Commonly known as:  NORCO/VICODIN Take 1 tablet by mouth every 6 (six) hours as needed for up to 5 days for severe pain. What changed:  reasons to take this   metoprolol tartrate 50 MG tablet Commonly known as:  LOPRESSOR Take 50 mg by mouth 2 (two) times daily.   nitroGLYCERIN 0.4 MG SL tablet Commonly known as:  NITROSTAT Place 0.4 mg under the tongue every 5 (five) minutes as needed for chest pain.   ondansetron 4 MG tablet Commonly known as:  ZOFRAN Take 1 tablet (4 mg total) by mouth every 8 (eight) hours as needed for nausea or  vomiting.   polyethylene glycol packet Commonly known as:  MIRALAX / GLYCOLAX Take 17 g by mouth 2 (two) times daily.   vitamin B-12 1000 MCG tablet Commonly known as:  CYANOCOBALAMIN Take 1,000 mcg by mouth daily.            Home Infusion Instuctions  (From admission, onward)         Start     Ordered   04/05/18 0000  Home infusion instructions [facility] May follow [facility] Dosing Protocol; May administer Cathflo as needed to maintain patency of vascular access device.; Flushing of vascular access device: per [facility] Protocol: 0.9% NaCl pre/post medica...    Question Answer Comment  Instructions May follow [facility] Dosing Protocol   Instructions May administer Cathflo as needed to maintain patency of vascular access device.   Instructions Flushing of vascular access device: per [facility] Protocol: 0.9% NaCl pre/post medication administration and prn patency; Heparin 100 u/ml, 5ml for implanted ports and Heparin 10u/ml, 5ml for all other central venous catheters.   Instructions May follow [facility] Anaphylaxis Protocol for First Dose Administration in the home: 0.9% NaCl at 25-50 ml/hr to maintain IV access for protocol meds. Epinephrine 0.3 ml IV/IM PRN and Benadryl 25-50 IV/IM PRN s/s of anaphylaxis.   Instructions [facility] Infusion Coordinator (RN) to assist per patient IV care needs in the home PRN.      04/05/18 1419           Discharge Care Instructions  (From admission, onward)         Start     Ordered   04/06/18 0000  Change dressing    Comments:  Dry dressing medial left ankle change daily   04/06/18 1749   04/06/18 0000  Non weight bearing    Question Answer Comment  Laterality left   Extremity Lower      04/06/18 1749          Contact information for follow-up providers    Nadara Mustard, MD Follow up in 1 week(s).   Specialty:  Orthopedic Surgery Contact information: [address] [place] [address] [phone]             Contact information for after-discharge care    Destination    [facility] .   Service:  Skilled Nursing Contact information: [address] [place] [address] [phone]  No Known Allergies  Consultations: DR Lajoyce Corners  Procedures/Studies: Ct Ankle Left Wo Contrast  Result Date: 03/23/2018 CLINICAL DATA:  The patient suffered a trimalleolar left ankle fracture in a fall 02/19/2018 and underwent subsequent open reduction internal fixation. Lateral ankle infection. EXAM: CT OF THE LEFT ANKLE WITHOUT CONTRAST TECHNIQUE: Multidetector CT imaging of the left ankle was performed according to the standard protocol. Multiplanar CT image reconstructions were also generated. COMPARISON:  None. FINDINGS: Bones/Joint/Cartilage The patient is status post fixation of medial, the patient is status post fixation of a medial malleolar fracture with a single screw in place. Posterior plate and screws for fixation of a posterior malleolar fracture also identified. Finally, the patient is status post lateral plate and screw fixation of a distal fibular fracture. All hardware is intact without evidence of loosening. A gap at the posterior aspect of the tibial plafond and measures 0.5 cm AP by approximately 1.6 cm transverse. There is some bridging bone about all of the patient's fractures. A defect in the anterior cortex of the distal fibula measures 2 cm craniocaudal. Several small cortical fragments are seen along the anterior aspect of the distal fibula at the level of this cortical defect. There is some periosteal reaction along the distal metaphysis of the tibia. No acute fracture is identified. Ligaments Suboptimally assessed by CT. Muscles and Tendons Visualization is limited due to streak artifact from hardware but no tear is identified. No intramuscular fluid collection is seen. No gas within muscle or tracking along fascial planes is  present. Soft tissues There is subcutaneous edema about the ankle. Bandaging is present over the lateral malleolus. No underlying focal fluid collection, gas or unexpected radiopaque foreign body is identified. IMPRESSION: Soft tissue wound over the lateral malleolus. No underlying foreign body or soft tissue gas identified. There is a defect in the anterior cortex of the distal fibula deep to the wound which could be posttraumatic or surgical but could also be secondary to osteomyelitis. Status post fixation of trimalleolar fractures. There is evidence of healing of all the patient's fractures. Hardware is intact without evidence of loosening. Electronically Signed   By: Drusilla Kanner M.D.   On: 03/23/2018 14:05   Dg Ankle Left Port  Result Date: 04/04/2018 CLINICAL DATA:  Left closed ankle fracture with delayed healing. EXAM: PORTABLE LEFT ANKLE - 2 VIEW COMPARISON:  CT scan of March 23, 2018. FINDINGS: Status post surgical fixation of old medial malleolar and posterior malleolar fractures. There appears to have been recent removal of fixation hardware from the distal left fibula. There it appears to be interval development of moderately displaced distal left fibular fracture with lateral dislocation of the talus relative to the distal tibia. IMPRESSION: Postsurgical changes are again noted in distal left tibia. Fixation hardware involving distal left fibula has been removed recently. There is interval development of moderately displaced distal left fibular fracture with lateral dislocation of talus relative to distal tibia. Electronically Signed   By: Lupita Raider, M.D.   On: 04/04/2018 11:02   [place] Ekg Site Rite  Result Date: 04/03/2018 If Site Rite image not attached, placement could not be confirmed due to current cardiac rhythm.     Subjective: She is feeling well, had BM yesterday  Pain controlled.   Discharge Exam: Vitals:   04/14/18 0506 04/14/18 0811  BP: (!) 154/59 (!) 148/81   Pulse: (!) 58 71  Resp: 16   Temp: 97.9 F (36.6 C)   SpO2: 95%    Vitals:  04/13/18 2123 04/13/18 2124 04/14/18 0506 04/14/18 0811  BP: (!) 133/55 (!) 133/55 (!) 154/59 (!) 148/81  Pulse: (!) 58 72 (!) 58 71  Resp: 16 16 16    Temp: 97.8 F (36.6 C) 98.7 F (37.1 C) 97.9 F (36.6 C)   TempSrc: Oral Oral Oral   SpO2: 96% 98% 95%   Weight:      Height:        General: Pt is alert, awake, not in acute distress Cardiovascular: RRR, S1/S2 +, no rubs, no gallops Respiratory: CTA bilaterally, no wheezing, no rhonchi Abdominal: Soft, NT, ND, bowel sounds + Extremities: LE with wound vac in place.     The results of significant diagnostics from this hospitalization (including imaging, microbiology, ancillary and laboratory) are listed below for reference.     Microbiology: Recent Results (from the past 240 hour(s))  Surgical pcr screen     Status: None   Collection Time: 04/09/18  3:41 PM  Result Value Ref Range Status   MRSA, PCR NEGATIVE NEGATIVE Final   Staphylococcus aureus NEGATIVE NEGATIVE Final    Comment: (NOTE) The Xpert SA Assay (FDA approved for NASAL specimens in patients 55 years of age and older), is one component of a comprehensive surveillance program. It is not intended to diagnose infection nor to guide or monitor treatment. Performed at [facility], [address], [place], [place] [address]      Labs: BNP (last 3 results) No results for input(s): BNP in the last 8760 hours. Basic Metabolic Panel: Recent Labs  Lab 04/08/18 0400 04/09/18 0444 04/10/18 0400 04/13/18 0330 04/13/18 1232  NA 139 138 137 136 137  K 3.3* 3.9 4.2 4.2 4.3  CL 104 103 103 102 100  CO2 27 26 27 27 28   GLUCOSE 99 101* 106* 104* 141*  BUN 13 16 16  26* 23  CREATININE 1.25* 1.29* 1.27* 1.45* 1.41*  CALCIUM 8.4* 8.4* 8.5* 8.1* 8.3*   Liver Function Tests: No results for input(s): AST, ALT, ALKPHOS, BILITOT, PROT, ALBUMIN in the last 168 hours. No results for  input(s): LIPASE, AMYLASE in the last 168 hours. No results for input(s): AMMONIA in the last 168 hours. CBC: Recent Labs  Lab 04/10/18 0400 04/11/18 0821 04/12/18 0438 04/13/18 0330 04/14/18 0457  WBC 8.9 12.4* 13.2* 13.1* 10.7*  HGB 8.5* 7.8* 8.6* 8.9* 8.8*  HCT 27.5* 25.4* 28.3* 28.8* 28.9*  MCV 96.2 96.2 95.9 95.7 96.7  PLT 236 236 223 215 229   Cardiac Enzymes: No results for input(s): CKTOTAL, CKMB, CKMBINDEX, TROPONINI in the last 168 hours. BNP: Invalid input(s): POCBNP CBG: No results for input(s): GLUCAP in the last 168 hours. D-Dimer No results for input(s): DDIMER in the last 72 hours. Hgb A1c No results for input(s): HGBA1C in the last 72 hours. Lipid Profile No results for input(s): CHOL, HDL, LDLCALC, TRIG, CHOLHDL, LDLDIRECT in the last 72 hours. Thyroid function studies No results for input(s): TSH, T4TOTAL, T3FREE, THYROIDAB in the last 72 hours.  Invalid input(s): FREET3 Anemia work up No results for input(s): VITAMINB12, FOLATE, FERRITIN, TIBC, IRON, RETICCTPCT in the last 72 hours. Urinalysis    Component Value Date/Time   COLORURINE YELLOW 06/13/2014 0018   APPEARANCEUR CLOUDY (A) 06/13/2014 0018   LABSPEC 1.018 06/13/2014 0018   PHURINE 5.0 06/13/2014 0018   GLUCOSEU NEGATIVE 06/13/2014 0018   HGBUR MODERATE (A) 06/13/2014 0018   BILIRUBINUR NEGATIVE 06/13/2014 0018   KETONESUR NEGATIVE 06/13/2014 0018   PROTEINUR NEGATIVE 06/13/2014 0018  UROBILINOGEN 0.2 06/13/2014 0018   NITRITE POSITIVE (A) 06/13/2014 0018   LEUKOCYTESUR LARGE (A) 06/13/2014 0018   Sepsis Labs Invalid input(s): PROCALCITONIN,  WBC,  LACTICIDVEN Microbiology Recent Results (from the past 240 hour(s))  Surgical pcr screen     Status: None   Collection Time: 04/09/18  3:41 PM  Result Value Ref Range Status   MRSA, PCR NEGATIVE NEGATIVE Final   Staphylococcus aureus NEGATIVE NEGATIVE Final    Comment: (NOTE) The Xpert SA Assay (FDA approved for NASAL specimens in  patients 37 years of age and older), is one component of a comprehensive surveillance program. It is not intended to diagnose infection nor to guide or monitor treatment. Performed at [facility], [address], [place], [place] [address]      Time coordinating discharge: 35 minutes.   SIGNED:   Alba Cory, MD  [facility] 04/14/2018, 10:37 AM Pager [phone]  If 7PM-7AM, please contact night-coverage [email or web address] Password [email or web address]

## 2018-04-14 NOTE — Social Work (Addendum)
CSW will fax discharge summary, progress note and information regarding wound vac to SNF.   Await d/c summary. Auth still pending.   1:15pm- Summary sent to [facility].  Doy Hutching, LCSWA [facility] Clinical Social Work [phone]

## 2018-04-14 NOTE — Progress Notes (Signed)
Patient ID: Ana Franco, female   DOB: [birth year], 84 y.o.   MRN: [id number] Patient is status post transtibial amputation on the left.  Her white blood cell count has decreased to 10.7 was 13.  Hemoglobin is stable at 8.8.  Platelet count is stable.  There is approximately 200 cc in the wound VAC canister no signs of active bleeding at this time, patient is comfortable no clinical signs of infection.  Patient should be safe from orthopedic standpoint for discharge to skilled nursing with the Praveena plus portable wound VAC to continue for 1 week.

## 2018-04-14 NOTE — Social Work (Signed)
Clinical Social Worker facilitated patient discharge including contacting patient family and facility to confirm patient discharge plans.  Clinical information faxed to facility and family agreeable with plan.  CSW arranged ambulance transport via [facility] to [facility].  RN to call [phone] ext [phone] with report  prior to discharge.  Clinical Social Worker will sign off for now as social work intervention is no longer needed. Please consult [place] again if new need arises.  Doy Hutching, [place] Clinical Social Worker [phone]

## 2018-04-21 ENCOUNTER — Encounter (INDEPENDENT_AMBULATORY_CARE_PROVIDER_SITE_OTHER): Payer: Self-pay | Admitting: Physician Assistant

## 2018-04-21 ENCOUNTER — Ambulatory Visit (INDEPENDENT_AMBULATORY_CARE_PROVIDER_SITE_OTHER): Payer: Medicare Other | Admitting: Physician Assistant

## 2018-04-21 VITALS — Ht 66.0 in | Wt 211.2 lb

## 2018-04-21 DIAGNOSIS — Z89512 Acquired absence of left leg below knee: Secondary | ICD-10-CM

## 2018-04-21 NOTE — Progress Notes (Signed)
Office Visit Note   Patient: Ana Franco           Date of Birth: 27-Jan-1926           MRN: [id number] Visit Date: 04/21/2018              Requested by: Gaspar Garbe, MD [address] [place], [place] [address] PCP: Wylene Simmer Adelfa Koh, MD  Chief Complaint  Patient presents with  . Left Leg - Follow-up      HPI: Patient is a 84 year old female who is seen for postoperative follow-up following a left below the knee amputation on 04/10/2018.  She had the [facility] on for the last week.  She is completed her postoperative antibiotics.  She is now residing at [facility].  She reports some pain in the amputation site but overall is doing well.  She is ambulating some short distances with her walker and physical therapy at [facility].  Assessment & Plan: Visit Diagnoses:  1. 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  They will begin some Silvadene cream to the incisional area cover with gauze ABD Curlex and Ace wrapping for edema control.  She will follow-up in 1 week for wound recheck.  Follow-Up Instructions: Return in about 1 week (around 04/28/2018).   Ortho Exam  Patient is alert, oriented, no adenopathy, well-dressed, normal affect, normal respiratory effort. Examination of the left below the knee amputation site shows the incision to be loosely closed.  She does have moderate serosanguineous drainage.  She has some epidermal lysis about the lateral incision line and slight opening about the central incisional area.  She has some ecchymosis about the incision as well.  She had been on either Lovenox or subcutaneous heparin injections while hospitalized and does have a hematoma over the right lower leg on the calf and over the right forearm and upper extremity where she had a PICC line.  Imaging: No results found. No images are attached to the encounter.  Labs: Lab Results  Component Value Date   REPTSTATUS 04/05/2018 FINAL 03/31/2018   GRAMSTAIN  03/31/2018    NO WBC SEEN RARE GRAM POSITIVE COCCI IN PAIRS Gram Stain Report Called to,Read Back By and Verified With: T.GREEN AT 1450 ON 03/31/18 BY N.THOMPSON Performed at [facility], [address] [place], [place] [address]    CULT  03/31/2018    FEW METHICILLIN RESISTANT STAPHYLOCOCCUS AUREUS NO ANAEROBES ISOLATED Performed at [facility], [address], [place], [place] [address]    LABORGA METHICILLIN RESISTANT STAPHYLOCOCCUS AUREUS 03/31/2018     Lab Results  Component Value Date   ALBUMIN 2.7 (L) 04/04/2018   ALBUMIN 3.4 (L) 06/12/2014    Body mass index is 34.09 kg/m.  Orders:  No orders of the defined types were placed in this encounter.  No orders of the defined types were placed in this encounter.    Procedures: No procedures performed  Clinical Data: No additional findings.  ROS:  All other systems negative, except as noted in the HPI. Review of Systems  Objective: Vital Signs: Ht 5\' 6"  (1.676 m)   Wt 211 lb 3.2 oz (95.8 kg)   BMI 34.09 kg/m   Specialty Comments:  No specialty comments available.  PMFS History: Patient Active Problem List   Diagnosis Date Noted  . Gangrene of lower extremity (HCC)   . Subacute osteomyelitis, left ankle and foot (  HCC)   . S/P ORIF (open reduction internal fixation) fracture 04/03/2018  . Wound infection 04/02/2018  . MRSA (methicillin resistant staph aureus) culture positive 04/02/2018  . Acute blood loss anemia 04/02/2018  . Closed left ankle fracture, with delayed healing, subsequent encounter 03/31/2018  . Essential hypertension, benign 06/23/2014  . Dyslipidemia 06/23/2014  . GERD (gastroesophageal reflux disease) 06/23/2014  . UTI (urinary tract infection) 06/23/2014  . Depression 06/23/2014   Past Medical History:  Diagnosis Date  . Anemia    vitamin b 12 deficiency anemia  . 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 Chronic venous insufficiency   . Depression   . Difficulty in walking   . Displaced trimalleolar fracture of left lower leg   . Essential (primary) hypertension   . GERD (gastroesophageal reflux disease)   . History of blood transfusion    02/2018   . History of falling   . Hyperlipidemia   . Hyperlipidemia   . Hypertension   . 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 Iron deficiency   . Major depressive disorder   . Muscle weakness (generalized)   . Myocardial infarction (HCC)    nonstemi mi - 02/19/2018 at [facility] after ankle fracture   . Nutritional deficiency, unspecified   . Obesity   . Other lack of coordination   . Unspecified atrial fibrillation (HCC)   . Unspecified osteoarthritis, unspecified site     History reviewed. No pertinent family history.  Past Surgical History:  Procedure Laterality Date  . AMPUTATION Left 04/10/2018   Procedure: LEFT BELOW KNEE AMPUTATION;  Surgeon: Nadara Mustard, MD;  Location: [facility] OR;  Service: Orthopedics;  Laterality: Left;  . APPENDECTOMY    . APPLICATION OF WOUND VAC Left 03/31/2018   Procedure: APPLICATION OF WOUND VAC;  Surgeon: Sheral Apley, MD;  Location: [facility] ORS;  Service: Orthopedics;  Laterality: Left;  . HARDWARE REMOVAL Left 03/31/2018   Procedure: HARDWARE REMOVAL;  Surgeon: Sheral Apley, MD;  Location: [facility] ORS;  Service: Orthopedics;  Laterality: Left;  . IRRIGATION AND DEBRIDEMENT FOOT Left 03/31/2018   Procedure: IRRIGATION AND DEBRIDEMENT OF LEFT ANKLE;  Surgeon: Sheral Apley, MD;  Location: [facility] ORS;  Service: Orthopedics;  Laterality: Left;  . ORIF ANKLE FRACTURE Right 06/13/2014   Procedure: OPEN REDUCTION INTERNAL FIXATION (ORIF) BIMALLEOLAR ANKLE FRACTURE;  Surgeon: Loreta Ave, MD;  Location[facility] OR;  Service: Orthopedics;  Laterality: Right;   Social History    Occupational History  . Not on file  Tobacco Use  . Smoking status: Never Smoker  . Smokeless tobacco: Never Used  Substance and Sexual Activity  . Alcohol use: No  . Drug use: No  . Sexual activity: Not Currently

## 2018-04-28 ENCOUNTER — Ambulatory Visit (INDEPENDENT_AMBULATORY_CARE_PROVIDER_SITE_OTHER): Payer: Medicare Other | Admitting: Physician Assistant

## 2018-04-28 ENCOUNTER — Encounter (INDEPENDENT_AMBULATORY_CARE_PROVIDER_SITE_OTHER): Payer: Self-pay | Admitting: Physician Assistant

## 2018-04-28 ENCOUNTER — Telehealth: Payer: Self-pay | Admitting: Cardiology

## 2018-04-28 VITALS — Ht 66.0 in | Wt 211.2 lb

## 2018-04-28 DIAGNOSIS — Z89512 Acquired absence of left leg below knee: Secondary | ICD-10-CM

## 2018-04-28 NOTE — Progress Notes (Signed)
Office Visit Note   Patient: Ana Franco           Date of Birth: 1926/01/20           MRN: [id number] Visit Date: 04/28/2018              Requested by: Gaspar Garbe, MD [address] [place], [place] [address] PCP: Wylene Simmer Adelfa Koh, MD  Chief Complaint  Patient presents with  . Left Leg - Routine Post Op, Follow-up      HPI: Patient is a 84 year old female who was seen for postoperative follow-up following a left transtibial amputation on 04/10/2018.  She is residing at [facility], but comes in with her family today.  She has some discomfort in the amputation site but overall reports little pain is occasionally taking a Tylenol.  She is working with physical therapy at the facility.  She is got some increased redness about the distal stump.  Overall her swelling is much improved.  She is having a lot more phantom pain this week and reports that it is keeping her awake at night.  We discussed starting a trial of gabapentin and the patient and family are willing to do this.  We can start at a lower dose given the patient's advanced age and multiple comorbidities.  Assessment & Plan: Visit Diagnoses:  1. 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  Oral 6 going to start some doxycycline 100 mg p.o. twice daily.  We are going to start some gabapentin 100 mg p.o. 3 times daily for her phantom pains which have become more prominent for the patient.  This can be gradually increased if the patient tolerates this well.  We will plan to harvest sutures and staples next visit.  The patient was given a prescription for biotech to work on getting a K2 prosthesis as well as as prosthesis supplies and more immediately stump shrinker stocking.  She will likely be a limited  community ambulator with the potential for ambulation on low level environmental barriers such as curbs stairs or uneven surfaces.  Follow-Up Instructions: Return in about 1 week (around 05/05/2018).   Ortho Exam  Patient is alert, oriented, no adenopathy, well-dressed, normal affect, normal respiratory effort. The patient presents at the wheelchair level.  The left transtibial amputation incision is dry and intact.  She does have some increased erythema about the inferior flap but the edema is much improved.  Her sutures and staples are intact.  Imaging: No results found. No images are attached to the encounter.  Labs: Lab Results  Component Value Date   REPTSTATUS 04/05/2018 FINAL 03/31/2018   GRAMSTAIN  03/31/2018    NO WBC SEEN RARE GRAM POSITIVE COCCI IN PAIRS Gram Stain Report Called to,Read Back By and Verified With: T.GREEN AT 1450 ON 03/31/18 BY N.THOMPSON Performed at [facility], [address] [place], [place] [address]    CULT  03/31/2018    FEW METHICILLIN RESISTANT STAPHYLOCOCCUS AUREUS NO ANAEROBES ISOLATED Performed at [facility], [address] [place], [place] [address]    LABORGA METHICILLIN RESISTANT STAPHYLOCOCCUS AUREUS 03/31/2018     Lab Results  Component Value Date   ALBUMIN 2.7 (L) 04/04/2018   ALBUMIN 3.4 (L) 06/12/2014  Body mass index is 34.09 kg/m.  Orders:  No orders of the defined types were placed in this encounter.  No orders of the defined types were placed in this encounter.    Procedures: No procedures performed  Clinical Data: No additional findings.  ROS:  All other systems negative, except as noted in the HPI. Review of Systems  Objective: Vital Signs: Ht 5\' 6"  (1.676 m)   Wt 211 lb 3.2 oz (95.8 kg)   BMI 34.09 kg/m   Specialty Comments:  No specialty comments available.  PMFS History: Patient Active Problem List   Diagnosis Date Noted  . Gangrene of lower extremity (HCC)   .  Subacute osteomyelitis, left ankle and foot (HCC)   . S/P ORIF (open reduction internal fixation) fracture 04/03/2018  . Wound infection 04/02/2018  . MRSA (methicillin resistant staph aureus) culture positive 04/02/2018  . Acute blood loss anemia 04/02/2018  . Closed left ankle fracture, with delayed healing, subsequent encounter 03/31/2018  . Essential hypertension, benign 06/23/2014  . Dyslipidemia 06/23/2014  . GERD (gastroesophageal reflux disease) 06/23/2014  . UTI (urinary tract infection) 06/23/2014  . Depression 06/23/2014   Past Medical History:  Diagnosis Date  . Anemia    vitamin b 12 deficiency anemia  . 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 Chronic venous insufficiency   . Depression   . Difficulty in walking   . Displaced trimalleolar fracture of left lower leg   . Essential (primary) hypertension   . GERD (gastroesophageal reflux disease)   . History of blood transfusion    02/2018   . History of falling   . Hyperlipidemia   . Hyperlipidemia   . Hypertension   . 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 Iron deficiency   . Major depressive disorder   . Muscle weakness (generalized)   . Myocardial infarction (HCC)    nonstemi mi [facility]   . Nutritional deficiency, unspecified   . Obesity   . Other lack of coordination   . Unspecified atrial fibrillation (HCC)   . Unspecified osteoarthritis, unspecified site     History reviewed. No pertinent family history.  Past Surgical History:  Procedure Laterality Date  . AMPUTATION Left 04/10/2018   Procedure: LEFT BELOW KNEE AMPUTATION;  Surgeon: Nadara Mustarduda, Marcus V, MD;  [facility];  Service: Orthopedics;  Laterality: Left;  . APPENDECTOMY    . APPLICATION OF WOUND VAC Left 03/31/2018   Procedure: APPLICATION OF WOUND VAC;  Surgeon:  Sheral ApleyMurphy, Timothy D, MD;  [facility];  Service: Orthopedics;  Laterality: Left;  . HARDWARE REMOVAL Left 03/31/2018   Procedure: HARDWARE REMOVAL;  Surgeon: Sheral ApleyMurphy, Timothy D, MD;  Location: [facility];  Service: Orthopedics;  Laterality: Left;  . IRRIGATION AND DEBRIDEMENT FOOT Left 03/31/2018   Procedure: IRRIGATION AND DEBRIDEMENT OF LEFT ANKLE;  Surgeon: Sheral ApleyMurphy, Timothy D, MD;  Location: [facility];  Service: Orthopedics;  Laterality: Left;  . ORIF ANKLE FRACTURE Right 06/13/2014   Procedure: OPEN REDUCTION INTERNAL FIXATION (ORIF) BIMALLEOLAR ANKLE FRACTURE;  Surgeon: Loreta Aveaniel F Murphy, MD;  [facility];  Service: Orthopedics;  Laterality: Right;   Social History   Occupational History  . Not on file  Tobacco Use  . Smoking status: Never Smoker  . Smokeless tobacco: Never Used  Substance and Sexual Activity  . Alcohol use: No  .  Drug use: No  . Sexual activity: Not Currently

## 2018-04-28 NOTE — Progress Notes (Signed)
(updated)PROGRESS NOTE  Ana Franco RUE:[id number] DOB: [birth year] DOA: 04/02/2018 PCP: Gaspar Garbe, MD  HPI/Brief Narrative  Ana Franco is a 84 y.o. year old female with medical history significant for nonhealing surgical wound status post ORIF of left ankle (02/19/2018) with eventual hardware removal on 03/31/2018, HTN, HLD, A. fib not on anticoagulation, anemia, depression, GERD.[name] Recently here with wound dehiscence from ORIF with exposed hardware and underwent hardware removal on 03/31/2018 by Dr.Timothy Eulah Pont and discharged 04/01/18 on doxycycline for 2 weeks to rehab facility. She was seen at [facility] for follow up on 04/01/18 and found to have  Patient was directly admitted on 8/22 after intraoperative cultures resulted with MRSA resistant to doxycycline and started empirically on vancomycin.   Patient was originally planning on discharging to SNF on 04/05/18. This progress note entered late as previously discharge summary was pended for this encounter but no discharge as there was no available bed on 04/05/18  Subjective No acute complaints .  Assessment/Plan: Wound dehiscence/ MRSA infection of left ankle(previous ORIF on 02/2018) status post hardware removal 03/31/2018.  Continue IV vancomycin for total of 6 weeks, end date 05/14/2018 as directed by infectious disease consultants due to concern for early osteomyelitis.  Outpatient antibiotic therapy arranged x-ray on 8/24 shows interval development of left fibular fraction.  Patient remains afebrile with no signs of sepsis.  Ortho and infectious disease following.  PICC line placed on 8/24.    Chronic anemia, stable.  Hemoglobin back at baseline iron deficiency anemia, suspect some acute changes changes related to blood loss from recent procedure, on iron continue.  Paroxysmal atrial fibrillation, rate controlled.  Currently normal sinus rhythm.  Not on anticoagulation due to fall risk.  Hypertension, elevated  currently with SBP's in 160s, increased to amlodipine 10 mg.  Continue home metoprolol 100 mg a.m., 25 mg p.m., losartan 100 mg daily and amlodipine 5 mg.  Closely monitor  CKD, stage III, creatinine stable at baseline.  Monitor BMP.  Avoid nephrotoxic medications  Code Status: Full   Family Communication: no family at bedside   Disposition Plan: Patient was originally planning on discharging to SNF on 04/05/18. This progress note entered late as previously discharge summary was pended for this encounter but no discharge as there was no available bed on 04/05/18    Consultants:   Orthopedics, Infectious Disease  Procedures:  none   Antimicrobials: Anti-infectives (From admission, onward)   Start     Dose/Rate Route Frequency Ordered Stop   04/12/18 0900  vancomycin (VANCOCIN) 1,250 mg in sodium chloride 0.9 % 250 mL IVPB  Status:  Discontinued     1,250 mg 166.7 mL/hr over 90 Minutes Intravenous Every 24 hours 04/12/18 0815 04/13/18 1035   04/10/18 0700  ceFAZolin (ANCEF) IVPB 2g/100 mL premix     2 g 200 mL/hr over 30 Minutes Intravenous To [facility] 04/08/18 0519 04/10/18 0750   04/07/18 0600  vancomycin (VANCOCIN) 1,250 mg in sodium chloride 0.9 % 250 mL IVPB     1,250 mg 166.7 mL/hr over 90 Minutes Intravenous Every 24 hours 04/06/18 2104 04/11/18 0714   04/06/18 0000  vancomycin IVPB  Status:  Discontinued     1,500 mg Intravenous Every 24 hours 04/06/18 1456 04/10/18    04/05/18 0000  vancomycin IVPB  Status:  Discontinued     1,500 mg Intravenous Every 24 hours 04/05/18 1419 04/06/18    04/03/18 2100  vancomycin (VANCOCIN) 1,250 mg in sodium chloride 0.9 %  250 mL IVPB  Status:  Discontinued     1,250 mg 166.7 mL/hr over 90 Minutes Intravenous Every 24 hours 04/02/18 1956 04/03/18 0912   04/03/18 2100  vancomycin (VANCOCIN) 1,500 mg in sodium chloride 0.9 % 500 mL IVPB  Status:  Discontinued     1,500 mg 250 mL/hr over 120 Minutes Intravenous Every 24 hours  04/03/18 0912 04/06/18 2104   04/02/18 1930  vancomycin (VANCOCIN) 1,250 mg in sodium chloride 0.9 % 250 mL IVPB     1,250 mg 166.7 mL/hr over 90 Minutes Intravenous  Once 04/02/18 1919 04/02/18 2340        Cultures:  none  Telemetry: no  DVT prophylaxis: SCDs   Objective: Vitals:   04/14/18 0811 04/14/18 1403 04/14/18 1739 04/14/18 2045  BP: (!) 148/81 (!) 132/52 (!) 149/64 (!) 153/65  Pulse: 71 60 68 66  Resp:  20 18 18   Temp:  98.3 F (36.8 C) 99.2 F (37.3 C) 99 F (37.2 C)  TempSrc:  Oral Axillary Oral  SpO2:  98% 96% 97%  Weight:      Height:       No intake or output data in the 24 hours ending 04/28/18 0751 Filed Weights   04/02/18 1831  Weight: 95.8 kg    Exam:  Elderly female lying in bed comfortably, no distress EOMI, anicteric sclera Normal dentition, moist oral mucosa Wound VAC in place on left leg, brace on left leg, No focal neurologic deficits Alert and oriented x3 Normal affect, appropriate mood  Data Reviewed: CBC: No results for input(s): WBC, NEUTROABS, HGB, HCT, MCV, PLT in the last 168 hours. Basic Metabolic Panel: No results for input(s): NA, K, CL, CO2, GLUCOSE, BUN, CREATININE, CALCIUM, MG, PHOS in the last 168 hours. GFR: Estimated Creatinine Clearance: 32.1 mL/min (A) (by C-G formula based on SCr of 1.36 mg/dL (H)). Liver Function Tests: No results for input(s): AST, ALT, ALKPHOS, BILITOT, PROT, ALBUMIN in the last 168 hours. No results for input(s): LIPASE, AMYLASE in the last 168 hours. No results for input(s): AMMONIA in the last 168 hours. Coagulation Profile: No results for input(s): INR, PROTIME in the last 168 hours. Cardiac Enzymes: No results for input(s): CKTOTAL, CKMB, CKMBINDEX, TROPONINI in the last 168 hours. BNP (last 3 results) No results for input(s): PROBNP in the last 8760 hours. HbA1C: No results for input(s): HGBA1C in the last 72 hours. CBG: No results for input(s): GLUCAP in the last 168  hours. Lipid Profile: No results for input(s): CHOL, HDL, LDLCALC, TRIG, CHOLHDL, LDLDIRECT in the last 72 hours. Thyroid Function Tests: No results for input(s): TSH, T4TOTAL, FREET4, T3FREE, THYROIDAB in the last 72 hours. Anemia Panel: No results for input(s): VITAMINB12, FOLATE, FERRITIN, TIBC, IRON, RETICCTPCT in the last 72 hours. Urine analysis:    Component Value Date/Time   COLORURINE YELLOW 06/13/2014 0018   APPEARANCEUR CLOUDY (A) 06/13/2014 0018   LABSPEC 1.018 06/13/2014 0018   PHURINE 5.0 06/13/2014 0018   GLUCOSEU NEGATIVE 06/13/2014 0018   HGBUR MODERATE (A) 06/13/2014 0018   BILIRUBINUR NEGATIVE 06/13/2014 0018   KETONESUR NEGATIVE 06/13/2014 0018   PROTEINUR NEGATIVE 06/13/2014 0018   UROBILINOGEN 0.2 06/13/2014 0018   NITRITE POSITIVE (A) 06/13/2014 0018   LEUKOCYTESUR LARGE (A) 06/13/2014 0018   Sepsis Labs: @LABRCNTIP (procalcitonin:4,lacticidven:4)  ) No results found for this or any previous visit (from the past 240 hour(s)).    Studies: No results found.  Scheduled Meds:   Continuous Infusions:    LOS: 12 days  Laverna Peace, MD [facility] Pager [phone]  If 7PM-7AM, please contact night-coverage [email or web address] Password [email or web address] 04/28/2018, 7:51 AM

## 2018-04-28 NOTE — Telephone Encounter (Signed)
[facility], Appt 05/04/18 @ 3:20PM.[place]

## 2018-04-29 ENCOUNTER — Encounter (INDEPENDENT_AMBULATORY_CARE_PROVIDER_SITE_OTHER): Payer: Self-pay | Admitting: Physician Assistant

## 2018-04-29 ENCOUNTER — Telehealth (INDEPENDENT_AMBULATORY_CARE_PROVIDER_SITE_OTHER): Payer: Self-pay | Admitting: Physician Assistant

## 2018-04-29 NOTE — Telephone Encounter (Signed)
Z89.512, L08.9 IC NA at work number, NA cell, cannot leave message, no VM

## 2018-04-29 NOTE — Telephone Encounter (Signed)
Wynonia MustyLisa Love-nurse manager [facility] called left voicemail message stating she need ICD-10 code to put in her system for the Doxycycline prescribed for the patient. The number to contact Misty StanleyLisa is [phone] or [phone] cell

## 2018-04-29 NOTE — Telephone Encounter (Signed)
Hoy RegisterLisa Love called back. Unaware someone called. Please call her back before 5 on work #

## 2018-05-04 ENCOUNTER — Encounter: Payer: Self-pay | Admitting: Cardiology

## 2018-05-04 ENCOUNTER — Ambulatory Visit (INDEPENDENT_AMBULATORY_CARE_PROVIDER_SITE_OTHER): Payer: Medicare Other | Admitting: Cardiology

## 2018-05-04 VITALS — BP 134/68 | HR 74

## 2018-05-04 DIAGNOSIS — I10 Essential (primary) hypertension: Secondary | ICD-10-CM

## 2018-05-04 DIAGNOSIS — I252 Old myocardial infarction: Secondary | ICD-10-CM

## 2018-05-04 DIAGNOSIS — I480 Paroxysmal atrial fibrillation: Secondary | ICD-10-CM | POA: Diagnosis not present

## 2018-05-04 MED ORDER — ASPIRIN 81 MG PO TBEC: 81.0000 mg | DELAYED_RELEASE_TABLET | Freq: Every day | ORAL | Status: DC

## 2018-05-04 NOTE — Patient Instructions (Signed)
Medication Instructions: Your Physician recommend you make the following changes to your medication. Decrease: Aspirin to 81 mg daily   If you need a refill on your cardiac medications before your next appointment, please call your pharmacy.   Labwork: None  Procedures/Testing: None  Follow-Up: Your physician wants you to follow-up as needed with Dr. Cristal Deerhristopher. Call our office at [phone] if you have any additional questions.   Special Instructions:    Thank you [facility]!!

## 2018-05-04 NOTE — Progress Notes (Signed)
Cardiology Office Note:    Date:  05/04/2018   ID:  Saara Kijowski, DOB 1925/11/19, MRN [id number]  PCP:  Gaspar Garbe, MD  Cardiologist:  Jodelle Red, MD PhD  Referring MD: No ref. provider found   CC: post hospital follow up  History of Present Illness:    Ana Franco is a 84 y.o. female with a hx of hypertension, hyperlipidemia, recent amputation (transtibial on the left) due to poor wound health who is seen as a new patient for the evaluation and management of hypertension.  Patient's prior health history reviewed. She is followed by Dr. Wylene Simmer, though she reports not seeing him since her incident with a fall in 02/2018. Per history and the chart, she suffered a fall while staying at the beach in [place] in 02/2018. She had an ankle fracture, which was treated with open reduction and internal fixation. Unfortunately, her course was complicated by troponin elevation consistent with NSTEMI; did not pursue cardiac catheterization. Did have echocardiogram, which patient and her son report as normal. Unfortunately she had complications with resistant MRSA infection. She had hardware removal on 03/31/18, and ultimately she underwent transtibial amputation on 04/10/18.  She also has a history of anemia, with transfusion during recent hospitalization. She has a history of paroxysmal atrial fibrillation but was not anticoagulated in the past due to fall risk. Follows with Dr. Wylene Simmer, has been on full dose aspirin per his recommendation for some time (at least 2015). No history of CVA. Unclear if full dose ASA was in place of anticoagulation for atrial fibrillation.  Has noted elevated BP in the morning but improves later in the day. No headaches, blurry vision. Has indigestion that improves with belching. Has noticed wheezing in the morning since having the surgery, might have been doing some wheezing prior to surgery.  Records review from [facility] and faxed records from  [facility]: Presented on 02/19/18 after fall, had left sided open displaced tibiotalar fracture and dislocation. Had incision & drainage with ORIF. Overnight 7/12 had chest discomfort, found to have atrial fibrillation. Peak troponin I was 4.14. Echo copied below. Decision was made to forgo cardiac catheterization and manage medically.  Past Medical History:  Diagnosis Date  . Anemia    vitamin b 12 deficiency anemia  . 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 Chronic venous insufficiency   . Depression   . Difficulty in walking   . Displaced trimalleolar fracture of left lower leg   . Essential (primary) hypertension   . GERD (gastroesophageal reflux disease)   . History of blood transfusion    02/2018   . History of falling   . Hyperlipidemia   . Hyperlipidemia   . Hypertension   . 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 Iron deficiency   . Major depressive disorder   . Muscle weakness (generalized)   . Myocardial infarction (HCC)    nonstemi mi - 02/19/2018 at [facility] after ankle fracture   . Nutritional deficiency, unspecified   . Obesity   . Other lack of coordination   . Unspecified atrial fibrillation (HCC)   . Unspecified osteoarthritis, unspecified site     Past Surgical History:  Procedure Laterality Date  . AMPUTATION Left 04/10/2018   Procedure: LEFT BELOW KNEE AMPUTATION;  Surgeon: Nadara Mustard, MD;  Location: [facility] OR;  Service: Orthopedics;  Laterality:  Left;  . APPENDECTOMY    . APPLICATION OF WOUND VAC Left 03/31/2018   Procedure: APPLICATION OF WOUND VAC;  Surgeon: Sheral Apley, MD;  Location: [facility] ORS;  Service: Orthopedics;  Laterality: Left;  . HARDWARE REMOVAL Left 03/31/2018   Procedure: HARDWARE REMOVAL;  Surgeon: Sheral Apley, MD;  Location: [facility];  Service: Orthopedics;  Laterality:  Left;  . IRRIGATION AND DEBRIDEMENT FOOT Left 03/31/2018   Procedure: IRRIGATION AND DEBRIDEMENT OF LEFT ANKLE;  Surgeon: Sheral Apley, MD;  Location: [facility];  Service: Orthopedics;  Laterality: Left;  . ORIF ANKLE FRACTURE Right 06/13/2014   Procedure: OPEN REDUCTION INTERNAL FIXATION (ORIF) BIMALLEOLAR ANKLE FRACTURE;  Surgeon: Loreta Ave, MD;  Location: [facility] OR;  Service: Orthopedics;  Laterality: Right;    Current Medications: Current Outpatient Medications on File Prior to Visit  Medication Sig  . acetaminophen (TYLENOL) 325 MG tablet Take 325 mg by mouth every 6 (six) hours as needed for headache (pain).  [name] acetaminophen (TYLENOL) 500 MG tablet Take 500 mg by mouth at bedtime.   [name] amLODipine (NORVASC) 5 MG tablet Take 2 tablets (10 mg total) by mouth daily.  . beta carotene w/minerals (OCUVITE) tablet Take 1 tablet by mouth daily.   . Calcium Carbonate-Vitamin D 600-400 MG-UNIT chew tablet Chew 1 tablet by mouth daily.   . citalopram (CELEXA) 20 MG tablet Take 20 mg by mouth daily.  [name] docusate sodium (COLACE) 100 MG capsule Take 1 capsule (100 mg total) by mouth 2 (two) times daily.  [name] esomeprazole (NEXIUM) 40 MG capsule Take 40 mg by mouth daily.   [name] ezetimibe-simvastatin (VYTORIN) 10-40 MG per tablet Take 1 tablet by mouth at bedtime.  . ferrous sulfate 325 (65 FE) MG tablet Take 325 mg by mouth 2 (two) times daily with a meal.  . gabapentin (NEURONTIN) 100 MG capsule Take 100 mg by mouth 3 (three) times daily.  . metoprolol tartrate (LOPRESSOR) 50 MG tablet Take 50 mg by mouth 2 (two) times daily.   . Multiple Vitamins-Minerals (DECUBI-VITE PO) Take 1 capsule by mouth daily.   . nitroGLYCERIN (NITROSTAT) 0.4 MG SL tablet Place 0.4 mg under the tongue every 5 (five) minutes as needed for chest pain.  [name] ondansetron (ZOFRAN) 4 MG tablet Take 1 tablet (4 mg total) by mouth every 8 (eight) hours as needed for nausea or vomiting.  . polyethylene glycol (MIRALAX / GLYCOLAX) packet  Take 17 g by mouth 2 (two) times daily.  . vitamin B-12 (CYANOCOBALAMIN) 1000 MCG tablet Take 1,000 mcg by mouth daily.   No current facility-administered medications on file prior to visit.      Allergies:   Patient has no known allergies.   Social History   Socioeconomic History  . Marital status: Widowed    Spouse name: Not on file  . Number of children: Not on file  . Years of education: Not on file  . Highest education level: Not on file  Occupational History  . Not on file  Social Needs  . Financial resource strain: Not on file  . Food insecurity:    Worry: Patient refused    Inability: Patient refused  . Transportation needs:    Medical: Patient refused    Non-medical: Patient refused  Tobacco Use  . Smoking status: Never Smoker  . Smokeless tobacco: Never Used  Substance and Sexual Activity  . Alcohol use: No  . Drug use: No  . Sexual activity: Not Currently  Lifestyle  .  Physical activity:    Days per week: Not on file    Minutes per session: Not on file  . Stress: Not on file  Relationships  . Social connections:    Talks on phone: Not on file    Gets together: Not on file    Attends religious service: Not on file    Active member of club or organization: Not on file    Attends meetings of clubs or organizations: Not on file    Relationship status: Not on file  Other Topics Concern  . Not on file  Social History Narrative  . Not on file     Family History: No significant family history of cardiac disease.  ROS:   Please see the history of present illness.  Additional pertinent ROS: Review of Systems  Constitutional: Negative for chills, fever and malaise/fatigue.  HENT: Negative for ear pain and hearing loss.   Eyes: Negative for blurred vision and pain.  Respiratory: Negative for cough and shortness of breath.   Cardiovascular: Positive for leg swelling. Negative for chest pain, palpitations, orthopnea, claudication and PND.  Gastrointestinal:  Negative for abdominal pain, blood in stool and melena.  Genitourinary: Negative for dysuria and hematuria.  Musculoskeletal: Positive for falls and joint pain.  Skin: Negative for rash.  Neurological: Negative for speech change and loss of consciousness.  Endo/Heme/Allergies: Bruises/bleeds easily.   EKGs/Labs/Other Studies Reviewed:    The following studies were reviewed today: 02/21/18 Findings  Mitral Valve  There is moderate mitral annular calcification with mild thickening of the  mitral valve leaflets. Moderate mitral regurgitation is present.  Aortic Valve  Mild sclerosis of the trileaflet aortic valve, with adequate leaflet  motion. Trivial aortic regurgitation is present.  Tricuspid Valve  The tricuspid valve appears normal. Moderate tricuspid regurgitation is  seen. The pulmonary artery pressure could not be estimated from the TR jet  due to technical limitations.  Pulmonic Valve  The pulmonary valve leaflets appear thickened. Trivial pulmonary  regurgitation is seen.  Left Atrium  The left atrium is severely enlarged. LA ESV index is 59.58ml/m2.  Left Ventricle  There is mild concentric left ventricular hypertrophy seen. There is  normal left ventricular size. Global left ventricular systolic function is  normal. The estimated left ventricular ejection fraction is ~59%. No  segmental wall motion abnormalities are appreciated.  Pseudonormal filling, grade 2 diastolic dysfunction.  Right Atrium  The right atrium is mildly enlarged.  Right Ventricle  There is normal right ventricular size and function.  Pericardium  No pericardial effusion or thickening is seen.  Interatrial Septum  The interatrial septum is not well visualized, but appears to be grossly  intact.  Miscellaneous  The aortic root is normal in size.  Conclusions  Summary  There is mild concentric left ventricular hypertrophy seen. There is  normal left ventricular  size. Global left ventricular systolic function is  normal. The estimated left ventricular ejection fraction is ~59%. No  segmental wall motion abnormalities are appreciated.  The left atrium is severely enlarged. LA ESV index is 59.67ml/m2.The right  atrium is mildly enlarged.  There is moderate mitral annular calcification with mild thickening of the  mitral valve leaflets. Moderate mitral regurgitation is present.  Mild sclerosis of the trileaflet aortic valve, with adequate leaflet  motion. Trivial aortic regurgitation is present.  Moderate tricuspid regurgitation is seen. The pulmonary artery pressure  could not be estimated from the TR jet due to technical limitations.  EKG:  EKG is  ordered today.  The ekg ordered today demonstrates atrial fibrillation, heart rate 74 bpm.  Recent Labs: 04/03/2018: Magnesium 1.8 04/04/2018: ALT 10 04/14/2018: BUN 18; Creatinine, Ser 1.36; Hemoglobin 8.8; Platelets 229; Potassium 4.4; Sodium 136  Recent Lipid Panel No results found for: CHOL, TRIG, HDL, CHOLHDL, VLDL, LDLCALC, LDLDIRECT  Physical Exam:    VS:  BP 134/68 (BP Location: Right Arm, Patient Position: Sitting, Cuff Size: Normal)   Pulse 74     Wt Readings from Last 3 Encounters:  04/28/18 211 lb 3.2 oz (95.8 kg)  04/21/18 211 lb 3.2 oz (95.8 kg)  04/02/18 211 lb 3.2 oz (95.8 kg)     GEN: Well nourished, well developed in no acute distress HEENT: Normal NECK: No JVD; No carotid bruits LYMPHATICS: No lymphadenopathy CARDIAC: regular rhythm, normal S1 and S2, no rubs, gallops. 2/6 holosystolic murmur throughout precordium. Radial pulses 2+ bilaterally. RESPIRATORY:  Clear to auscultation without rales, wheezing or rhonchi  ABDOMEN: Soft, non-tender, non-distended MUSCULOSKELETAL:  Left transtibial amputation site wrapped with 1+ edema above wound but no warmth or erythema. RLE with trace pitting edema. SKIN: Warm and dry NEUROLOGIC:  Alert and oriented x 3 PSYCHIATRIC:   Normal affect   ASSESSMENT:    1. History of MI (myocardial infarction)   2. Essential hypertension, benign   3. Paroxysmal atrial fibrillation (HCC)    PLAN:    1. History of recent NSTEMI, medically managed. S/p recent ankle fracture, infection, and amputation. Denies any active chest pain. Based on guidelines, if this was considered type I ACS, should be on dual antiplatelet therapy with aspirin and clopidogrel. However, occurred postoperatively, resolved, no wall motion abnormalities. Also has had recent falls, recent surgery. Was on 325 mg aspirin chronically. Already on a statin  -with no CVA history, will lower to aspirin 81 mg daily from aspirin 325 mg.  -while I do not have full account, with episode of tachycardia (question as to whether it was afib or SVT) and limited symptoms, could consider type II NSTEMI. Troponin did have a high peak of 4, which is more consistent with type I event.  -she has had recent surgery, recent falls, and anemia. Given this, risk vs benefit of DAPT is complex. Will continue with 81 mg aspirin and statin. On a beta blocker as well.  -she states that she and her PCP previously declined anticoagulation for atrial fibrillation (see below). Full dose aspirin is not a replacement for anticoagulation. She wishes to discuss with her PCP, but her risk of stroke is high. It is a complex decision to anticoagulate or not. With recent falls, recent surgery, and anemia, plan is to defer anticoagulation for now. However, if she stabilizes without further falls, would consider apixaban. She is over 80, and her creatinine is borderline--if her creatinine is greater than 1.5, would do low dose apixaban for her. She plans to follow up with her PCP in the near future and discuss further.  -could consider stress test to determine if there is ischemia; however, would need to determine if a positive stress test would then result in cath and possible PCI. If not, would continue with  medical management. They are not interested in coronary angiography today.  2. Hypertension: at goal today.  3. Paroxysmal atrial fibrillation CHA2DS2/VAS Stroke Risk Points =   5    Points Metrics  0 Has Congestive Heart Failure:  No   1 Has Vascular Disease:  Yes   1 Has Hypertension:  Yes   2  Age:  [age]   0 Has Diabetes:  No   0 Had Stroke:  No  Had TIA:  No  Had thromboembolism:  No   1 Female:  Yes    High stroke risk, as above, but also high fall risk. History of anemia as well. Wishes to discuss with her PCP, as above. On metoprolol for rate control.   Documentation of Prolonged Non Face-To-Face Time (> 31 minutes) I personally reviewed prior medical records (both internal and external) for this encounter.  This included review of historical hospital records, office notes, cardiac studies (e.g. Echocardiograms, Stress Tests, Cardiac Catheterizations, Event Monitors, etc), radiologic studies (e.g. MRIs, CTs, Xrays, etc), laboratory data, etc.  The pertinent findings are outlined in my note.  The total non face to face time spent for record review was 40 minutes.  This does not include the review of my own personal notes and records.  Plan for follow up: she has followed with her PCP for her afib for many years. I would be happy to assist with any further decision making, but she would like to discuss her management with her PCP. She is welcome to follow up any time as needed.  Medication Adjustments/Labs and Tests Ordered: Current medicines are reviewed at length with the patient today.  Concerns regarding medicines are outlined above.  Orders Placed This Encounter  Procedures  . EKG 12-Lead   Meds ordered this encounter  Medications  . aspirin EC 81 MG EC tablet    Sig: Take 1 tablet (81 mg total) by mouth daily.    1 tab a day for the next 30 days to prevent blood clots    Patient Instructions  Medication Instructions: Your Physician recommend you make the following changes  to your medication. Decrease: Aspirin to 81 mg daily   If you need a refill on your cardiac medications before your next appointment, please call your pharmacy.   Labwork: None  Procedures/Testing: None  Follow-Up: Your physician wants you to follow-up as needed with Dr. Cristal Deerhristopher. Call our office at [phone] if you have any additional questions.   Special Instructions:    Thank you for choosing [facility]!!       Signed, Jodelle RedBridgette Kenika Sahm, MD PhD 05/04/2018 9:10 PM    [facility]

## 2018-05-05 ENCOUNTER — Encounter (INDEPENDENT_AMBULATORY_CARE_PROVIDER_SITE_OTHER): Payer: Self-pay | Admitting: Physician Assistant

## 2018-05-05 ENCOUNTER — Ambulatory Visit (INDEPENDENT_AMBULATORY_CARE_PROVIDER_SITE_OTHER): Payer: Medicare Other | Admitting: Physician Assistant

## 2018-05-05 VITALS — Ht 66.0 in | Wt 211.2 lb

## 2018-05-05 DIAGNOSIS — Z89512 Acquired absence of left leg below knee: Secondary | ICD-10-CM

## 2018-05-05 DIAGNOSIS — E43 Unspecified severe protein-calorie malnutrition: Secondary | ICD-10-CM

## 2018-05-05 NOTE — Progress Notes (Signed)
Office Visit Note   Patient: Ana Franco           Date of Birth: 12/26/1925           MRN: [id number] Visit Date: 05/05/2018              Requested by: Gaspar Garbe, MD [address] [place], [place] [address] PCP: Wylene Simmer Adelfa Koh, MD  Chief Complaint  Patient presents with  . Left Leg - Routine Post Op    Left BKA  . Left Foot - Routine Post Op      HPI: Patient is a 84 year old female who seen for postoperative follow-up following left transtibial amputation on 04/10/2018.  She is currently at [facility] skilled nursing facility.  Her son is with her for her appointment and they are going to [employer] following this appointment for a stump shrinker stocking.  She is receiving some physical therapy at the facility.  She did start on some gabapentin for phantom pain and feels that the phantom pain is some better.  Her son reports that she had developed a cough recently and the facility did do a chest x-ray and she is going to be started on some antibiotics for treatment for suspected pneumonia.  She denies any fevers or chills.  She reports she has been the eating well.  Assessment & Plan: Visit Diagnoses:  1. 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 Severe protein-calorie malnutrition (HCC)     Plan: She can wash the left transtibial limitation with soap and water.  Recommend she get the stump shrinker stocking as soon as possible and wear it as much as possible to her left transtibial amputation.  Recommend a compression stocking for her right lower extremity and the son is going to help her acquire silver socks.  She will follow-up in 3 weeks or sooner should she have difficulties in the interim.  Follow-Up Instructions: Return in about 3 weeks (around 05/26/2018).   Ortho Exam  Patient is alert, oriented, no adenopathy, well-dressed, normal affect, normal respiratory effort. The left transtibial amputation has some minimal superficial  epidermal lysis about the lateral incision.  There is some erythema of the stump, but she is going to be started on some antibiotics for treatment of her pneumonia.  She primarily needs her shrinker stocking in the family is picking this up today.  Staples and stitches were removed today.  Imaging: No results found.   Labs: Lab Results  Component Value Date   REPTSTATUS 04/05/2018 FINAL 03/31/2018   GRAMSTAIN  03/31/2018    NO WBC SEEN RARE GRAM POSITIVE COCCI IN PAIRS Gram Stain Report Called to,Read Back By and Verified With: T.GREEN AT 1450 ON 03/31/18 BY N.THOMPSON Performed at [facility], [address], [place], [place] [address]    CULT  03/31/2018    FEW METHICILLIN RESISTANT STAPHYLOCOCCUS AUREUS NO ANAEROBES ISOLATED Performed at [facility], [address] [place], [place] [address]    LABORGA METHICILLIN RESISTANT STAPHYLOCOCCUS AUREUS 03/31/2018     Lab Results  Component Value Date   ALBUMIN 2.7 (L) 04/04/2018   ALBUMIN 3.4 (L) 06/12/2014    Body mass index is 34.09 kg/m.  Orders:  No orders of the defined types were placed in this encounter.  No orders of the defined types were placed in this encounter.    Procedures: No procedures performed  Clinical Data: No additional findings.  ROS:  All other systems  negative, except as noted in the HPI. Review of Systems  Objective: Vital Signs: Ht 5\' 6"  (1.676 m)   Wt 211 lb 3.2 oz (95.8 kg)   BMI 34.09 kg/m   Specialty Comments:  No specialty comments available.  PMFS History: Patient Active Problem List   Diagnosis Date Noted  . Paroxysmal atrial fibrillation (HCC) 05/04/2018  . Gangrene of lower extremity (HCC)   . Subacute osteomyelitis, left ankle and foot (HCC)   . S/P ORIF (open reduction internal fixation) fracture 04/03/2018  . Wound infection 04/02/2018  . MRSA (methicillin resistant staph aureus) culture positive 04/02/2018  . Acute blood loss anemia  04/02/2018  . Closed left ankle fracture, with delayed healing, subsequent encounter 03/31/2018  . Essential hypertension, benign 06/23/2014  . Dyslipidemia 06/23/2014  . GERD (gastroesophageal reflux disease) 06/23/2014  . UTI (urinary tract infection) 06/23/2014  . Depression 06/23/2014   Past Medical History:  Diagnosis Date  . Anemia    vitamin b 12 deficiency anemia  . 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 Chronic venous insufficiency   . Depression   . Difficulty in walking   . Displaced trimalleolar fracture of left lower leg   . Essential (primary) hypertension   . GERD (gastroesophageal reflux disease)   . History of blood transfusion    02/2018   . History of falling   . Hyperlipidemia   . Hyperlipidemia   . Hypertension   . 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 Iron deficiency   . Major depressive disorder   . Muscle weakness (generalized)   . Myocardial infarction (HCC)    nonstemi mi - [facility]   . Nutritional deficiency, unspecified   . Obesity   . Other lack of coordination   . Unspecified atrial fibrillation (HCC)   . Unspecified osteoarthritis, unspecified site     History reviewed. No pertinent family history.  Past Surgical History:  Procedure Laterality Date  . AMPUTATION Left 04/10/2018   Procedure: LEFT BELOW KNEE AMPUTATION;  Surgeon: Nadara Mustarduda, Marcus V, MD;  [facility];  Service: Orthopedics;  Laterality: Left;  . APPENDECTOMY    . APPLICATION OF WOUND VAC Left 03/31/2018   Procedure: APPLICATION OF WOUND VAC;  Surgeon: Sheral ApleyMurphy, Timothy D, MD;  Location: [facility];  Service: Orthopedics;  Laterality: Left;  . HARDWARE REMOVAL Left 03/31/2018   Procedure: HARDWARE REMOVAL;  Surgeon: Sheral ApleyMurphy, Timothy D, MD;  Location: [facility];  Service: Orthopedics;  Laterality: Left;  .  IRRIGATION AND DEBRIDEMENT FOOT Left 03/31/2018   Procedure: IRRIGATION AND DEBRIDEMENT OF LEFT ANKLE;  Surgeon: Sheral ApleyMurphy, Timothy D, MD;  Location: [facility];  Service: Orthopedics;  Laterality: Left;  . ORIF ANKLE FRACTURE Right 06/13/2014   Procedure: OPEN REDUCTION INTERNAL FIXATION (ORIF) BIMALLEOLAR ANKLE FRACTURE;  Surgeon: Loreta Aveaniel F Murphy, MD;  [facility];  Service: Orthopedics;  Laterality: Right;   Social History   Occupational History  . Not on file  Tobacco Use  . Smoking status: Never Smoker  . Smokeless tobacco: Never Used  Substance and Sexual Activity  . Alcohol use: No  . Drug use: No  . Sexual activity: Not Currently

## 2018-05-06 ENCOUNTER — Telehealth: Payer: Self-pay | Admitting: Behavioral Health

## 2018-05-06 NOTE — Telephone Encounter (Signed)
Patient's son [name] called this AM and inquired if it was medically necessary for his mother to have a hospital follow up appointment with Dr. Orvan Falconer 05/14/2018.  Dr. Orvan Falconer saw patient in the hospital when she had an infected foot.  The foot has since been removed and Mr. Pankonin states his mother is doing much better but hard to transport.  He just wanted to make sure it was medically necessary for her to come to this appointment.  Informed him yes Dr. Orvan Falconer needed to see Ana Franco to be able to determine if she was indeed better.  Informed him would reach back out to him if Dr. Orvan Falconer thinks otherwise. Angeline Slim RN

## 2018-05-06 NOTE — Telephone Encounter (Signed)
Spoke to the son of Camera Krienke, confirmed with him that Ms. Lagos has completed IV antibiotic therapy and her PICC line has been removed.  He states they saw orthopaedics yesterday and was told she was doing well. Explained that originally Dr. Blair Dolphin plan was to treat her with IV antibiotics for an ankle infection for 6 weeks.   Informed him per Dr. Orvan Falconer, Hospital follow up appointment can be cancelled since he BKA she had done was likely a curative procedure.   Brett Canales verbalized understanding and was appreciative of the call.  Per Orthopedic note Ms. Vickers will possibly be started on oral antibiotics for suspected Pneumonia. Angeline Slim RN

## 2018-05-06 NOTE — Telephone Encounter (Signed)
When I signed off of her case on 04/06/2018 the plan was to treat her with 6 weeks for ankle infection.  However she underwent BKA on 04/10/2018 which is likely to have been a curative procedure.  As long as she is doing well and without any signs of infection there is no need for her to have a follow-up visit here.  However, please see if she has completed her IV therapy and whether or not her PICC has been removed.

## 2018-05-11 ENCOUNTER — Telehealth (INDEPENDENT_AMBULATORY_CARE_PROVIDER_SITE_OTHER): Payer: Self-pay | Admitting: Orthopedic Surgery

## 2018-05-11 NOTE — Telephone Encounter (Signed)
Patient son came into the office in  need of a prescription for a lyft chair for patient. Patient son is currently sitting in the waiting room waiting to speak with someone. I advised patients son to have a seat and someone will  be with him when available.

## 2018-05-11 NOTE — Telephone Encounter (Signed)
Lift chair Rx given to patients son

## 2018-05-14 ENCOUNTER — Inpatient Hospital Stay: Payer: Self-pay | Admitting: Internal Medicine

## 2018-05-26 ENCOUNTER — Encounter (INDEPENDENT_AMBULATORY_CARE_PROVIDER_SITE_OTHER): Payer: Self-pay | Admitting: Physician Assistant

## 2018-05-26 ENCOUNTER — Ambulatory Visit (INDEPENDENT_AMBULATORY_CARE_PROVIDER_SITE_OTHER): Payer: Medicare Other | Admitting: Physician Assistant

## 2018-05-26 VITALS — Ht 66.0 in | Wt 211.0 lb

## 2018-05-26 DIAGNOSIS — E440 Moderate protein-calorie malnutrition: Secondary | ICD-10-CM

## 2018-05-26 DIAGNOSIS — Z89512 Acquired absence of left leg below knee: Secondary | ICD-10-CM

## 2018-05-27 ENCOUNTER — Encounter (INDEPENDENT_AMBULATORY_CARE_PROVIDER_SITE_OTHER): Payer: Self-pay | Admitting: Physician Assistant

## 2018-05-27 NOTE — Progress Notes (Signed)
Office Visit Note   Patient: Ana Franco           Date of Birth: Dec 17, 1925           MRN: [id number] Visit Date: 05/26/2018              Requested by: Gaspar Garbe, MD [address] [place], [place] [address] PCP: Wylene Simmer Adelfa Koh, MD  Chief Complaint  Patient presents with  . Left Leg - Routine Post Op    04/10/18 left BKA       HPI: The patient is a 84 year old female who is status post a left below the knee amputation on 04/10/2018.  She has returned home with her son and daughter-in-law from the skilled nursing facility.  She has been seen [facility] the [facility] for stump shrinker stockings and is following up next week for prosthesis casting.  She is wearing a new balance stiff soled shoe with silver compression stocking on the right side with nice improvement in her edema.  Assessment & Plan: Visit Diagnoses:  1. 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 Moderate protein-calorie malnutrition (HCC)     Plan: Counseled patient to continue to wash the left transtibial amputation with soap and water and continue her stump shrinker stocking.  Follow-up with biotech next week for prostatic fitting.  Continue new balance shoe and silver compression stocking on the right.  Protein supplementation recommended to the patient and son and they are in agreement with this.  She will follow-up in 4 weeks or sooner should she have difficulties in the interim.  Follow-Up Instructions: Return in about 4 weeks (around 06/23/2018).   Ortho Exam  Patient is alert, oriented, no adenopathy, well-dressed, normal affect, normal respiratory effort. Patient presents at the wheelchair level.  She is wearing a new balance tennis shoe and compression stocking on the right.  She has 2 small dark dry eschar areas over the medial and lateral aspect of her left transtibial amputation.  No signs of cellulitis.  Markedly improved edema.  She has full knee extension and good  flexion.  Imaging: No results found. No images are attached to the encounter.  Labs: Lab Results  Component Value Date   REPTSTATUS 04/05/2018 FINAL 03/31/2018   GRAMSTAIN  03/31/2018    NO WBC SEEN RARE GRAM POSITIVE COCCI IN PAIRS Gram Stain Report Called to,Read Back By and Verified With: T.GREEN AT 1450 ON 03/31/18 BY N.THOMPSON Performed at [facility], [address], [place], [place] [address]    CULT  03/31/2018    FEW METHICILLIN RESISTANT STAPHYLOCOCCUS AUREUS NO ANAEROBES ISOLATED Performed at [facility], [address] [place], [place] [address]    LABORGA METHICILLIN RESISTANT STAPHYLOCOCCUS AUREUS 03/31/2018     Lab Results  Component Value Date   ALBUMIN 2.7 (L) 04/04/2018   ALBUMIN 3.4 (L) 06/12/2014    Body mass index is 34.06 kg/m.  Orders:  No orders of the defined types were placed in this encounter.  No orders of the defined types were placed in this encounter.    Procedures: No procedures performed  Clinical Data: No additional findings.  ROS:  All other systems negative, except as noted in the HPI. Review of Systems  Objective: Vital Signs: Ht 5\' 6"  (1.676 m)   Wt 211 lb (95.7 kg)   BMI 34.06 kg/m   Specialty Comments:  No specialty comments available.  PMFS History: Patient Active Problem List  Diagnosis Date Noted  . Paroxysmal atrial fibrillation (HCC) 05/04/2018  . Gangrene of lower extremity (HCC)   . Subacute osteomyelitis, left ankle and foot (HCC)   . S/P ORIF (open reduction internal fixation) fracture 04/03/2018  . Wound infection 04/02/2018  . MRSA (methicillin resistant staph aureus) culture positive 04/02/2018  . Acute blood loss anemia 04/02/2018  . Closed left ankle fracture, with delayed healing, subsequent encounter 03/31/2018  . Essential hypertension, benign 06/23/2014  . Dyslipidemia 06/23/2014  . GERD (gastroesophageal reflux disease) 06/23/2014  . UTI (urinary tract  infection) 06/23/2014  . Depression 06/23/2014   Past Medical History:  Diagnosis Date  . Anemia    vitamin b 12 deficiency anemia  . 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 Chronic venous insufficiency   . Depression   . Difficulty in walking   . Displaced trimalleolar fracture of left lower leg   . Essential (primary) hypertension   . GERD (gastroesophageal reflux disease)   . History of blood transfusion    02/2018   . History of falling   . Hyperlipidemia   . Hyperlipidemia   . Hypertension   . 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 Iron deficiency   . Major depressive disorder   . Muscle weakness (generalized)   . Myocardial infarction (HCC)    nonstemi mi - 02/19/2018 at [facility] after ankle fracture   . Nutritional deficiency, unspecified   . Obesity   . Other lack of coordination   . Unspecified atrial fibrillation (HCC)   . Unspecified osteoarthritis, unspecified site     History reviewed. No pertinent family history.  Past Surgical History:  Procedure Laterality Date  . AMPUTATION Left 04/10/2018   Procedure: LEFT BELOW KNEE AMPUTATION;  Surgeon: Nadara Mustard, MD;  Location: [facility] OR;  Service: Orthopedics;  Laterality: Left;  . APPENDECTOMY    . APPLICATION OF WOUND VAC Left 03/31/2018   Procedure: APPLICATION OF WOUND VAC;  Surgeon: Sheral Apley, MD;  Location: [facility] ORS;  Service: Orthopedics;  Laterality: Left;  . HARDWARE REMOVAL Left 03/31/2018   Procedure: HARDWARE REMOVAL;  Surgeon: Sheral Apley, MD;  Location: [facility];  Service: Orthopedics;  Laterality: Left;  . IRRIGATION AND DEBRIDEMENT FOOT Left 03/31/2018   Procedure: IRRIGATION AND DEBRIDEMENT OF LEFT ANKLE;  Surgeon: Sheral Apley, MD;  Location: [facility];  Service: Orthopedics;  Laterality: Left;  . ORIF ANKLE FRACTURE Right 06/13/2014   Procedure: OPEN  REDUCTION INTERNAL FIXATION (ORIF) BIMALLEOLAR ANKLE FRACTURE;  Surgeon: Loreta Ave, MD;  Location: [facility] OR;  Service: Orthopedics;  Laterality: Right;   Social History   Occupational History  . Not on file  Tobacco Use  . Smoking status: Never Smoker  . Smokeless tobacco: Never Used  Substance and Sexual Activity  . Alcohol use: No  . Drug use: No  . Sexual activity: Not Currently

## 2018-06-22 ENCOUNTER — Encounter (INDEPENDENT_AMBULATORY_CARE_PROVIDER_SITE_OTHER): Payer: Self-pay | Admitting: Orthopedic Surgery

## 2018-06-22 ENCOUNTER — Ambulatory Visit (INDEPENDENT_AMBULATORY_CARE_PROVIDER_SITE_OTHER): Payer: Medicare Other | Admitting: Orthopedic Surgery

## 2018-06-22 VITALS — Ht 66.0 in | Wt 211.0 lb

## 2018-06-22 DIAGNOSIS — Z89512 Acquired absence of left leg below knee: Secondary | ICD-10-CM

## 2018-06-24 ENCOUNTER — Encounter (INDEPENDENT_AMBULATORY_CARE_PROVIDER_SITE_OTHER): Payer: Self-pay | Admitting: Orthopedic Surgery

## 2018-06-24 NOTE — Progress Notes (Signed)
Office Visit Note   Patient: Ana SanesDorothy Swab           Date of Birth: 19-Jul-1926           MRN: [id number] Visit Date: 06/22/2018              Requested by: Gaspar Garbeisovec, Richard W, MD [address] [place], [place] [address] PCP: Wylene Simmerisovec, Adelfa Kohichard W, MD  Chief Complaint  Patient presents with  . Left Leg - Routine Post Op    04/10/18 left BKA       HPI: Patient is a 84 year old woman who presents 10 weeks status post left transtibial amputation.  Patient has been wearing her stump shrinker she states she is developed a little bit of dermatitis.  She complains of onychomycotic nails on the right foot.  Assessment & Plan: Visit Diagnoses:  1. 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  Recommended cortisone cream for the dermatitis on the left transtibial amputation follow-up with biotech for prosthetic fitting.  Family FMLA leave completed for 3 months.  Follow-Up Instructions: Return in about 4 weeks (around 07/20/2018).   Ortho Exam  Patient is alert, oriented, no adenopathy, well-dressed, normal affect, normal respiratory effort. Examination the incision is well-healed she has some mild dermatitis.  Recommended starting cortisone cream there is no redness no cellulitis no signs of infection.  Examination she has thickened discolored onychomycotic nails x5 in the right foot.  The nails were trimmed x5 without complications.  Imaging: No results found. No images are attached to the encounter.  Labs: Lab Results  Component Value Date   REPTSTATUS 04/05/2018 FINAL 03/31/2018   GRAMSTAIN  03/31/2018    NO WBC SEEN RARE GRAM POSITIVE COCCI IN PAIRS Gram Stain Report Called to,Read Back By and Verified With: T.GREEN AT 1450 ON 03/31/18 BY N.THOMPSON Performed at [facility], [address] [place], [place] [address]    CULT  03/31/2018    FEW METHICILLIN RESISTANT STAPHYLOCOCCUS AUREUS NO  ANAEROBES ISOLATED Performed at [facility], [address] [place], [place] [address]    LABORGA METHICILLIN RESISTANT STAPHYLOCOCCUS AUREUS 03/31/2018     Lab Results  Component Value Date   ALBUMIN 2.7 (L) 04/04/2018   ALBUMIN 3.4 (L) 06/12/2014    Body mass index is 34.06 kg/m.  Orders:  No orders of the defined types were placed in this encounter.  No orders of the defined types were placed in this encounter.    Procedures: No procedures performed  Clinical Data: No additional findings.  ROS:  All other systems negative, except as noted in the HPI. Review of Systems  Objective: Vital Signs: Ht 5\' 6"  (1.676 m)   Wt 211 lb (95.7 kg)   BMI 34.06 kg/m   Specialty Comments:  No specialty comments available.  PMFS History: Patient Active Problem List   Diagnosis Date Noted  . Paroxysmal atrial fibrillation (HCC) 05/04/2018  . Gangrene of lower extremity (HCC)   . Subacute osteomyelitis, left ankle and foot (HCC)   . S/P ORIF (open reduction internal fixation) fracture 04/03/2018  . Wound infection 04/02/2018  . MRSA (methicillin resistant staph aureus) culture positive 04/02/2018  . Acute blood loss anemia 04/02/2018  . Closed left ankle fracture, with delayed healing, subsequent encounter 03/31/2018  . Essential hypertension, benign 06/23/2014  . Dyslipidemia 06/23/2014  . GERD (gastroesophageal reflux disease) 06/23/2014  . UTI (urinary tract infection)  06/23/2014  . Depression 06/23/2014   Past Medical History:  Diagnosis Date  . Anemia    vitamin b 12 deficiency anemia  . 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 Chronic venous insufficiency   . Depression   . Difficulty in walking   . Displaced trimalleolar fracture of left lower leg   . Essential (primary) hypertension   . GERD (gastroesophageal reflux disease)   . History of blood transfusion    02/2018   . History of falling   . Hyperlipidemia   .  Hyperlipidemia   . Hypertension   . 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 Iron deficiency   . Major depressive disorder   . Muscle weakness (generalized)   . Myocardial infarction (HCC)    nonstemi mi - 02/19/2018 at [facility] after ankle fracture   . Nutritional deficiency, unspecified   . Obesity   . Other lack of coordination   . Unspecified atrial fibrillation (HCC)   . Unspecified osteoarthritis, unspecified site     History reviewed. No pertinent family history.  Past Surgical History:  Procedure Laterality Date  . AMPUTATION Left 04/10/2018   Procedure: LEFT BELOW KNEE AMPUTATION;  Surgeon: Nadara Mustard, MD;  Location: [facility] OR;  Service: Orthopedics;  Laterality: Left;  . APPENDECTOMY    . APPLICATION OF WOUND VAC Left 03/31/2018   Procedure: APPLICATION OF WOUND VAC;  Surgeon: Sheral Apley, MD;  Location: [facility];  Service: Orthopedics;  Laterality: Left;  . HARDWARE REMOVAL Left 03/31/2018   Procedure: HARDWARE REMOVAL;  Surgeon: Sheral Apley, MD;  Location: [facility];  Service: Orthopedics;  Laterality: Left;  . IRRIGATION AND DEBRIDEMENT FOOT Left 03/31/2018   Procedure: IRRIGATION AND DEBRIDEMENT OF LEFT ANKLE;  Surgeon: Sheral Apley, MD;  Location: [facility];  Service: Orthopedics;  Laterality: Left;  . ORIF ANKLE FRACTURE Right 06/13/2014   Procedure: OPEN REDUCTION INTERNAL FIXATION (ORIF) BIMALLEOLAR ANKLE FRACTURE;  Surgeon: Loreta Ave, MD;  Location: [facility] OR;  Service: Orthopedics;  Laterality: Right;   Social History   Occupational History  . Not on file  Tobacco Use  . Smoking status: Never Smoker  . Smokeless tobacco: Never Used  Substance and Sexual Activity  . Alcohol use: No  . Drug use: No  . Sexual activity: Not Currently

## 2018-07-27 ENCOUNTER — Encounter (INDEPENDENT_AMBULATORY_CARE_PROVIDER_SITE_OTHER): Payer: Self-pay | Admitting: Orthopedic Surgery

## 2018-07-27 ENCOUNTER — Ambulatory Visit (INDEPENDENT_AMBULATORY_CARE_PROVIDER_SITE_OTHER): Payer: Medicare Other | Admitting: Orthopedic Surgery

## 2018-07-27 VITALS — Ht 66.0 in | Wt 211.0 lb

## 2018-07-27 DIAGNOSIS — Z89512 Acquired absence of left leg below knee: Secondary | ICD-10-CM | POA: Diagnosis not present

## 2018-07-27 NOTE — Progress Notes (Signed)
Office Visit Note   Patient: Ana SanesDorothy Franco           Date of Birth: 06-07-1926           MRN: [id number] Visit Date: 07/27/2018              Requested by: Gaspar Garbeisovec, Richard W, MD [address] [place], [place] [address] PCP: Wylene Simmerisovec, Adelfa Kohichard W, MD  Chief Complaint  Patient presents with  . Left Leg - Follow-up    04/10/18 left BKA       HPI: Patient is a 84 year old woman who presents 3 months status post left transtibial amputation.  Patient states she has been fit for a prosthesis and will obtain the leg tomorrow.  Assessment & Plan: Visit Diagnoses:  1. 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  She will follow-up with [facility] for gait training.  Patient may require further assistance from family.  We will see how she does with her therapy her family may need to extend their FMLA for several months in order for the patient to be independent.  Follow-Up Instructions: Return in about 3 months (around 10/26/2018).   Ortho Exam  Patient is alert, oriented, no adenopathy, well-dressed, normal affect, normal respiratory effort. Examination the residual limb is well consolidated there is no redness no cellulitis no open wounds no drainage no signs of infection.  Imaging: No results found.   Labs: Lab Results  Component Value Date   REPTSTATUS 04/05/2018 FINAL 03/31/2018   GRAMSTAIN  03/31/2018    NO WBC SEEN RARE GRAM POSITIVE COCCI IN PAIRS Gram Stain Report Called to,Read Back By and Verified With: T.GREEN AT 1450 ON 03/31/18 BY N.THOMPSON Performed at [facility], [address] [place], [place] [address]    CULT  03/31/2018    FEW METHICILLIN RESISTANT STAPHYLOCOCCUS AUREUS NO ANAEROBES ISOLATED Performed at [facility], [address] [place], [place] [address]    LABORGA METHICILLIN RESISTANT STAPHYLOCOCCUS AUREUS 03/31/2018     Lab Results  Component Value Date   ALBUMIN 2.7 (L)  04/04/2018   ALBUMIN 3.4 (L) 06/12/2014    Body mass index is 34.06 kg/m.  Orders:  No orders of the defined types were placed in this encounter.  No orders of the defined types were placed in this encounter.    Procedures: No procedures performed  Clinical Data: No additional findings.  ROS:  All other systems negative, except as noted in the HPI. Review of Systems  Objective: Vital Signs: Ht 5\' 6"  (1.676 m)   Wt 211 lb (95.7 kg)   BMI 34.06 kg/m   Specialty Comments:  No specialty comments available.  PMFS History: Patient Active Problem List   Diagnosis Date Noted  . Paroxysmal atrial fibrillation (HCC) 05/04/2018  . Gangrene of lower extremity (HCC)   . Subacute osteomyelitis, left ankle and foot (HCC)   . S/P ORIF (open reduction internal fixation) fracture 04/03/2018  . Wound infection 04/02/2018  . MRSA (methicillin resistant staph aureus) culture positive 04/02/2018  . Acute blood loss anemia 04/02/2018  . Closed left ankle fracture, with delayed healing, subsequent encounter 03/31/2018  . Essential hypertension, benign 06/23/2014  . Dyslipidemia 06/23/2014  . GERD (gastroesophageal reflux disease) 06/23/2014  . UTI (urinary tract infection) 06/23/2014  . Depression 06/23/2014   Past Medical History:  Diagnosis Date  . Anemia    vitamin b 12 deficiency anemia  . Chronic ulcer  of left ankle with necrosis of muscle (HCC)   . Chronic venous insufficiency   . Depression   . Difficulty in walking   . Displaced trimalleolar fracture of left lower leg   . Essential (primary) hypertension   . GERD (gastroesophageal reflux disease)   . History of blood transfusion    02/2018   . History of falling   . Hyperlipidemia   . Hyperlipidemia   . Hypertension   . 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 Iron deficiency   . Major depressive disorder   . Muscle  weakness (generalized)   . Myocardial infarction (HCC)    nonstemi mi - 02/19/2018 at [facility] after ankle fracture   . Nutritional deficiency, unspecified   . Obesity   . Other lack of coordination   . Unspecified atrial fibrillation (HCC)   . Unspecified osteoarthritis, unspecified site     History reviewed. No pertinent family history.  Past Surgical History:  Procedure Laterality Date  . AMPUTATION Left 04/10/2018   Procedure: LEFT BELOW KNEE AMPUTATION;  Surgeon: Nadara Mustard, MD;  Location: [facility] OR;  Service: Orthopedics;  Laterality: Left;  . APPENDECTOMY    . APPLICATION OF WOUND VAC Left 03/31/2018   Procedure: APPLICATION OF WOUND VAC;  Surgeon: Sheral Apley, MD;  Location: [facility];  Service: Orthopedics;  Laterality: Left;  . HARDWARE REMOVAL Left 03/31/2018   Procedure: HARDWARE REMOVAL;  Surgeon: Sheral Apley, MD;  Location: [facility];  Service: Orthopedics;  Laterality: Left;  . IRRIGATION AND DEBRIDEMENT FOOT Left 03/31/2018   Procedure: IRRIGATION AND DEBRIDEMENT OF LEFT ANKLE;  Surgeon: Sheral Apley, MD;  Location: [facility];  Service: Orthopedics;  Laterality: Left;  . ORIF ANKLE FRACTURE Right 06/13/2014   Procedure: OPEN REDUCTION INTERNAL FIXATION (ORIF) BIMALLEOLAR ANKLE FRACTURE;  Surgeon: Loreta Ave, MD;  Location: [facility] OR;  Service: Orthopedics;  Laterality: Right;   Social History   Occupational History  . Not on file  Tobacco Use  . Smoking status: Never Smoker  . Smokeless tobacco: Never Used  Substance and Sexual Activity  . Alcohol use: No  . Drug use: No  . Sexual activity: Not Currently

## 2018-08-10 ENCOUNTER — Telehealth (INDEPENDENT_AMBULATORY_CARE_PROVIDER_SITE_OTHER): Payer: Self-pay | Admitting: Orthopedic Surgery

## 2018-08-10 ENCOUNTER — Other Ambulatory Visit (INDEPENDENT_AMBULATORY_CARE_PROVIDER_SITE_OTHER): Payer: Self-pay

## 2018-08-10 DIAGNOSIS — Z89512 Acquired absence of left leg below knee: Secondary | ICD-10-CM

## 2018-08-10 NOTE — Telephone Encounter (Signed)
Order for [facility] neuro rehab in chart for prosthetic gait training s/p left BKA

## 2018-08-10 NOTE — Telephone Encounter (Signed)
Ana Franco  [phone]    Spoke with Ana Contrasachel from outpatient Neuro she is currently waiting for a referral to be sent over to their office. Patient received her left below the knee prosthetic on 1217/19 In need of referral for PT.

## 2018-08-24 ENCOUNTER — Encounter: Payer: Self-pay | Admitting: Physical Therapy

## 2018-08-24 ENCOUNTER — Other Ambulatory Visit: Payer: Self-pay

## 2018-08-24 ENCOUNTER — Ambulatory Visit: Payer: Medicare Other | Attending: Orthopedic Surgery | Admitting: Physical Therapy

## 2018-08-24 DIAGNOSIS — R2689 Other abnormalities of gait and mobility: Secondary | ICD-10-CM | POA: Insufficient documentation

## 2018-08-24 DIAGNOSIS — R2681 Unsteadiness on feet: Secondary | ICD-10-CM | POA: Insufficient documentation

## 2018-08-24 DIAGNOSIS — Z9181 History of falling: Secondary | ICD-10-CM | POA: Insufficient documentation

## 2018-08-24 DIAGNOSIS — M6281 Muscle weakness (generalized): Secondary | ICD-10-CM | POA: Insufficient documentation

## 2018-08-24 DIAGNOSIS — R293 Abnormal posture: Secondary | ICD-10-CM | POA: Insufficient documentation

## 2018-08-24 NOTE — Therapy (Signed)
[facility] [address] [place], [place], [address] Phone: [phone]   Fax:  [phone]  Physical Therapy Evaluation  Patient Details  Name: Ana Franco MRN: [id number] Date of Birth: Jun 19, 1926 Referring Provider (PT): Aldean Baker, MD   Encounter Date: 08/24/2018  PT End of Session - 08/24/18 1346    Visit Number  1    Number of Visits  26    Date for PT Re-Evaluation  11/20/18    Authorization Type  [employer] Medicare.  Pt has no financial responsibility. Follow Medicare guidelines.     PT Start Time  0845    PT Stop Time  0933    PT Time Calculation (min)  48 min    Equipment Utilized During Treatment  Gait belt    Activity Tolerance  Patient tolerated treatment well;Patient limited by fatigue    Behavior During Therapy  WFL for tasks assessed/performed       Past Medical History:  Diagnosis Date  . Anemia    vitamin b 12 deficiency anemia  . 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 Chronic venous insufficiency   . Depression   . Difficulty in walking   . Displaced trimalleolar fracture of left lower leg   . Essential (primary) hypertension   . GERD (gastroesophageal reflux disease)   . History of blood transfusion    02/2018   . History of falling   . Hyperlipidemia   . Hyperlipidemia   . Hypertension   . 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 Iron deficiency   . Major depressive disorder   . Muscle weakness (generalized)   . Myocardial infarction (HCC)    nonstemi mi - 02/19/2018 at [facility] after ankle fracture   . Nutritional deficiency, unspecified   . Obesity   . Other lack of coordination   . Unspecified atrial fibrillation (HCC)   . Unspecified osteoarthritis, unspecified site     Past Surgical History:  Procedure Laterality Date  . AMPUTATION  Left 04/10/2018   Procedure: LEFT BELOW KNEE AMPUTATION;  Surgeon: Nadara Mustard, MD;  Location: [facility] OR;  Service: Orthopedics;  Laterality: Left;  . APPENDECTOMY    . APPLICATION OF WOUND VAC Left 03/31/2018   Procedure: APPLICATION OF WOUND VAC;  Surgeon: Sheral Apley, MD;  Location: [facility] ORS;  Service: Orthopedics;  Laterality: Left;  . HARDWARE REMOVAL Left 03/31/2018   Procedure: HARDWARE REMOVAL;  Surgeon: Sheral Apley, MD;  Location: [facility];  Service: Orthopedics;  Laterality: Left;  . IRRIGATION AND DEBRIDEMENT FOOT Left 03/31/2018   Procedure: IRRIGATION AND DEBRIDEMENT OF LEFT ANKLE;  Surgeon: Sheral Apley, MD;  Location: [facility];  Service: Orthopedics;  Laterality: Left;  . ORIF ANKLE FRACTURE Right 06/13/2014   Procedure: OPEN REDUCTION INTERNAL FIXATION (ORIF) BIMALLEOLAR ANKLE FRACTURE;  Surgeon: Loreta Ave, MD;  Location: [facility] OR;  Service: Orthopedics;  Laterality: Right;    There were no vitals filed for this visit.   Subjective Assessment - 08/24/18 0854    Subjective  This 85yo female was referred on 08/10/2018 to PT by Aldean Baker, MD. She underwent a left Transtibial Amputation on 04/10/2018 due to infection in ankle. She fractured right ankle in 06/2014 with ORIF. She fractured 02/09/2018 with ORIF but became infected.  She received her prosthesis 07/28/2018.    Patient is accompained by:  Family member   son, Brett Canales   Pertinent History  L TTA, depression, HTN, HLD, MI, OA, A-Fib,    Limitations  Lifting;Standing;Walking;House hold activities    Patient Stated Goals  To learn to use prosthesis and live alone again.     Currently in Pain?  No/denies         [facility] PT Assessment - 08/24/18 0845      Assessment   Medical Diagnosis  Left Transtibial Amputation    Referring Provider (PT)  Aldean Baker, MD    Onset Date/Surgical Date  07/28/18   Prosthesis delivery   Hand Dominance  Right    Prior Therapy  [facility] [facility] [facility] 2-3 months       Precautions   Precautions  Fall      Balance Screen   Has the patient fallen in the past 6 months  Yes    How many times?  1    Has the patient had a decrease in activity level because of a fear of falling?   Yes    Is the patient reluctant to leave their home because of a fear of falling?   Yes      Home Environment   Living Environment  Private residence    Living Arrangements  Alone;Children   PFS alone in her house, now at son's house & wife, 2 dogs   Type of Home  House    Home Access  Stairs to enter;Ramped entrance   Steve's ramp & hers stairs   Entrance Stairs-Number of Steps  2    Entrance Stairs-Rails  Left    Home Layout  One level   laundry is in carport so 2 steps   Home Equipment  Wheelchair - [employer] - 2 wheels;Cane - single point;Bedside commode;Tub bench;Grab bars - tub/shower   her house has new walk-in shower with grab bars     Prior Function   Level of Independence  Independent;Independent with household mobility without device;Independent with community mobility without device    Vocation  Retired    Leisure  active church & [employer], groceries, cooking      Posture/Postural Control   Posture/Postural Control  Postural limitations    Postural Limitations  Rounded Shoulders;Forward head;Flexed trunk;Weight shift right      ROM / Strength   AROM / PROM / Strength  AROM;Strength      AROM   Overall AROM   Within functional limits for tasks performed;Deficits    Overall AROM Comments  Right shoulder AROM to ~90* only      Strength   Overall Strength  Deficits    Overall Strength Comments  Right shoulder grossly 3-/5, Left shoulder grossly 4/5, bil. elbows 4/5    Strength Assessment Site  Hip;Knee;Ankle    Right Hip Flexion  4-/5    Right Hip Extension  3/5    Right Hip ABduction  3/5    Left Hip Flexion  4-/5    Left Hip Extension  3/5    Left Hip ABduction  3/5    Right/Left Knee  Right;Left    Right Knee Flexion  3/5    Right Knee Extension   3+/5    Left Knee Flexion  3/5    Left Knee Extension  3+/5    Right Ankle Dorsiflexion  4/5      Transfers   Transfers  Sit to Stand;Stand to Sit    Sit to Stand  4: Min assist;4: Min guard;With  upper extremity assist;With armrests;From chair/3-in-1;Other (comment)   requires RW to stabilize   Stand to Sit  5: Supervision;With upper extremity assist;With armrests;To chair/3-in-1;Other (comment)   requires RW for stability     Ambulation/Gait   Ambulation/Gait  Yes    Ambulation/Gait Assistance  3: Mod assist    Ambulation/Gait Assistance Details  excessive UE weight bearing on RW with partial weight on prosthesis    Ambulation Distance (Feet)  25 Feet    Assistive device  Rolling walker;Prosthesis    Gait Pattern  Step-to pattern;Decreased step length - right;Decreased stance time - left;Decreased stride length;Decreased hip/knee flexion - left;Decreased weight shift to left;Left hip hike;Left circumduction;Left foot flat;Left flexed knee in stance;Right flexed knee in stance;Antalgic;Lateral hip instability;Trunk flexed;Abducted - left    Ambulation Surface  Indoor;Level      Balance   Balance Assessed  Yes      Static Standing Balance   Static Standing - Balance Support  Bilateral upper extremity supported    Static Standing - Level of Assistance  5: Stand by assistance   min guard   Static Standing - Comment/# of Minutes  2 minutes      Dynamic Standing Balance   Dynamic Standing - Balance Support  Bilateral upper extremity supported;Left upper extremity supported   LUE support with dominant UE reach   Dynamic Standing - Level of Assistance  4: Min assist    Dynamic Standing - Balance Activities  Eyes opn;Head nods;Head turns;Reaching for objects    Dynamic Standing - Comments  scans with cervical motion only with BUE support on RW with minA and reaches 2" anteriorly & to knee level with minA. Reaches to floor with modA      Prosthetics Assessment - 08/24/18 0845       Prosthetics   Prosthetic Care Dependent with  Skin check;Residual limb care;Care of non-amputated limb;Prosthetic cleaning;Ply sock cleaning;Correct ply sock adjustment;Proper wear schedule/adjustment;Proper weight-bearing schedule/adjustment    Donning prosthesis   Min assist    Doffing prosthesis   Supervision    Current prosthetic wear tolerance (days/week)   reports daily 27 of 27 days since delivery    Current prosthetic wear tolerance (#hours/day)   10-20 minutes 1x/day    Current prosthetic weight-bearing tolerance (hours/day)   Pt tolerated standing & gait for 3 minutes without limb pain but RLE fatigued.     Edema  pitting    Residual limb condition   healing 22mm blister at distal limb, dry scaly area on lateral incision with delayed healing, no hair growth, normal color & temperature, cylinderical shape,     K code/activity level with prosthetic use   K2 limited community with fixed cadence               Objective measurements completed on examination: See above findings.      [facility] Adult PT Treatment/Exercise - 08/24/18 0845      Prosthetics   Prosthetic Care Comments   sitting with prosthesis supported on heel on ground or stool.  Initiate wear 2 hrs 2x/day and increase weekly if no issues.     Education Provided  Skin check;Residual limb care;Prosthetic cleaning;Correct ply sock adjustment;Proper Donning;Proper wear schedule/adjustment;Other (comment)   see prosthetic care comments   Person(s) Educated  Patient;Child(ren)    Education Method  Explanation;Demonstration;Tactile cues;Verbal cues    Education Method  Verbalized understanding;Verbal cues required;Needs further instruction               PT Short Term Goals -  08/24/18 1451      PT SHORT TERM GOAL #1   Title  Patient donnes prosthesis modified independent correctly & verbalizes proper cleaning. (All STGs Target Date: 09/23/2018)    Time  3    Period  Months    Status  New    Target Date   09/23/18      PT SHORT TERM GOAL #2   Title  Patient tolerates prosthesis wear >8hrs total / day without skin issues.     Time  1    Period  Months    Status  New    Target Date  09/23/18      PT SHORT TERM GOAL #3   Title  Patient performs sit to/from stand chairs with armrests to RW and static stance 2 minutes without UE support with supervision.     Time  1    Period  Months    Status  New    Target Date  09/23/18      PT SHORT TERM GOAL #4   Title  Patient ambulates 100' with RW & prosthesis with supervision.     Time  1    Period  Months    Status  New    Target Date  09/23/18      PT SHORT TERM GOAL #5   Title  Patient negotiates ramps & curbs with RW & prosthesis with modA.     Time  1    Period  Months    Status  New    Target Date  09/23/18        PT Long Term Goals - 08/24/18 1354      PT LONG TERM GOAL #1   Title  Patient verbalizes & demonstrates understanding of prosthetic care to enable safe utilization of prosthesis (All LTGs Target Date: 11/20/2018)    Time  3    Period  Months    Status  New    Target Date  11/20/18      PT LONG TERM GOAL #2   Title  Patient tolerates wear of prosthesis >90% of awake hours without skin or limb pain isses.     Time  3    Period  Months    Status  New    Target Date  11/20/18      PT LONG TERM GOAL #3   Title  Standing balance with RW support reaching 12" & picking up items from floor and static without UE support for 2 minutes safely modified indenpendent.     Time  3    Period  Months    Status  New    Target Date  11/20/18      PT LONG TERM GOAL #4   Title  Patient ambulates 300' with RW & prosthesis modified independent.     Time  3    Period  Months    Status  New    Target Date  11/20/18      PT LONG TERM GOAL #5   Title  Patient negotiates ramps & curbs with RW & prosthesis modified independent for community access.    Time  3    Period  Months    Status  New    Target Date  11/20/18      PT  LONG TERM GOAL #6   Title  Patient ambulates around furniture carrying plate safely modified independent.     Time  3    Period  Months  Status  New    Target Date  11/20/18             Plan - 08/24/18 1347    Clinical Impression Statement  This 85yo female appears to have been functioning at community level without device prior to ankle fracture and subsequent left Transtibial Amputation. She has weakness in shoulders, hips, knees & right ankle and impaired posture negatively impacting standing balance & gait. Her sit to/from stand transfers require assistance and rolling walker to stablize. Her standing balance is dependent on walker support iwth close supervision. Patient's prosthetic gait requires assistance, walker support & deviations indicating high fall risk.     History and Personal Factors relevant to plan of care:  living with her son & dtr-law but wants to return home to live alone. L TTA, depression, HTN, HLD, MI, OA, A-Fib, right ankle fx ORIF, Left ankle fx ORIF    Clinical Presentation  Stable    Clinical Decision Making  Low    Rehab Potential  Good    PT Frequency  2x / week    PT Duration  Other (comment)   13 weeks (90 days)   PT Treatment/Interventions  ADLs/Self Care Home Management;DME Instruction;Gait training;Stair training;Functional mobility training;Therapeutic activities;Therapeutic exercise;Balance training;Neuromuscular re-education;Patient/family education;Prosthetic Training;Vestibular    PT Next Visit Plan  review prosthetic care, prosthetic gait with RW, standing HEP at sink    Consulted and Agree with Plan of Care  Patient;Family member/caregiver    Family Member Consulted  son, Brett CanalesSteve       Patient will benefit from skilled therapeutic intervention in order to improve the following deficits and impairments:  Abnormal gait, Decreased activity tolerance, Decreased balance, Decreased coordination, Decreased endurance, Decreased knowledge of use of  DME, Decreased mobility, Decreased range of motion, Decreased strength, Dizziness, Postural dysfunction, Prosthetic Dependency  Visit Diagnosis: Unsteadiness on feet  Other abnormalities of gait and mobility  Muscle weakness (generalized)  Abnormal posture  History of falling     Problem List Patient Active Problem List   Diagnosis Date Noted  . Paroxysmal atrial fibrillation (HCC) 05/04/2018  . Gangrene of lower extremity (HCC)   . Subacute osteomyelitis, left ankle and foot (HCC)   . S/P ORIF (open reduction internal fixation) fracture 04/03/2018  . Wound infection 04/02/2018  . MRSA (methicillin resistant staph aureus) culture positive 04/02/2018  . Acute blood loss anemia 04/02/2018  . Closed left ankle fracture, with delayed healing, subsequent encounter 03/31/2018  . Essential hypertension, benign 06/23/2014  . Dyslipidemia 06/23/2014  . GERD (gastroesophageal reflux disease) 06/23/2014  . UTI (urinary tract infection) 06/23/2014  . Depression 06/23/2014    Vladimir FasterWALDRON,[place] Churchill PT, DPT 08/24/2018, 2:56 PM  [facility] [address] [place], [place], [address] Phone: [phone]   Fax:  [phone]  Name: Marijo SanesDorothy Prater MRN: [id number] Date of Birth: Dec 15, 1925

## 2018-08-26 ENCOUNTER — Ambulatory Visit: Payer: Medicare Other | Admitting: Physical Therapy

## 2018-08-26 ENCOUNTER — Encounter: Payer: Self-pay | Admitting: Physical Therapy

## 2018-08-26 DIAGNOSIS — R2681 Unsteadiness on feet: Secondary | ICD-10-CM | POA: Diagnosis not present

## 2018-08-26 DIAGNOSIS — R2689 Other abnormalities of gait and mobility: Secondary | ICD-10-CM

## 2018-08-26 DIAGNOSIS — R293 Abnormal posture: Secondary | ICD-10-CM

## 2018-08-26 DIAGNOSIS — M6281 Muscle weakness (generalized): Secondary | ICD-10-CM

## 2018-08-27 NOTE — Therapy (Signed)
[facility] [address] [place], [place], [address] Phone: [phone]   Fax:  [phone]  Physical Therapy Treatment  Patient Details  Name: Ana Franco MRN: [id number] Date of Birth: [birth year] Referring Provider (PT): Aldean Baker, MD   Encounter Date: 08/26/2018  PT End of Session - 08/26/18 1819    Visit Number  2    Number of Visits  26    Date for PT Re-Evaluation  11/20/18    Authorization Type  [employer] Medicare.  Pt has no financial responsibility. Follow Medicare guidelines.     PT Start Time  1445    PT Stop Time  1530    PT Time Calculation (min)  45 min    Equipment Utilized During Treatment  Gait belt    Activity Tolerance  Patient tolerated treatment well;Patient limited by fatigue    Behavior During Therapy  WFL for tasks assessed/performed       Past Medical History:  Diagnosis Date  . Anemia    vitamin b 12 deficiency anemia  . 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 Chronic venous insufficiency   . Depression   . Difficulty in walking   . Displaced trimalleolar fracture of left lower leg   . Essential (primary) hypertension   . GERD (gastroesophageal reflux disease)   . History of blood transfusion    02/2018   . History of falling   . Hyperlipidemia   . Hyperlipidemia   . Hypertension   . 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 Iron deficiency   . Major depressive disorder   . Muscle weakness (generalized)   . Myocardial infarction (HCC)    nonstemi mi - 02/19/2018 at [facility] after ankle fracture   . Nutritional deficiency, unspecified   . Obesity   . Other lack of coordination   . Unspecified atrial fibrillation (HCC)   . Unspecified osteoarthritis, unspecified site     Past Surgical History:  Procedure Laterality Date  . AMPUTATION  Left 04/10/2018   Procedure: LEFT BELOW KNEE AMPUTATION;  Surgeon: Nadara Mustard, MD;  Location: [facility] OR;  Service: Orthopedics;  Laterality: Left;  . APPENDECTOMY    . APPLICATION OF WOUND VAC Left 03/31/2018   Procedure: APPLICATION OF WOUND VAC;  Surgeon: Sheral Apley, MD;  Location: [facility] ORS;  Service: Orthopedics;  Laterality: Left;  . HARDWARE REMOVAL Left 03/31/2018   Procedure: HARDWARE REMOVAL;  Surgeon: Sheral Apley, MD;  Location: [facility] ORS;  Service: Orthopedics;  Laterality: Left;  . IRRIGATION AND DEBRIDEMENT FOOT Left 03/31/2018   Procedure: IRRIGATION AND DEBRIDEMENT OF LEFT ANKLE;  Surgeon: Sheral Apley, MD;  Location: [facility] ORS;  Service: Orthopedics;  Laterality: Left;  . ORIF ANKLE FRACTURE Right 06/13/2014   Procedure: OPEN REDUCTION INTERNAL FIXATION (ORIF) BIMALLEOLAR ANKLE FRACTURE;  Surgeon: Loreta Ave, MD;  Location: [facility] OR;  Service: Orthopedics;  Laterality: Right;    There were no vitals filed for this visit.  Subjective Assessment - 08/26/18 1448    Subjective  She has been wearing prosthesis 2 hrs 2x/day.     Patient is accompained by:  Family member    Pertinent History  L TTA, depression, HTN, HLD, MI, OA, A-Fib,    Limitations  Lifting;Standing;Walking;House hold activities    Patient Stated Goals  To learn to use prosthesis and live alone again.  Currently in Pain?  No/denies                       [facility] - 08/26/18 1445      Transfers   Transfers  Sit to Stand;Stand to Sit    Sit to Stand  3: Mod assist;4: Min assist;With upper extremity assist;With armrests;From chair/3-in-1   to RW, MinA to arise but ModA to stabilize   Sit to Stand Details  Tactile cues for weight shifting;Tactile cues for sequencing;Visual cues for safe use of DME/AE;Verbal cues for technique    Stand to Sit  4: Min assist;With upper extremity assist;With armrests;To chair/3-in-1   from RW   Stand to Sit Details (indicate cue  type and reason)  Tactile cues for sequencing;Visual cues for safe use of DME/AE;Verbal cues for technique;Verbal cues for sequencing;Verbal cues for safe use of DME/AE      Ambulation/Gait   Ambulation/Gait  Yes    Ambulation/Gait Assistance  3: Mod assist    Ambulation/Gait Assistance Details  theraband for visual cues to proper step width / not adducting, upright posture and wt shift over prosthesis    Ambulation Distance (Feet)  25 Feet   25' X 2 and 15' X 2   Assistive device  Rolling walker;Prosthesis    Ambulation Surface  Indoor;Level      Prosthetics   Prosthetic Care Comments   cues on adjusting ply socks ambulating with too few, too many & correct ply fit using feel / fit with standing & gait and guide of ease of donning, number of clicks seated and location of patella to anterior socket after limb is seated in socket from standing/gait.       Current prosthetic wear tolerance (days/week)   daily    Current prosthetic wear tolerance (#hours/day)   2 hrs 2x/day for last 2 days since evaluation   continue 2hrs 2x/day, increase 1hr weekly if no issues   Edema  pitting    Residual limb condition   healing 5mm blister at distal limb, dry scaly area on lateral incision with delayed healing, no hair growth, normal color & temperature, cylinderical shape,     Education Provided  Skin check;Residual limb care;Correct ply sock adjustment;Proper Donning;Proper wear schedule/adjustment    Person(s) Educated  Patient;Child(ren)   son & dtr-law present   Education Method  Explanation;Demonstration;Tactile cues;Verbal cues    Education Method  Verbalized understanding;Returned demonstration;Tactile cues required;Verbal cues required;Needs further instruction             PT Education - 08/26/18 1525    Education Details  assisting gait 10' only with 90* turn to sit    Person(s) Educated  Patient;Child(ren)    Methods  Explanation;Demonstration;Tactile cues;Verbal cues    Comprehension   Verbalized understanding;Returned demonstration;Verbal cues required;Tactile cues required;Need further instruction       PT Short Term Goals - 08/26/18 1819      PT SHORT TERM GOAL #1   Title  Patient donnes prosthesis modified independent correctly & verbalizes proper cleaning. (All STGs Target Date: 09/23/2018)    Time  3    Period  Months    Status  On-going    Target Date  09/23/18      PT SHORT TERM GOAL #2   Title  Patient tolerates prosthesis wear >8hrs total / day without skin issues.     Time  1    Period  Months    Status  On-going  Target Date  09/23/18      PT SHORT TERM GOAL #3   Title  Patient performs sit to/from stand chairs with armrests to RW and static stance 2 minutes without UE support with supervision.     Time  1    Period  Months    Status  On-going    Target Date  09/23/18      PT SHORT TERM GOAL #4   Title  Patient ambulates 100' with RW & prosthesis with supervision.     Time  1    Period  Months    Status  On-going    Target Date  09/23/18      PT SHORT TERM GOAL #5   Title  Patient negotiates ramps & curbs with RW & prosthesis with modA.     Time  1    Period  Months    Status  On-going    Target Date  09/23/18        PT Long Term Goals - 08/26/18 1820      PT LONG TERM GOAL #1   Title  Patient verbalizes & demonstrates understanding of prosthetic care to enable safe utilization of prosthesis (All LTGs Target Date: 11/20/2018)    Time  3    Period  Months    Status  On-going    Target Date  11/20/18      PT LONG TERM GOAL #2   Title  Patient tolerates wear of prosthesis >90% of awake hours without skin or limb pain isses.     Time  3    Period  Months    Status  On-going    Target Date  11/20/18      PT LONG TERM GOAL #3   Title  Standing balance with RW support reaching 12" & picking up items from floor and static without UE support for 2 minutes safely modified indenpendent.     Time  3    Period  Months    Status   On-going    Target Date  11/20/18      PT LONG TERM GOAL #4   Title  Patient ambulates 300' with RW & prosthesis modified independent.     Time  3    Period  Months    Status  On-going    Target Date  11/20/18      PT LONG TERM GOAL #5   Title  Patient negotiates ramps & curbs with RW & prosthesis modified independent for community access.    Time  3    Period  Months    Status  On-going    Target Date  11/20/18      PT LONG TERM GOAL #6   Title  Patient ambulates around furniture carrying plate safely modified independent.     Time  3    Period  Months    Status  On-going    Target Date  11/20/18            Plan - 08/26/18 1820    Clinical Impression Statement  Today's skilled session focused on adjusting ply socks, transferring sit to/from stand and gait up to 25' with turning 90* to sit.    Rehab Potential  Good    PT Frequency  2x / week    PT Duration  Other (comment)   13 weeks (90 days)   PT Treatment/Interventions  ADLs/Self Care Home Management;DME Instruction;Gait training;Stair training;Functional mobility training;Therapeutic activities;Therapeutic exercise;Balance training;Neuromuscular re-education;Patient/family education;Prosthetic  Training;Vestibular    PT Next Visit Plan  review prosthetic care, prosthetic gait with RW, standing HEP at sink    Consulted and Agree with Plan of Care  Patient;Family member/caregiver    Family Member Consulted  son, Brett Canales & dtr-law, Larita Fife       Patient will benefit from skilled therapeutic intervention in order to improve the following deficits and impairments:  Abnormal gait, Decreased activity tolerance, Decreased balance, Decreased coordination, Decreased endurance, Decreased knowledge of use of DME, Decreased mobility, Decreased range of motion, Decreased strength, Dizziness, Postural dysfunction, Prosthetic Dependency  Visit Diagnosis: Unsteadiness on feet  Other abnormalities of gait and mobility  Muscle weakness  (generalized)  Abnormal posture     Problem List Patient Active Problem List   Diagnosis Date Noted  . Paroxysmal atrial fibrillation (HCC) 05/04/2018  . Gangrene of lower extremity (HCC)   . Subacute osteomyelitis, left ankle and foot (HCC)   . S/P ORIF (open reduction internal fixation) fracture 04/03/2018  . Wound infection 04/02/2018  . MRSA (methicillin resistant staph aureus) culture positive 04/02/2018  . Acute blood loss anemia 04/02/2018  . Closed left ankle fracture, with delayed healing, subsequent encounter 03/31/2018  . Essential hypertension, benign 06/23/2014  . Dyslipidemia 06/23/2014  . GERD (gastroesophageal reflux disease) 06/23/2014  . UTI (urinary tract infection) 06/23/2014  . Depression 06/23/2014    Vladimir Faster PT, DPT 08/27/2018, 6:24 PM  [facility] [address] [place], [place], [address] Phone: [phone]   Fax:  [phone]  Name: Ana Franco MRN: [id number] Date of Birth: 1926/04/10

## 2018-09-02 ENCOUNTER — Ambulatory Visit: Payer: Medicare Other | Admitting: Physical Therapy

## 2018-09-02 ENCOUNTER — Encounter: Payer: Self-pay | Admitting: Physical Therapy

## 2018-09-02 DIAGNOSIS — R293 Abnormal posture: Secondary | ICD-10-CM

## 2018-09-02 DIAGNOSIS — R2681 Unsteadiness on feet: Secondary | ICD-10-CM

## 2018-09-02 DIAGNOSIS — M6281 Muscle weakness (generalized): Secondary | ICD-10-CM

## 2018-09-02 DIAGNOSIS — R2689 Other abnormalities of gait and mobility: Secondary | ICD-10-CM

## 2018-09-03 NOTE — Therapy (Signed)
[facility] [address] [place], [place], [address] Phone: [phone]   Fax:  [phone]  Physical Therapy Treatment  Patient Details  Name: Ana Franco MRN: [id number] Date of Birth: 08-10-1926 Referring Provider (PT): Aldean Baker, MD   Encounter Date: 09/02/2018  PT End of Session - 09/02/18 1539    Visit Number  3    Number of Visits  26    Date for PT Re-Evaluation  11/20/18    Authorization Type  [employer] Medicare.  Pt has no financial responsibility. Follow Medicare guidelines.     PT Start Time  1445    PT Stop Time  1530    PT Time Calculation (min)  45 min    Equipment Utilized During Treatment  Gait belt    Activity Tolerance  Patient tolerated treatment well;Patient limited by fatigue    Behavior During Therapy  WFL for tasks assessed/performed       Past Medical History:  Diagnosis Date  . Anemia    vitamin b 12 deficiency anemia  . 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 Chronic venous insufficiency   . Depression   . Difficulty in walking   . Displaced trimalleolar fracture of left lower leg   . Essential (primary) hypertension   . GERD (gastroesophageal reflux disease)   . History of blood transfusion    02/2018   . History of falling   . Hyperlipidemia   . Hyperlipidemia   . Hypertension   . 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 Iron deficiency   . Major depressive disorder   . Muscle weakness (generalized)   . Myocardial infarction (HCC)    nonstemi mi - 02/19/2018 at [facility] after ankle fracture   . Nutritional deficiency, unspecified   . Obesity   . Other lack of coordination   . Unspecified atrial fibrillation (HCC)   . Unspecified osteoarthritis, unspecified site     Past Surgical History:  Procedure Laterality Date  . AMPUTATION  Left 04/10/2018   Procedure: LEFT BELOW KNEE AMPUTATION;  Surgeon: Nadara Mustard, MD;  Location: [facility] OR;  Service: Orthopedics;  Laterality: Left;  . APPENDECTOMY    . APPLICATION OF WOUND VAC Left 03/31/2018   Procedure: APPLICATION OF WOUND VAC;  Surgeon: Sheral Apley, MD;  Location: [facility] ORS;  Service: Orthopedics;  Laterality: Left;  . HARDWARE REMOVAL Left 03/31/2018   Procedure: HARDWARE REMOVAL;  Surgeon: Sheral Apley, MD;  Location: [facility] OR[facility];  Service: Orthopedics;  Laterality: Left;  . IRRIGATION AND DEBRIDEMENT FOOT Left 03/31/2018   Procedure: IRRIGATION AND DEBRIDEMENT OF LEFT ANKLE;  Surgeon: Sheral Apley, MD;  Location: [facility] OR[facility];  Service: Orthopedics;  Laterality: Left;  . ORIF ANKLE FRACTURE Right 06/13/2014   Procedure: OPEN REDUCTION INTERNAL FIXATION (ORIF) BIMALLEOLAR ANKLE FRACTURE;  Surgeon: Loreta Ave, MD;  Location: [facility] OR;  Service: Orthopedics;  Laterality: Right;    There were no vitals filed for this visit.  Subjective Assessment - 09/02/18 1450    Subjective  She has been wearing the prosthesis 2-4hrs 2x/day. She has been walking with walker with dtr-law without issues.     Patient is accompained by:  Family member    Pertinent History  L TTA, depression, HTN, HLD, MI, OA, A-Fib,    Limitations  Lifting;Standing;Walking;House hold activities    Patient Stated Goals  To learn to use prosthesis and live alone again.     Currently in Pain?  No/denies                       [facility] - 09/02/18 1445      Transfers   Transfers  Sit to Stand;Stand to Sit    Sit to Stand  4: Min guard;4: Min assist;With upper extremity assist;With armrests;From chair/3-in-1   to RW, Min guard from chairs w/ armrests & MinA chairs no    Sit to Stand Details  Tactile cues for weight shifting;Tactile cues for sequencing;Visual cues for safe use of DME/AE;Verbal cues for technique    Sit to Stand Details (indicate cue type and reason)   demo & verbal cues on technique from chairs without armrests pushing with UEs    Stand to Sit  5: Supervision;4: Min guard;With upper extremity assist;With armrests;To chair/3-in-1   from RW, SBA chairs armrests & Min guard no armrests   Stand to Sit Details (indicate cue type and reason)  Tactile cues for sequencing;Visual cues for safe use of DME/AE;Verbal cues for technique;Verbal cues for sequencing;Verbal cues for safe use of DME/AE    Stand to Sit Details  demo & verbal cues on technique from chairs without armrests pushing with UEs      Ambulation/Gait   Ambulation/Gait  Yes    Ambulation/Gait Assistance  4: Min assist;4: Min guard    Ambulation/Gait Assistance Details  visual, verbal & tactile cues on step width, upright posture and wt shift over stance limb    Ambulation Distance (Feet)  25 Feet   25' X 3 including 180* turn to position to sit down   Assistive device  Rolling walker;Prosthesis    Ambulation Surface  Indoor;Level    Stairs  Yes    Stairs Assistance  3: Mod assist    Stairs Assistance Details (indicate cue type and reason)  demo, verbal & manual cues on technique with TTA prosthesis    Stair Management Technique  Two rails;Step to pattern;Forwards    Number of Stairs  4      Self-Care   Self-Care  ADL's    ADL's  verbal cues on using prosthesis to ambulate into bathroom, sit on tub bench, remove prosthesis to bathe, donne prosthesis to transfer to toilet for dry area to dress and then ambulate with RW out of bathroom. All with family supervision / assistance.       Prosthetics   Prosthetic Care Comments   increase wear to 4hrs 2x/day.  Signs of sweating & need to dry limb/liner.     Current prosthetic wear tolerance (days/week)   daily    Current prosthetic wear tolerance (#hours/day)   2-4 hrs 2x/day    Edema  pitting    Residual limb condition   healing 5mm blister at distal limb, dry scaly area on lateral incision with delayed healing, no hair growth, normal  color & temperature, cylinderical shape,     Education Provided  Skin check;Residual limb care;Correct ply sock adjustment;Proper Donning;Proper wear schedule/adjustment    Person(s) Educated  Patient;Child(ren)    Education Method  Explanation;Verbal cues    Education Method  Verbalized understanding;Verbal cues required;Needs further instruction    Donning Prosthesis  Minimal assist               PT Short Term Goals - 08/26/18 1819      PT SHORT TERM GOAL #1  Title  Patient donnes prosthesis modified independent correctly & verbalizes proper cleaning. (All STGs Target Date: 09/23/2018)    Time  3    Period  Months    Status  On-going    Target Date  09/23/18      PT SHORT TERM GOAL #2   Title  Patient tolerates prosthesis wear >8hrs total / day without skin issues.     Time  1    Period  Months    Status  On-going    Target Date  09/23/18      PT SHORT TERM GOAL #3   Title  Patient performs sit to/from stand chairs with armrests to RW and static stance 2 minutes without UE support with supervision.     Time  1    Period  Months    Status  On-going    Target Date  09/23/18      PT SHORT TERM GOAL #4   Title  Patient ambulates 100' with RW & prosthesis with supervision.     Time  1    Period  Months    Status  On-going    Target Date  09/23/18      PT SHORT TERM GOAL #5   Title  Patient negotiates ramps & curbs with RW & prosthesis with modA.     Time  1    Period  Months    Status  On-going    Target Date  09/23/18        PT Long Term Goals - 08/26/18 1820      PT LONG TERM GOAL #1   Title  Patient verbalizes & demonstrates understanding of prosthetic care to enable safe utilization of prosthesis (All LTGs Target Date: 11/20/2018)    Time  3    Period  Months    Status  On-going    Target Date  11/20/18      PT LONG TERM GOAL #2   Title  Patient tolerates wear of prosthesis >90% of awake hours without skin or limb pain isses.     Time  3    Period   Months    Status  On-going    Target Date  11/20/18      PT LONG TERM GOAL #3   Title  Standing balance with RW support reaching 12" & picking up items from floor and static without UE support for 2 minutes safely modified indenpendent.     Time  3    Period  Months    Status  On-going    Target Date  11/20/18      PT LONG TERM GOAL #4   Title  Patient ambulates 300' with RW & prosthesis modified independent.     Time  3    Period  Months    Status  On-going    Target Date  11/20/18      PT LONG TERM GOAL #5   Title  Patient negotiates ramps & curbs with RW & prosthesis modified independent for community access.    Time  3    Period  Months    Status  On-going    Target Date  11/20/18      PT LONG TERM GOAL #6   Title  Patient ambulates around furniture carrying plate safely modified independent.     Time  3    Period  Months    Status  On-going    Target Date  11/20/18  Plan - 09/02/18 1610    Clinical Impression Statement  Patient improved sit to/from stand & stand-pivot transfers with RW and gait with RW. PT progressed to standing from chairs without armrests. Also PT instructed in negotiating stairs for first time with prosthesis with second person for safety but not needed.     Rehab Potential  Good    PT Frequency  2x / week    PT Duration  Other (comment)   13 weeks (90 days)   PT Treatment/Interventions  ADLs/Self Care Home Management;DME Instruction;Gait training;Stair training;Functional mobility training;Therapeutic activities;Therapeutic exercise;Balance training;Neuromuscular re-education;Patient/family education;Prosthetic Training;Vestibular    PT Next Visit Plan  review prosthetic care, prosthetic gait with RW, review stairs and instruct in ramps & curbs, standing HEP at sink    Consulted and Agree with Plan of Care  Patient;Family member/caregiver    Family Member Consulted  son, Brett CanalesSteve       Patient will benefit from skilled therapeutic  intervention in order to improve the following deficits and impairments:  Abnormal gait, Decreased activity tolerance, Decreased balance, Decreased coordination, Decreased endurance, Decreased knowledge of use of DME, Decreased mobility, Decreased range of motion, Decreased strength, Dizziness, Postural dysfunction, Prosthetic Dependency  Visit Diagnosis: Unsteadiness on feet  Other abnormalities of gait and mobility  Muscle weakness (generalized)  Abnormal posture     Problem List Patient Active Problem List   Diagnosis Date Noted  . Paroxysmal atrial fibrillation (HCC) 05/04/2018  . Gangrene of lower extremity (HCC)   . Subacute osteomyelitis, left ankle and foot (HCC)   . S/P ORIF (open reduction internal fixation) fracture 04/03/2018  . Wound infection 04/02/2018  . MRSA (methicillin resistant staph aureus) culture positive 04/02/2018  . Acute blood loss anemia 04/02/2018  . Closed left ankle fracture, with delayed healing, subsequent encounter 03/31/2018  . Essential hypertension, benign 06/23/2014  . Dyslipidemia 06/23/2014  . GERD (gastroesophageal reflux disease) 06/23/2014  . UTI (urinary tract infection) 06/23/2014  . Depression 06/23/2014    Vladimir FasterWALDRON,Dillion Stowers PT, DPT 09/03/2018, 4:13 PM  [facility] [address] [place], [place], [address] Phone: [phone]   Fax:  [phone]  Name: [name] MRN: [id number] Date of Birth: Mar 21, 1926

## 2018-09-04 ENCOUNTER — Encounter: Payer: Self-pay | Admitting: Physical Therapy

## 2018-09-04 ENCOUNTER — Ambulatory Visit: Payer: Medicare Other | Admitting: Physical Therapy

## 2018-09-09 ENCOUNTER — Encounter: Payer: Self-pay | Admitting: Physical Therapy

## 2018-09-14 ENCOUNTER — Ambulatory Visit: Payer: Medicare Other | Attending: Orthopedic Surgery | Admitting: Physical Therapy

## 2018-09-14 ENCOUNTER — Encounter: Payer: Self-pay | Admitting: Physical Therapy

## 2018-09-14 DIAGNOSIS — R2681 Unsteadiness on feet: Secondary | ICD-10-CM | POA: Diagnosis not present

## 2018-09-14 DIAGNOSIS — M6281 Muscle weakness (generalized): Secondary | ICD-10-CM | POA: Insufficient documentation

## 2018-09-14 DIAGNOSIS — R2689 Other abnormalities of gait and mobility: Secondary | ICD-10-CM | POA: Diagnosis present

## 2018-09-14 DIAGNOSIS — R293 Abnormal posture: Secondary | ICD-10-CM | POA: Insufficient documentation

## 2018-09-14 DIAGNOSIS — Z9181 History of falling: Secondary | ICD-10-CM | POA: Insufficient documentation

## 2018-09-15 NOTE — Therapy (Signed)
[facility] [address] [place], [place], [address] Phone: [phone]   Fax:  [phone]  Physical Therapy Treatment  Patient Details  Name: Ana Franco MRN: [id number] Date of Birth: [birth year] Referring Provider (PT): Aldean Baker, MD   Encounter Date: 09/14/2018  PT End of Session - 09/14/18 1924    Visit Number  4    Number of Visits  26    Date for PT Re-Evaluation  11/20/18    Authorization Type  [employer]HC Medicare.  Pt has no financial responsibility. Follow Medicare guidelines.     PT Start Time  1315    PT Stop Time  1400    PT Time Calculation (min)  45 min    Equipment Utilized During Treatment  Gait belt    Activity Tolerance  Patient tolerated treatment well;Patient limited by fatigue    Behavior During Therapy  WFL for tasks assessed/performed       Past Medical History:  Diagnosis Date  . Anemia    vitamin b 12 deficiency anemia  . 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 Chronic venous insufficiency   . Depression   . Difficulty in walking   . Displaced trimalleolar fracture of left lower leg   . Essential (primary) hypertension   . GERD (gastroesophageal reflux disease)   . History of blood transfusion    02/2018   . History of falling   . Hyperlipidemia   . Hyperlipidemia   . Hypertension   . 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 Iron deficiency   . Major depressive disorder   . Muscle weakness (generalized)   . Myocardial infarction (HCC)    nonstemi mi - 02/19/2018 at [facility] after ankle fracture   . Nutritional deficiency, unspecified   . Obesity   . Other lack of coordination   . Unspecified atrial fibrillation (HCC)   . Unspecified osteoarthritis, unspecified site     Past Surgical History:  Procedure Laterality Date  . AMPUTATION Left  04/10/2018   Procedure: LEFT BELOW KNEE AMPUTATION;  Surgeon: Nadara Mustard, MD;  Location: [facility] OR;  Service: Orthopedics;  Laterality: Left;  . APPENDECTOMY    . APPLICATION OF WOUND VAC Left 03/31/2018   Procedure: APPLICATION OF WOUND VAC;  Surgeon: Sheral Apley, MD;  Location: [facility] ORS;  Service: Orthopedics;  Laterality: Left;  . HARDWARE REMOVAL Left 03/31/2018   Procedure: HARDWARE REMOVAL;  Surgeon: Sheral Apley, MD;  Location: [facility] ORS;  Service: Orthopedics;  Laterality: Left;  . IRRIGATION AND DEBRIDEMENT FOOT Left 03/31/2018   Procedure: IRRIGATION AND DEBRIDEMENT OF LEFT ANKLE;  Surgeon: Sheral Apley, MD;  Location: [facility] ORS;  Service: Orthopedics;  Laterality: Left;  . ORIF ANKLE FRACTURE Right 06/13/2014   Procedure: OPEN REDUCTION INTERNAL FIXATION (ORIF) BIMALLEOLAR ANKLE FRACTURE;  Surgeon: Loreta Ave, MD;  Location: [facility] OR;  Service: Orthopedics;  Laterality: Right;    There were no vitals filed for this visit.  Subjective Assessment - 09/14/18 1315    Subjective  Reports wearing 4-5 hrs 2x/day and drying half way. No issues. No falls. Walking with dtr-law with RW in home.     Patient is accompained by:  Family member    Pertinent History  L TTA, depression, HTN, HLD, MI, OA, A-Fib,    Limitations  Lifting;Standing;Walking;House hold activities    Patient  Stated Goals  To learn to use prosthesis and live alone again.     Currently in Pain?  No/denies                       [facility] Adult PT Treatment/Exercise - 09/14/18 1315      Transfers   Transfers  Sit to Stand;Stand to Sit    Sit to Stand  4: Min guard;With upper extremity assist;With armrests;From chair/3-in-1   to RW, unable first attempt with desk arms backwards   Sit to Stand Details  Tactile cues for weight shifting;Tactile cues for sequencing;Visual cues for safe use of DME/AE;Verbal cues for technique    Stand to Sit  5: Supervision;With upper extremity assist;With armrests;To  chair/3-in-1   from RW   Stand to Sit Details (indicate cue type and reason)  Tactile cues for sequencing;Visual cues for safe use of DME/AE;Verbal cues for technique;Verbal cues for sequencing;Verbal cues for safe use of DME/AE      Ambulation/Gait   Ambulation/Gait  Yes    Ambulation/Gait Assistance  4: Min guard;5: Supervision    Ambulation/Gait Assistance Details  demo, verbal & tactile cues on upright posture with glancing at floor, not staring.     Ambulation Distance (Feet)  100 Feet    Assistive device  Rolling walker;Prosthesis    Ambulation Surface  Indoor;Level    Stairs  --    Stairs Assistance  --    Stair Management Technique  --    Number of Stairs  --    Ramp  4: Min assist   RW & prosthesis   Ramp Details (indicate cue type and reason)  demo, tactile & verbal cues on technique with TTA prosthesis    Curb  4: Min assist   RW & prosthesis   Curb Details (indicate cue type and reason)  demo, tactile & verbal cues on technique with TTA prosthesis      Self-Care   Self-Care  --    ADL's  --      Prosthetics   Prosthetic Care Comments   use of antiperspirant on residual limb after bathing.  Dry, wipe limb with wet or dry cloth with doffing to prevent scratching    Current prosthetic wear tolerance (days/week)   daily    Current prosthetic wear tolerance (#hours/day)   4-5 hrs 2x/day drying half way   continue same time with skin color changes   Edema  pitting    Residual limb condition   bruising at tibial plateau with no skin breakdown,     Education Provided  Residual limb care;Correct ply sock adjustment;Proper Donning;Proper wear schedule/adjustment;Skin check;Other (comment)   see prosthetic care comments   Person(s) Educated  Patient;Child(ren)    Education Method  Explanation;Demonstration;Verbal cues    Education Method  Verbalized understanding;Tactile cues required;Verbal cues required;Needs further instruction    Donning Prosthesis  Supervision              PT Education - 09/14/18 1330    Education Details  Walking to maximal tolerable distance outside & how to follow with w/c being pulled backward so family can supervise / minimal guard her    Person(s) Educated  Patient;Child(ren)    Methods  Explanation;Verbal cues    Comprehension  Verbalized understanding       PT Short Term Goals - 08/26/18 1819      PT SHORT TERM GOAL #1   Title  Patient donnes prosthesis modified independent correctly &  verbalizes proper cleaning. (All STGs Target Date: 09/23/2018)    Time  3    Period  Months    Status  On-going    Target Date  09/23/18      PT SHORT TERM GOAL #2   Title  Patient tolerates prosthesis wear >8hrs total / day without skin issues.     Time  1    Period  Months    Status  On-going    Target Date  09/23/18      PT SHORT TERM GOAL #3   Title  Patient performs sit to/from stand chairs with armrests to RW and static stance 2 minutes without UE support with supervision.     Time  1    Period  Months    Status  On-going    Target Date  09/23/18      PT SHORT TERM GOAL #4   Title  Patient ambulates 100' with RW & prosthesis with supervision.     Time  1    Period  Months    Status  On-going    Target Date  09/23/18      PT SHORT TERM GOAL #5   Title  Patient negotiates ramps & curbs with RW & prosthesis with modA.     Time  1    Period  Months    Status  On-going    Target Date  09/23/18        PT Long Term Goals - 08/26/18 1820      PT LONG TERM GOAL #1   Title  Patient verbalizes & demonstrates understanding of prosthetic care to enable safe utilization of prosthesis (All LTGs Target Date: 11/20/2018)    Time  3    Period  Months    Status  On-going    Target Date  11/20/18      PT LONG TERM GOAL #2   Title  Patient tolerates wear of prosthesis >90% of awake hours without skin or limb pain isses.     Time  3    Period  Months    Status  On-going    Target Date  11/20/18      PT LONG TERM  GOAL #3   Title  Standing balance with RW support reaching 12" & picking up items from floor and static without UE support for 2 minutes safely modified indenpendent.     Time  3    Period  Months    Status  On-going    Target Date  11/20/18      PT LONG TERM GOAL #4   Title  Patient ambulates 300' with RW & prosthesis modified independent.     Time  3    Period  Months    Status  On-going    Target Date  11/20/18      PT LONG TERM GOAL #5   Title  Patient negotiates ramps & curbs with RW & prosthesis modified independent for community access.    Time  3    Period  Months    Status  On-going    Target Date  11/20/18      PT LONG TERM GOAL #6   Title  Patient ambulates around furniture carrying plate safely modified independent.     Time  3    Period  Months    Status  On-going    Target Date  11/20/18            Plan -  09/14/18 1924    Clinical Impression Statement  PT instructed in negotiating ramps & curbs with RW & TTA prosthesis. PT also instructed in how to work on increasing gait distance & pt/son appear to understand.     Rehab Potential  Good    PT Frequency  2x / week    PT Duration  Other (comment)   13 weeks (90 days)   PT Treatment/Interventions  ADLs/Self Care Home Management;DME Instruction;Gait training;Stair training;Functional mobility training;Therapeutic activities;Therapeutic exercise;Balance training;Neuromuscular re-education;Patient/family education;Prosthetic Training;Vestibular    PT Next Visit Plan  review prosthetic care, prosthetic gait with RW, review stairs, ramps & curbs, standing HEP at sink    Consulted and Agree with Plan of Care  Patient;Family member/caregiver    Family Member Consulted  son, Brett Canales       Patient will benefit from skilled therapeutic intervention in order to improve the following deficits and impairments:  Abnormal gait, Decreased activity tolerance, Decreased balance, Decreased coordination, Decreased endurance,  Decreased knowledge of use of DME, Decreased mobility, Decreased range of motion, Decreased strength, Dizziness, Postural dysfunction, Prosthetic Dependency  Visit Diagnosis: Unsteadiness on feet  Other abnormalities of gait and mobility  Muscle weakness (generalized)  Abnormal posture  History of falling     Problem List Patient Active Problem List   Diagnosis Date Noted  . Paroxysmal atrial fibrillation (HCC) 05/04/2018  . Gangrene of lower extremity (HCC)   . Subacute osteomyelitis, left ankle and foot (HCC)   . S/P ORIF (open reduction internal fixation) fracture 04/03/2018  . Wound infection 04/02/2018  . MRSA (methicillin resistant staph aureus) culture positive 04/02/2018  . Acute blood loss anemia 04/02/2018  . Closed left ankle fracture, with delayed healing, subsequent encounter 03/31/2018  . Essential hypertension, benign 06/23/2014  . Dyslipidemia 06/23/2014  . GERD (gastroesophageal reflux disease) 06/23/2014  . UTI (urinary tract infection) 06/23/2014  . Depression 06/23/2014    Vladimir Faster PT, DPT 09/15/2018, 9:26 AM  [facility] [address] [place], [place], [address] Phone: [phone]   Fax:  [phone]  Name: Ana Franco MRN: [id number] Date of Birth: 03-Mar-1926

## 2018-09-17 ENCOUNTER — Encounter: Payer: Self-pay | Admitting: Physical Therapy

## 2018-09-21 ENCOUNTER — Ambulatory Visit: Payer: Medicare Other | Admitting: Physical Therapy

## 2018-09-21 DIAGNOSIS — R2681 Unsteadiness on feet: Secondary | ICD-10-CM | POA: Diagnosis not present

## 2018-09-21 DIAGNOSIS — M6281 Muscle weakness (generalized): Secondary | ICD-10-CM

## 2018-09-21 DIAGNOSIS — R2689 Other abnormalities of gait and mobility: Secondary | ICD-10-CM

## 2018-09-21 DIAGNOSIS — R293 Abnormal posture: Secondary | ICD-10-CM

## 2018-09-22 ENCOUNTER — Encounter: Payer: Self-pay | Admitting: Physical Therapy

## 2018-09-22 NOTE — Therapy (Signed)
[place] [address] [place], [place], [address] Phone: [phone]   Fax:  [phone]  Physical Therapy Treatment  Patient Details  Name: Ana Franco MRN: [id number] Date of Birth: [birth year] Referring Provider (PT): Meridee Score, MD   Encounter Date: 09/21/2018  PT End of Session - 09/21/18 1800    Visit Number  5    Number of Visits  26    Date for PT Re-Evaluation  11/20/18    Authorization Type  [employer]HC Medicare.  Pt has no financial responsibility. Follow Medicare guidelines.     PT Start Time  1102    PT Stop Time  1147    PT Time Calculation (min)  45 min    Equipment Utilized During Treatment  Gait belt    Activity Tolerance  Patient tolerated treatment well;Patient limited by fatigue    Behavior During Therapy  WFL for tasks assessed/performed       Past Medical History:  Diagnosis Date  . Anemia    vitamin b 12 deficiency anemia  . 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 Chronic venous insufficiency   . Depression   . Difficulty in walking   . Displaced trimalleolar fracture of left lower leg   . Essential (primary) hypertension   . GERD (gastroesophageal reflux disease)   . History of blood transfusion    02/2018   . History of falling   . Hyperlipidemia   . Hyperlipidemia   . Hypertension   . 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 Iron deficiency   . Major depressive disorder   . Muscle weakness (generalized)   . Myocardial infarction ([place])    nonstemi mi - 02/19/2018 at [facility] after ankle fracture   . Nutritional deficiency, unspecified   . Obesity   . Other lack of coordination   . Unspecified atrial fibrillation ([place])   . Unspecified osteoarthritis, unspecified site     Past Surgical History:  Procedure Laterality Date  . AMPUTATION  Left 04/10/2018   Procedure: LEFT BELOW KNEE AMPUTATION;  Surgeon: Newt Minion, MD;  Location: [place];  Service: Orthopedics;  Laterality: Left;  . APPENDECTOMY    . APPLICATION OF WOUND VAC Left 03/31/2018   Procedure: APPLICATION OF WOUND VAC;  Surgeon: Renette Butters, MD;  Location: [facility];  Service: Orthopedics;  Laterality: Left;  . HARDWARE REMOVAL Left 03/31/2018   Procedure: HARDWARE REMOVAL;  Surgeon: Renette Butters, MD;  Location: [facility];  Service: Orthopedics;  Laterality: Left;  . IRRIGATION AND DEBRIDEMENT FOOT Left 03/31/2018   Procedure: IRRIGATION AND DEBRIDEMENT OF LEFT ANKLE;  Surgeon: Renette Butters, MD;  Location: [facility];  Service: Orthopedics;  Laterality: Left;  . ORIF ANKLE FRACTURE Right 06/13/2014   Procedure: OPEN REDUCTION INTERNAL FIXATION (ORIF) BIMALLEOLAR ANKLE FRACTURE;  Surgeon: Ninetta Lights, MD;  Location: [place];  Service: Orthopedics;  Laterality: Right;    There were no vitals filed for this visit.  Subjective Assessment - 09/21/18 1105    Subjective  She has been wearing prosthesis most of awake hours. She removes 1-2 hrs mid-day.  Family helps to donne prosthesis initially in morning due to amount of time that it takes donne prosthesis and need to toilet.     Patient is accompained by:  Family member    Pertinent History  L TTA, depression,  HTN, HLD, MI, OA, A-Fib,    Limitations  Lifting;Standing;Walking;House hold activities    Patient Stated Goals  To learn to use prosthesis and live alone again.     Currently in Pain?  No/denies                       [facility] Adult PT Treatment/Exercise - 09/21/18 1102      Transfers   Transfers  Sit to Stand;Stand to Sit    Sit to Stand  5: Supervision;With upper extremity assist;With armrests;From chair/3-in-1    Sit to Stand Details  Tactile cues for weight shifting;Tactile cues for sequencing;Visual cues for safe use of DME/AE;Verbal cues for technique    Stand to Sit  5:  Supervision;With upper extremity assist;With armrests;To chair/3-in-1   from RW   Stand to Sit Details (indicate cue type and reason)  Tactile cues for sequencing;Visual cues for safe use of DME/AE;Verbal cues for technique;Verbal cues for sequencing;Verbal cues for safe use of DME/AE      Ambulation/Gait   Ambulation/Gait  Yes    Ambulation/Gait Assistance  5: Supervision    Ambulation/Gait Assistance Details  visual, tactile & verbal cues on upright posture, step width & length    Ambulation Distance (Feet)  200 Feet    Assistive device  Rolling walker;Prosthesis    Ambulation Surface  Indoor;Level      Self-Care   ADL's  sitting on 24" stool with feet supported: pt able to sit without UE or back support for 5 minutes and simulated ADLs like doing her hair.   PT demo & instructed in reaching in upper & lower cabinets and walking / sidestepping with BUE support on counter. Pt return demo understanding with supervision for reaching and minA for gait with LOB.       Prosthetics   Prosthetic Care Comments   prosthetic socket rotates with gait. Pt to set up appointment with prosthetist for pads to be added to socket.  PT demo, instructed in placing socks & liner over cylinder then place residual limb against liner and rolling onto residual limb. Pt return demo understanding with greater ease & less time to donne. Pt & son given cylinders to attempt at home.     Current prosthetic wear tolerance (days/week)   daily    Current prosthetic wear tolerance (#hours/day)   most of awake hours except 1-2 hours midday.  Family assist donning upon arising and pt removes at bed time.     Edema  pitting    Residual limb condition   no skin changes. Color / bruising has subsided. One red area over medial tibial plateau from socket rotation.     Education Provided  Residual limb care;Correct ply sock adjustment;Proper Donning;Proper wear schedule/adjustment;Skin check;Other (comment)   see prosthetic care  comments   Person(s) Educated  Patient;Child(ren)    Education Method  Explanation;Demonstration;Tactile cues;Verbal cues    Education Method  Verbalized understanding;Returned demonstration;Tactile cues required;Verbal cues required;Needs further instruction    Donning Prosthesis  Supervision;Modified independent (device/increased time)               PT Short Term Goals - 09/21/18 1800      PT SHORT TERM GOAL #1   Title  Patient donnes prosthesis modified independent correctly & verbalizes proper cleaning. (All STGs Target Date: 09/23/2018)    Baseline  MET 09/21/2018    Time  3    Period  Months    Status  Achieved  Target Date  09/23/18      PT SHORT TERM GOAL #2   Title  Patient tolerates prosthesis wear >8hrs total / day without skin issues.     Baseline  MET 09/21/2018    Time  1    Period  Months    Status  Achieved    Target Date  09/23/18      PT SHORT TERM GOAL #3   Title  Patient performs sit to/from stand chairs with armrests to RW and static stance 2 minutes without UE support with supervision.     Baseline  MET 09/21/2018    Time  1    Period  Months    Status  Achieved    Target Date  09/23/18      PT SHORT TERM GOAL #4   Title  Patient ambulates 100' with RW & prosthesis with supervision.     Baseline  MET 09/21/2018    Time  1    Period  Months    Status  Achieved    Target Date  09/23/18      PT SHORT TERM GOAL #5   Title  Patient negotiates ramps & curbs with RW & prosthesis with modA.     Time  1    Period  Months    Status  On-going    Target Date  09/23/18        PT Long Term Goals - 08/26/18 1820      PT LONG TERM GOAL #1   Title  Patient verbalizes & demonstrates understanding of prosthetic care to enable safe utilization of prosthesis (All LTGs Target Date: 11/20/2018)    Time  3    Period  Months    Status  On-going    Target Date  11/20/18      PT LONG TERM GOAL #2   Title  Patient tolerates wear of prosthesis >90% of awake  hours without skin or limb pain isses.     Time  3    Period  Months    Status  On-going    Target Date  11/20/18      PT LONG TERM GOAL #3   Title  Standing balance with RW support reaching 12" & picking up items from floor and static without UE support for 2 minutes safely modified indenpendent.     Time  3    Period  Months    Status  On-going    Target Date  11/20/18      PT LONG TERM GOAL #4   Title  Patient ambulates 300' with RW & prosthesis modified independent.     Time  3    Period  Months    Status  On-going    Target Date  11/20/18      PT LONG TERM GOAL #5   Title  Patient negotiates ramps & curbs with RW & prosthesis modified independent for community access.    Time  3    Period  Months    Status  On-going    Target Date  11/20/18      PT LONG TERM GOAL #6   Title  Patient ambulates around furniture carrying plate safely modified independent.     Time  3    Period  Months    Status  On-going    Target Date  11/20/18            Plan - 09/21/18 1638    Clinical Impression Statement  Patient met 4 STGs checked today. She was able to improve time with donning with use of cylinder for liner & socks. Pt also instructed in use of 24" stool for modified standing for ADLs in bathroom at sink & kitchen. She also improved standing & limited gait with counter support with minimal assist for balance loss and understands not to perform without family assistance.     Rehab Potential  Good    PT Frequency  2x / week    PT Duration  Other (comment)   13 weeks (90 days)   PT Treatment/Interventions  ADLs/Self Care Home Management;DME Instruction;Gait training;Stair training;Functional mobility training;Therapeutic activities;Therapeutic exercise;Balance training;Neuromuscular re-education;Patient/family education;Prosthetic Training;Vestibular    PT Next Visit Plan  check remaining STG and set new STGs, work on standing balance     Consulted and Agree with Plan of Care   Patient;Family member/caregiver    Family Member Consulted  son, Richardson Landry       Patient will benefit from skilled therapeutic intervention in order to improve the following deficits and impairments:  Abnormal gait, Decreased activity tolerance, Decreased balance, Decreased coordination, Decreased endurance, Decreased knowledge of use of DME, Decreased mobility, Decreased range of motion, Decreased strength, Dizziness, Postural dysfunction, Prosthetic Dependency  Visit Diagnosis: Unsteadiness on feet  Other abnormalities of gait and mobility  Muscle weakness (generalized)  Abnormal posture     Problem List Patient Active Problem List   Diagnosis Date Noted  . Paroxysmal atrial fibrillation ([place]) 05/04/2018  . Gangrene of lower extremity ([place])   . Subacute osteomyelitis, left ankle and foot ([place])   . S/P ORIF (open reduction internal fixation) fracture 04/03/2018  . Wound infection 04/02/2018  . MRSA (methicillin resistant staph aureus) culture positive 04/02/2018  . Acute blood loss anemia 04/02/2018  . Closed left ankle fracture, with delayed healing, subsequent encounter 03/31/2018  . Essential hypertension, benign 06/23/2014  . Dyslipidemia 06/23/2014  . GERD (gastroesophageal reflux disease) 06/23/2014  . UTI (urinary tract infection) 06/23/2014  . Depression 06/23/2014    Jamey Reas PT, DPT 09/22/2018, 6:58 AM  [place] [address] [place], [place], [address] Phone: [phone]   Fax:  [phone]  Name: Ana Franco MRN: [id number] Date of Birth: 01-31-1926

## 2018-09-28 ENCOUNTER — Encounter: Payer: Self-pay | Admitting: Physical Therapy

## 2018-09-28 ENCOUNTER — Ambulatory Visit: Payer: Medicare Other | Admitting: Physical Therapy

## 2018-09-28 ENCOUNTER — Telehealth (INDEPENDENT_AMBULATORY_CARE_PROVIDER_SITE_OTHER): Payer: Self-pay | Admitting: Orthopedic Surgery

## 2018-09-28 DIAGNOSIS — Z9181 History of falling: Secondary | ICD-10-CM

## 2018-09-28 DIAGNOSIS — R293 Abnormal posture: Secondary | ICD-10-CM

## 2018-09-28 DIAGNOSIS — R2681 Unsteadiness on feet: Secondary | ICD-10-CM

## 2018-09-28 DIAGNOSIS — R2689 Other abnormalities of gait and mobility: Secondary | ICD-10-CM

## 2018-09-28 DIAGNOSIS — M6281 Muscle weakness (generalized): Secondary | ICD-10-CM

## 2018-09-28 NOTE — Therapy (Signed)
[facility] [address] [place], [place], [address] Phone: [phone]   Fax:  [phone]  Physical Therapy Treatment  Patient Details  Name: Ana Franco MRN: [id number] Date of Birth: [birth year] Referring Provider (PT): Meridee Score, MD   Encounter Date: 09/28/2018  PT End of Session - 09/28/18 1452    Visit Number  6    Number of Visits  26    Date for PT Re-Evaluation  11/20/18    Authorization Type  [employer] Medicare.  Pt has no financial responsibility. Follow Medicare guidelines.     PT Start Time  1315    PT Stop Time  1400    PT Time Calculation (min)  45 min    Equipment Utilized During Treatment  Gait belt    Activity Tolerance  Patient tolerated treatment well;Patient limited by fatigue    Behavior During Therapy  WFL for tasks assessed/performed       Past Medical History:  Diagnosis Date  . Anemia    vitamin b 12 deficiency anemia  . 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 Chronic venous insufficiency   . Depression   . Difficulty in walking   . Displaced trimalleolar fracture of left lower leg   . Essential (primary) hypertension   . GERD (gastroesophageal reflux disease)   . History of blood transfusion    02/2018   . History of falling   . Hyperlipidemia   . Hyperlipidemia   . Hypertension   . 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 Iron deficiency   . Major depressive disorder   . Muscle weakness (generalized)   . Myocardial infarction ([place])    nonstemi mi - 02/19/2018 at [facility] after ankle fracture   . Nutritional deficiency, unspecified   . Obesity   . Other lack of coordination   . Unspecified atrial fibrillation ([place])   . Unspecified osteoarthritis, unspecified site     Past Surgical History:  Procedure Laterality Date  . AMPUTATION  Left 04/10/2018   Procedure: LEFT BELOW KNEE AMPUTATION;  Surgeon: Newt Minion, MD;  Location: [facility];  Service: Orthopedics;  Laterality: Left;  . APPENDECTOMY    . APPLICATION OF WOUND VAC Left 03/31/2018   Procedure: APPLICATION OF WOUND VAC;  Surgeon: Renette Butters, MD;  Location: [facility];  Service: Orthopedics;  Laterality: Left;  . HARDWARE REMOVAL Left 03/31/2018   Procedure: HARDWARE REMOVAL;  Surgeon: Renette Butters, MD;  Location: [facility];  Service: Orthopedics;  Laterality: Left;  . IRRIGATION AND DEBRIDEMENT FOOT Left 03/31/2018   Procedure: IRRIGATION AND DEBRIDEMENT OF LEFT ANKLE;  Surgeon: Renette Butters, MD;  Location: [facility];  Service: Orthopedics;  Laterality: Left;  . ORIF ANKLE FRACTURE Right 06/13/2014   Procedure: OPEN REDUCTION INTERNAL FIXATION (ORIF) BIMALLEOLAR ANKLE FRACTURE;  Surgeon: Ninetta Lights, MD;  Location: [place];  Service: Orthopedics;  Laterality: Right;    There were no vitals filed for this visit.  Subjective Assessment - 09/28/18 1314    Subjective  She has still been walking with family with RW in home & limited community. She is wearing prosthesis most of awake hours.     Patient is accompained by:  Family member    Pertinent History  L TTA, depression, HTN, HLD, MI, OA, A-Fib,    Limitations  Lifting;Standing;Walking;House hold activities  Patient Stated Goals  To learn to use prosthesis and live alone again.     Currently in Pain?  No/denies                       [facility] Adult PT Treatment/Exercise - 09/28/18 1315      Transfers   Transfers  Sit to Stand;Stand to Sit    Sit to Stand  5: Supervision;With upper extremity assist;From chair/3-in-1   chair without armrests   Sit to Stand Details  Tactile cues for weight shifting;Tactile cues for sequencing;Visual cues for safe use of DME/AE;Verbal cues for technique    Stand to Sit  5: Supervision;With upper extremity assist;To chair/3-in-1   from RW to chair without  armrests   Stand to Sit Details (indicate cue type and reason)  Tactile cues for sequencing;Visual cues for safe use of DME/AE;Verbal cues for technique;Verbal cues for sequencing;Verbal cues for safe use of DME/AE      Ambulation/Gait   Ambulation/Gait  Yes    Ambulation/Gait Assistance  5: Supervision    Ambulation/Gait Assistance Details  tactile & verbal cues on weight shift on prosthesis in stance, upright posture & step width.    Ambulation Distance (Feet)  200 Feet   200' X 2   Assistive device  Rolling walker;Prosthesis    Ambulation Surface  Indoor;Level    Stairs  Yes    Stairs Assistance  4: Min assist    Stairs Assistance Details (indicate cue type and reason)  PT demo technique with left rail 2 hands sideways    Stair Management Technique  Two rails;Forwards;One rail Left;Sideways;Step to pattern   2 hands on left rail sidestepping   Number of Stairs  4   2 reps   Ramp  4: Min assist   RW & prosthesis   Ramp Details (indicate cue type and reason)  verbal & tactile cues on technique, wt shift over prosthesis & upright posture    Curb  4: Min assist   RW & prosthesis   Curb Details (indicate cue type and reason)  verbal & tactile cues on technique, wt shift over prosthesis & upright posture      Self-Care   ADL's  --      Neuro Re-ed    Neuro Re-ed Details   standing at counter with posterior pelvis to counter, RW anteriorly & BUEs on counter edge pushing upward "[place]" 10-15seconds 5 reps.       Prosthetics   Prosthetic Care Comments   fall risk when prosthesis off so PT recommended not removing for naps unless getting in her bed. Scheduled to see prosthetist in 3 days for pads, alignment check.      Current prosthetic wear tolerance (days/week)   daily    Current prosthetic wear tolerance (#hours/day)   most of awake hours except 1-2 hours midday.  Family assist donning upon arising and pt removes at bed time.     Edema  pitting    Residual limb condition    redness on medial tibial plateau, no open areas    Education Provided  Residual limb care;Correct ply sock adjustment;Proper Donning;Proper wear schedule/adjustment;Skin check;Other (comment)   see prosthetic care comments   Person(s) Educated  Patient;Child(ren)    Education Method  Explanation;Verbal cues    Education Method  Verbalized understanding;Verbal cues required               PT Short Term Goals - 09/28/18 2305  PT SHORT TERM GOAL #1   Title  Patient donnes prosthesis modified independent correctly & verbalizes proper cleaning. (All STGs Target Date: 09/23/2018)    Baseline  MET 09/21/2018    Time  3    Period  Months    Status  Achieved    Target Date  09/23/18      PT SHORT TERM GOAL #2   Title  Patient tolerates prosthesis wear >8hrs total / day without skin issues.     Baseline  MET 09/21/2018    Time  1    Period  Months    Status  Achieved    Target Date  09/23/18      PT SHORT TERM GOAL #3   Title  Patient performs sit to/from stand chairs with armrests to RW and static stance 2 minutes without UE support with supervision.     Baseline  MET 09/21/2018    Time  1    Period  Months    Status  Achieved    Target Date  09/23/18      PT SHORT TERM GOAL #4   Title  Patient ambulates 100' with RW & prosthesis with supervision.     Baseline  MET 09/21/2018    Time  1    Period  Months    Status  Achieved    Target Date  09/23/18      PT SHORT TERM GOAL #5   Title  Patient negotiates ramps & curbs with RW & prosthesis with modA.     Baseline  MET 09/28/2018    Time  1    Period  Months    Status  Achieved    Target Date  09/23/18        PT Short Term Goals - 09/28/18 2306      PT SHORT TERM GOAL #1   Title  Patient verbalizes proper adjusting ply socks for volume changes. (All updated STGs Target Date: 10/23/2018)    Time  1    Period  Months    Status  New    Target Date  10/23/18      PT SHORT TERM GOAL #2   Title  Patient tolerates  prosthesis wear >90% of awake hours / day without skin issues.     Time  1    Period  Months    Status  New    Target Date  10/23/18      PT SHORT TERM GOAL #3   Title  Patient able to pick up items from floor with RW support with supervision.     Time  1    Period  Months    Status  New    Target Date  10/23/18      PT SHORT TERM GOAL #4   Title  Patient ambulates 250' with RW & prosthesis with supervision.     Time  1    Period  Months    Status  Revised    Target Date  10/23/18      PT SHORT TERM GOAL #5   Title  Patient negotiates ramps & curbs with RW & prosthesis with minA.     Time  1    Period  Months    Status  Revised    Target Date  10/23/18         PT Long Term Goals - 08/26/18 1820      PT LONG TERM GOAL #1   Title  Patient  verbalizes & demonstrates understanding of prosthetic care to enable safe utilization of prosthesis (All LTGs Target Date: 11/20/2018)    Time  3    Period  Months    Status  On-going    Target Date  11/20/18      PT LONG TERM GOAL #2   Title  Patient tolerates wear of prosthesis >90% of awake hours without skin or limb pain isses.     Time  3    Period  Months    Status  On-going    Target Date  11/20/18      PT LONG TERM GOAL #3   Title  Standing balance with RW support reaching 12" & picking up items from floor and static without UE support for 2 minutes safely modified indenpendent.     Time  3    Period  Months    Status  On-going    Target Date  11/20/18      PT LONG TERM GOAL #4   Title  Patient ambulates 300' with RW & prosthesis modified independent.     Time  3    Period  Months    Status  On-going    Target Date  11/20/18      PT LONG TERM GOAL #5   Title  Patient negotiates ramps & curbs with RW & prosthesis modified independent for community access.    Time  3    Period  Months    Status  On-going    Target Date  11/20/18      PT LONG TERM GOAL #6   Title  Patient ambulates around furniture carrying  plate safely modified independent.     Time  3    Period  Months    Status  On-going    Target Date  11/20/18            Plan - 09/28/18 2301    Clinical Impression Statement  Patient has better understanding of sit/stand & upright posture. She improved gait with RW with tactile cues for posture. Pt appears would able to negotiate steps in garage if left rail is added which will prevent her from going out in bad weather for ramp access.     Rehab Potential  Good    PT Frequency  2x / week    PT Duration  Other (comment)   13 weeks (90 days)   PT Treatment/Interventions  ADLs/Self Care Home Management;DME Instruction;Gait training;Stair training;Functional mobility training;Therapeutic activities;Therapeutic exercise;Balance training;Neuromuscular re-education;Patient/family education;Prosthetic Training;Vestibular    PT Next Visit Plan  work on standing balance, work towards updated STGs    Consulted and Agree with Plan of Care  Patient;Family member/caregiver    Family Member Consulted  son, Richardson Landry       Patient will benefit from skilled therapeutic intervention in order to improve the following deficits and impairments:  Abnormal gait, Decreased activity tolerance, Decreased balance, Decreased coordination, Decreased endurance, Decreased knowledge of use of DME, Decreased mobility, Decreased range of motion, Decreased strength, Dizziness, Postural dysfunction, Prosthetic Dependency  Visit Diagnosis: Unsteadiness on feet  Other abnormalities of gait and mobility  Muscle weakness (generalized)  Abnormal posture  History of falling     Problem List Patient Active Problem List   Diagnosis Date Noted  . Paroxysmal atrial fibrillation ([place]) 05/04/2018  . Gangrene of lower extremity ([place])   . Subacute osteomyelitis, left ankle and foot ([place])   . S/P ORIF (open reduction internal fixation) fracture 04/03/2018  .  Wound infection 04/02/2018  . MRSA (methicillin resistant  staph aureus) culture positive 04/02/2018  . Acute blood loss anemia 04/02/2018  . Closed left ankle fracture, with delayed healing, subsequent encounter 03/31/2018  . Essential hypertension, benign 06/23/2014  . Dyslipidemia 06/23/2014  . GERD (gastroesophageal reflux disease) 06/23/2014  . UTI (urinary tract infection) 06/23/2014  . Depression 06/23/2014    Jamey Reas PT, DPT 09/28/2018, 11:06 PM  [facility] [address] [place], [place], [address] Phone: [phone]   Fax:  [phone]  Name: Driana Dazey MRN: [id number] Date of Birth: 12/28/1925

## 2018-09-28 NOTE — Telephone Encounter (Signed)
Patient daughter in law called will need an extended work  note due to her Mother in law current state. She feels that she will be unable to return to work by the 2nd of March. If the note could be extended out until next year.

## 2018-09-29 ENCOUNTER — Telehealth (INDEPENDENT_AMBULATORY_CARE_PROVIDER_SITE_OTHER): Payer: Self-pay | Admitting: Orthopedic Surgery

## 2018-09-29 NOTE — Telephone Encounter (Signed)
This has been done duplicate message.  °

## 2018-09-29 NOTE — Telephone Encounter (Signed)
Patient's son called and would like for you to return his call.  CB#[phone].  Thank you.

## 2018-09-29 NOTE — Telephone Encounter (Signed)
I called and sw Christian to advise that we can not write a letter for her to be out until next year to care for the pt. She states that she did not ask for a year she wants a few more months out of work to care for pt. I advised that we can fill out FMLA paper work or write a note when she is her for her appt and provides the transportation. She then stated " what am I supposed to do put her in a nursing home" I advised that its a personal decision that she will have to make but that we can only fill out FMLA paperwork for her employer but could not write a note for her to remain out fo work for several months. Pt voiced understanding and will call with any other questions.

## 2018-09-29 NOTE — Telephone Encounter (Signed)
Please advise 

## 2018-09-29 NOTE — Telephone Encounter (Signed)
Patient's daughter in law Ephriam Knuckles) called to leave phone number to contact her concerning previous message. The number is [phone]

## 2018-09-29 NOTE — Telephone Encounter (Signed)
This has been done.

## 2018-09-29 NOTE — Telephone Encounter (Signed)
Patient's son Brett Canales called wanted to speak to Autumn in regards to previous conversation earlier today.  [phone]

## 2018-09-29 NOTE — Telephone Encounter (Signed)
I called and husband advised that they do have FMLA he forgot that this has been completed at [facility] and that he can fax over a copy of the FMLA for [place] to make an addendum and then we can fax the information back. Advised that this will be fine and I will make whatever adjustments are needed to the paperwork. Will fax to my attention. wil hold message pending forms.

## 2018-10-01 NOTE — Telephone Encounter (Signed)
This has been done and faxed today.

## 2018-10-05 ENCOUNTER — Encounter: Payer: Self-pay | Admitting: Physical Therapy

## 2018-10-05 ENCOUNTER — Ambulatory Visit: Payer: Medicare Other | Admitting: Physical Therapy

## 2018-10-05 DIAGNOSIS — R2689 Other abnormalities of gait and mobility: Secondary | ICD-10-CM

## 2018-10-05 DIAGNOSIS — R293 Abnormal posture: Secondary | ICD-10-CM

## 2018-10-05 DIAGNOSIS — M6281 Muscle weakness (generalized): Secondary | ICD-10-CM

## 2018-10-05 DIAGNOSIS — R2681 Unsteadiness on feet: Secondary | ICD-10-CM | POA: Diagnosis not present

## 2018-10-06 NOTE — Therapy (Signed)
[facility] [address] [place], [place], [address] Phone: [phone]   Fax:  [phone]  Physical Therapy Treatment  Patient Details  Name: Ana Franco MRN: [id number] Date of Birth: 1926/02/22 Referring Provider (PT): Aldean Baker, MD   Encounter Date: 10/05/2018  PT End of Session - 10/05/18 1619    Visit Number  7    Number of Visits  26    Date for PT Re-Evaluation  11/20/18    Authorization Type  [employer] Medicare.  Pt has no financial responsibility. Follow Medicare guidelines.     PT Start Time  1448    PT Stop Time  1530    PT Time Calculation (min)  42 min    Equipment Utilized During Treatment  Gait belt    Activity Tolerance  Patient tolerated treatment well;Patient limited by fatigue    Behavior During Therapy  WFL for tasks assessed/performed       Past Medical History:  Diagnosis Date  . Anemia    vitamin b 12 deficiency anemia  . 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 Chronic venous insufficiency   . Depression   . Difficulty in walking   . Displaced trimalleolar fracture of left lower leg   . Essential (primary) hypertension   . GERD (gastroesophageal reflux disease)   . History of blood transfusion    02/2018   . History of falling   . Hyperlipidemia   . Hyperlipidemia   . Hypertension   . 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 Iron deficiency   . Major depressive disorder   . Muscle weakness (generalized)   . Myocardial infarction (HCC)    nonstemi mi - 02/19/2018 at [facility] after ankle fracture   . Nutritional deficiency, unspecified   . Obesity   . Other lack of coordination   . Unspecified atrial fibrillation (HCC)   . Unspecified osteoarthritis, unspecified site     Past Surgical History:  Procedure Laterality Date  . AMPUTATION  Left 04/10/2018   Procedure: LEFT BELOW KNEE AMPUTATION;  Surgeon: Nadara Mustard, MD;  Location: [facility] OR;  Service: Orthopedics;  Laterality: Left;  . APPENDECTOMY    . APPLICATION OF WOUND VAC Left 03/31/2018   Procedure: APPLICATION OF WOUND VAC;  Surgeon: Sheral Apley, MD;  Location: [facility] ORS;  Service: Orthopedics;  Laterality: Left;  . HARDWARE REMOVAL Left 03/31/2018   Procedure: HARDWARE REMOVAL;  Surgeon: Sheral Apley, MD;  Location: [facility] ORS;  Service: Orthopedics;  Laterality: Left;  . IRRIGATION AND DEBRIDEMENT FOOT Left 03/31/2018   Procedure: IRRIGATION AND DEBRIDEMENT OF LEFT ANKLE;  Surgeon: Sheral Apley, MD;  Location: [facility] ORS;  Service: Orthopedics;  Laterality: Left;  . ORIF ANKLE FRACTURE Right 06/13/2014   Procedure: OPEN REDUCTION INTERNAL FIXATION (ORIF) BIMALLEOLAR ANKLE FRACTURE;  Surgeon: Loreta Ave, MD;  Location: [facility] OR;  Service: Orthopedics;  Laterality: Right;    There were no vitals filed for this visit.  Subjective Assessment - 10/05/18 1448    Subjective  She has been wearing prosthesis most of awake hours.     Patient is accompained by:  Family member    Pertinent History  L TTA, depression, HTN, HLD, MI, OA, A-Fib,    Limitations  Lifting;Standing;Walking;House hold activities    Patient Stated Goals  To learn to use prosthesis and live alone  again.     Currently in Pain?  No/denies                       [facility] Adult PT Treatment/Exercise - 10/05/18 1445      Transfers   Transfers  Sit to Stand;Stand to Sit    Sit to Stand  5: Supervision;With upper extremity assist;From chair/3-in-1;With armrests   to RW   Sit to Stand Details  --    Stand to Sit  5: Supervision;With upper extremity assist;To chair/3-in-1;With armrests   from RW   Stand to Sit Details (indicate cue type and reason)  --      Ambulation/Gait   Ambulation/Gait  Yes    Ambulation/Gait Assistance  5: Supervision    Ambulation/Gait Assistance Details  tactile  & verbal cues on upright posture & step width    Ambulation Distance (Feet)  200 Feet   200' X 2   Assistive device  Rolling walker;Prosthesis    Ambulation Surface  Indoor;Level    Stairs  Yes    Stairs Assistance  4: Min assist    Stair Management Technique  Two rails;Forwards;One rail Left;Sideways;Step to pattern   2 hands on left rail sidestepping   Number of Stairs  4   2 reps   Ramp  4: Min assist   RW & prosthesis   Curb  4: Min assist   RW & prosthesis     Neuro Re-ed    Neuro Re-ed Details   --      Prosthetics   Prosthetic Care Comments   reviewed adjusting ply socks with addition of pads in socket.     Current prosthetic wear tolerance (days/week)   daily    Current prosthetic wear tolerance (#hours/day)   most of awake hours    Edema  pitting    Residual limb condition   bruise on medial tibial plateau & distal lateral on incision.  Medial femoral condyle superficial skin abrasion.     Education Provided  Residual limb care;Correct ply sock adjustment;Proper Donning;Proper wear schedule/adjustment;Skin check;Other (comment)   see prosthetic care comments   Person(s) Educated  Patient;Child(ren)    Education Method  Explanation;Demonstration;Tactile cues;Verbal cues    Education Method  Verbalized understanding;Returned demonstration;Tactile cues required;Verbal cues required;Needs further instruction          Balance Exercises - 10/05/18 1445      Balance Exercises: Standing   Standing Eyes Opened  Wide (BOA);Solid surface;Head turns;5 reps   intermittent UE support   Other Standing Exercises  reaching to knees to upright posture          PT Short Term Goals - 09/28/18 2306      PT SHORT TERM GOAL #1   Title  Patient verbalizes proper adjusting ply socks for volume changes. (All updated STGs Target Date: 10/23/2018)    Time  1    Period  Months    Status  New    Target Date  10/23/18      PT SHORT TERM GOAL #2   Title  Patient tolerates prosthesis  wear >90% of awake hours / day without skin issues.     Time  1    Period  Months    Status  New    Target Date  10/23/18      PT SHORT TERM GOAL #3   Title  Patient able to pick up items from floor with RW support with supervision.  Time  1    Period  Months    Status  New    Target Date  10/23/18      PT SHORT TERM GOAL #4   Title  Patient ambulates 250' with RW & prosthesis with supervision.     Time  1    Period  Months    Status  Revised    Target Date  10/23/18      PT SHORT TERM GOAL #5   Title  Patient negotiates ramps & curbs with RW & prosthesis with minA.     Time  1    Period  Months    Status  Revised    Target Date  10/23/18        PT Long Term Goals - 08/26/18 1820      PT LONG TERM GOAL #1   Title  Patient verbalizes & demonstrates understanding of prosthetic care to enable safe utilization of prosthesis (All LTGs Target Date: 11/20/2018)    Time  3    Period  Months    Status  On-going    Target Date  11/20/18      PT LONG TERM GOAL #2   Title  Patient tolerates wear of prosthesis >90% of awake hours without skin or limb pain isses.     Time  3    Period  Months    Status  On-going    Target Date  11/20/18      PT LONG TERM GOAL #3   Title  Standing balance with RW support reaching 12" & picking up items from floor and static without UE support for 2 minutes safely modified indenpendent.     Time  3    Period  Months    Status  On-going    Target Date  11/20/18      PT LONG TERM GOAL #4   Title  Patient ambulates 300' with RW & prosthesis modified independent.     Time  3    Period  Months    Status  On-going    Target Date  11/20/18      PT LONG TERM GOAL #5   Title  Patient negotiates ramps & curbs with RW & prosthesis modified independent for community access.    Time  3    Period  Months    Status  On-going    Target Date  11/20/18      PT LONG TERM GOAL #6   Title  Patient ambulates around furniture carrying plate safely  modified independent.     Time  3    Period  Months    Status  On-going    Target Date  11/20/18            Plan - 10/05/18 1707    Clinical Impression Statement  Patient has bruise on residual limb that appears to be from new pads with too few socks which put pressure on tibial plateau from pads in improper location.  Today's skilled session focused on prosthetic gait with RW & standing balance activities.     Rehab Potential  Good    PT Frequency  2x / week    PT Duration  Other (comment)   13 weeks (90 days)   PT Treatment/Interventions  ADLs/Self Care Home Management;DME Instruction;Gait training;Stair training;Functional mobility training;Therapeutic activities;Therapeutic exercise;Balance training;Neuromuscular re-education;Patient/family education;Prosthetic Training;Vestibular    PT Next Visit Plan  work on standing balance, work towards updated STGs    Consulted  and Agree with Plan of Care  Patient;Family member/caregiver    Family Member Consulted  son, Brett Canales       Patient will benefit from skilled therapeutic intervention in order to improve the following deficits and impairments:  Abnormal gait, Decreased activity tolerance, Decreased balance, Decreased coordination, Decreased endurance, Decreased knowledge of use of DME, Decreased mobility, Decreased range of motion, Decreased strength, Dizziness, Postural dysfunction, Prosthetic Dependency  Visit Diagnosis: Unsteadiness on feet  Other abnormalities of gait and mobility  Muscle weakness (generalized)  Abnormal posture     Problem List Patient Active Problem List   Diagnosis Date Noted  . Paroxysmal atrial fibrillation (HCC) 05/04/2018  . Gangrene of lower extremity (HCC)   . Subacute osteomyelitis, left ankle and foot (HCC)   . S/P ORIF (open reduction internal fixation) fracture 04/03/2018  . Wound infection 04/02/2018  . MRSA (methicillin resistant staph aureus) culture positive 04/02/2018  . Acute  blood loss anemia 04/02/2018  . Closed left ankle fracture, with delayed healing, subsequent encounter 03/31/2018  . Essential hypertension, benign 06/23/2014  . Dyslipidemia 06/23/2014  . GERD (gastroesophageal reflux disease) 06/23/2014  . UTI (urinary tract infection) 06/23/2014  . Depression 06/23/2014    Vladimir Faster PT, DPT 10/06/2018, 7:09 AM  [facility] [address] [place], [place], [address] Phone: [phone]   Fax:  [phone]  Name: Ana Franco MRN: [id number] Date of Birth: 12/26/1925

## 2018-10-12 ENCOUNTER — Ambulatory Visit: Payer: Medicare Other | Attending: Orthopedic Surgery | Admitting: Physical Therapy

## 2018-10-12 ENCOUNTER — Encounter: Payer: Self-pay | Admitting: Physical Therapy

## 2018-10-12 DIAGNOSIS — R293 Abnormal posture: Secondary | ICD-10-CM | POA: Insufficient documentation

## 2018-10-12 DIAGNOSIS — Z9181 History of falling: Secondary | ICD-10-CM | POA: Insufficient documentation

## 2018-10-12 DIAGNOSIS — R2689 Other abnormalities of gait and mobility: Secondary | ICD-10-CM | POA: Insufficient documentation

## 2018-10-12 DIAGNOSIS — M6281 Muscle weakness (generalized): Secondary | ICD-10-CM | POA: Diagnosis present

## 2018-10-12 DIAGNOSIS — R2681 Unsteadiness on feet: Secondary | ICD-10-CM | POA: Diagnosis not present

## 2018-10-12 NOTE — Therapy (Signed)
[facility] [address] [place], [place], [address] Phone: [phone]   Fax:  [phone]  Physical Therapy Treatment  Patient Details  Name: Ana Franco MRN: [id number] Date of Birth: 07/31/1926 Referring Provider (PT): Aldean Baker, MD   Encounter Date: 10/12/2018  PT End of Session - 10/12/18 2145    Visit Number  8    Number of Visits  26    Date for PT Re-Evaluation  11/20/18    Authorization Type  [employer]HC Medicare.  Pt has no financial responsibility. Follow Medicare guidelines.     PT Start Time  1445    PT Stop Time  1530    PT Time Calculation (min)  45 min    Equipment Utilized During Treatment  Gait belt    Activity Tolerance  Patient tolerated treatment well;Patient limited by fatigue    Behavior During Therapy  WFL for tasks assessed/performed       Past Medical History:  Diagnosis Date  . Anemia    vitamin b 12 deficiency anemia  . 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 Chronic venous insufficiency   . Depression   . Difficulty in walking   . Displaced trimalleolar fracture of left lower leg   . Essential (primary) hypertension   . GERD (gastroesophageal reflux disease)   . History of blood transfusion    02/2018   . History of falling   . Hyperlipidemia   . Hyperlipidemia   . Hypertension   . 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 Iron deficiency   . Major depressive disorder   . Muscle weakness (generalized)   . Myocardial infarction (HCC)    nonstemi mi - 02/19/2018 at [facility] after ankle fracture   . Nutritional deficiency, unspecified   . Obesity   . Other lack of coordination   . Unspecified atrial fibrillation (HCC)   . Unspecified osteoarthritis, unspecified site     Past Surgical History:  Procedure Laterality Date  . AMPUTATION Left  04/10/2018   Procedure: LEFT BELOW KNEE AMPUTATION;  Surgeon: Nadara Mustard, MD;  Location: [facility] OR;  Service: Orthopedics;  Laterality: Left;  . APPENDECTOMY    . APPLICATION OF WOUND VAC Left 03/31/2018   Procedure: APPLICATION OF WOUND VAC;  Surgeon: Sheral Apley, MD;  Location: [facility] OR[facility];  Service: Orthopedics;  Laterality: Left;  . HARDWARE REMOVAL Left 03/31/2018   Procedure: HARDWARE REMOVAL;  Surgeon: Sheral Apley, MD;  Location: [facility] OR[facility];  Service: Orthopedics;  Laterality: Left;  . IRRIGATION AND DEBRIDEMENT FOOT Left 03/31/2018   Procedure: IRRIGATION AND DEBRIDEMENT OF LEFT ANKLE;  Surgeon: Sheral Apley, MD;  Location: [facility];  Service: Orthopedics;  Laterality: Left;  . ORIF ANKLE FRACTURE Right 06/13/2014   Procedure: OPEN REDUCTION INTERNAL FIXATION (ORIF) BIMALLEOLAR ANKLE FRACTURE;  Surgeon: Loreta Ave, MD;  Location: [facility] OR;  Service: Orthopedics;  Laterality: Right;    There were no vitals filed for this visit.  Subjective Assessment - 10/12/18 1445    Subjective  No falls. The prosthesis continues to rotate.     Patient is accompained by:  Family member    Pertinent History  L TTA, depression, HTN, HLD, MI, OA, A-Fib,    Limitations  Lifting;Standing;Walking;House hold activities    Patient Stated Goals  To learn to use prosthesis and live alone again.  Currently in Pain?  No/denies                       [facility] Adult PT Treatment/Exercise - 10/12/18 1445      Transfers   Transfers  Sit to Stand;Stand to Sit    Sit to Stand  5: Supervision;With upper extremity assist;From chair/3-in-1;With armrests   to RW   Stand to Sit  5: Supervision;With upper extremity assist;To chair/3-in-1;With armrests   from RW     Ambulation/Gait   Ambulation/Gait  Yes    Ambulation/Gait Assistance  5: Supervision    Ambulation/Gait Assistance Details  demo & verbal cues on pelvic orientation, RW position, posture & sequencing     Ambulation Distance (Feet)   200 Feet   200' X 2   Assistive device  Rolling walker;Prosthesis    Gait Pattern  Step-through pattern;Decreased stance time - left;Left hip hike;Antalgic;Lateral hip instability;Trunk flexed;Abducted - left    Ambulation Surface  Indoor;Level    Stairs  --    Stairs Assistance  --    Stair Management Technique  --    Number of Stairs  --    Ramp  4: Min assist   RW & prosthesis   Ramp Details (indicate cue type and reason)  demo & verbal cues on sequence, wt shift over prosthesis and RW position    Curb  4: Min assist   RW & prosthesis   Curb Details (indicate cue type and reason)  verbal cues on technique      Prosthetics   Prosthetic Care Comments   signs of sweating & need to dry limb/liner, also redonning with liner slippage, sitting without adducting prosthesis / heel on lateral side only,     Current prosthetic wear tolerance (days/week)   daily    Current prosthetic wear tolerance (#hours/day)   most of awake hours    Edema  pitting    Residual limb condition   bruise on medial tibial plateau & distal lateral on incision.  Medial femoral condyle superficial skin abrasion.     Education Provided  Residual limb care;Correct ply sock adjustment;Proper Donning;Proper wear schedule/adjustment;Skin check;Other (comment)   see prosthetic care comments   Person(s) Educated  Patient;Child(ren)    Education Method  Explanation;Demonstration;Tactile cues;Verbal cues    Education Method  Verbalized understanding;Returned demonstration;Tactile cues required;Verbal cues required;Needs further instruction    Donning Prosthesis  Minimal assist               PT Short Term Goals - 09/28/18 2306      PT SHORT TERM GOAL #1   Title  Patient verbalizes proper adjusting ply socks for volume changes. (All updated STGs Target Date: 10/23/2018)    Time  1    Period  Months    Status  New    Target Date  10/23/18      PT SHORT TERM GOAL #2   Title  Patient tolerates prosthesis wear >90%  of awake hours / day without skin issues.     Time  1    Period  Months    Status  New    Target Date  10/23/18      PT SHORT TERM GOAL #3   Title  Patient able to pick up items from floor with RW support with supervision.     Time  1    Period  Months    Status  New    Target Date  10/23/18  PT SHORT TERM GOAL #4   Title  Patient ambulates 250' with RW & prosthesis with supervision.     Time  1    Period  Months    Status  Revised    Target Date  10/23/18      PT SHORT TERM GOAL #5   Title  Patient negotiates ramps & curbs with RW & prosthesis with minA.     Time  1    Period  Months    Status  Revised    Target Date  10/23/18        PT Long Term Goals - 08/26/18 1820      PT LONG TERM GOAL #1   Title  Patient verbalizes & demonstrates understanding of prosthetic care to enable safe utilization of prosthesis (All LTGs Target Date: 11/20/2018)    Time  3    Period  Months    Status  On-going    Target Date  11/20/18      PT LONG TERM GOAL #2   Title  Patient tolerates wear of prosthesis >90% of awake hours without skin or limb pain isses.     Time  3    Period  Months    Status  On-going    Target Date  11/20/18      PT LONG TERM GOAL #3   Title  Standing balance with RW support reaching 12" & picking up items from floor and static without UE support for 2 minutes safely modified indenpendent.     Time  3    Period  Months    Status  On-going    Target Date  11/20/18      PT LONG TERM GOAL #4   Title  Patient ambulates 300' with RW & prosthesis modified independent.     Time  3    Period  Months    Status  On-going    Target Date  11/20/18      PT LONG TERM GOAL #5   Title  Patient negotiates ramps & curbs with RW & prosthesis modified independent for community access.    Time  3    Period  Months    Status  On-going    Target Date  11/20/18      PT LONG TERM GOAL #6   Title  Patient ambulates around furniture carrying plate safely modified  independent.     Time  3    Period  Months    Status  On-going    Target Date  11/20/18            Plan - 10/12/18 2146    Clinical Impression Statement  PT instructed in prosthetic positioning in sitting, managing sweat, redonning with liner slippage & pelvic orientation in gait to decrease rotation of prosthesis. Patient improved ability to negotiate ramps & curbs with rolling walker & prosthesis    Rehab Potential  Good    PT Frequency  2x / week    PT Duration  Other (comment)   13 weeks (90 days)   PT Treatment/Interventions  ADLs/Self Care Home Management;DME Instruction;Gait training;Stair training;Functional mobility training;Therapeutic activities;Therapeutic exercise;Balance training;Neuromuscular re-education;Patient/family education;Prosthetic Training;Vestibular    PT Next Visit Plan  work on standing balance, work towards updated STGs    Consulted and Agree with Plan of Care  Patient;Family member/caregiver    Family Member Consulted  son, Brett Canales       Patient will benefit from skilled therapeutic intervention in order to  improve the following deficits and impairments:  Abnormal gait, Decreased activity tolerance, Decreased balance, Decreased coordination, Decreased endurance, Decreased knowledge of use of DME, Decreased mobility, Decreased range of motion, Decreased strength, Dizziness, Postural dysfunction, Prosthetic Dependency  Visit Diagnosis: Unsteadiness on feet  Other abnormalities of gait and mobility  Muscle weakness (generalized)  Abnormal posture  History of falling     Problem List Patient Active Problem List   Diagnosis Date Noted  . Paroxysmal atrial fibrillation (HCC) 05/04/2018  . Gangrene of lower extremity (HCC)   . Subacute osteomyelitis, left ankle and foot (HCC)   . S/P ORIF (open reduction internal fixation) fracture 04/03/2018  . Wound infection 04/02/2018  . MRSA (methicillin resistant staph aureus) culture positive 04/02/2018   . Acute blood loss anemia 04/02/2018  . Closed left ankle fracture, with delayed healing, subsequent encounter 03/31/2018  . Essential hypertension, benign 06/23/2014  . Dyslipidemia 06/23/2014  . GERD (gastroesophageal reflux disease) 06/23/2014  . UTI (urinary tract infection) 06/23/2014  . Depression 06/23/2014    Vladimir Faster PT, DPT 10/12/2018, 9:57 PM  [facility] [address] [place], [place], [address] Phone: [phone]   Fax:  [phone]  Name: Ana Franco MRN: [id number] Date of Birth: February 24, 1926

## 2018-10-19 ENCOUNTER — Encounter: Payer: Self-pay | Admitting: Physical Therapy

## 2018-10-19 ENCOUNTER — Ambulatory Visit: Payer: Medicare Other | Admitting: Physical Therapy

## 2018-10-19 DIAGNOSIS — R293 Abnormal posture: Secondary | ICD-10-CM

## 2018-10-19 DIAGNOSIS — R2689 Other abnormalities of gait and mobility: Secondary | ICD-10-CM

## 2018-10-19 DIAGNOSIS — R2681 Unsteadiness on feet: Secondary | ICD-10-CM | POA: Diagnosis not present

## 2018-10-19 DIAGNOSIS — M6281 Muscle weakness (generalized): Secondary | ICD-10-CM

## 2018-10-19 DIAGNOSIS — Z9181 History of falling: Secondary | ICD-10-CM

## 2018-10-19 NOTE — Patient Instructions (Signed)
At counter top: 1) Walk sideways right & left 2) Open lower cabinet & place front half of one foot in cabinet. Try to hoover hands over sink for 10 seconds. Switch legs until 3 times on each leg. Stand in corner with walker in front of you: 1) Turn head right /left 10 times then up/down 10 times 2) Close eyes hoover hands over walker up to 10 seconds 3 times 3) Stand on pillow. Hoover hands over walker up to 10 seconds 3 times.

## 2018-10-19 NOTE — Therapy (Signed)
[facility] [address] [place], [place], [address] Phone: [phone]   Fax:  [phone]  Physical Therapy Treatment  Patient Details  Name: Ana Franco MRN: [id number] Date of Birth: 22-Apr-1926 Referring Provider (PT): Meridee Score, MD   Encounter Date: 10/19/2018  PT End of Session - 10/19/18 7867    Visit Number  9    Number of Visits  26    Date for PT Re-Evaluation  11/20/18    Authorization Type  [employer] Medicare.  Pt has no financial responsibility. Follow Medicare guidelines.     PT Start Time  1450    PT Stop Time  1533    PT Time Calculation (min)  43 min    Equipment Utilized During Treatment  Gait belt    Activity Tolerance  Patient tolerated treatment well;Patient limited by fatigue    Behavior During Therapy  WFL for tasks assessed/performed       Past Medical History:  Diagnosis Date  . Anemia    vitamin b 12 deficiency anemia  . 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 Chronic venous insufficiency   . Depression   . Difficulty in walking   . Displaced trimalleolar fracture of left lower leg   . Essential (primary) hypertension   . GERD (gastroesophageal reflux disease)   . History of blood transfusion    02/2018   . History of falling   . Hyperlipidemia   . Hyperlipidemia   . Hypertension   . 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 Iron deficiency   . Major depressive disorder   . Muscle weakness (generalized)   . Myocardial infarction ([place])    nonstemi mi - 02/19/2018 at [facility] after ankle fracture   . Nutritional deficiency, unspecified   . Obesity   . Other lack of coordination   . Unspecified atrial fibrillation ([place])   . Unspecified osteoarthritis, unspecified site     Past Surgical History:  Procedure Laterality Date  . AMPUTATION Left  04/10/2018   Procedure: LEFT BELOW KNEE AMPUTATION;  Surgeon: Newt Minion, MD;  Location: [facility];  Service: Orthopedics;  Laterality: Left;  . APPENDECTOMY    . APPLICATION OF WOUND VAC Left 03/31/2018   Procedure: APPLICATION OF WOUND VAC;  Surgeon: Renette Butters, MD;  Location: [facility];  Service: Orthopedics;  Laterality: Left;  . HARDWARE REMOVAL Left 03/31/2018   Procedure: HARDWARE REMOVAL;  Surgeon: Renette Butters, MD;  [facility];  Service: Orthopedics;  Laterality: Left;  . IRRIGATION AND DEBRIDEMENT FOOT Left 03/31/2018   Procedure: IRRIGATION AND DEBRIDEMENT OF LEFT ANKLE;  Surgeon: Renette Butters, MD;  Location[facility];  Service: Orthopedics;  Laterality: Left;  . ORIF ANKLE FRACTURE Right 06/13/2014   Procedure: OPEN REDUCTION INTERNAL FIXATION (ORIF) BIMALLEOLAR ANKLE FRACTURE;  Surgeon: Ninetta Lights, MD;  Location: [facility];  Service: Orthopedics;  Laterality: Right;    There were no vitals filed for this visit.  Subjective Assessment - 10/19/18 1505    Subjective  She is wearing prosthesis most of awake hours. It continues to rotate.     Patient is accompained by:  Family member    Pertinent History  L TTA, depression, HTN, HLD, MI, OA, A-Fib,    Limitations  Lifting;Standing;Walking;House hold activities    Patient Stated Goals  To learn to use prosthesis  and live alone again.     Currently in Pain?  No/denies                       [facility] Adult PT Treatment/Exercise - 10/19/18 1450      Transfers   Transfers  Sit to Stand;Stand to Sit    Sit to Stand  5: Supervision;With upper extremity assist;From chair/3-in-1;With armrests   working on stabilizing without touching RW   Stand to Sit  5: Supervision;With upper extremity assist;To chair/3-in-1;With armrests   working on stabilizing without touching RW     Ambulation/Gait   Ambulation/Gait  Yes    Ambulation/Gait Assistance  5: Supervision    Ambulation/Gait Assistance Details  verbal cues  on upright posture    Ambulation Distance (Feet)  200 Feet   200' X 2   Assistive device  Rolling walker;Prosthesis    Gait Pattern  Step-through pattern;Decreased stance time - left;Left hip hike;Antalgic;Lateral hip instability;Trunk flexed;Abducted - left    Ambulation Surface  Indoor;Level    Ramp  4: Min assist   RW & prosthesis   Ramp Details (indicate cue type and reason)  cues on upright posture & wt shift over prosthesis in stance    Curb  4: Min assist   RW & prosthesis   Curb Details (indicate cue type and reason)  cues on sequence      High Level Balance   High Level Balance Activities  Side stepping    High Level Balance Comments  counter support, cues on prosthesis engagement      Neuro Re-ed    Neuro Re-ed Details   at counter modified Single leg stance with forefoot in lower cabinet hoovering hands over counter, 3 reps per LE.  Standing in corner with RW anteriorly: floor eyes open head turns right/left & up/down, static stance with eyes closed on floor & eyes open on foam.       Prosthetics   Prosthetic Care Comments   checking liner for slippage    Current prosthetic wear tolerance (days/week)   daily    Current prosthetic wear tolerance (#hours/day)   most of awake hours    Edema  pitting    Residual limb condition   redness on medial femoral condyle & tibial plateau decreased discoloration from bruise last week.  frail skin but no open areas.     Education Provided  Residual limb care;Correct ply sock adjustment;Proper Donning;Proper wear schedule/adjustment;Skin check;Other (comment)   see prosthetic care comments   Person(s) Educated  Patient;Child(ren)    Education Method  Explanation;Demonstration;Verbal cues    Education Method  Verbalized understanding;Returned demonstration;Tactile cues required;Verbal cues required;Needs further instruction    Donning Prosthesis  Supervision             PT Education - 10/19/18 1530    Education Details  balance  HEP see pt instructions    Person(s) Educated  Patient;Child(ren)    Methods  Explanation;Demonstration;Verbal cues;Handout    Comprehension  Verbalized understanding;Returned demonstration;Verbal cues required;Tactile cues required;Need further instruction       PT Short Term Goals - 10/19/18 2315      PT SHORT TERM GOAL #1   Title  Patient verbalizes proper adjusting ply socks for volume changes. (All updated STGs Target Date: 10/23/2018)    Baseline  MET 10/19/2018    Time  1    Period  Months    Status  Achieved    Target Date  10/23/18  PT SHORT TERM GOAL #2   Title  Patient tolerates prosthesis wear >90% of awake hours / day without skin issues.     Baseline  MET 10/19/2018    Time  1    Period  Months    Status  Achieved    Target Date  10/23/18      PT SHORT TERM GOAL #3   Title  Patient able to pick up items from floor with RW support with supervision.     Time  1    Period  Months    Status  On-going    Target Date  10/23/18      PT SHORT TERM GOAL #4   Title  Patient ambulates 250' with RW & prosthesis with supervision.     Time  1    Period  Months    Status  On-going    Target Date  10/23/18      PT SHORT TERM GOAL #5   Title  Patient negotiates ramps & curbs with RW & prosthesis with minA.     Baseline  MET 10/19/2018    Time  1    Period  Months    Status  Achieved    Target Date  10/23/18        PT Long Term Goals - 08/26/18 1820      PT LONG TERM GOAL #1   Title  Patient verbalizes & demonstrates understanding of prosthetic care to enable safe utilization of prosthesis (All LTGs Target Date: 11/20/2018)    Time  3    Period  Months    Status  On-going    Target Date  11/20/18      PT LONG TERM GOAL #2   Title  Patient tolerates wear of prosthesis >90% of awake hours without skin or limb pain isses.     Time  3    Period  Months    Status  On-going    Target Date  11/20/18      PT LONG TERM GOAL #3   Title  Standing balance with RW  support reaching 12" & picking up items from floor and static without UE support for 2 minutes safely modified indenpendent.     Time  3    Period  Months    Status  On-going    Target Date  11/20/18      PT LONG TERM GOAL #4   Title  Patient ambulates 300' with RW & prosthesis modified independent.     Time  3    Period  Months    Status  On-going    Target Date  11/20/18      PT LONG TERM GOAL #5   Title  Patient negotiates ramps & curbs with RW & prosthesis modified independent for community access.    Time  3    Period  Months    Status  On-going    Target Date  11/20/18      PT LONG TERM GOAL #6   Title  Patient ambulates around furniture carrying plate safely modified independent.     Time  3    Period  Months    Status  On-going    Target Date  11/20/18            Plan - 10/19/18 2316    Clinical Impression Statement  Patient met 3 STGs checked today and anticipate meeting other 2 STGs next session. Pt & son appear to understand  HEP to work on balance.     Rehab Potential  Good    PT Frequency  2x / week    PT Duration  Other (comment)   13 weeks (90 days)   PT Treatment/Interventions  ADLs/Self Care Home Management;DME Instruction;Gait training;Stair training;Functional mobility training;Therapeutic activities;Therapeutic exercise;Balance training;Neuromuscular re-education;Patient/family education;Prosthetic Training;Vestibular    PT Next Visit Plan  work on standing balance,  check remaining updated STGs    Consulted and Agree with Plan of Care  Patient;Family member/caregiver    Family Member Consulted  son, Richardson Landry       Patient will benefit from skilled therapeutic intervention in order to improve the following deficits and impairments:  Abnormal gait, Decreased activity tolerance, Decreased balance, Decreased coordination, Decreased endurance, Decreased knowledge of use of DME, Decreased mobility, Decreased range of motion, Decreased strength, Dizziness,  Postural dysfunction, Prosthetic Dependency  Visit Diagnosis: Unsteadiness on feet  Other abnormalities of gait and mobility  Muscle weakness (generalized)  Abnormal posture  History of falling     Problem List Patient Active Problem List   Diagnosis Date Noted  . Paroxysmal atrial fibrillation ([place]) 05/04/2018  . Gangrene of lower extremity ([place])   . Subacute osteomyelitis, left ankle and foot ([place])   . S/P ORIF (open reduction internal fixation) fracture 04/03/2018  . Wound infection 04/02/2018  . MRSA (methicillin resistant staph aureus) culture positive 04/02/2018  . Acute blood loss anemia 04/02/2018  . Closed left ankle fracture, with delayed healing, subsequent encounter 03/31/2018  . Essential hypertension, benign 06/23/2014  . Dyslipidemia 06/23/2014  . GERD (gastroesophageal reflux disease) 06/23/2014  . UTI (urinary tract infection) 06/23/2014  . Depression 06/23/2014    Jamey Reas PT, DPT 10/19/2018, 11:18 PM  [facility] [address] [place], [place], [address] Phone: [phone]   Fax:  [phone]  Name: Ana Franco MRN: [id number] Date of Birth: 08-Oct-1925

## 2018-10-26 ENCOUNTER — Ambulatory Visit (INDEPENDENT_AMBULATORY_CARE_PROVIDER_SITE_OTHER): Payer: Medicare Other | Admitting: Orthopedic Surgery

## 2018-10-26 ENCOUNTER — Encounter (INDEPENDENT_AMBULATORY_CARE_PROVIDER_SITE_OTHER): Payer: Self-pay | Admitting: Orthopedic Surgery

## 2018-10-26 ENCOUNTER — Other Ambulatory Visit: Payer: Self-pay

## 2018-10-26 ENCOUNTER — Encounter: Payer: Self-pay | Admitting: Physical Therapy

## 2018-10-26 ENCOUNTER — Ambulatory Visit: Payer: Medicare Other | Admitting: Physical Therapy

## 2018-10-26 VITALS — Ht 66.0 in | Wt 211.0 lb

## 2018-10-26 DIAGNOSIS — Z89512 Acquired absence of left leg below knee: Secondary | ICD-10-CM | POA: Diagnosis not present

## 2018-10-26 DIAGNOSIS — M6281 Muscle weakness (generalized): Secondary | ICD-10-CM

## 2018-10-26 DIAGNOSIS — Z9181 History of falling: Secondary | ICD-10-CM

## 2018-10-26 DIAGNOSIS — R2689 Other abnormalities of gait and mobility: Secondary | ICD-10-CM

## 2018-10-26 DIAGNOSIS — R2681 Unsteadiness on feet: Secondary | ICD-10-CM | POA: Diagnosis not present

## 2018-10-26 DIAGNOSIS — R293 Abnormal posture: Secondary | ICD-10-CM

## 2018-10-26 NOTE — Progress Notes (Signed)
Office Visit Note   Patient: Ana Franco           Date of Birth: Sep 12, 1925           MRN: [id number] Visit Date: 10/26/2018              Requested by: Gaspar Garbe, MD [address] [place], [place] [address] PCP: Wylene Simmer Adelfa Koh, MD  Chief Complaint  Patient presents with  . Left Leg - Wound Check, Follow-up      HPI: Patient is a 85 year old woman who presents follow-up for left transtibial amputation.  Patient is able to ambulate independently around the house has to be assisted going up and down the ramp at her home.  She states she has had significant decrease in the residual volume and does not have stability with her prosthetic at this time and is concerned about falling.  The prosthesis has been modified with padding to provide a better fit.  Assessment & Plan: Visit Diagnoses:  1. 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)     Plan: Patient was given a prescription for biotech for a new socket new liner new sleeve.  Follow-Up Instructions: Return if symptoms worsen or fail to improve.   Ortho Exam  Patient is alert, oriented, no adenopathy, well-dressed, normal affect, normal respiratory effort. Examination patient has no rash or ulceration on her leg.  She has no stability with varus and valgus or rotational stress on her leg.  She is currently wearing over 15 ply sock.  Imaging: No results found. No images are attached to the encounter.  Labs: Lab Results  Component Value Date   REPTSTATUS 04/05/2018 FINAL 03/31/2018   GRAMSTAIN  03/31/2018    NO WBC SEEN RARE GRAM POSITIVE COCCI IN PAIRS Gram Stain Report Called to,Read Back By and Verified With: T.GREEN AT 1450 ON 03/31/18 BY N.THOMPSON Performed at [facility], [address] [place], [place] [address]    CULT  03/31/2018    FEW METHICILLIN RESISTANT STAPHYLOCOCCUS AUREUS NO ANAEROBES ISOLATED Performed at [facility], [address],  [place], [place] [address]    LABORGA METHICILLIN RESISTANT STAPHYLOCOCCUS AUREUS 03/31/2018     Lab Results  Component Value Date   ALBUMIN 2.7 (L) 04/04/2018   ALBUMIN 3.4 (L) 06/12/2014    Body mass index is 34.06 kg/m.  Orders:  No orders of the defined types were placed in this encounter.  No orders of the defined types were placed in this encounter.    Procedures: No procedures performed  Clinical Data: No additional findings.  ROS:  All other systems negative, except as noted in the HPI. Review of Systems  Objective: Vital Signs: Ht 5\' 6"  (1.676 m)   Wt 211 lb (95.7 kg)   BMI 34.06 kg/m   Specialty Comments:  No specialty comments available.  PMFS History: Patient Active Problem List   Diagnosis Date Noted  . Paroxysmal atrial fibrillation ([facility]) 05/04/2018  . Gangrene of lower extremity ([facility])   . Subacute osteomyelitis, left ankle and foot ([facility])   . S/P ORIF (open reduction internal fixation) fracture 04/03/2018  . Wound infection 04/02/2018  . MRSA (methicillin resistant staph aureus) culture positive 04/02/2018  . Acute blood loss anemia 04/02/2018  . Closed left ankle fracture, with delayed healing, subsequent encounter 03/31/2018  . Essential hypertension, benign 06/23/2014  . Dyslipidemia 06/23/2014  . GERD (gastroesophageal reflux disease) 06/23/2014  . UTI (urinary tract infection) 06/23/2014  .  Depression 06/23/2014   Past Medical History:  Diagnosis Date  . Anemia    vitamin b 12 deficiency anemia  . 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 Chronic venous insufficiency   . Depression   . Difficulty in walking   . Displaced trimalleolar fracture of left lower leg   . Essential (primary) hypertension   . GERD (gastroesophageal reflux disease)   . History of blood transfusion    02/2018   . History of falling   . Hyperlipidemia   . Hyperlipidemia   . Hypertension   . Infection and inflammatory reaction due to other  internal orthopedic prosthetic devices, implants and grafts, subsequent encounter   . Iron deficiency   . Major depressive disorder   . Muscle weakness (generalized)   . Myocardial infarction ([facility])    nonstemi mi - 02/19/2018 at [facility] after ankle fracture   . Nutritional deficiency, unspecified   . Obesity   . Other lack of coordination   . Unspecified atrial fibrillation ([facility])   . Unspecified osteoarthritis, unspecified site     History reviewed. No pertinent family history.  Past Surgical History:  Procedure Laterality Date  . AMPUTATION Left 04/10/2018   Procedure: LEFT BELOW KNEE AMPUTATION;  Surgeon: Nadara Mustard, MD;  Location: [facility];  Service: Orthopedics;  Laterality: Left;  . APPENDECTOMY    . APPLICATION OF WOUND VAC Left 03/31/2018   Procedure: APPLICATION OF WOUND VAC;  Surgeon: Sheral Apley, MD;  [facility];  Service: Orthopedics;  Laterality: Left;  . HARDWARE REMOVAL Left 03/31/2018   Procedure: HARDWARE REMOVAL;  Surgeon: Sheral Apley, MD;  [facility];  Service: Orthopedics;  Laterality: Left;  . IRRIGATION AND DEBRIDEMENT FOOT Left 03/31/2018   Procedure: IRRIGATION AND DEBRIDEMENT OF LEFT ANKLE;  Surgeon: Sheral Apley, MD;  [facility];  Service: Orthopedics;  Laterality: Left;  . ORIF ANKLE FRACTURE Right 06/13/2014   Procedure: OPEN REDUCTION INTERNAL FIXATION (ORIF) BIMALLEOLAR ANKLE FRACTURE;  Surgeon: Loreta Ave, MD;  Location: [facility];  Service: Orthopedics;  Laterality: Right;   Social History   Occupational History  . Not on file  Tobacco Use  . Smoking status: Never Smoker  . Smokeless tobacco: Never Used  Substance and Sexual Activity  . Alcohol use: No  . Drug use: No  . Sexual activity: Not Currently

## 2018-10-27 NOTE — Therapy (Signed)
[facility] [address] [place], [place], [address] Phone: [phone]   Fax:  [phone]  Physical Therapy Treatment & 10th visit Progress Note  Patient Details  Name: Ana Franco MRN: [id number] Date of Birth: 09/21/1925 Referring Provider (PT): Meridee Score, MD   Encounter Date: 10/26/2018   Progress Note Reporting Period 08/24/2018 to 10/26/2018  See note below for Objective Data and Assessment of Progress/Goals.       PT End of Session - 10/26/18 1414    Visit Number  10    Number of Visits  26    Date for PT Re-Evaluation  11/20/18    Authorization Type  [employer] Medicare.  Pt has no financial responsibility. Follow Medicare guidelines.     PT Start Time  1315    PT Stop Time  1400    PT Time Calculation (min)  45 min    Equipment Utilized During Treatment  Gait belt    Activity Tolerance  Patient tolerated treatment well;Patient limited by fatigue    Behavior During Therapy  WFL for tasks assessed/performed       Past Medical History:  Diagnosis Date  . Anemia    vitamin b 12 deficiency anemia  . 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 Chronic venous insufficiency   . Depression   . Difficulty in walking   . Displaced trimalleolar fracture of left lower leg   . Essential (primary) hypertension   . GERD (gastroesophageal reflux disease)   . History of blood transfusion    02/2018   . History of falling   . Hyperlipidemia   . Hyperlipidemia   . Hypertension   . 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 Iron deficiency   . Major depressive disorder   . Muscle weakness (generalized)   . Myocardial infarction ([place])    nonstemi mi - 02/19/2018 at [facility] after ankle fracture   . Nutritional deficiency, unspecified   . Obesity   . Other lack of coordination    . Unspecified atrial fibrillation ([place])   . Unspecified osteoarthritis, unspecified site     Past Surgical History:  Procedure Laterality Date  . AMPUTATION Left 04/10/2018   Procedure: LEFT BELOW KNEE AMPUTATION;  Surgeon: Newt Minion, MD;  Location: [facility];  Service: Orthopedics;  Laterality: Left;  . APPENDECTOMY    . APPLICATION OF WOUND VAC Left 03/31/2018   Procedure: APPLICATION OF WOUND VAC;  Surgeon: Renette Butters, MD;  Location: [facility];  Service: Orthopedics;  Laterality: Left;  . HARDWARE REMOVAL Left 03/31/2018   Procedure: HARDWARE REMOVAL;  Surgeon: Renette Butters, MD;  Location: [facility];  Service: Orthopedics;  Laterality: Left;  . IRRIGATION AND DEBRIDEMENT FOOT Left 03/31/2018   Procedure: IRRIGATION AND DEBRIDEMENT OF LEFT ANKLE;  Surgeon: Renette Butters, MD;  Location: [facility];  Service: Orthopedics;  Laterality: Left;  . ORIF ANKLE FRACTURE Right 06/13/2014   Procedure: OPEN REDUCTION INTERNAL FIXATION (ORIF) BIMALLEOLAR ANKLE FRACTURE;  Surgeon: Ninetta Lights, MD;  Location: [facility];  Service: Orthopedics;  Laterality: Right;    There were no vitals filed for this visit.  Subjective Assessment - 10/26/18 1315    Subjective  She is wearing prosthesis most of awake hours. Dr. Sharol Given said needs new socket.    Patient is accompained by:  Family member  Pertinent History  L TTA, depression, HTN, HLD, MI, OA, A-Fib,    Limitations  Lifting;Standing;Walking;House hold activities    Patient Stated Goals  To learn to use prosthesis and live alone again.     Currently in Pain?  No/denies                       [facility] Adult PT Treatment/Exercise - 10/26/18 1315      Transfers   Transfers  Sit to Stand;Stand to Sit    Sit to Stand  5: Supervision;With upper extremity assist;From chair/3-in-1;With armrests   working on stabilizing without touching RW   Stand to Sit  5: Supervision;With upper extremity assist;To chair/3-in-1;With armrests   working on  stabilizing without touching RW     Ambulation/Gait   Ambulation/Gait  Yes    Ambulation/Gait Assistance  5: Supervision    Ambulation Distance (Feet)  250 Feet    Assistive device  Rolling walker;Prosthesis    Gait Pattern  Step-through pattern;Decreased stance time - left;Left hip hike;Antalgic;Lateral hip instability;Trunk flexed;Abducted - left    Ambulation Surface  Level;Indoor    Ramp  4: Min assist   min guard, RW & prosthesis   Ramp Details (indicate cue type and reason)  verbal cues on technique    Curb  4: Min assist   RW & prosthesis   Curb Details (indicate cue type and reason)  verbal cues on technique      High Level Balance   High Level Balance Activities  Side stepping;Backward walking    High Level Balance Comments  cues on prosthesis engagement      Neuro Re-ed    Neuro Re-ed Details   corner posterior & RW anterior intermittent support with feet hip width apart:  head turns right/left 10 reps & up/down 10 reps with intermittent touch;  static stance with eyes closed 10 seconds 3 reps.       Prosthetics   Prosthetic Care Comments   checking liner for slippage    Current prosthetic wear tolerance (days/week)   daily    Current prosthetic wear tolerance (#hours/day)   most of awake hours    Edema  pitting    Residual limb condition   redness on medial femoral condyle & tibial plateau decreased discoloration from bruise last week.  frail skin but no open areas.     Education Provided  Residual limb care;Correct ply sock adjustment;Proper Donning;Proper wear schedule/adjustment;Skin check;Other (comment)   see prosthetic care comments   Person(s) Educated  Patient;Child(ren)    Education Method  Explanation;Verbal cues    Education Method  Verbalized understanding;Verbal cues required    Donning Prosthesis  Supervision               PT Short Term Goals - 10/26/18 1800      PT SHORT TERM GOAL #1   Title  Patient verbalizes proper adjusting ply socks for  volume changes. (All updated STGs Target Date: 10/23/2018)    Baseline  MET 10/19/2018    Time  1    Period  Months    Status  Achieved    Target Date  10/23/18      PT SHORT TERM GOAL #2   Title  Patient tolerates prosthesis wear >90% of awake hours / day without skin issues.     Baseline  MET 10/19/2018    Time  1    Period  Months    Status  Achieved  Target Date  10/23/18      PT SHORT TERM GOAL #3   Title  Patient able to pick up items from floor with RW support with supervision.     Baseline  MET 10/26/2018    Time  1    Period  Months    Status  Achieved    Target Date  10/23/18      PT SHORT TERM GOAL #4   Title  Patient ambulates 250' with RW & prosthesis with supervision.     Baseline  MET 10/26/2018    Time  1    Period  Months    Status  Achieved    Target Date  10/23/18      PT SHORT TERM GOAL #5   Title  Patient negotiates ramps & curbs with RW & prosthesis with minA.     Baseline  MET 10/19/2018    Time  1    Period  Months    Status  Achieved    Target Date  10/23/18        PT Long Term Goals - 08/26/18 1820      PT LONG TERM GOAL #1   Title  Patient verbalizes & demonstrates understanding of prosthetic care to enable safe utilization of prosthesis (All LTGs Target Date: 11/20/2018)    Time  3    Period  Months    Status  On-going    Target Date  11/20/18      PT LONG TERM GOAL #2   Title  Patient tolerates wear of prosthesis >90% of awake hours without skin or limb pain isses.     Time  3    Period  Months    Status  On-going    Target Date  11/20/18      PT LONG TERM GOAL #3   Title  Standing balance with RW support reaching 12" & picking up items from floor and static without UE support for 2 minutes safely modified indenpendent.     Time  3    Period  Months    Status  On-going    Target Date  11/20/18      PT LONG TERM GOAL #4   Title  Patient ambulates 300' with RW & prosthesis modified independent.     Time  3    Period  Months     Status  On-going    Target Date  11/20/18      PT LONG TERM GOAL #5   Title  Patient negotiates ramps & curbs with RW & prosthesis modified independent for community access.    Time  3    Period  Months    Status  On-going    Target Date  11/20/18      PT LONG TERM GOAL #6   Title  Patient ambulates around furniture carrying plate safely modified independent.     Time  3    Period  Months    Status  On-going    Target Date  11/20/18            Plan - 10/27/18 1234    Clinical Impression Statement  patient met all STGs set for this 30 day period. She is on target to meet LTGs by end of plan of care in 30 more days. Today's skilled PT session also focused on balance reactions.     Rehab Potential  Good    PT Frequency  2x / week    PT  Duration  Other (comment)   13 weeks (90 days)   PT Treatment/Interventions  ADLs/Self Care Home Management;DME Instruction;Gait training;Stair training;Functional mobility training;Therapeutic activities;Therapeutic exercise;Balance training;Neuromuscular re-education;Patient/family education;Prosthetic Training;Vestibular    PT Next Visit Plan  work towards updated LTGs, balance training    Consulted and Agree with Plan of Care  Patient;Family member/caregiver    Family Member Consulted  son, Richardson Landry       Patient will benefit from skilled therapeutic intervention in order to improve the following deficits and impairments:  Abnormal gait, Decreased activity tolerance, Decreased balance, Decreased coordination, Decreased endurance, Decreased knowledge of use of DME, Decreased mobility, Decreased range of motion, Decreased strength, Dizziness, Postural dysfunction, Prosthetic Dependency  Visit Diagnosis: Unsteadiness on feet  Other abnormalities of gait and mobility  Muscle weakness (generalized)  Abnormal posture  History of falling     Problem List Patient Active Problem List   Diagnosis Date Noted  . Paroxysmal atrial fibrillation  ([place]) 05/04/2018  . Gangrene of lower extremity ([place])   . Subacute osteomyelitis, left ankle and foot ([place])   . S/P ORIF (open reduction internal fixation) fracture 04/03/2018  . Wound infection 04/02/2018  . MRSA (methicillin resistant staph aureus) culture positive 04/02/2018  . Acute blood loss anemia 04/02/2018  . Closed left ankle fracture, with delayed healing, subsequent encounter 03/31/2018  . Essential hypertension, benign 06/23/2014  . Dyslipidemia 06/23/2014  . GERD (gastroesophageal reflux disease) 06/23/2014  . UTI (urinary tract infection) 06/23/2014  . Depression 06/23/2014    Jamey Reas PT, DPT 10/27/2018, 12:38 PM  [facility] [address] [place], [place], [address] Phone: [phone]   Fax:  [phone]  Name: Myrian Botello MRN: [id number] Date of Birth: 01-18-1926

## 2018-11-02 ENCOUNTER — Ambulatory Visit: Payer: Medicare Other | Admitting: Physical Therapy

## 2018-11-02 ENCOUNTER — Encounter: Payer: Self-pay | Admitting: Physical Therapy

## 2018-11-02 NOTE — Therapy (Signed)
[facility] [address] [place], [place], [address] Phone: [phone]   Fax:  [phone]  Patient Details  Name: Ana Franco MRN: [id number] Date of Birth: 1926/05/12   Encounter Date: 11/02/2018  Brett Canales, her son was contacted today regarding the temporary closing of [facility] due to [place].  Therapist discussed:  Continue to wear prosthesis, continue with HEP.   Patient is not interested in further information for an e-visit, virtual check in, or telehealth visit, if those services become available.    [facility] will follow up with patients when we are able to resume care.  Vladimir Faster, PT, DPT [facility] [address] [place], [place]  [address] Phone:  [phone] Fax:  [phone] Vladimir Faster  PT, DPT 11/02/2018, 1:21 PM  [facility] [address] [place], [place], [address] Phone: [phone]   Fax:  [phone]

## 2018-11-09 ENCOUNTER — Encounter: Payer: Self-pay | Admitting: Physical Therapy

## 2018-11-16 ENCOUNTER — Encounter: Payer: Self-pay | Admitting: Physical Therapy

## 2018-12-31 IMAGING — CT CT ANKLE*L* W/O CM
5 of 7 series · 14 of 33 positions shown, 15 images · non-contrast
Comparison: None.

CLINICAL DATA: The patient suffered a trimalleolar left ankle
fracture in a fall 02/19/2018 and underwent subsequent open
reduction internal fixation. Lateral ankle infection.

EXAM:
CT OF THE LEFT ANKLE WITHOUT CONTRAST
TECHNIQUE: Multidetector CT imaging of the left ankle was performed according
to the standard protocol. Multiplanar CT image reconstructions were
also generated.

[Series 2: sfov lower extremity 2.00 br60 s3 ax · axial · 0.29mm/px · z∈[+862,+940]mm · 2 of 119 slices shown]
[im 40/119  bone]
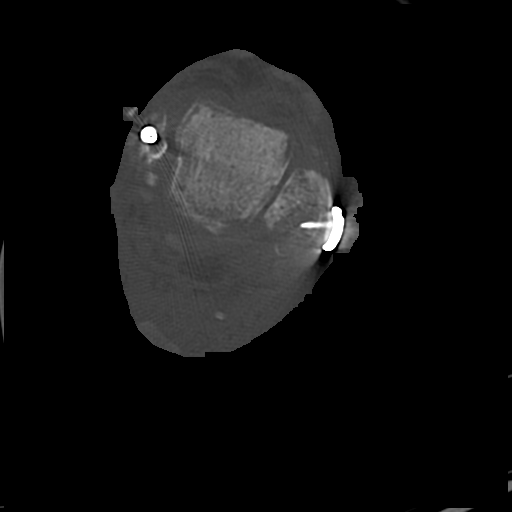
[im 79/119  bone]
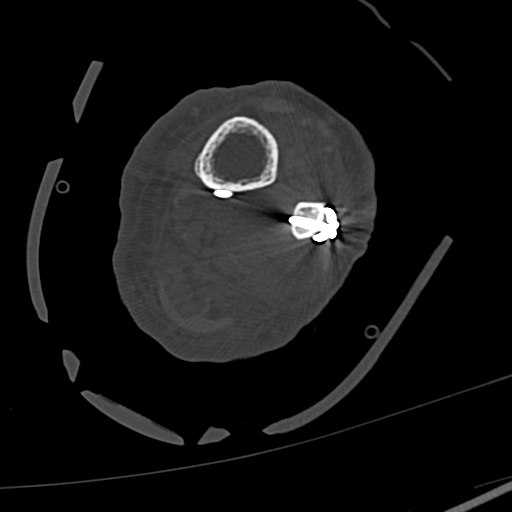

[Series 4: sfov lower extremity 2.00 br60 s3 cor · coronal · 0.29mm/px · 1 of 75 slices shown]
[im 38/75  bone]
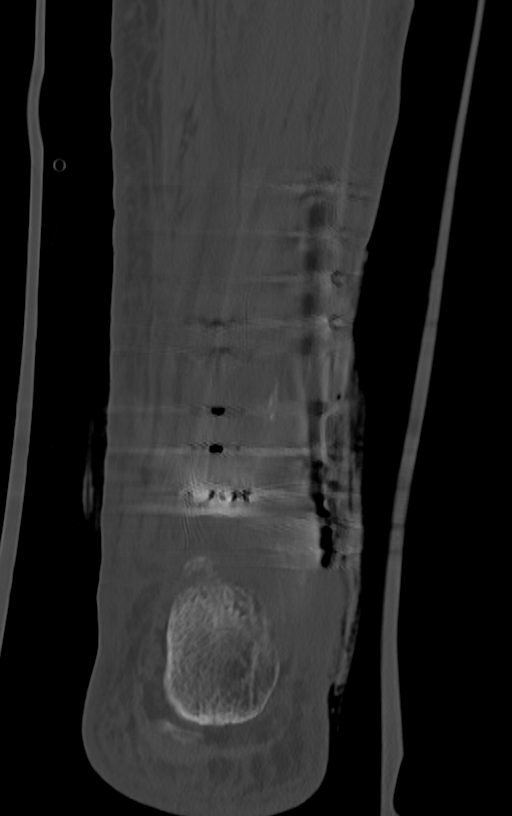

[Series 6: sfov lower extremity 2.00 br60 s3 sag · sagittal · 0.29mm/px · 5 of 75 slices shown]
[im 13/75  bone]
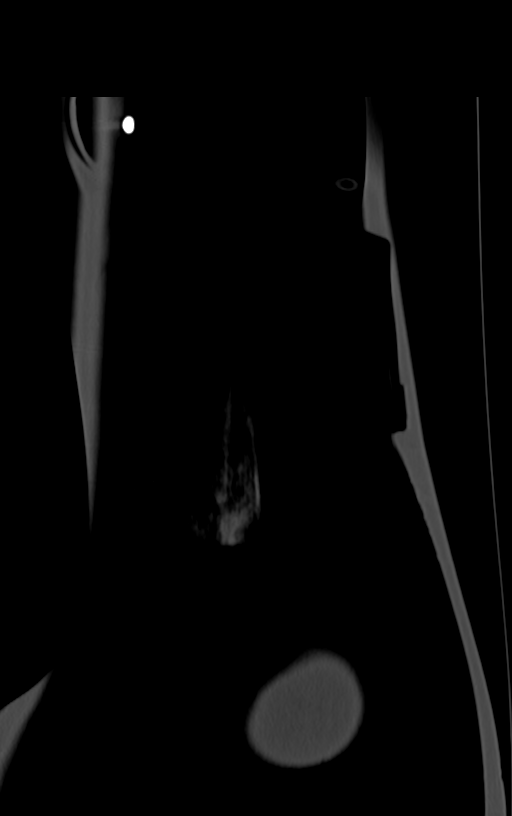
[im 25/75  bone]
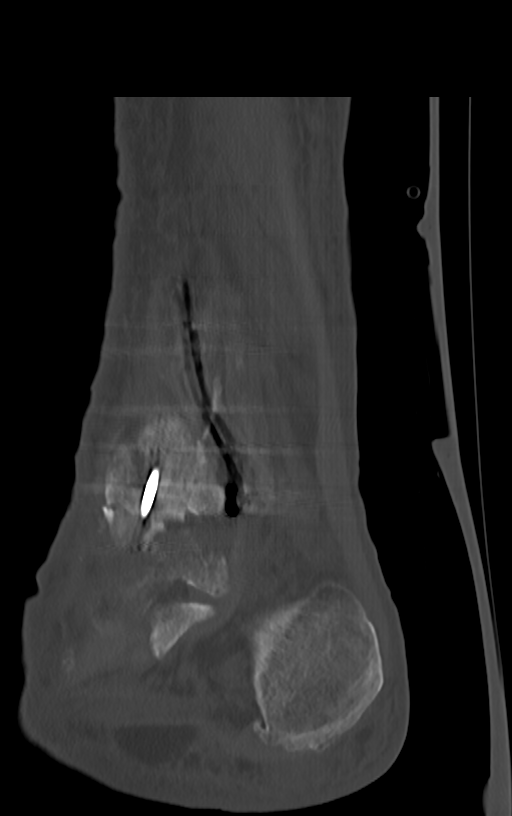
[im 38/75  bone]
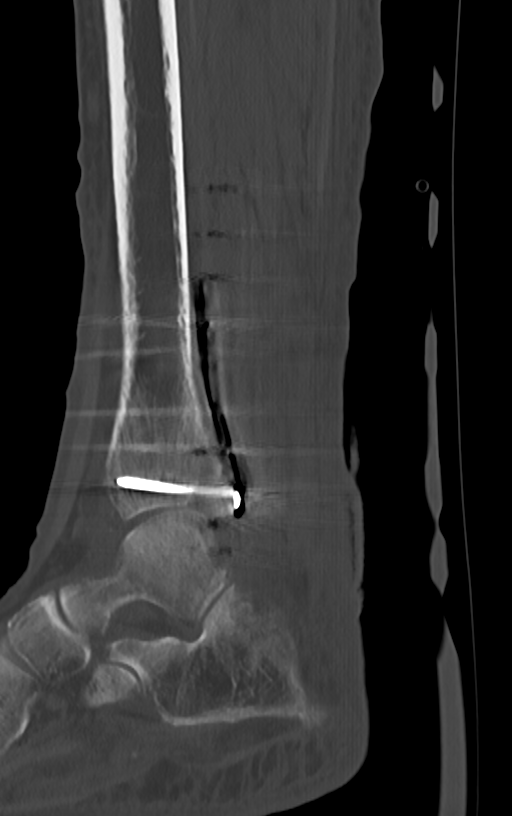
[im 50/75  bone]
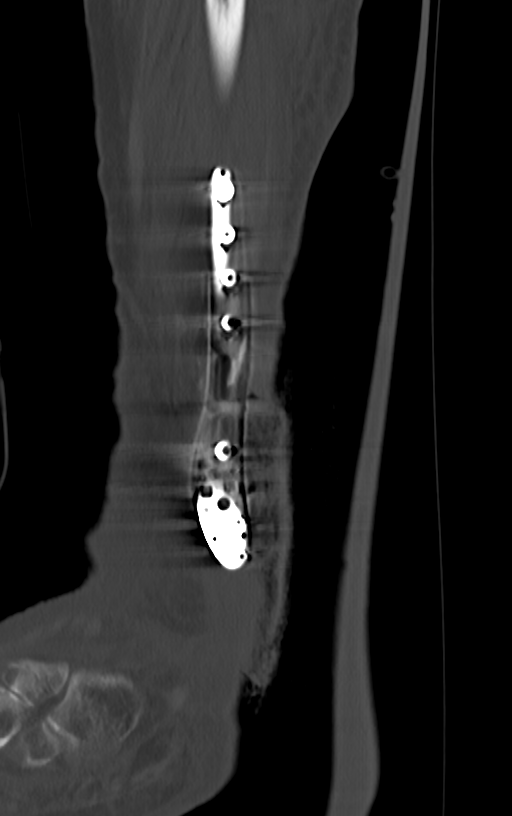
[im 62/75  bone]
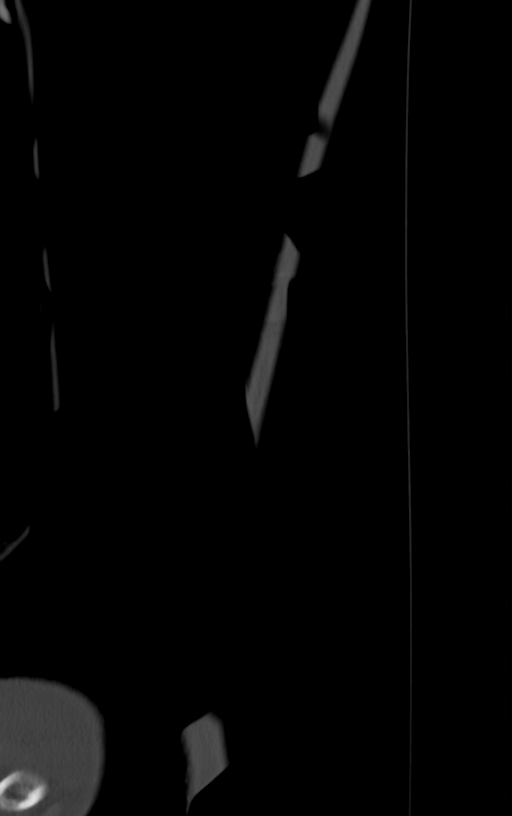

[Series 8: sfov lower extremity 2.00 br40 s3 ax · axial · 0.29mm/px · z∈[+862,+940]mm · 2 of 119 slices shown]
[im 40/119  bone]
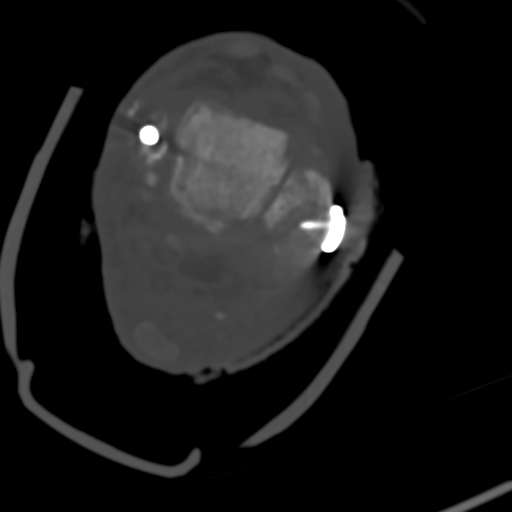
[im 79/119  bone]
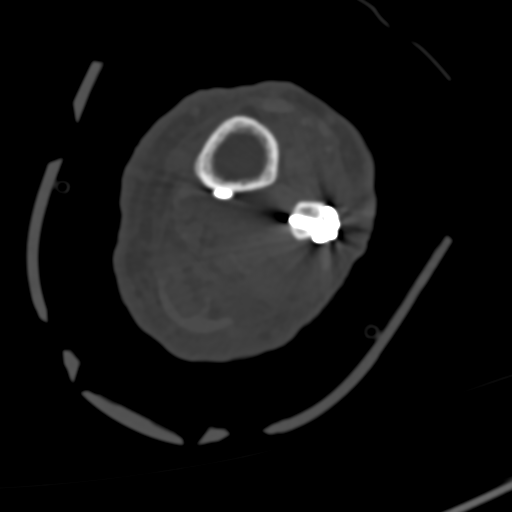

[Series 16: sfov lower extremity 1.50 br40 s3 (person_name) · axial · 0.28mm/px · z∈[+828,+976]mm · 4 of 166 slices shown, 5 images]
[im 34/166  soft-tissue]
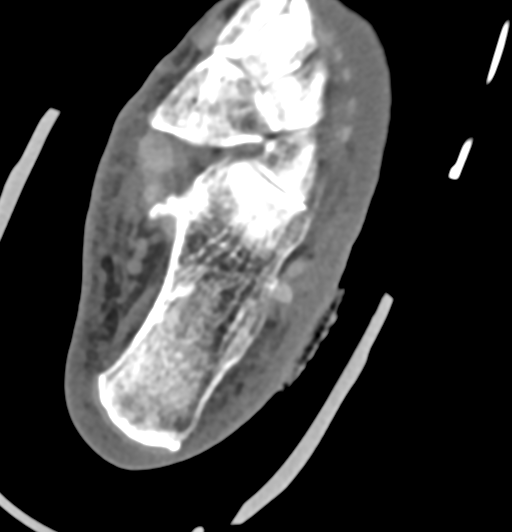
[im 34/166  bone]
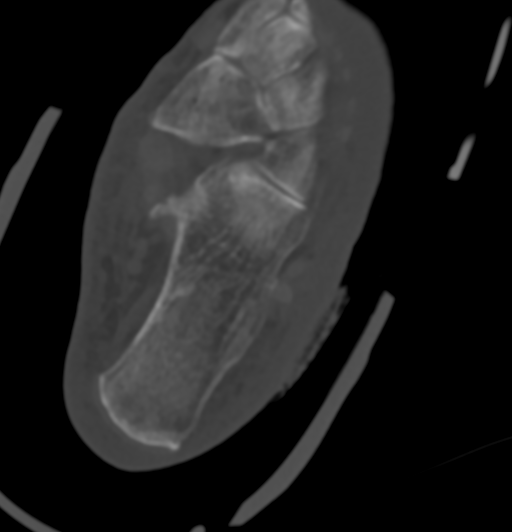
[im 67/166  bone]
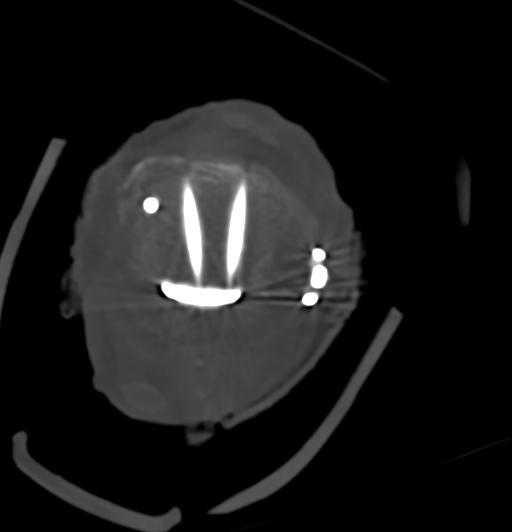
[im 100/166  bone]
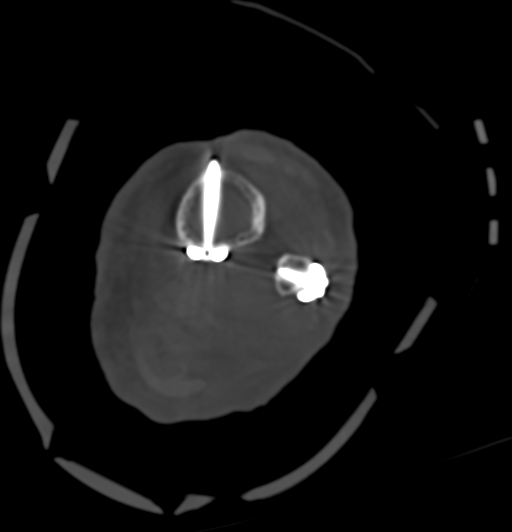
[im 133/166  bone]
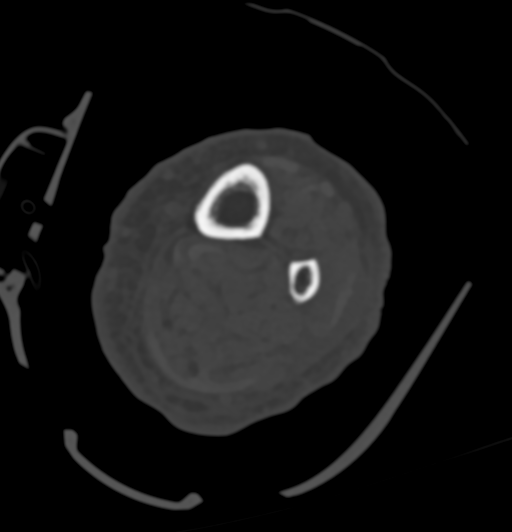

[14 of 33 positions shown; findings below may reference images not displayed]

FINDINGS: Bones/Joint/Cartilage

The patient is status post fixation of medial, the patient is status
post fixation of a medial malleolar fracture with a single screw in
place. Posterior plate and screws for fixation of a posterior
malleolar fracture also identified. Finally, the patient is status
post lateral plate and screw fixation of a distal fibular fracture.
All hardware is intact without evidence of loosening.

A gap at the posterior aspect of the tibial plafond and measures
cm AP by approximately 1.6 cm transverse. There is some bridging
bone about all of the patient's fractures.

A defect in the anterior cortex of the distal fibula measures 2 cm
craniocaudal. Several small cortical fragments are seen along the
anterior aspect of the distal fibula at the level of this cortical
defect. There is some periosteal reaction along the distal
metaphysis of the tibia.

No acute fracture is identified.

Ligaments

Suboptimally assessed by CT.

Muscles and Tendons

Visualization is limited due to streak artifact from hardware but no
tear is identified. No intramuscular fluid collection is seen. No
gas within muscle or tracking along fascial planes is present.

Soft tissues

There is subcutaneous edema about the ankle. Bandaging is present
over the lateral malleolus. No underlying focal fluid collection,
gas or unexpected radiopaque foreign body is identified.
IMPRESSION: Soft tissue wound over the lateral malleolus. No underlying foreign
body or soft tissue gas identified. There is a defect in the
anterior cortex of the distal fibula deep to the wound which could
be posttraumatic or surgical but could also be secondary to
osteomyelitis.

Status post fixation of trimalleolar fractures. There is evidence of
healing of all the patient's fractures. Hardware is intact without
evidence of loosening.

## 2019-05-24 ENCOUNTER — Encounter: Payer: Self-pay | Admitting: Physical Therapy

## 2019-05-24 NOTE — Therapy (Signed)
[facility] [address] [place], [place], [address] Phone: [phone]   Fax:  [phone]  Patient Details  Name: Ana Franco MRN: [id number] Date of Birth: 06/29/1926 Referring Provider:  Marcus Duda, MD  Encounter Date: 05/24/2019  PHYSICAL THERAPY DISCHARGE SUMMARY  Visits from Start of Care: 10  Current functional level related to goals / functional outcomes: See last PT note on 10/26/2018   Remaining deficits: [facility] initially closed due to COVID pandemic and reopened with new protocols. Patient did not feel comfortable returning to clinic and has not called to reschedule further appointments.   Education / Equipment: Prosthetic care Plan: Patient agrees to discharge.  Patient goals were not met. Patient is being discharged due to not returning since the last visit.  ?????         WALDRON,ROBIN PT, DPT 05/24/2019, 12:15 PM  [facility] [address] [place], [place], [address] Phone: [phone]   Fax:  [phone] 

## 2019-10-10 ENCOUNTER — Ambulatory Visit: Payer: Medicare Other | Attending: Internal Medicine

## 2019-10-10 DIAGNOSIS — Z23 Encounter for immunization: Secondary | ICD-10-CM | POA: Insufficient documentation

## 2019-10-10 NOTE — Progress Notes (Signed)
   Covid-19 Vaccination Clinic  Name:  Ana Franco    MRN: [id number] DOB: March 26, 1926  10/10/2019  Ana Franco was observed post Covid-19 immunization for 15 minutes without incidence. She was provided with Vaccine Information Sheet and instruction to access the V-Safe system.   Ana Franco was instructed to call 911 with any severe reactions post vaccine: [name] Difficulty breathing  . Swelling of your face and throat  . A fast heartbeat  . A bad rash all over your body  . Dizziness and weakness    Immunizations Administered    Name Date Dose VIS Date Route   Pfizer COVID-19 Vaccine 10/10/2019  2:08 PM 0.3 mL 07/23/2019 Intramuscular   Manufacturer: [employer], [employer]   Lot: [id number]   NDC: [address]-4

## 2019-11-09 ENCOUNTER — Ambulatory Visit: Payer: Medicare Other | Attending: Internal Medicine

## 2019-11-09 DIAGNOSIS — Z23 Encounter for immunization: Secondary | ICD-10-CM

## 2019-11-09 NOTE — Progress Notes (Signed)
   Covid-19 Vaccination Clinic  Name:  Ana Franco    MRN: [id number] DOB: September 30, 1925  11/09/2019  Ms. Ana Franco was observed post Covid-19 immunization for 15 minutes without incident. She was provided with Vaccine Information Sheet and instruction to access the V-Safe system.   Ms. Ana Franco was instructed to call 911 with any severe reactions post vaccine: [name] Difficulty breathing  . Swelling of face and throat  . A fast heartbeat  . A bad rash all over body  . Dizziness and weakness   Immunizations Administered    Name Date Dose VIS Date Route   Pfizer COVID-19 Vaccine 11/09/2019 12:17 PM 0.3 mL 07/23/2019 Intramuscular   Manufacturer: [employer], [employer]   Lot: [id number]   NDC: [address]-9

## 2021-01-06 ENCOUNTER — Observation Stay (HOSPITAL_COMMUNITY): Payer: Medicare Other

## 2021-01-06 ENCOUNTER — Other Ambulatory Visit: Payer: Self-pay

## 2021-01-06 ENCOUNTER — Inpatient Hospital Stay (HOSPITAL_COMMUNITY)
Admission: EM | Admit: 2021-01-06 | Discharge: 2021-01-10 | DRG: 065 | Disposition: A | Discharge status: Skilled Nursing Facility | Payer: Medicare Other | Attending: Family Medicine | Admitting: Family Medicine

## 2021-01-06 ENCOUNTER — Emergency Department (HOSPITAL_COMMUNITY): Payer: Medicare Other

## 2021-01-06 DIAGNOSIS — G8191 Hemiplegia, unspecified affecting right dominant side: Secondary | ICD-10-CM | POA: Diagnosis present

## 2021-01-06 DIAGNOSIS — E785 Hyperlipidemia, unspecified: Secondary | ICD-10-CM | POA: Diagnosis not present

## 2021-01-06 DIAGNOSIS — I63432 Cerebral infarction due to embolism of left posterior cerebral artery: Secondary | ICD-10-CM | POA: Diagnosis not present

## 2021-01-06 DIAGNOSIS — K219 Gastro-esophageal reflux disease without esophagitis: Secondary | ICD-10-CM | POA: Diagnosis present

## 2021-01-06 DIAGNOSIS — N1832 Chronic kidney disease, stage 3b: Secondary | ICD-10-CM | POA: Insufficient documentation

## 2021-01-06 DIAGNOSIS — Z66 Do not resuscitate: Secondary | ICD-10-CM | POA: Diagnosis present

## 2021-01-06 DIAGNOSIS — Z20822 Contact with and (suspected) exposure to covid-19: Secondary | ICD-10-CM | POA: Diagnosis present

## 2021-01-06 DIAGNOSIS — I2510 Atherosclerotic heart disease of native coronary artery without angina pectoris: Secondary | ICD-10-CM | POA: Diagnosis present

## 2021-01-06 DIAGNOSIS — I10 Essential (primary) hypertension: Secondary | ICD-10-CM | POA: Diagnosis not present

## 2021-01-06 DIAGNOSIS — D696 Thrombocytopenia, unspecified: Secondary | ICD-10-CM | POA: Diagnosis present

## 2021-01-06 DIAGNOSIS — I872 Venous insufficiency (chronic) (peripheral): Secondary | ICD-10-CM | POA: Diagnosis present

## 2021-01-06 DIAGNOSIS — I480 Paroxysmal atrial fibrillation: Secondary | ICD-10-CM | POA: Diagnosis present

## 2021-01-06 DIAGNOSIS — Z9181 History of falling: Secondary | ICD-10-CM

## 2021-01-06 DIAGNOSIS — I639 Cerebral infarction, unspecified: Secondary | ICD-10-CM | POA: Diagnosis present

## 2021-01-06 DIAGNOSIS — R29701 NIHSS score 1: Secondary | ICD-10-CM | POA: Diagnosis not present

## 2021-01-06 DIAGNOSIS — R29898 Other symptoms and signs involving the musculoskeletal system: Secondary | ICD-10-CM

## 2021-01-06 DIAGNOSIS — R4701 Aphasia: Secondary | ICD-10-CM | POA: Diagnosis present

## 2021-01-06 DIAGNOSIS — I129 Hypertensive chronic kidney disease with stage 1 through stage 4 chronic kidney disease, or unspecified chronic kidney disease: Secondary | ICD-10-CM | POA: Diagnosis present

## 2021-01-06 DIAGNOSIS — R29810 Facial weakness: Secondary | ICD-10-CM | POA: Diagnosis present

## 2021-01-06 DIAGNOSIS — M1990 Unspecified osteoarthritis, unspecified site: Secondary | ICD-10-CM | POA: Diagnosis present

## 2021-01-06 DIAGNOSIS — F32A Depression, unspecified: Secondary | ICD-10-CM | POA: Diagnosis present

## 2021-01-06 DIAGNOSIS — Z89512 Acquired absence of left leg below knee: Secondary | ICD-10-CM

## 2021-01-06 DIAGNOSIS — N183 Chronic kidney disease, stage 3 unspecified: Secondary | ICD-10-CM | POA: Diagnosis present

## 2021-01-06 DIAGNOSIS — R479 Unspecified speech disturbances: Secondary | ICD-10-CM

## 2021-01-06 DIAGNOSIS — Z7982 Long term (current) use of aspirin: Secondary | ICD-10-CM

## 2021-01-06 DIAGNOSIS — I252 Old myocardial infarction: Secondary | ICD-10-CM

## 2021-01-06 DIAGNOSIS — I4892 Unspecified atrial flutter: Secondary | ICD-10-CM | POA: Diagnosis present

## 2021-01-06 DIAGNOSIS — I671 Cerebral aneurysm, nonruptured: Secondary | ICD-10-CM | POA: Diagnosis present

## 2021-01-06 DIAGNOSIS — D519 Vitamin B12 deficiency anemia, unspecified: Secondary | ICD-10-CM | POA: Diagnosis present

## 2021-01-06 DIAGNOSIS — G459 Transient cerebral ischemic attack, unspecified: Secondary | ICD-10-CM | POA: Diagnosis not present

## 2021-01-06 DIAGNOSIS — I63422 Cerebral infarction due to embolism of left anterior cerebral artery: Secondary | ICD-10-CM | POA: Diagnosis present

## 2021-01-06 DIAGNOSIS — Z79899 Other long term (current) drug therapy: Secondary | ICD-10-CM

## 2021-01-06 DIAGNOSIS — R29700 NIHSS score 0: Secondary | ICD-10-CM | POA: Diagnosis present

## 2021-01-06 LAB — CBC WITH DIFFERENTIAL/PLATELET
Abs Immature Granulocytes: 0.02 10*3/uL (ref 0.00–0.07)
Basophils Absolute: 0 10*3/uL (ref 0.0–0.1)
Basophils Relative: 0 %
Eosinophils Absolute: 0.1 10*3/uL (ref 0.0–0.5)
Eosinophils Relative: 1 %
HCT: 39.8 % (ref 36.0–46.0)
Hemoglobin: 12.6 g/dL (ref 12.0–15.0)
Immature Granulocytes: 0 %
Lymphocytes Relative: 13 %
Lymphs Abs: 1 10*3/uL (ref 0.7–4.0)
MCH: 30.3 pg (ref 26.0–34.0)
MCHC: 31.7 g/dL (ref 30.0–36.0)
MCV: 95.7 fL (ref 80.0–100.0)
Monocytes Absolute: 0.8 10*3/uL (ref 0.1–1.0)
Monocytes Relative: 10 %
Neutro Abs: 5.9 10*3/uL (ref 1.7–7.7)
Neutrophils Relative %: 76 %
Platelets: 146 10*3/uL — ABNORMAL LOW (ref 150–400)
RBC: 4.16 MIL/uL (ref 3.87–5.11)
RDW: 13.2 % (ref 11.5–15.5)
WBC: 7.8 10*3/uL (ref 4.0–10.5)
nRBC: 0 % (ref 0.0–0.2)

## 2021-01-06 LAB — COMPREHENSIVE METABOLIC PANEL
ALT: 17 U/L (ref 0–44)
AST: 28 U/L (ref 15–41)
Albumin: 3.5 g/dL (ref 3.5–5.0)
Alkaline Phosphatase: 77 U/L (ref 38–126)
Anion gap: 9 (ref 5–15)
BUN: 29 mg/dL — ABNORMAL HIGH (ref 8–23)
CO2: 24 mmol/L (ref 22–32)
Calcium: 8.9 mg/dL (ref 8.9–10.3)
Chloride: 102 mmol/L (ref 98–111)
Creatinine, Ser: 1.17 mg/dL — ABNORMAL HIGH (ref 0.44–1.00)
GFR, Estimated: 44 mL/min — ABNORMAL LOW (ref >60)
Glucose, Bld: 145 mg/dL — ABNORMAL HIGH (ref 70–99)
Potassium: 4.2 mmol/L (ref 3.5–5.1)
Sodium: 135 mmol/L (ref 135–145)
Total Bilirubin: 0.6 mg/dL (ref 0.3–1.2)
Total Protein: 6.3 g/dL — ABNORMAL LOW (ref 6.5–8.1)

## 2021-01-06 LAB — TSH: TSH: 1.672 u[IU]/mL (ref 0.350–4.500)

## 2021-01-06 LAB — RESP PANEL BY RT-PCR (FLU A&B, COVID) ARPGX2
Influenza A by PCR: NEGATIVE
Influenza B by PCR: NEGATIVE
SARS Coronavirus 2 by RT PCR: NEGATIVE

## 2021-01-06 LAB — PROTIME-INR
INR: 1 (ref 0.8–1.2)
Prothrombin Time: 12.9 seconds (ref 11.4–15.2)

## 2021-01-06 LAB — MAGNESIUM: Magnesium: 2 mg/dL (ref 1.7–2.4)

## 2021-01-06 MED ORDER — GADOBUTROL 1 MMOL/ML IV SOLN
5.0000 mL | Freq: Once | INTRAVENOUS | Status: AC | PRN
  Administered 2021-01-06: 5 mL via INTRAVENOUS

## 2021-01-06 MED ORDER — ASPIRIN 81 MG PO CHEW
81.0000 mg | CHEWABLE_TABLET | Freq: Every day | ORAL | Status: DC
  Administered 2021-01-06 – 2021-01-07 (×2): 81 mg via ORAL
  Filled 2021-01-06 (×3): qty 1

## 2021-01-06 MED ORDER — HYDRALAZINE HCL 20 MG/ML IJ SOLN: 5.0000 mg | INTRAMUSCULAR | Status: DC | PRN

## 2021-01-06 MED ORDER — EZETIMIBE 10 MG PO TABS
10.0000 mg | ORAL_TABLET | Freq: Every day | ORAL | Status: DC
  Administered 2021-01-06 – 2021-01-09 (×4): 10 mg via ORAL
  Filled 2021-01-06 (×4): qty 1

## 2021-01-06 MED ORDER — SENNOSIDES-DOCUSATE SODIUM 8.6-50 MG PO TABS: 1.0000 | ORAL_TABLET | Freq: Every evening | ORAL | Status: DC | PRN

## 2021-01-06 MED ORDER — EZETIMIBE-SIMVASTATIN 10-40 MG PO TABS
1.0000 | ORAL_TABLET | Freq: Every day | ORAL | Status: DC
  Filled 2021-01-06: qty 1

## 2021-01-06 MED ORDER — ACETAMINOPHEN 650 MG RE SUPP: 650.0000 mg | RECTAL | Status: DC | PRN

## 2021-01-06 MED ORDER — ACETAMINOPHEN 325 MG PO TABS: 650.0000 mg | ORAL_TABLET | ORAL | Status: DC | PRN

## 2021-01-06 MED ORDER — METOPROLOL TARTRATE 50 MG PO TABS
50.0000 mg | ORAL_TABLET | Freq: Two times a day (BID) | ORAL | Status: DC
  Administered 2021-01-06 – 2021-01-09 (×7): 50 mg via ORAL
  Filled 2021-01-06 (×7): qty 1

## 2021-01-06 MED ORDER — STROKE: EARLY STAGES OF RECOVERY BOOK: Freq: Once | Status: DC

## 2021-01-06 MED ORDER — CITALOPRAM HYDROBROMIDE 20 MG PO TABS
20.0000 mg | ORAL_TABLET | Freq: Every day | ORAL | Status: DC
  Administered 2021-01-06 – 2021-01-09 (×4): 20 mg via ORAL
  Filled 2021-01-06 (×2): qty 1
  Filled 2021-01-06: qty 2
  Filled 2021-01-06 (×2): qty 1

## 2021-01-06 MED ORDER — ACETAMINOPHEN 160 MG/5ML PO SOLN: 650.0000 mg | ORAL | Status: DC | PRN

## 2021-01-06 MED ORDER — SIMVASTATIN 20 MG PO TABS
40.0000 mg | ORAL_TABLET | Freq: Every day | ORAL | Status: DC
  Administered 2021-01-06 – 2021-01-09 (×4): 40 mg via ORAL
  Filled 2021-01-06 (×4): qty 2

## 2021-01-06 NOTE — ED Triage Notes (Signed)
Pt brought in via [facility] with c/c of weakness. Per EMS pt is from home where she lives with son. Pt was on toilet and couldn't get up. Pt is at baseline of able to get around. Per son pt got confused and speech become slurred.   150/80,  BS146 , 98%RA, A-Fib,

## 2021-01-06 NOTE — H&P (Addendum)
History and Physical    Randalyn Ahmed VOZ:[id number] DOB: [birth year] DOA: 01/06/2021  Referring MD/NP/PA: Lynden Oxford, MD PCP: Gaspar Garbe, MD  Patient coming from: Home(lives with son) via EMS  Chief Complaint: Right-sided weakness  I have personally briefly reviewed patient's old medical records in [facility]   HPI: Gaylia Kassel is a 87 y.o. 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  She recalls going to use the restroom this morning where her right arm kept falling off of the armrest.  She was able to place it back but it would fall off again.  Thereafter she noticed that she was unable to stand and has her right leg would not work either.  She called out for her son.  After EMS arrival patient son reported that she was unable to answer their questioning and it seemed as though she works confused.  Patient is normally able to ambulate with use of a walker with her prosthetic leg and is normally able to bathe and dress herself without assistance.  Notes associated symptoms of some neck pain, but reports that this is more so chronic in nature and related to arthritis.  Son reports that patient sleeps normally in a chair with her head down and that is likely the reason for her neck pain. Denies having any changes in vision, chest pain, palpitations, headache, or any recent falls since October of last year. Patient reports she didn't feel like she couldn't walk on her own at this time.  ED Course: Upon admission into the emergency department patient was seen to be afebrile with blood pressures elevated up to 184/51, and all other vital signs maintained.  Labs significant for platelets 146, BUN 29, creatinine 1.17, and TSH 1.672.  Chest x-ray showed stable cardiomegaly with out any acute abnormality.  Neurology recommended  taking MRI and MRA's of the head neck and completion of stroke work-up.  TRH called to admit  Review of Systems  Constitutional: Negative for chills, fever and malaise/fatigue.  HENT: Negative for congestion and nosebleeds.   Eyes: Negative for photophobia and pain.  Respiratory: Negative for cough and shortness of breath.   Cardiovascular: Negative for chest pain and leg swelling.  Gastrointestinal: Negative for abdominal pain, blood in stool, nausea and vomiting.  Genitourinary: Negative for dysuria and hematuria.  Musculoskeletal: Positive for neck pain. Negative for falls.  Skin: Negative for rash.  Neurological: Positive for speech change and focal weakness.  Psychiatric/Behavioral: Negative for substance abuse. The patient does not have insomnia.     Past Medical History:  Diagnosis Date  . Anemia    vitamin b 12 deficiency anemia  . 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 Chronic venous insufficiency   . Depression   . Difficulty in walking   . Displaced trimalleolar fracture of left lower leg   . Essential (primary) hypertension   . GERD (gastroesophageal reflux disease)   . History of blood transfusion    02/2018   . History of falling   . Hyperlipidemia   . Hyperlipidemia   . Hypertension   . 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 Iron deficiency   . Major depressive disorder   . Muscle weakness (generalized)   . Myocardial infarction (HCC)    nonstemi mi -  02/19/2018 at [facility] after ankle fracture   . Nutritional deficiency, unspecified   . Obesity   . Other lack of coordination   . Unspecified atrial fibrillation (HCC)   . Unspecified osteoarthritis, unspecified site     Past Surgical History:  Procedure Laterality Date  . AMPUTATION Left 04/10/2018   Procedure: LEFT BELOW KNEE AMPUTATION;  Surgeon: Nadara Mustard,  MD;  Location: [facility] OR;  Service: Orthopedics;  Laterality: Left;  . APPENDECTOMY    . APPLICATION OF WOUND VAC Left 03/31/2018   Procedure: APPLICATION OF WOUND VAC;  Surgeon: Sheral Apley, MD;  Location: [facility] ORS;  Service: Orthopedics;  Laterality: Left;  . HARDWARE REMOVAL Left 03/31/2018   Procedure: HARDWARE REMOVAL;  Surgeon: Sheral Apley, MD;  Location: [facility] ORS;  Service: Orthopedics;  Laterality: Left;  . IRRIGATION AND DEBRIDEMENT FOOT Left 03/31/2018   Procedure: IRRIGATION AND DEBRIDEMENT OF LEFT ANKLE;  Surgeon: Sheral Apley, MD;  Location: [facility] ORS;  Service: Orthopedics;  Laterality: Left;  . ORIF ANKLE FRACTURE Right 06/13/2014   Procedure: OPEN REDUCTION INTERNAL FIXATION (ORIF) BIMALLEOLAR ANKLE FRACTURE;  Surgeon: Loreta Ave, MD;  Location: [facility] OR;  Service: Orthopedics;  Laterality: Right;     reports that she has never smoked. She has never used smokeless tobacco. She reports that she does not drink alcohol and does not use drugs.  No Known Allergies  No significant family history reported  Prior to Admission medications   Medication Sig Start Date End Date Taking? Authorizing Provider  acetaminophen (TYLENOL) 325 MG tablet Take 325 mg by mouth every 6 (six) hours as needed for headache (pain).    [provider]  acetaminophen (TYLENOL) 500 MG tablet Take 500 mg by mouth at bedtime.     [provider]  amLODipine (NORVASC) 5 MG tablet Take 2 tablets (10 mg total) by mouth daily. 04/05/18   Roberto Scales D, MD  aspirin EC 81 MG EC tablet Take 1 tablet (81 mg total) by mouth daily. 05/04/18   Jodelle Red, MD  beta carotene w/minerals (OCUVITE) tablet Take 1 tablet by mouth daily.     [provider]  Calcium Carbonate-Vitamin D 600-400 MG-UNIT chew tablet Chew 1 tablet by mouth daily.     [provider]  citalopram (CELEXA) 20 MG tablet Take 20 mg by mouth daily.    [provider]  docusate sodium  (COLACE) 100 MG capsule Take 1 capsule (100 mg total) by mouth 2 (two) times daily. 04/14/18   Regalado, Belkys A, MD  esomeprazole (NEXIUM) 40 MG capsule Take 40 mg by mouth daily.     [provider]  ezetimibe-simvastatin (VYTORIN) 10-40 MG per tablet Take 1 tablet by mouth at bedtime.    [provider]  ferrous sulfate 325 (65 FE) MG tablet Take 325 mg by mouth 2 (two) times daily with a meal.    [provider]  gabapentin (NEURONTIN) 100 MG capsule Take 100 mg by mouth 3 (three) times daily.    [provider]  metoprolol tartrate (LOPRESSOR) 50 MG tablet Take 50 mg by mouth 2 (two) times daily.     [provider]  Multiple Vitamins-Minerals (DECUBI-VITE PO) Take 1 capsule by mouth daily.     [provider]  nitroGLYCERIN (NITROSTAT) 0.4 MG SL tablet Place 0.4 mg under the tongue every 5 (five) minutes as needed for chest pain.    [provider]  ondansetron [facility]) 4  MG tablet Take 1 tablet (4 mg total) by mouth every 8 (eight) hours as needed for nausea or vomiting. 03/31/18   Albina BilletMartensen, Henry Calvin III, PA-C  polyethylene glycol (MIRALAX / GLYCOLAX) packet Take 17 g by mouth 2 (two) times daily. 04/14/18   Regalado, Belkys A, MD  vitamin B-12 (CYANOCOBALAMIN) 1000 MCG tablet Take 1,000 mcg by mouth daily.    [provider]    Physical Exam:  Constitutional: Elderly female currently in no acute distress Vitals:   01/06/21 1315 01/06/21 1330 01/06/21 1345 01/06/21 1400  BP: (!) 147/61 (!) 143/61 (!) 152/59 (!) 165/116  Pulse: 62 67 63 67  Resp: 13     Temp:      TempSrc:      SpO2: 98% 100% 99% 97%  Weight:      Height:       Eyes: PERRL, lids and conjunctivae normal ENMT: Mucous membranes are moist. Posterior pharynx clear of any exudate or lesions.   Neck: normal, supple, no masses, no thyromegaly Respiratory: clear to auscultation bilaterally, no wheezing, no crackles. Normal respiratory effort. No  accessory muscle use.  Cardiovascular: Irregularly irregular. No extremity edema. 2+ pedal pulses. No carotid bruits.  Abdomen: no tenderness, no masses palpated. No hepatosplenomegaly. Bowel sounds positive.  Musculoskeletal: no clubbing / cyanosis.  Left leg BKA with prosthetic Skin: No lesions appreciated Neurologic: CN 2-12 grossly intact. Sensation intact, DTR normal. Strength 5/5 in all 4.  Psychiatric: Normal judgment and insight. Alert and oriented x 3. Normal mood.     Labs on Admission: I have personally reviewed following labs and imaging studies  CBC: Recent Labs  Lab 01/06/21 1310  WBC 7.8  NEUTROABS 5.9  HGB 12.6  HCT 39.8  MCV 95.7  PLT 146*   Basic Metabolic Panel: Recent Labs  Lab 01/06/21 1230  NA 135  K 4.2  CL 102  CO2 24  GLUCOSE 145*  BUN 29*  CREATININE 1.17*  CALCIUM 8.9  MG 2.0   GFR: Estimated Creatinine Clearance: 27.1 mL/min (A) (by C-G formula based on SCr of 1.17 mg/dL (H)). Liver Function Tests: Recent Labs  Lab 01/06/21 1230  AST 28  ALT 17  ALKPHOS 77  BILITOT 0.6  PROT 6.3*  ALBUMIN 3.5   No results for input(s): LIPASE, AMYLASE in the last 168 hours. No results for input(s): AMMONIA in the last 168 hours. Coagulation Profile: Recent Labs  Lab 01/06/21 1230  INR 1.0   Cardiac Enzymes: No results for input(s): CKTOTAL, CKMB, CKMBINDEX, TROPONINI in the last 168 hours. BNP (last 3 results) No results for input(s): PROBNP in the last 8760 hours. HbA1C: No results for input(s): HGBA1C in the last 72 hours. CBG: No results for input(s): GLUCAP in the last 168 hours. Lipid Profile: No results for input(s): CHOL, HDL, LDLCALC, TRIG, CHOLHDL, LDLDIRECT in the last 72 hours. Thyroid Function Tests: Recent Labs    01/06/21 1230  TSH 1.672   Anemia Panel: No results for input(s): VITAMINB12, FOLATE, FERRITIN, TIBC, IRON, RETICCTPCT in the last 72 hours. Urine analysis:    Component Value Date/Time   COLORURINE  YELLOW 06/13/2014 0018   APPEARANCEUR CLOUDY (A) 06/13/2014 0018   LABSPEC 1.018 06/13/2014 0018   PHURINE 5.0 06/13/2014 0018   GLUCOSEU NEGATIVE 06/13/2014 0018   HGBUR MODERATE (A) 06/13/2014 0018   BILIRUBINUR NEGATIVE 06/13/2014 0018   KETONESUR NEGATIVE 06/13/2014 0018   PROTEINUR NEGATIVE 06/13/2014 0018   UROBILINOGEN 0.2 06/13/2014 0018   NITRITE POSITIVE (A)  06/13/2014 0018   LEUKOCYTESUR LARGE (A) 06/13/2014 0018   Sepsis Labs: No results found for this or any previous visit (from the past 240 hour(s)).   Radiological Exams on Admission: DG Chest Portable 1 View  Result Date: 01/06/2021 CLINICAL DATA:  weakness EXAM: PORTABLE CHEST - 1 VIEW COMPARISON:  06/12/2014 FINDINGS: The mediastinal contours are within normal limits. Stable mild cardiomegaly. The lungs are clear bilaterally without evidence of focal consolidation, pleural effusion, or pneumothorax. Atherosclerotic calcifications of the thoracic aorta. No acute osseous abnormality. IMPRESSION: 1. No acute cardiopulmonary process. 2. Stable cardiomegaly. 3.  Aortic Atherosclerosis (ICD10-I70.0). Electronically Signed   By: Marliss Coots MD   On: 01/06/2021 12:47    EKG: Independently reviewed.  Atrial fibrillation at 63 bpm  Assessment/Plan CVA: Acute.  Patient presents with complaints of right-sided weakness with reports of confusion as patient noted not to be able to answer EMS questions.  At this time patient appears to be back to her baseline.   -Admit to telemetry bed -Stroke order set initiated -Neuro checks -Check  MRI/MRA head w/o contrast  and MRA neck w/ w/o contrast -Check echocardiogram -Check Hemoglobin A1c and lipid panel -PT/OT/Speech to eval and treat -Appreciate neurology consultative services, will follow-up   Paroxysmal atrial fibrillation: Patient currently in atrial fibrillation but appears rate controlled.  TSH within normal limits.  CHA2DS2-VASc score = 7(age, sex, HTN, TIA, MI).  Patient  reports last fall was in 09/2019.  Previously patient was not started on anticoagulation due to her being at high falls risk no prior history of stroke in the past.  -If no signs of bleed on MRI of the brain to possibly transition to oral anticoagulation   Essential hypertension: On admission blood pressures elevated up to 184/51 Home blood pressure medications include amlodipine 10 mg daily and metoprolol 50 mg twice daily. -Continue metoprolol for rate control -Held Amlodipine  Chronic kidney disease stage IIIb: On admission creatinine 1.17 which appears near patient's baseline. -Continue to monitor kidney function  s/p BKA: Patient with prior history of wound infection resulting in need of BKA.  Thrombocytopenia: Acute. Platlet count 146 on admission.  No reports of bleeding. -Continue to monitor  Depression -Continue Zoloft  Dyslipidemia -Follow-up lipid panel -Continue Vytorin  DNR: Present on admission  DVT prophylaxis: Plan to start on heparin drip if no signs of intracranial bleed Code Status: DNR Family Communication: Son updated over the phone Disposition Plan: Home Consults called: Neurology Admission status: Observation, requiring less than 24-hour hospitalization if work-up negative  Clydie Braun MD [facility]   If 7PM-7AM, please contact night-coverage   01/06/2021, 2:09 PM

## 2021-01-06 NOTE — ED Notes (Signed)
Attempted to call report to [phone]

## 2021-01-06 NOTE — Consult Note (Addendum)
Neurology Consultation  Reason for Consult: TIA Referring Physician: Arlyss Queen, MD  CC: right sided weakness  History is obtained from: Patient, son.   HPI: Ana Franco is a 87 y.o. female with a PMHx of HTN, GERD, PAF, depression, CAD with NSTEMI 7/19, OA, left transtibial amputation (due to wound infection) 8/19 with prosthesis who presented today to ED by EMS for right sided weakness. Per son, she was also a bit confused and seemed to be slurring her words. No personal history of stroke, brain trauma, brain bleed, or brain surgery.   She was found to be in AF when admitted. Patient states she does not have a history of a cardiac arrhythmia, but PAF is listed on her chart. AF is also mentioned on a cardiology out patient note from 9/19.   Event per patient and son: Patient awoke this am in her usual state of health. She ate breakfast and then went to the bathroom. When attempting to get off commode, she couldn't use her right arm to grip the raised toilet seat. Her right leg "wouldn't work". Her son was present prior to event. Patient's daughter n law called EMS due to concern of a stroke. Patient states that EMS helped her off of the commode and her right side was still weak. EMS mentioned a right facial droop, but patient states her mouth has looked like that.   Patient states both legs feel weak, but her right arm weakness has resolved by the time she reached the hospital. No HA or n/v. No vision changes. No slurred speech now.   In review of chart, patient saw cardiology in 9/19 and reluctance for anticoagulation due to patient's history of falls. No AC was started at that visit. Other than her amputation secondary to wound infection in 2019, she has little history of [facility] ED visits or stays.   Patient lives with son and daughter n Social worker. She is not responsible for any bills, but states she could do it. Walks with a walker. Need assistance with the hot water, but she sits on the toilet and  bathes herself. She needs no help with toileting or getting dressed. No longer drives.   Neurology was consulted for TIA and we have ordered a MRI brain and if negative for bleed will start Heparin with later consideration of conversion to po AC.   ROS: A robust ROS was performed and is negative except as noted in the HPI.   Past Medical History:  Diagnosis Date  . Anemia    vitamin b 12 deficiency anemia  . 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 Chronic venous insufficiency   . Depression   . Difficulty in walking   . Displaced trimalleolar fracture of left lower leg   . Essential (primary) hypertension   . GERD (gastroesophageal reflux disease)   . History of blood transfusion    02/2018   . History of falling   . Hyperlipidemia   . Hyperlipidemia   . Hypertension   . 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 Iron deficiency   . Major depressive disorder   . Muscle weakness (generalized)   . Myocardial infarction (HCC)    nonstemi mi - 02/19/2018 at [facility] after ankle fracture   . Nutritional deficiency, unspecified   . Obesity   . Other lack of coordination   . Unspecified atrial fibrillation (  HCC)   . Unspecified osteoarthritis, unspecified site     No family history on file.  Social History:   reports that she has never smoked. She has never used smokeless tobacco. She reports that she does not drink alcohol and does not use drugs.  Medications No current facility-administered medications for this encounter.  Current Outpatient Medications:  .  acetaminophen (TYLENOL) 325 MG tablet, Take 325 mg by mouth every 6 (six) hours as needed for headache (pain)., Disp: , Rfl:  .  amLODipine (NORVASC) 10 MG tablet, Take 1 tablet by mouth daily., Disp: , Rfl:  .  aspirin EC 81 MG EC tablet, Take 1 tablet (81 mg total) by mouth daily.,  Disp: , Rfl:  .  beta carotene w/minerals (OCUVITE) tablet, Take 1 tablet by mouth daily. , Disp: , Rfl:  .  Calcium Carbonate-Vitamin D 600-400 MG-UNIT chew tablet, Chew 1 tablet by mouth at bedtime., Disp: , Rfl:  .  cholecalciferol (VITAMIN D3) 25 MCG (1000 UNIT) tablet, Take 1,000 Units by mouth at bedtime., Disp: , Rfl:  .  citalopram (CELEXA) 20 MG tablet, Take 20 mg by mouth daily., Disp: , Rfl:  .  esomeprazole (NEXIUM) 40 MG capsule, Take 40 mg by mouth daily. , Disp: , Rfl:  .  ezetimibe-simvastatin (VYTORIN) 10-40 MG per tablet, Take 1 tablet by mouth at bedtime., Disp: , Rfl:  .  metoprolol tartrate (LOPRESSOR) 50 MG tablet, Take 50 mg by mouth 2 (two) times daily. , Disp: , Rfl:  .  nitroGLYCERIN (NITROSTAT) 0.4 MG SL tablet, Place 0.4 mg under the tongue every 5 (five) minutes as needed for chest pain., Disp: , Rfl:  .  polyethylene glycol (MIRALAX / GLYCOLAX) packet, Take 17 g by mouth 2 (two) times daily. (Patient taking differently: Take 17 g by mouth daily as needed for mild constipation.), Disp: 14 each, Rfl: 0 .  acetaminophen (TYLENOL) 500 MG tablet, Take 500 mg by mouth at bedtime. , Disp: , Rfl:  .  amLODipine (NORVASC) 5 MG tablet, Take 2 tablets (10 mg total) by mouth daily., Disp: , Rfl:  .  docusate sodium (COLACE) 100 MG capsule, Take 1 capsule (100 mg total) by mouth 2 (two) times daily., Disp: 10 capsule, Rfl: 0 .  ferrous sulfate 325 (65 FE) MG tablet, Take 325 mg by mouth daily with breakfast., Disp: , Rfl:  .  gabapentin (NEURONTIN) 100 MG capsule, Take 100 mg by mouth 3 (three) times daily., Disp: , Rfl:  .  ondansetron (ZOFRAN) 4 MG tablet, Take 1 tablet (4 mg total) by mouth every 8 (eight) hours as needed for nausea or vomiting. (Patient not taking: Reported on 01/06/2021), Disp: 20 tablet, Rfl: 0   Exam: Current vital signs: BP (!) 165/116   Pulse 67   Temp 97.7 F (36.5 C) (Oral)   Resp 13   Ht 5\' 6"  (1.676 m)   Wt 56 kg   SpO2 97%   BMI 19.93 kg/m   Vital signs in last 24 hours: Temp:  [97.7 F (36.5 C)] 97.7 F (36.5 C) (05/28 1209) Pulse Rate:  [61-67] 67 (05/28 1400) Resp:  [10-17] 13 (05/28 1315) BP: (140-165)/(57-116) 165/116 (05/28 1400) SpO2:  [97 %-100 %] 97 % (05/28 1400) Weight:  [56 kg] 56 kg (05/28 1209)  PE: GENERAL: Very well appearing elderly female lying comfortably in bed with son at bedside.  Awake, alert in NAD HEENT: - Normocephalic and atraumatic LUNGS - Normal respiratory effort.  CV -Afib with  rate in 60s per tele.  Ext: warm, well perfused Psych: affect light. Does not appear anxious. Cooperative.   NEURO:  Mental Status: AA&Ox3  Speech/Language: speech is without dysarthria or aphasia. Naming, repetition, fluency, and comprehension intact.  Cranial Nerves:  II: PERRL Visual fields full.  III, IV, VI: EOMI. Lid elevation symmetric and full.  V: sensation is intact and symmetrical to face.  VII: Face with questionable very slight right facial droop.  VIII:hearing intact to voice IX, X: palate elevation is symmetric. Phonation normal.  XI: normal sternocleidomastoid and trapezius muscle strength IBB:CWUGQB is symmetrical without fasciculations.   Motor: RUE: grips  5/5    triceps 5/5    biceps 5 /5            LUE: grips  5/5     triceps  5/5    biceps  5 /5       RLE:  knee 5/5  thigh 5/5   plantar flexion  5/5  dorsiflexion   /5        LLE:  thigh  5/5 Exam limited to due LLE prosthesis.  Tone is normal. Bulk is normal.  Sensation- Intact to light touch bilaterally in all four extremities. Extinction absent to light touch to DSS.  Coordination: FTN intact bilaterally. HKS intact bilaterally. No pronator drift.  DTRs: RUE:  Brachioradialis 1+                  RLE:  Patella 1+             LUE:  Brachioradialis  1+                LLE: unable to perform due to prosthesis.  Gait- deferred  NIHSS:  1a Level of Consciousness: 0  1b LOC Questions: 0 1c LOC Commands: 0 2 Best Gaze: 0 3 Visual:  0 4 Facial Palsy: 1 5a Motor Arm - left: 0 5b Motor Arm - Right: 0 6a Motor Leg - Left: 0 6b Motor Leg - Right: 0 7 Limb Ataxia: 0 8 Sensory: 0 9 Best Language: 0 10 Dysarthria: 0 11 Extinction and Inattention: 0 TOTAL: 1  Labs WBCC normal   INR 1.0    PT 12.9    TSH 1.672  CBC    Component Value Date/Time   WBC 7.8 01/06/2021 1310   RBC 4.16 01/06/2021 1310   HGB 12.6 01/06/2021 1310   HCT 39.8 01/06/2021 1310   PLT 146 (L) 01/06/2021 1310   MCV 95.7 01/06/2021 1310   MCH 30.3 01/06/2021 1310   MCHC 31.7 01/06/2021 1310   RDW 13.2 01/06/2021 1310   LYMPHSABS 1.0 01/06/2021 1310   MONOABS 0.8 01/06/2021 1310   EOSABS 0.1 01/06/2021 1310   BASOSABS 0.0 01/06/2021 1310    CMP     Component Value Date/Time   NA 135 01/06/2021 1230   NA 137 06/23/2014 0000   K 4.2 01/06/2021 1230   CL 102 01/06/2021 1230   CO2 24 01/06/2021 1230   GLUCOSE 145 (H) 01/06/2021 1230   BUN 29 (H) 01/06/2021 1230   BUN 24 (A) 06/23/2014 0000   CREATININE 1.17 (H) 01/06/2021 1230   CALCIUM 8.9 01/06/2021 1230   PROT 6.3 (L) 01/06/2021 1230   ALBUMIN 3.5 01/06/2021 1230   AST 28 01/06/2021 1230   ALT 17 01/06/2021 1230   ALKPHOS 77 01/06/2021 1230   BILITOT 0.6 01/06/2021 1230   GFRNONAA 44 (L) 01/06/2021 1230   GFRAA 38 (L)  04/14/2018 1230   Imaging  MRI brain/MRA head and neck are pending.   Assessment: 87 yo female who [facility] today for right sided weakness. Her weakness resolved. She was found to be in AF on arrival, so this is likely TIA with transient weakness due to AF, as AF can be associated with embolic strokes. Her stroke risk factors include HLD, HTN, and CAD. Her MRI brain and MRA head and neck are pending.   Impression: -TIA with transient right sided weakness most likely secondary to AF.   Recommendations/Plan:  -Medicine admit for TIA workup.  -MRI brain to r/o bleed. -If MRI brain negative for bleed, it is OK to start Heparin for AF.   -Decision to transition to [facility] should be discussed due to history of falls.  -MRA head and neck to check for LVO.  -If MRI negative for bleed, permissive HTN is warranted up to  210/105.  -BP normotensive now until MRI is cleared for bleed.  - HgbA1c, fasting lipid panel. - High intensity statin or increase dose if LDL>70. Do not start statin until MRI brain cleared of bleed.  - Frequent neuro checks. - follow NIHSS per TIA protocol.  - Risk factor modification. - cardiac telemetry monitoring for arrhythmia - PT consult, OT consult, Speech consult. - stroke education. - Stroke team to follow.  Pt seen by Jimmye NormanKaren Kirby-Graham, NP/Neuro and later by MD. Note/plan to be edited by MD as needed.  Pager: [phone]  I have seen the patient reviewed the above note.   87 year old female with transient unilateral weakness in the setting of new onset atrial flutter.  This is strongly suggestive of TIA/stroke.  I would favor getting an MRI, and if negative for stroke or bleed, we can go ahead and start anticoagulation.  Other work-up as above.  Ritta SlotMcNeill Eveleen Mcnear, MD [facility] [phone]  If 7pm- 7am, please page neurology on call as listed in AMION.

## 2021-01-06 NOTE — ED Notes (Signed)
5N RN called at this time stating that they cannot accept patient prior to MRI results. Hospitalist made aware, states that patient can go to MRI w/o telemetry monitoring. ED charge made aware of bed rejection.

## 2021-01-06 NOTE — Progress Notes (Signed)
Discuss with Dr. Viviann Spare of neurology overnight about MRI being positive for stroke. Recommend start aspirin and possibly start oral anticoagulation in near future and continue Metoprolol for rate control.

## 2021-01-06 NOTE — ED Provider Notes (Signed)
[facility]   CSN: [id number] Arrival date & time: 01/06/21  1144     History Chief Complaint  Patient presents with  . Weakness    Pt brought in via [facility] with c/c of weakness. Per EMS pt is from home where she lives with son. Pt was on toilet and couldn't get up. Pt is at baseline of able to get around. Per son pt got confused and speech become slurred.   150/80,  BS146 , 98%RA, A-Fib,      Ana Franco is a 87 y.o. female.  The history is provided by medical records. No language interpreter was used.  Neurologic Problem This is a new problem. The current episode started 1 to 2 hours ago. The problem occurs rarely. The problem has been resolved. Pertinent negatives include no chest pain, no abdominal pain, no headaches and no shortness of breath. Nothing aggravates the symptoms. Nothing relieves the symptoms. She has tried nothing for the symptoms. The treatment provided no relief.       Past Medical History:  Diagnosis Date  . Anemia    vitamin b 12 deficiency anemia  . 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 Chronic venous insufficiency   . Depression   . Difficulty in walking   . Displaced trimalleolar fracture of left lower leg   . Essential (primary) hypertension   . GERD (gastroesophageal reflux disease)   . History of blood transfusion    02/2018   . History of falling   . Hyperlipidemia   . Hyperlipidemia   . Hypertension   . 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 Iron deficiency   . Major depressive disorder   . Muscle weakness (generalized)   . Myocardial infarction (HCC)    [facility]   . Nutritional deficiency, unspecified   . Obesity   . Other lack of coordination   . Unspecified atrial fibrillation (HCC)    . Unspecified osteoarthritis, unspecified site     Patient Active Problem List   Diagnosis Date Noted  . Paroxysmal atrial fibrillation (HCC) 05/04/2018  . Gangrene of lower extremity (HCC)   . Subacute osteomyelitis, left ankle and foot (HCC)   . S/P ORIF (open reduction internal fixation) fracture 04/03/2018  . Wound infection 04/02/2018  . MRSA (methicillin resistant staph aureus) culture positive 04/02/2018  . Acute blood loss anemia 04/02/2018  . Closed left ankle fracture, with delayed healing, subsequent encounter 03/31/2018  . Essential hypertension, benign 06/23/2014  . Dyslipidemia 06/23/2014  . GERD (gastroesophageal reflux disease) 06/23/2014  . UTI (urinary tract infection) 06/23/2014  . Depression 06/23/2014    Past Surgical History:  Procedure Laterality Date  . AMPUTATION Left 04/10/2018   Procedure: LEFT BELOW KNEE AMPUTATION;  Surgeon: Nadara Mustarduda, Marcus V, MD;  [facility];  Service: Orthopedics;  Laterality: Left;  . APPENDECTOMY    . APPLICATION OF WOUND VAC Left 03/31/2018   Procedure: APPLICATION OF WOUND VAC;  Surgeon: Sheral ApleyMurphy, Timothy D, MD;  Location: [facility] ORS;  Service: Orthopedics;  Laterality: Left;  . HARDWARE REMOVAL Left 03/31/2018   Procedure: HARDWARE REMOVAL;  Surgeon: Sheral ApleyMurphy, Timothy D, MD;  Location: [facility] ORS;  Service: Orthopedics;  Laterality: Left;  . IRRIGATION AND DEBRIDEMENT FOOT Left 03/31/2018   Procedure: IRRIGATION AND DEBRIDEMENT OF LEFT ANKLE;  Surgeon:  Sheral Apley, MD;  Location: [facility] ORS;  Service: Orthopedics;  Laterality: Left;  . ORIF ANKLE FRACTURE Right 06/13/2014   Procedure: OPEN REDUCTION INTERNAL FIXATION (ORIF) BIMALLEOLAR ANKLE FRACTURE;  Surgeon: Loreta Ave, MD;  Location: [facility] OR;  Service: Orthopedics;  Laterality: Right;     OB History   No obstetric history on file.     No family history on file.  Social History   Tobacco Use  . Smoking status: Never Smoker  . Smokeless tobacco: Never Used  Vaping Use  .  Vaping Use: Never used  Substance Use Topics  . Alcohol use: No  . Drug use: No    Home Medications Prior to Admission medications   Medication Sig Start Date End Date Taking? Authorizing Provider  acetaminophen (TYLENOL) 325 MG tablet Take 325 mg by mouth every 6 (six) hours as needed for headache (pain).    [provider]  acetaminophen (TYLENOL) 500 MG tablet Take 500 mg by mouth at bedtime.     [provider]  amLODipine (NORVASC) 5 MG tablet Take 2 tablets (10 mg total) by mouth daily. 04/05/18   Roberto Scales D, MD  aspirin EC 81 MG EC tablet Take 1 tablet (81 mg total) by mouth daily. 05/04/18   Jodelle Red, MD  beta carotene w/minerals (OCUVITE) tablet Take 1 tablet by mouth daily.     [provider]  Calcium Carbonate-Vitamin D 600-400 MG-UNIT chew tablet Chew 1 tablet by mouth daily.     [provider]  citalopram (CELEXA) 20 MG tablet Take 20 mg by mouth daily.    [provider]  docusate sodium (COLACE) 100 MG capsule Take 1 capsule (100 mg total) by mouth 2 (two) times daily. 04/14/18   Regalado, Belkys A, MD  esomeprazole (NEXIUM) 40 MG capsule Take 40 mg by mouth daily.     [provider]  ezetimibe-simvastatin (VYTORIN) 10-40 MG per tablet Take 1 tablet by mouth at bedtime.    [provider]  ferrous sulfate 325 (65 FE) MG tablet Take 325 mg by mouth 2 (two) times daily with a meal.    [provider]  gabapentin (NEURONTIN) 100 MG capsule Take 100 mg by mouth 3 (three) times daily.    [provider]  metoprolol tartrate (LOPRESSOR) 50 MG tablet Take 50 mg by mouth 2 (two) times daily.     [provider]  Multiple Vitamins-Minerals (DECUBI-VITE PO) Take 1 capsule by mouth daily.     [provider]  nitroGLYCERIN (NITROSTAT) 0.4 MG SL tablet Place 0.4 mg under the tongue every 5 (five) minutes as needed for chest pain.    [provider]  ondansetron  (ZOFRAN) 4 MG tablet Take 1 tablet (4 mg total) by mouth every 8 (eight) hours as needed for nausea or vomiting. 03/31/18   Albina Billet III, PA-C  polyethylene glycol (MIRALAX / GLYCOLAX) packet Take 17 g by mouth 2 (two) times daily. 04/14/18   Regalado, Belkys A, MD  vitamin B-12 (CYANOCOBALAMIN) 1000 MCG tablet Take 1,000 mcg by mouth daily.    [provider]    Allergies    Patient has no known allergies.  Review of Systems   Review of Systems  Constitutional: Negative for chills and diaphoresis.  HENT: Negative for congestion.   Eyes: Negative for visual disturbance.  Respiratory: Negative for cough, chest tightness and shortness of breath.   Cardiovascular: Negative for chest pain.  Gastrointestinal: Negative for abdominal  pain, diarrhea, nausea and vomiting.  Genitourinary: Negative for dysuria and flank pain.  Musculoskeletal: Negative for back pain.  Neurological: Positive for speech difficulty. Negative for seizures, facial asymmetry, light-headedness, numbness and headaches.  Psychiatric/Behavioral: Negative for agitation.  All other systems reviewed and are negative.   Physical Exam Updated Vital Signs BP (!) 151/78 (BP Location: Right Arm)   Pulse 67   Temp 97.7 F (36.5 C) (Oral)   Resp 17   Ht 5\' 6"  (1.676 m)   Wt 56 kg   SpO2 99%   BMI 19.93 kg/m   Physical Exam Vitals and nursing note reviewed.  Constitutional:      General: She is not in acute distress.    Appearance: She is well-developed. She is not ill-appearing, toxic-appearing or diaphoretic.  HENT:     Head: Normocephalic and atraumatic.     Right Ear: External ear normal.     Left Ear: External ear normal.     Nose: Nose normal.     Mouth/Throat:     Pharynx: No oropharyngeal exudate.  Eyes:     Conjunctiva/sclera: Conjunctivae normal.     Pupils: Pupils are equal, round, and reactive to light.  Cardiovascular:     Rate and Rhythm: Tachycardia present. Rhythm irregular.      Pulses: Normal pulses.     Heart sounds: No murmur heard.   Pulmonary:     Effort: No respiratory distress.     Breath sounds: No stridor. No wheezing, rhonchi or rales.  Chest:     Chest wall: No tenderness.  Abdominal:     General: There is no distension.     Tenderness: There is no abdominal tenderness. There is no rebound.  Musculoskeletal:        General: No tenderness.     Cervical back: Normal range of motion and neck supple.  Skin:    General: Skin is warm.     Findings: No erythema or rash.  Neurological:     Mental Status: She is alert and oriented to person, place, and time.     Sensory: No sensory deficit.     Motor: No weakness or abnormal muscle tone.     Deep Tendon Reflexes: Reflexes are normal and symmetric.  Psychiatric:        Mood and Affect: Mood normal.     ED Results / Procedures / Treatments   Labs (all labs ordered are listed, but only abnormal results are displayed) Labs Reviewed  COMPREHENSIVE METABOLIC PANEL - Abnormal; Notable for the following components:      Result Value   Glucose, Bld 145 (*)    BUN 29 (*)    Creatinine, Ser 1.17 (*)    Total Protein 6.3 (*)    GFR, Estimated 44 (*)    All other components within normal limits  CBC WITH DIFFERENTIAL/PLATELET - Abnormal; Notable for the following components:   Platelets 146 (*)    All other components within normal limits  RESP PANEL BY RT-PCR (FLU A&B, COVID) [id number]  URINE CULTURE  TSH  PROTIME-INR  MAGNESIUM  CBC WITH DIFFERENTIAL/PLATELET  URINALYSIS, ROUTINE W REFLEX MICROSCOPIC  HEMOGLOBIN A1C    EKG EKG Interpretation  Date/Time:  Saturday Jan 06 2021 13:12:38 EDT Ventricular Rate:  63 PR Interval:    QRS Duration: 91 QT Interval:  409 QTC Calculation: 402 R Axis:   -37 Text Interpretation: Atrial fibrillation Ventricular premature complex Inferior infarct, old Anterior infarct, old when compared toprior, now a flutter.  No STEMI Confirmed by Theda Belfast  [phone]) on 01/06/2021 1:56:49 PM   Radiology DG Chest Portable 1 View  Result Date: 01/06/2021 CLINICAL DATA:  weakness EXAM: PORTABLE CHEST - 1 VIEW COMPARISON:  06/12/2014 FINDINGS: The mediastinal contours are within normal limits. Stable mild cardiomegaly. The lungs are clear bilaterally without evidence of focal consolidation, pleural effusion, or pneumothorax. Atherosclerotic calcifications of the thoracic aorta. No acute osseous abnormality. IMPRESSION: 1. No acute cardiopulmonary process. 2. Stable cardiomegaly. 3.  Aortic Atherosclerosis (ICD10-I70.0). Electronically Signed   By: Marliss Coots MD   On: 01/06/2021 12:47    Procedures Procedures   Medications Ordered in ED Medications  ezetimibe-simvastatin (VYTORIN) 10-40 MG per tablet 1 tablet (has no administration in time range)  citalopram (CELEXA) tablet 20 mg (20 mg Oral Given 01/06/21 1554)   stroke: mapping our early stages of recovery book (has no administration in time range)  acetaminophen (TYLENOL) tablet 650 mg (has no administration in time range)    Or  acetaminophen (TYLENOL) 160 MG/5ML solution 650 mg (has no administration in time range)    Or  acetaminophen (TYLENOL) suppository 650 mg (has no administration in time range)  senna-docusate (Senokot-S) tablet 1 tablet (has no administration in time range)  hydrALAZINE (APRESOLINE) injection 5 mg (has no administration in time range)    ED Course  I have reviewed the triage vital signs and the nursing notes.  Pertinent labs & imaging results that were available during my care of the patient were reviewed by me and considered in my medical decision making (see chart for details).    MDM Rules/Calculators/A&P                          Ana Franco is a 87 y.o. female with a past medical history significant for hypertension, dyslipidemia, documentation supporting paroxysmal atrial fibrillation although patient denies, and left lower extremity prosthesis due to  previous leg infection who presents for transient right arm and right leg weakness and aphasia.  Patient reports that this morning around 10 AM, she was sitting on the toilet and had no symptoms.  She reports when she tried to get up, her right arm and right leg were weak and would not let her get up.  She called for help and her speech did not seem right.  She says that she is now back to baseline and denies any weakness or speech difficulties.  She denies any headache or neck pain.  She denies any fevers, chills, chest pain, shortness of breath, nausea, vomiting, constipation, or diarrhea.  Denies any recent other changes.  Denies any trauma.  On exam, lungs clear and chest nontender.  Abdomen nontender.  Patient has normal sensation and strength in extremities and she does have a left lower extremity prosthesis.  Normal finger-nose-finger testing bilaterally and pupils are symmetric and reactive normal extraocular movements.  Clear speech for me.  EKG shows atrial fibrillation with variable rate.  Although patient fervently denies history of A. fib, it does appear that she has  paroxysmal atrial fibrillation in the chart and is not on anticoagulation.  No STEMI seen.  Spoke with neurology who agrees this does seem very much like TIA likely related to her A. fib at this time.  As patient has no palpitations or chest pain or shortness of breath, unknown when her A. fib episode began.  Patient will be admitted for further management however neurology recommended MRI brain and MRA head  and neck.  Neurology will see and she will be admitted for further management.  Patient will be admitted for further work-up of TIA.   Final Clinical Impression(s) / ED Diagnoses Final diagnoses:  TIA (transient ischemic attack)  Transient speech disturbance  Transient weakness of right lower extremity  Atrial flutter, unspecified type (HCC)    Clinical Impression: 1. TIA (transient ischemic attack)   2.  Transient speech disturbance   3. Transient weakness of right lower extremity   4. Atrial flutter, unspecified type (HCC)     Disposition: Admit  This note was prepared with assistance of Dragon voice recognition software. Occasional wrong-word or sound-a-like substitutions may have occurred due to the inherent limitations of voice recognition software.     Morry Veiga, Canary Brim, MD 01/06/21 [phone]

## 2021-01-06 NOTE — ED Notes (Signed)
Patient on call light, requesting bed pan at this time. Placed on bedpan and advised to use call light when done. Patient gown cut off and new gown placed as it was dirtied.

## 2021-01-07 ENCOUNTER — Observation Stay (HOSPITAL_COMMUNITY): Payer: Medicare Other

## 2021-01-07 ENCOUNTER — Encounter (HOSPITAL_COMMUNITY): Payer: Self-pay | Admitting: Internal Medicine

## 2021-01-07 DIAGNOSIS — Z9181 History of falling: Secondary | ICD-10-CM | POA: Diagnosis not present

## 2021-01-07 DIAGNOSIS — D519 Vitamin B12 deficiency anemia, unspecified: Secondary | ICD-10-CM | POA: Diagnosis present

## 2021-01-07 DIAGNOSIS — I63422 Cerebral infarction due to embolism of left anterior cerebral artery: Secondary | ICD-10-CM | POA: Diagnosis present

## 2021-01-07 DIAGNOSIS — Z89512 Acquired absence of left leg below knee: Secondary | ICD-10-CM | POA: Diagnosis not present

## 2021-01-07 DIAGNOSIS — I6389 Other cerebral infarction: Secondary | ICD-10-CM

## 2021-01-07 DIAGNOSIS — K219 Gastro-esophageal reflux disease without esophagitis: Secondary | ICD-10-CM | POA: Diagnosis present

## 2021-01-07 DIAGNOSIS — I63432 Cerebral infarction due to embolism of left posterior cerebral artery: Secondary | ICD-10-CM | POA: Diagnosis present

## 2021-01-07 DIAGNOSIS — I872 Venous insufficiency (chronic) (peripheral): Secondary | ICD-10-CM | POA: Diagnosis present

## 2021-01-07 DIAGNOSIS — R29810 Facial weakness: Secondary | ICD-10-CM | POA: Diagnosis present

## 2021-01-07 DIAGNOSIS — I639 Cerebral infarction, unspecified: Secondary | ICD-10-CM | POA: Diagnosis present

## 2021-01-07 DIAGNOSIS — I671 Cerebral aneurysm, nonruptured: Secondary | ICD-10-CM | POA: Diagnosis present

## 2021-01-07 DIAGNOSIS — I252 Old myocardial infarction: Secondary | ICD-10-CM | POA: Diagnosis not present

## 2021-01-07 DIAGNOSIS — F32A Depression, unspecified: Secondary | ICD-10-CM | POA: Diagnosis present

## 2021-01-07 DIAGNOSIS — E785 Hyperlipidemia, unspecified: Secondary | ICD-10-CM | POA: Diagnosis present

## 2021-01-07 DIAGNOSIS — N1832 Chronic kidney disease, stage 3b: Secondary | ICD-10-CM | POA: Diagnosis present

## 2021-01-07 DIAGNOSIS — Z20822 Contact with and (suspected) exposure to covid-19: Secondary | ICD-10-CM | POA: Diagnosis present

## 2021-01-07 DIAGNOSIS — R29700 NIHSS score 0: Secondary | ICD-10-CM | POA: Diagnosis present

## 2021-01-07 DIAGNOSIS — M1990 Unspecified osteoarthritis, unspecified site: Secondary | ICD-10-CM | POA: Diagnosis present

## 2021-01-07 DIAGNOSIS — D696 Thrombocytopenia, unspecified: Secondary | ICD-10-CM | POA: Diagnosis present

## 2021-01-07 DIAGNOSIS — I129 Hypertensive chronic kidney disease with stage 1 through stage 4 chronic kidney disease, or unspecified chronic kidney disease: Secondary | ICD-10-CM | POA: Diagnosis present

## 2021-01-07 DIAGNOSIS — R29701 NIHSS score 1: Secondary | ICD-10-CM | POA: Diagnosis not present

## 2021-01-07 DIAGNOSIS — G8191 Hemiplegia, unspecified affecting right dominant side: Secondary | ICD-10-CM | POA: Diagnosis present

## 2021-01-07 DIAGNOSIS — Z66 Do not resuscitate: Secondary | ICD-10-CM | POA: Diagnosis present

## 2021-01-07 DIAGNOSIS — I2510 Atherosclerotic heart disease of native coronary artery without angina pectoris: Secondary | ICD-10-CM | POA: Diagnosis present

## 2021-01-07 DIAGNOSIS — I4892 Unspecified atrial flutter: Secondary | ICD-10-CM | POA: Diagnosis present

## 2021-01-07 DIAGNOSIS — I480 Paroxysmal atrial fibrillation: Secondary | ICD-10-CM | POA: Diagnosis present

## 2021-01-07 LAB — LIPID PANEL
Cholesterol: 95 mg/dL (ref 0–200)
HDL: 41 mg/dL (ref >40)
LDL Cholesterol: 44 mg/dL (ref 0–99)
Total CHOL/HDL Ratio: 2.3 RATIO
Triglycerides: 52 mg/dL (ref <150)
VLDL: 10 mg/dL (ref 0–40)

## 2021-01-07 LAB — ECHOCARDIOGRAM COMPLETE
AR max vel: 1.27 cm2
AV Area VTI: 1.24 cm2
AV Area mean vel: 1.26 cm2
AV Mean grad: 4 mmHg
AV Peak grad: 7 mmHg
Ao pk vel: 1.32 m/s
Area-P 1/2: 3.91 cm2
Height: 66 in
MV VTI: 0.89 cm2
P 1/2 time: 561 msec
S' Lateral: 2.4 cm
Weight: 2733.7 oz

## 2021-01-07 MED ORDER — APIXABAN 5 MG PO TABS
5.0000 mg | ORAL_TABLET | Freq: Two times a day (BID) | ORAL | Status: DC
  Administered 2021-01-07 – 2021-01-09 (×5): 5 mg via ORAL
  Filled 2021-01-07 (×5): qty 1

## 2021-01-07 MED ORDER — APIXABAN 5 MG PO TABS: 5.0000 mg | ORAL_TABLET | Freq: Two times a day (BID) | ORAL | Status: DC

## 2021-01-07 MED ORDER — APIXABAN 2.5 MG PO TABS: 2.5000 mg | ORAL_TABLET | Freq: Two times a day (BID) | ORAL | Status: DC

## 2021-01-07 NOTE — Progress Notes (Signed)
PROGRESS NOTE  Ana Franco  UUV:[id number] DOB: 1925/09/02 DOA: 01/06/2021 PCP: Gaspar Garbe, MD   Brief Narrative: Ana Franco is a 87 y.o. female with a history of PAF not on anticoagulation due to falls, CAD, HLD, LLE infection s/p left transtibial amputation, and depression who presented to the ED by EMS when she noticed right arm weakness and son reported speech difficulty and confusion. Vital signs were stable, labs significant for platelet count of 146k, SCr 1.17. Neurology was consulted and MRI did confirm acute posterior left frontal ischemic CVA in distribution consistent with embolic stroke due to atrial fibrillation. Aspirin has been converted to eliquis after extensive discussions of risks and benefits of anticoagulation. Further work up is underway, and stroke neurology is following. CIR admission for rehabilitation is also being considered.   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  - Echo pending.  - Discussions of risks and benefits continued in detail regarding anticoagulation. The risk of recurrent stroke seems to be higher than the risk of bleeding, with specific concern for traumatic ICH with hx falls. We will start eliquis tonight.   PAF:  - CHA2DS2-VASc score is at least 7 inclusive of this embolic stroke. We will start anticoagulation, stop aspirin.  - Continue metoprolol for rate control  Stage IIIb CKD:  - Avoid nephrotoxins. Risk of DOAC felt to be lower than risks associated with coumadin.   HTN:  - Permissive HTN, holding norvasc.   Thrombocytopenia: Did have self-limited  nose bleed several weeks ago when asked, no other bleeding noted, none currently.  - Monitor CBC in AM with initiation of anticoagulation. Aspirin stopped.   HLD: LDL is 44 - Continue vytorin with no hemorrhage on neuroimaging.   s/p transtibial amputation: For infection.  - Continue PT  Depression:  - Continue SSRI   DVT prophylaxis: Eliquis Code Status: DNR, POA Family Communication: None at bedside Disposition Plan:  Status is: Inpatient  Remains inpatient appropriate because:Ongoing diagnostic testing needed not appropriate for outpatient work up  Dispo: The patient is from: Home              Anticipated d/c is to: CIR              Patient currently is not medically stable to d/c.   Difficult to place patient No  Consultants:   Neurology  Procedures:   Echocardiogram  Antimicrobials:  None   Subjective: Right arm/hand back to nearly normal function. Denies any new weakness, numbness, speech difficulties.   Objective: Vitals:   01/06/21 2100 01/06/21 2200 01/06/21 2300 01/07/21 0332  BP: (!) 151/82 (!) 143/54 (!) 166/63 (!) 148/62  Pulse: 66 64 61 63  Resp: (!) 21 17 16 18   Temp:   97.9 F (36.6 C) 97.9 F (36.6 C)  TempSrc:   Oral Oral  SpO2: 97% 94% 94% 92%  Weight:  77.5 kg    Height:  5\' 6"  (1.676 m)      Intake/Output Summary (Last 24 hours) at 01/07/2021 1410 Last data filed at 01/07/2021 0900 Gross per 24 hour  Intake 250 ml  Output 650 ml  Net -400 ml  Filed Weights   01/06/21 1209 01/06/21 2200  Weight: 56 kg 77.5 kg    Gen: 87 y.o. female in no distress  Pulm: Non-labored breathing room air. Clear to auscultation bilaterally.  CV: Regular rate and rhythm. Soft systolic murmur at base, no other murmur, rub, or gallop. No JVD, no pitting pedal edema. GI: Abdomen soft, non-tender, non-distended, with normoactive bowel sounds. No organomegaly or masses felt. Ext: Warm, no deformities Skin: No rashes, lesions or ulcers on visualized  skin. Neuro: Alert and oriented. No focal neurological deficits. Psych: Judgement and insight appear normal. Mood & affect appropriate.   Data Reviewed: I have personally reviewed following labs and imaging studies  CBC: Recent Labs  Lab 01/06/21 1310  WBC 7.8  NEUTROABS 5.9  HGB 12.6  HCT 39.8  MCV 95.7  PLT 146*   Basic Metabolic Panel: Recent Labs  Lab 01/06/21 1230  NA 135  K 4.2  CL 102  CO2 24  GLUCOSE 145*  BUN 29*  CREATININE 1.17*  CALCIUM 8.9  MG 2.0   GFR: Estimated Creatinine Clearance: 32.3 mL/Ana (A) (by C-G formula based on SCr of 1.17 mg/dL (H)). Liver Function Tests: Recent Labs  Lab 01/06/21 1230  AST 28  ALT 17  ALKPHOS 77  BILITOT 0.6  PROT 6.3*  ALBUMIN 3.5   No results for input(s): LIPASE, AMYLASE in the last 168 hours. No results for input(s): AMMONIA in the last 168 hours. Coagulation Profile: Recent Labs  Lab 01/06/21 1230  INR 1.0   Cardiac Enzymes: No results for input(s): CKTOTAL, CKMB, CKMBINDEX, TROPONINI in the last 168 hours. BNP (last 3 results) No results for input(s): PROBNP in the last 8760 hours. HbA1C: No results for input(s): HGBA1C in the last 72 hours. CBG: No results for input(s): GLUCAP in the last 168 hours. Lipid Profile: Recent Labs    01/07/21 0126  CHOL 95  HDL 41  LDLCALC 44  TRIG 52  CHOLHDL 2.3   Thyroid Function Tests: Recent Labs    01/06/21 1230  TSH 1.672   Anemia Panel: No results for input(s): VITAMINB12, FOLATE, FERRITIN, TIBC, IRON, RETICCTPCT in the last 72 hours. Urine analysis:    Component Value Date/Time   COLORURINE YELLOW 06/13/2014 0018   APPEARANCEUR CLOUDY (A) 06/13/2014 0018   LABSPEC 1.018 06/13/2014 0018   PHURINE 5.0 06/13/2014 0018   GLUCOSEU NEGATIVE 06/13/2014 0018   HGBUR MODERATE (A) 06/13/2014 0018   BILIRUBINUR NEGATIVE 06/13/2014 0018   KETONESUR NEGATIVE 06/13/2014 0018   PROTEINUR NEGATIVE 06/13/2014 0018   UROBILINOGEN 0.2 06/13/2014 0018    NITRITE POSITIVE (A) 06/13/2014 0018   LEUKOCYTESUR LARGE (A) 06/13/2014 0018   Recent Results (from the past 240 hour(s))  Resp Panel by RT-PCR (Flu A&B, Covid) Nasopharyngeal Swab     Status: None   Collection Time: 01/06/21 12:32 PM   Specimen: Nasopharyngeal Swab; Nasopharyngeal(NP) swabs in vial transport medium  Result Value Ref Range Status   SARS Coronavirus 2 by RT PCR NEGATIVE NEGATIVE Final    Comment: (NOTE) SARS-CoV-2 target nucleic acids are NOT DETECTED.  The SARS-CoV-2 RNA is generally detectable in upper respiratory specimens during the acute phase of infection. The lowest concentration of SARS-CoV-2 viral copies this assay can detect is 138 copies/mL. A negative result does not preclude SARS-Cov-2 infection and should not be used as the sole basis for treatment or other patient management decisions. A negative result may occur with  improper specimen collection/handling, submission of specimen other than nasopharyngeal swab,  presence of viral mutation(s) within the areas targeted by this assay, and inadequate number of viral copies(<138 copies/mL). A negative result must be combined with clinical observations, patient history, and epidemiological information. The expected result is Negative.  Fact Sheet for Patients:  BloggerCourse.com  Fact Sheet for Healthcare Providers:  SeriousBroker.it  This test is no t yet approved or cleared by the [place] FDA and  has been authorized for detection and/or diagnosis of SARS-CoV-2 by FDA under an Emergency Use Authorization (EUA). This EUA will remain  in effect (meaning this test can be used) for the duration of the COVID-19 declaration under Section 564(b)(1) of the Act, 21 U.S.C.section 360bbb-3(b)(1), unless the authorization is terminated  or revoked sooner.       Influenza A by PCR NEGATIVE NEGATIVE Final   Influenza B by PCR NEGATIVE NEGATIVE Final     Comment: (NOTE) The Xpert Xpress SARS-CoV-2/FLU/RSV plus assay is intended as an aid in the diagnosis of influenza from Nasopharyngeal swab specimens and should not be used as a sole basis for treatment. Nasal washings and aspirates are unacceptable for Xpert Xpress SARS-CoV-2/FLU/RSV testing.  Fact Sheet for Patients: BloggerCourse.com  Fact Sheet for Healthcare Providers: SeriousBroker.it  This test is not yet approved or cleared by the [place] FDA and has been authorized for detection and/or diagnosis of SARS-CoV-2 by FDA under an Emergency Use Authorization (EUA). This EUA will remain in effect (meaning this test can be used) for the duration of the COVID-19 declaration under Section 564(b)(1) of the Act, 21 U.S.C. section 360bbb-3(b)(1), unless the authorization is terminated or revoked.  Performed at [facility], [address], [place], [place] [address]       Radiology Studies: MR ANGIO HEAD WO CONTRAST  Result Date: 01/06/2021 CLINICAL DATA:  Initial evaluation for acute TIA, right-sided weakness./ EXAM: MRI HEAD WITHOUT CONTRAST MRA HEAD WITHOUT CONTRAST MRA NECK WITHOUT AND WITH CONTRAST TECHNIQUE: Multiplanar, multi-echo pulse sequences of the brain and surrounding structures were acquired without intravenous contrast. Angiographic images of the Circle of Willis were acquired using MRA technique without intravenous contrast. Angiographic images of the neck were acquired using MRA technique without and with intravenous contrast. Carotid stenosis measurements (when applicable) are obtained utilizing NASCET criteria, using the distal internal carotid diameter as the denominator. CONTRAST:  5mL GADAVIST GADOBUTROL 1 MMOL/ML IV SOLN COMPARISON:  None available. FINDINGS: MRI HEAD FINDINGS Brain: Diffuse prominence of the CSF containing spaces compatible generalized age-related cerebral atrophy. Patchy T2/FLAIR  hyperintensity within the periventricular and deep white matter both cerebral hemispheres most consistent with chronic small vessel ischemic disease, mild for age. There is an approximate 1.9 cm curvilinear focus of restricted diffusion involving the cortical and subcortical aspect of the parasagittal posterior left frontal lobe (series 5, images 93-89, consistent with an acute ischemic infarct, left ACA distribution. Additional punctate focus of restricted diffusion also noted posteriorly at the parasagittal left parietal lobe (series 5, image 87). No associated hemorrhage or mass effect. No other evidence for acute or subacute ischemia. Gray-white matter differentiation otherwise maintained. No encephalomalacia to suggest chronic cortical infarction elsewhere within the brain. No foci of susceptibility artifact to suggest acute or chronic intracranial hemorrhage. Note made of a 6 mm rounded T2 hypointense lesion at the left temporal convexity, likely a small excrescence related to the adjacent left temporal bone. No other mass lesion, midline shift or mass effect. Diffuse ventricular prominence related global parenchymal volume loss without hydrocephalus. Pituitary gland suprasellar region normal. Midline  structures intact. Vascular: Major intracranial vascular flow voids are maintained. Skull and upper cervical spine: Prominent degenerative thickening of the tectorial membrane and pannus formation noted about the dens with secondary mild narrowing at the craniocervical junction. Bone marrow signal intensity within normal limits. No scalp soft tissue abnormality. Sinuses/Orbits: Patient status post ocular lens replacement. Globes and orbital soft tissues demonstrate no acute finding. Left-to-right nasal septal deviation with associated concha bullosa. Paranasal sinuses are clear. No mastoid effusion. Inner ear structures grossly normal. Other: None. MRA HEAD FINDINGS ANTERIOR CIRCULATION: Visualized distal  cervical segments of the internal carotid arteries are patent with antegrade flow. Petrous segments widely patent. Scattered atheromatous irregularity throughout the carotid siphons without flow-limiting stenosis. Cavernous left ICA irregular and somewhat diffusely ectatic. Superimposed 5 mm aneurysm seen projecting medially from the cavernous left ICA. This is better seen on corresponding MRA portion of this study (series 1069, image 7). Left A1 segment widely patent. Right A1 hypoplastic and/or absent, accounting for the diminutive right ICA is compared to the left. Normal anterior communicating artery complex. Visualized ACAs widely patent to their distal aspects. No M1 stenosis or occlusion. Normal MCA bifurcations. MCA branches well perfused and symmetric. Diffuse small vessel atheromatous irregularity. POSTERIOR CIRCULATION: Dominant left V4 segment widely patent to the vertebrobasilar junction. Diminutive right vertebral artery largely terminates in PICA, although a tiny branch ascending towards the vertebrobasilar junction. Right PICA patent. Left PICA not seen. Basilar patent to its distal aspect without stenosis. Superior cerebellar arteries patent bilaterally. Both PCAs primarily supplied via the basilar well perfused to their distal aspects. MRA NECK FINDINGS AORTIC ARCH: Visualized aortic arch within normal limits for caliber with normal branch pattern. No hemodynamically significant stenosis seen about the origin of the great vessels. RIGHT CAROTID SYSTEM: Right CCA tortuous but patent to its distal aspect without stenosis. Atheromatous irregularity about the right carotid bulb with estimated stenosis of 40-50% by NASCET criteria (series 1063, image 5). Right ICA tortuous but otherwise patent distally to the skull base without stenosis, evidence for dissection, or occlusion. LEFT CAROTID SYSTEM: Left CCA tortuous but is widely patent to the bifurcation without stenosis. Mild atheromatous irregularity  about the left carotid bulb without significant stenosis. Left ICA tortuous but otherwise patent to the skull base without stenosis, evidence for dissection or occlusion. VERTEBRAL ARTERIES: Both vertebral arteries arise from subclavian arteries. Left vertebral artery dominant, with a diffusely hypoplastic right vertebral artery. No proximal subclavian artery stenosis. 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 Vertebral arteries tortuous but otherwise patent without stenosis, evidence for dissection or occlusion. IMPRESSION: MRI HEAD IMPRESSION: 1. 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 2. Underlying age-related cerebral atrophy with mild chronic small vessel ischemic disease. MRA HEAD IMPRESSION: 1. Negative intracranial MRA for large vessel occlusion. 2. Diffuse small vessel atheromatous irregularity throughout the intracranial circulation. No proximal high-grade or correctable stenosis. 3. 5 mm aneurysm projecting medially from the cavernous left ICA. This is better appreciated on corresponding MRA neck portion of this exam. MRA NECK IMPRESSION: 1. 40-50% atheromatous stenosis about the right carotid bulb. Otherwise wide patency of both carotid artery systems within the neck. 2. 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 Vertebral arteries otherwise widely patent within the neck. 3. Diffuse tortuosity of the major arterial vasculature of the neck, suggesting chronic underlying hypertension. Electronically Signed   By: Rise Mu M.D.   On: 01/06/2021 20:37   MR Angiogram Neck  W or Wo Contrast  Result Date: 01/06/2021 CLINICAL DATA:  Initial evaluation for acute TIA, right-sided weakness./ EXAM: MRI HEAD WITHOUT CONTRAST MRA HEAD WITHOUT CONTRAST MRA NECK WITHOUT AND WITH CONTRAST TECHNIQUE: Multiplanar, multi-echo pulse sequences of the brain and surrounding structures were acquired  without intravenous contrast. Angiographic images of the Circle of Willis were acquired using MRA technique without intravenous contrast. Angiographic images of the neck were acquired using MRA technique without and with intravenous contrast. Carotid stenosis measurements (when applicable) are obtained utilizing NASCET criteria, using the distal internal carotid diameter as the denominator. CONTRAST:  5mL GADAVIST GADOBUTROL 1 MMOL/ML IV SOLN COMPARISON:  None available. FINDINGS: MRI HEAD FINDINGS Brain: Diffuse prominence of the CSF containing spaces compatible generalized age-related cerebral atrophy. Patchy T2/FLAIR hyperintensity within the periventricular and deep white matter both cerebral hemispheres most consistent with chronic small vessel ischemic disease, mild for age. There is an approximate 1.9 cm curvilinear focus of restricted diffusion involving the cortical and subcortical aspect of the parasagittal posterior left frontal lobe (series 5, images 93-89, consistent with an acute ischemic infarct, left ACA distribution. Additional punctate focus of restricted diffusion also noted posteriorly at the parasagittal left parietal lobe (series 5, image 87). No associated hemorrhage or mass effect. No other evidence for acute or subacute ischemia. Gray-white matter differentiation otherwise maintained. No encephalomalacia to suggest chronic cortical infarction elsewhere within the brain. No foci of susceptibility artifact to suggest acute or chronic intracranial hemorrhage. Note made of a 6 mm rounded T2 hypointense lesion at the left temporal convexity, likely a small excrescence related to the adjacent left temporal bone. No other mass lesion, midline shift or mass effect. Diffuse ventricular prominence related global parenchymal volume loss without hydrocephalus. Pituitary gland suprasellar region normal. Midline structures intact. Vascular: Major intracranial vascular flow voids are maintained. Skull and  upper cervical spine: Prominent degenerative thickening of the tectorial membrane and pannus formation noted about the dens with secondary mild narrowing at the craniocervical junction. Bone marrow signal intensity within normal limits. No scalp soft tissue abnormality. Sinuses/Orbits: Patient status post ocular lens replacement. Globes and orbital soft tissues demonstrate no acute finding. Left-to-right nasal septal deviation with associated concha bullosa. Paranasal sinuses are clear. No mastoid effusion. Inner ear structures grossly normal. Other: None. MRA HEAD FINDINGS ANTERIOR CIRCULATION: Visualized distal cervical segments of the internal carotid arteries are patent with antegrade flow. Petrous segments widely patent. Scattered atheromatous irregularity throughout the carotid siphons without flow-limiting stenosis. Cavernous left ICA irregular and somewhat diffusely ectatic. Superimposed 5 mm aneurysm seen projecting medially from the cavernous left ICA. This is better seen on corresponding MRA portion of this study (series 1069, image 7). Left A1 segment widely patent. Right A1 hypoplastic and/or absent, accounting for the diminutive right ICA is compared to the left. Normal anterior communicating artery complex. Visualized ACAs widely patent to their distal aspects. No M1 stenosis or occlusion. Normal MCA bifurcations. MCA branches well perfused and symmetric. Diffuse small vessel atheromatous irregularity. POSTERIOR CIRCULATION: Dominant left V4 segment widely patent to the vertebrobasilar junction. Diminutive right vertebral artery largely terminates in PICA, although a tiny branch ascending towards the vertebrobasilar junction. Right PICA patent. Left PICA not seen. Basilar patent to its distal aspect without stenosis. Superior cerebellar arteries patent bilaterally. Both PCAs primarily supplied via the basilar well perfused to their distal aspects. MRA NECK FINDINGS AORTIC ARCH: Visualized aortic arch  within normal limits for caliber with normal branch pattern. No hemodynamically significant stenosis seen about the origin of  the great vessels. RIGHT CAROTID SYSTEM: Right CCA tortuous but patent to its distal aspect without stenosis. Atheromatous irregularity about the right carotid bulb with estimated stenosis of 40-50% by NASCET criteria (series 1063, image 5). Right ICA tortuous but otherwise patent distally to the skull base without stenosis, evidence for dissection, or occlusion. LEFT CAROTID SYSTEM: Left CCA tortuous but is widely patent to the bifurcation without stenosis. Mild atheromatous irregularity about the left carotid bulb without significant stenosis. Left ICA tortuous but otherwise patent to the skull base without stenosis, evidence for dissection or occlusion. VERTEBRAL ARTERIES: Both vertebral arteries arise from subclavian arteries. Left vertebral artery dominant, with a diffusely hypoplastic right vertebral artery. No proximal subclavian artery stenosis. 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 Vertebral arteries tortuous but otherwise patent without stenosis, evidence for dissection or occlusion. IMPRESSION: MRI HEAD IMPRESSION: 1. 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 2. Underlying age-related cerebral atrophy with mild chronic small vessel ischemic disease. MRA HEAD IMPRESSION: 1. Negative intracranial MRA for large vessel occlusion. 2. Diffuse small vessel atheromatous irregularity throughout the intracranial circulation. No proximal high-grade or correctable stenosis. 3. 5 mm aneurysm projecting medially from the cavernous left ICA. This is better appreciated on corresponding MRA neck portion of this exam. MRA NECK IMPRESSION: 1. 40-50% atheromatous stenosis about the right carotid bulb. Otherwise wide patency of both carotid artery systems within the neck. 2. Moderate stenosis at the origin of the  hypoplastic right vertebral artery. Vertebral arteries otherwise widely patent within the neck. 3. Diffuse tortuosity of the major arterial vasculature of the neck, suggesting chronic underlying hypertension. Electronically Signed   By: Rise MuBenjamin  McClintock M.D.   On: 01/06/2021 20:37   MR BRAIN WO CONTRAST  Result Date: 01/06/2021 CLINICAL DATA:  Initial evaluation for acute TIA, right-sided weakness./ EXAM: MRI HEAD WITHOUT CONTRAST MRA HEAD WITHOUT CONTRAST MRA NECK WITHOUT AND WITH CONTRAST TECHNIQUE: Multiplanar, multi-echo pulse sequences of the brain and surrounding structures were acquired without intravenous contrast. Angiographic images of the Circle of Willis were acquired using MRA technique without intravenous contrast. Angiographic images of the neck were acquired using MRA technique without and with intravenous contrast. Carotid stenosis measurements (when applicable) are obtained utilizing NASCET criteria, using the distal internal carotid diameter as the denominator. CONTRAST:  5mL GADAVIST GADOBUTROL 1 MMOL/ML IV SOLN COMPARISON:  None available. FINDINGS: MRI HEAD FINDINGS Brain: Diffuse prominence of the CSF containing spaces compatible generalized age-related cerebral atrophy. Patchy T2/FLAIR hyperintensity within the periventricular and deep white matter both cerebral hemispheres most consistent with chronic small vessel ischemic disease, mild for age. There is an approximate 1.9 cm curvilinear focus of restricted diffusion involving the cortical and subcortical aspect of the parasagittal posterior left frontal lobe (series 5, images 93-89, consistent with an acute ischemic infarct, left ACA distribution. Additional punctate focus of restricted diffusion also noted posteriorly at the parasagittal left parietal lobe (series 5, image 87). No associated hemorrhage or mass effect. No other evidence for acute or subacute ischemia. Gray-white matter differentiation otherwise maintained. No  encephalomalacia to suggest chronic cortical infarction elsewhere within the brain. No foci of susceptibility artifact to suggest acute or chronic intracranial hemorrhage. Note made of a 6 mm rounded T2 hypointense lesion at the left temporal convexity, likely a small excrescence related to the adjacent left temporal bone. No other mass lesion, midline shift or mass effect. Diffuse ventricular prominence related global parenchymal volume loss without hydrocephalus. Pituitary gland suprasellar region  normal. Midline structures intact. Vascular: Major intracranial vascular flow voids are maintained. Skull and upper cervical spine: Prominent degenerative thickening of the tectorial membrane and pannus formation noted about the dens with secondary mild narrowing at the craniocervical junction. Bone marrow signal intensity within normal limits. No scalp soft tissue abnormality. Sinuses/Orbits: Patient status post ocular lens replacement. Globes and orbital soft tissues demonstrate no acute finding. Left-to-right nasal septal deviation with associated concha bullosa. Paranasal sinuses are clear. No mastoid effusion. Inner ear structures grossly normal. Other: None. MRA HEAD FINDINGS ANTERIOR CIRCULATION: Visualized distal cervical segments of the internal carotid arteries are patent with antegrade flow. Petrous segments widely patent. Scattered atheromatous irregularity throughout the carotid siphons without flow-limiting stenosis. Cavernous left ICA irregular and somewhat diffusely ectatic. Superimposed 5 mm aneurysm seen projecting medially from the cavernous left ICA. This is better seen on corresponding MRA portion of this study (series 1069, image 7). Left A1 segment widely patent. Right A1 hypoplastic and/or absent, accounting for the diminutive right ICA is compared to the left. Normal anterior communicating artery complex. Visualized ACAs widely patent to their distal aspects. No M1 stenosis or occlusion. Normal  MCA bifurcations. MCA branches well perfused and symmetric. Diffuse small vessel atheromatous irregularity. POSTERIOR CIRCULATION: Dominant left V4 segment widely patent to the vertebrobasilar junction. Diminutive right vertebral artery largely terminates in PICA, although a tiny branch ascending towards the vertebrobasilar junction. Right PICA patent. Left PICA not seen. Basilar patent to its distal aspect without stenosis. Superior cerebellar arteries patent bilaterally. Both PCAs primarily supplied via the basilar well perfused to their distal aspects. MRA NECK FINDINGS AORTIC ARCH: Visualized aortic arch within normal limits for caliber with normal branch pattern. No hemodynamically significant stenosis seen about the origin of the great vessels. RIGHT CAROTID SYSTEM: Right CCA tortuous but patent to its distal aspect without stenosis. Atheromatous irregularity about the right carotid bulb with estimated stenosis of 40-50% by NASCET criteria (series 1063, image 5). Right ICA tortuous but otherwise patent distally to the skull base without stenosis, evidence for dissection, or occlusion. LEFT CAROTID SYSTEM: Left CCA tortuous but is widely patent to the bifurcation without stenosis. Mild atheromatous irregularity about the left carotid bulb without significant stenosis. Left ICA tortuous but otherwise patent to the skull base without stenosis, evidence for dissection or occlusion. VERTEBRAL ARTERIES: Both vertebral arteries arise from subclavian arteries. Left vertebral artery dominant, with a diffusely hypoplastic right vertebral artery. No proximal subclavian artery stenosis. 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 Vertebral arteries tortuous but otherwise patent without stenosis, evidence for dissection or occlusion. IMPRESSION: MRI HEAD IMPRESSION: 1. 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 2. Underlying age-related  cerebral atrophy with mild chronic small vessel ischemic disease. MRA HEAD IMPRESSION: 1. Negative intracranial MRA for large vessel occlusion. 2. Diffuse small vessel atheromatous irregularity throughout the intracranial circulation. No proximal high-grade or correctable stenosis. 3. 5 mm aneurysm projecting medially from the cavernous left ICA. This is better appreciated on corresponding MRA neck portion of this exam. MRA NECK IMPRESSION: 1. 40-50% atheromatous stenosis about the right carotid bulb. Otherwise wide patency of both carotid artery systems within the neck. 2. 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 Vertebral arteries otherwise widely patent within the neck. 3. Diffuse tortuosity of the major arterial vasculature of the neck, suggesting chronic underlying hypertension. Electronically Signed   By: Rise Mu M.D.   On: 01/06/2021 20:37   DG  Chest Portable 1 View  Result Date: 01/06/2021 CLINICAL DATA:  weakness EXAM: PORTABLE CHEST - 1 VIEW COMPARISON:  06/12/2014 FINDINGS: The mediastinal contours are within normal limits. Stable mild cardiomegaly. The lungs are clear bilaterally without evidence of focal consolidation, pleural effusion, or pneumothorax. Atherosclerotic calcifications of the thoracic aorta. No acute osseous abnormality. IMPRESSION: 1. No acute cardiopulmonary process. 2. Stable cardiomegaly. 3.  Aortic Atherosclerosis (ICD10-I70.0). Electronically Signed   By: Marliss Coots MD   On: 01/06/2021 12:47   ECHOCARDIOGRAM COMPLETE  Result Date: 01/07/2021    ECHOCARDIOGRAM REPORT   Patient Name:   ALLY KNODEL Date of Exam: 01/07/2021 Medical Rec #:  [id number]      Height:       66.0 in Accession #:    [id number]     Weight:       170.9 lb Date of Birth:  09-09-1925      BSA:          1.870 m Patient Age:    89 years       BP:           148/62 mmHg Patient Gender: F              HR:           63 bpm. Exam Location:  Inpatient Procedure:  2D Echo, Cardiac Doppler and Color Doppler Indications:    TIA  History:        Patient has no prior history of Echocardiogram examinations. CAD                 and Previous Myocardial Infarction, Arrythmias:Atrial                 Fibrillation; Risk Factors:Hypertension. GERD.  Sonographer:    Ross Ludwig RDCS (AE) Referring Phys: [id number] RONDELL A SMITH IMPRESSIONS  1. Left ventricular ejection fraction, by estimation, is 60 to 65%. The left ventricle has normal function. The left ventricle has no regional wall motion abnormalities. There is moderate left ventricular hypertrophy. Left ventricular diastolic function  could not be evaluated. Elevated left atrial pressure.  2. Right ventricular systolic function is normal. The right ventricular size is normal. There is normal pulmonary artery systolic pressure.  3. Left atrial size was severely dilated.  4. The mitral valve is normal in structure. Mild mitral valve regurgitation. No evidence of mitral stenosis. Moderate mitral annular calcification.  5. The aortic valve is tricuspid. Aortic valve regurgitation is mild. Mild aortic valve sclerosis is present, with no evidence of aortic valve stenosis.  6. The inferior vena cava is dilated in size with >50% respiratory variability, suggesting right atrial pressure of 8 mmHg. FINDINGS  Left Ventricle: Left ventricular ejection fraction, by estimation, is 60 to 65%. The left ventricle has normal function. The left ventricle has no regional wall motion abnormalities. The left ventricular internal cavity size was normal in size. There is  moderate left ventricular hypertrophy. Left ventricular diastolic function could not be evaluated due to atrial fibrillation. Left ventricular diastolic function could not be evaluated. Elevated left atrial pressure. Right Ventricle: The right ventricular size is normal.Right ventricular systolic function is normal. There is normal pulmonary artery systolic pressure. The tricuspid  regurgitant velocity is 2.56 m/s, and with an assumed right atrial pressure of 8 mmHg, the estimated right ventricular systolic pressure is 34.2 mmHg. Left Atrium: Left atrial size was severely dilated. Right Atrium: Right atrial size was normal in size. Pericardium: There is no evidence of  pericardial effusion. Mitral Valve: The mitral valve is normal in structure. Moderate mitral annular calcification. Mild mitral valve regurgitation. No evidence of mitral valve stenosis. MV peak gradient, 10.6 mmHg. The mean mitral valve gradient is 1.0 mmHg. Tricuspid Valve: The tricuspid valve is normal in structure. Tricuspid valve regurgitation is mild . No evidence of tricuspid stenosis. Aortic Valve: The aortic valve is tricuspid. Aortic valve regurgitation is mild. Aortic regurgitation PHT measures 561 msec. Mild aortic valve sclerosis is present, with no evidence of aortic valve stenosis. Aortic valve mean gradient measures 4.0 mmHg. Aortic valve peak gradient measures 7.0 mmHg. Pulmonic Valve: The pulmonic valve was normal in structure. Pulmonic valve regurgitation is not visualized. No evidence of pulmonic stenosis. Aorta: The aortic root is normal in size and structure. Venous: The inferior vena cava is dilated in size with greater than 50% respiratory variability, suggesting right atrial pressure of 8 mmHg.   LEFT VENTRICLE PLAX 2D LVIDd:         3.50 cm  Diastology LVIDs:         2.40 cm  LV e' medial:    5.55 cm/s LV PW:         1.50 cm  LV E/e' medial:  25.6 LV IVS:        1.50 cm  LV e' lateral:   8.16 cm/s LVOT diam:     1.80 cm  LV E/e' lateral: 17.4 LV SV:         35 LV SV Index:   19 LVOT Area:     2.54 cm  RIGHT VENTRICLE            IVC RV Basal diam:  3.40 cm    IVC diam: 2.20 cm RV S prime:     7.51 cm/s TAPSE (M-mode): 2.2 cm LEFT ATRIUM              Index       RIGHT ATRIUM           Index LA diam:        4.70 cm  2.51 cm/m  RA Area:     17.60 cm LA Vol (A2C):   181.0 ml 96.77 ml/m RA Volume:   42.70  ml  22.83 ml/m LA Vol (A4C):   111.0 ml 59.35 ml/m LA Biplane Vol: 144.0 ml 76.99 ml/m  AORTIC VALVE AV Area (Vmax):    1.27 cm AV Area (Vmean):   1.26 cm AV Area (VTI):     1.24 cm AV Vmax:           132.00 cm/s AV Vmean:          87.500 cm/s AV VTI:            0.283 m AV Peak Grad:      7.0 mmHg AV Mean Grad:      4.0 mmHg LVOT Vmax:         65.80 cm/s LVOT Vmean:        43.400 cm/s LVOT VTI:          0.138 m LVOT/AV VTI ratio: 0.49 AI PHT:            561 msec  AORTA Ao Root diam: 3.00 cm Ao Asc diam:  3.00 cm MITRAL VALVE                TRICUSPID VALVE MV Area (PHT): 3.91 cm     TR Peak grad:   26.2 mmHg MV Area VTI:  0.89 cm     TR Vmax:        256.00 cm/s MV Peak grad:  10.6 mmHg MV Mean grad:  1.0 mmHg     SHUNTS MV Vmax:       1.63 m/s     Systemic VTI:  0.14 m MV Vmean:      47.0 cm/s    Systemic Diam: 1.80 cm MV Decel Time: 194 msec MV E velocity: 142.00 cm/s MV A velocity: 36.70 cm/s MV E/A ratio:  3.87 Olga Millers MD Electronically signed by Olga Millers MD Signature Date/Time: 01/07/2021/10:45:22 AM    Final     Scheduled Meds: .  stroke: mapping our early stages of recovery book   Does not apply Once  . aspirin  81 mg Oral Daily  . citalopram  20 mg Oral Daily  . ezetimibe  10 mg Oral QHS   And  . simvastatin  40 mg Oral QHS  . metoprolol tartrate  50 mg Oral BID   Continuous Infusions:   LOS: 0 days   Time spent: 35 minutes.  Tyrone Nine, MD [facility] [email or web address] 01/07/2021, 2:10 PM

## 2021-01-07 NOTE — Progress Notes (Addendum)
STROKE TEAM PROGRESS NOTE   INTERVAL HISTORY No acute events VSS. Plt count a bit low at 146.  Patient patient is sitting up in chair at bedside eating fruit in NAD. She recalls onset of R sided weakness while bathing yesterday. She bathes from her toilet with over the toilet seat in place. She noticed she was unable to grip the arm of the seat and then that she could not use her right leg to stand. She does not recall any confusion or difficulty speaking as reported by son. Today she is feeling "alright" and reporting some improvement in her right sided weakness/coordination. She denies any new symptoms or complaints.  Also denies any recent bleeding concerns.  We reviewed her brain imaging, ongoing work up and discussed the new finding of atrial fibrillation and recommendation for anticoagulation with Eliquis. She is agreeable to considering Eliquis therapy. Her son arrived and we further discussed this recommendation. We also discussed CIR recommendation for discharge.  They are both agreeable to that plan. Their questions were answered.   Vitals:   01/06/21 2100 01/06/21 2200 01/06/21 2300 01/07/21 0332  BP: (!) 151/82 (!) 143/54 (!) 166/63 (!) 148/62  Pulse: 66 64 61 63  Resp: (!) 21 17 16 18   Temp:   97.9 F (36.6 C) 97.9 F (36.6 C)  TempSrc:   Oral Oral  SpO2: 97% 94% 94% 92%  Weight:  77.5 kg    Height:  5\' 6"  (1.676 m)     CBC:  Recent Labs  Lab 01/06/21 1310  WBC 7.8  NEUTROABS 5.9  HGB 12.6  HCT 39.8  MCV 95.7  PLT 146*   Basic Metabolic Panel:  Recent Labs  Lab 01/06/21 1230  NA 135  K 4.2  CL 102  CO2 24  GLUCOSE 145*  BUN 29*  CREATININE 1.17*  CALCIUM 8.9  MG 2.0   Lipid Panel:  Recent Labs  Lab 01/07/21 0126  CHOL 95  TRIG 52  HDL 41  CHOLHDL 2.3  VLDL 10  LDLCALC 44   HgbA1c: No results for input(s): HGBA1C in the last 168 hours. Urine Drug Screen: No results for input(s): LABOPIA, COCAINSCRNUR, LABBENZ, AMPHETMU, THCU, LABBARB in the last  168 hours.  Alcohol Level No results for input(s): ETH in the last 168 hours.  IMAGING past 24 hours MR ANGIO HEAD WO CONTRAST  Result Date: 01/06/2021 CLINICAL DATA:  Initial evaluation for acute TIA, right-sided weakness./ EXAM: MRI HEAD WITHOUT CONTRAST MRA HEAD WITHOUT CONTRAST MRA NECK WITHOUT AND WITH CONTRAST TECHNIQUE: Multiplanar, multi-echo pulse sequences of the brain and surrounding structures were acquired without intravenous contrast. Angiographic images of the Circle of Willis were acquired using MRA technique without intravenous contrast. Angiographic images of the neck were acquired using MRA technique without and with intravenous contrast. Carotid stenosis measurements (when applicable) are obtained utilizing NASCET criteria, using the distal internal carotid diameter as the denominator. CONTRAST:  5mL GADAVIST GADOBUTROL 1 MMOL/ML IV SOLN COMPARISON:  None available. FINDINGS: MRI HEAD FINDINGS Brain: Diffuse prominence of the CSF containing spaces compatible generalized age-related cerebral atrophy. Patchy T2/FLAIR hyperintensity within the periventricular and deep white matter both cerebral hemispheres most consistent with chronic small vessel ischemic disease, mild for age. There is an approximate 1.9 cm curvilinear focus of restricted diffusion involving the cortical and subcortical aspect of the parasagittal posterior left frontal lobe (series 5, images 93-89, consistent with an acute ischemic infarct, left ACA distribution. Additional punctate focus of restricted diffusion also noted posteriorly at  the parasagittal left parietal lobe (series 5, image 87). No associated hemorrhage or mass effect. No other evidence for acute or subacute ischemia. Gray-white matter differentiation otherwise maintained. No encephalomalacia to suggest chronic cortical infarction elsewhere within the brain. No foci of susceptibility artifact to suggest acute or chronic intracranial hemorrhage. Note made of a  6 mm rounded T2 hypointense lesion at the left temporal convexity, likely a small excrescence related to the adjacent left temporal bone. No other mass lesion, midline shift or mass effect. Diffuse ventricular prominence related global parenchymal volume loss without hydrocephalus. Pituitary gland suprasellar region normal. Midline structures intact. Vascular: Major intracranial vascular flow voids are maintained. Skull and upper cervical spine: Prominent degenerative thickening of the tectorial membrane and pannus formation noted about the dens with secondary mild narrowing at the craniocervical junction. Bone marrow signal intensity within normal limits. No scalp soft tissue abnormality. Sinuses/Orbits: Patient status post ocular lens replacement. Globes and orbital soft tissues demonstrate no acute finding. Left-to-right nasal septal deviation with associated concha bullosa. Paranasal sinuses are clear. No mastoid effusion. Inner ear structures grossly normal. Other: None. MRA HEAD FINDINGS ANTERIOR CIRCULATION: Visualized distal cervical segments of the internal carotid arteries are patent with antegrade flow. Petrous segments widely patent. Scattered atheromatous irregularity throughout the carotid siphons without flow-limiting stenosis. Cavernous left ICA irregular and somewhat diffusely ectatic. Superimposed 5 mm aneurysm seen projecting medially from the cavernous left ICA. This is better seen on corresponding MRA portion of this study (series 1069, image 7). Left A1 segment widely patent. Right A1 hypoplastic and/or absent, accounting for the diminutive right ICA is compared to the left. Normal anterior communicating artery complex. Visualized ACAs widely patent to their distal aspects. No M1 stenosis or occlusion. Normal MCA bifurcations. MCA branches well perfused and symmetric. Diffuse small vessel atheromatous irregularity. POSTERIOR CIRCULATION: Dominant left V4 segment widely patent to the  vertebrobasilar junction. Diminutive right vertebral artery largely terminates in PICA, although a tiny branch ascending towards the vertebrobasilar junction. Right PICA patent. Left PICA not seen. Basilar patent to its distal aspect without stenosis. Superior cerebellar arteries patent bilaterally. Both PCAs primarily supplied via the basilar well perfused to their distal aspects. MRA NECK FINDINGS AORTIC ARCH: Visualized aortic arch within normal limits for caliber with normal branch pattern. No hemodynamically significant stenosis seen about the origin of the great vessels. RIGHT CAROTID SYSTEM: Right CCA tortuous but patent to its distal aspect without stenosis. Atheromatous irregularity about the right carotid bulb with estimated stenosis of 40-50% by NASCET criteria (series 1063, image 5). Right ICA tortuous but otherwise patent distally to the skull base without stenosis, evidence for dissection, or occlusion. LEFT CAROTID SYSTEM: Left CCA tortuous but is widely patent to the bifurcation without stenosis. Mild atheromatous irregularity about the left carotid bulb without significant stenosis. Left ICA tortuous but otherwise patent to the skull base without stenosis, evidence for dissection or occlusion. VERTEBRAL ARTERIES: Both vertebral arteries arise from subclavian arteries. Left vertebral artery dominant, with a diffusely hypoplastic right vertebral artery. No proximal subclavian artery stenosis. 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 Vertebral arteries tortuous but otherwise patent without stenosis, evidence for dissection or occlusion. IMPRESSION: MRI HEAD IMPRESSION: 1. 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 2. Underlying age-related cerebral atrophy with mild chronic small vessel ischemic disease. MRA HEAD IMPRESSION: 1. Negative intracranial MRA for large vessel occlusion. 2. Diffuse small vessel atheromatous  irregularity throughout the intracranial circulation. No proximal high-grade  or correctable stenosis. 3. 5 mm aneurysm projecting medially from the cavernous left ICA. This is better appreciated on corresponding MRA neck portion of this exam. MRA NECK IMPRESSION: 1. 40-50% atheromatous stenosis about the right carotid bulb. Otherwise wide patency of both carotid artery systems within the neck. 2. 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 Vertebral arteries otherwise widely patent within the neck. 3. Diffuse tortuosity of the major arterial vasculature of the neck, suggesting chronic underlying hypertension. Electronically Signed   By: Rise Mu M.D.   On: 01/06/2021 20:37   MR Angiogram Neck W or Wo Contrast  Result Date: 01/06/2021 CLINICAL DATA:  Initial evaluation for acute TIA, right-sided weakness./ EXAM: MRI HEAD WITHOUT CONTRAST MRA HEAD WITHOUT CONTRAST MRA NECK WITHOUT AND WITH CONTRAST TECHNIQUE: Multiplanar, multi-echo pulse sequences of the brain and surrounding structures were acquired without intravenous contrast. Angiographic images of the Circle of Willis were acquired using MRA technique without intravenous contrast. Angiographic images of the neck were acquired using MRA technique without and with intravenous contrast. Carotid stenosis measurements (when applicable) are obtained utilizing NASCET criteria, using the distal internal carotid diameter as the denominator. CONTRAST:  9mL GADAVIST GADOBUTROL 1 MMOL/ML IV SOLN COMPARISON:  None available. FINDINGS: MRI HEAD FINDINGS Brain: Diffuse prominence of the CSF containing spaces compatible generalized age-related cerebral atrophy. Patchy T2/FLAIR hyperintensity within the periventricular and deep white matter both cerebral hemispheres most consistent with chronic small vessel ischemic disease, mild for age. There is an approximate 1.9 cm curvilinear focus of restricted diffusion involving the cortical  and subcortical aspect of the parasagittal posterior left frontal lobe (series 5, images 93-89, consistent with an acute ischemic infarct, left ACA distribution. Additional punctate focus of restricted diffusion also noted posteriorly at the parasagittal left parietal lobe (series 5, image 87). No associated hemorrhage or mass effect. No other evidence for acute or subacute ischemia. Gray-white matter differentiation otherwise maintained. No encephalomalacia to suggest chronic cortical infarction elsewhere within the brain. No foci of susceptibility artifact to suggest acute or chronic intracranial hemorrhage. Note made of a 6 mm rounded T2 hypointense lesion at the left temporal convexity, likely a small excrescence related to the adjacent left temporal bone. No other mass lesion, midline shift or mass effect. Diffuse ventricular prominence related global parenchymal volume loss without hydrocephalus. Pituitary gland suprasellar region normal. Midline structures intact. Vascular: Major intracranial vascular flow voids are maintained. Skull and upper cervical spine: Prominent degenerative thickening of the tectorial membrane and pannus formation noted about the dens with secondary mild narrowing at the craniocervical junction. Bone marrow signal intensity within normal limits. No scalp soft tissue abnormality. Sinuses/Orbits: Patient status post ocular lens replacement. Globes and orbital soft tissues demonstrate no acute finding. Left-to-right nasal septal deviation with associated concha bullosa. Paranasal sinuses are clear. No mastoid effusion. Inner ear structures grossly normal. Other: None. MRA HEAD FINDINGS ANTERIOR CIRCULATION: Visualized distal cervical segments of the internal carotid arteries are patent with antegrade flow. Petrous segments widely patent. Scattered atheromatous irregularity throughout the carotid siphons without flow-limiting stenosis. Cavernous left ICA irregular and somewhat diffusely  ectatic. Superimposed 5 mm aneurysm seen projecting medially from the cavernous left ICA. This is better seen on corresponding MRA portion of this study (series 1069, image 7). Left A1 segment widely patent. Right A1 hypoplastic and/or absent, accounting for the diminutive right ICA is compared to the left. Normal anterior communicating artery complex. Visualized ACAs widely patent to their distal aspects. No M1 stenosis or occlusion.  Normal MCA bifurcations. MCA branches well perfused and symmetric. Diffuse small vessel atheromatous irregularity. POSTERIOR CIRCULATION: Dominant left V4 segment widely patent to the vertebrobasilar junction. Diminutive right vertebral artery largely terminates in PICA, although a tiny branch ascending towards the vertebrobasilar junction. Right PICA patent. Left PICA not seen. Basilar patent to its distal aspect without stenosis. Superior cerebellar arteries patent bilaterally. Both PCAs primarily supplied via the basilar well perfused to their distal aspects. MRA NECK FINDINGS AORTIC ARCH: Visualized aortic arch within normal limits for caliber with normal branch pattern. No hemodynamically significant stenosis seen about the origin of the great vessels. RIGHT CAROTID SYSTEM: Right CCA tortuous but patent to its distal aspect without stenosis. Atheromatous irregularity about the right carotid bulb with estimated stenosis of 40-50% by NASCET criteria (series 1063, image 5). Right ICA tortuous but otherwise patent distally to the skull base without stenosis, evidence for dissection, or occlusion. LEFT CAROTID SYSTEM: Left CCA tortuous but is widely patent to the bifurcation without stenosis. Mild atheromatous irregularity about the left carotid bulb without significant stenosis. Left ICA tortuous but otherwise patent to the skull base without stenosis, evidence for dissection or occlusion. VERTEBRAL ARTERIES: Both vertebral arteries arise from subclavian arteries. Left vertebral  artery dominant, with a diffusely hypoplastic right vertebral artery. No proximal subclavian artery stenosis. 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 Vertebral arteries tortuous but otherwise patent without stenosis, evidence for dissection or occlusion. IMPRESSION: MRI HEAD IMPRESSION: 1. 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 2. Underlying age-related cerebral atrophy with mild chronic small vessel ischemic disease. MRA HEAD IMPRESSION: 1. Negative intracranial MRA for large vessel occlusion. 2. Diffuse small vessel atheromatous irregularity throughout the intracranial circulation. No proximal high-grade or correctable stenosis. 3. 5 mm aneurysm projecting medially from the cavernous left ICA. This is better appreciated on corresponding MRA neck portion of this exam. MRA NECK IMPRESSION: 1. 40-50% atheromatous stenosis about the right carotid bulb. Otherwise wide patency of both carotid artery systems within the neck. 2. 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 Vertebral arteries otherwise widely patent within the neck. 3. Diffuse tortuosity of the major arterial vasculature of the neck, suggesting chronic underlying hypertension. Electronically Signed   By: Rise Mu M.D.   On: 01/06/2021 20:37   MR BRAIN WO CONTRAST  Result Date: 01/06/2021 CLINICAL DATA:  Initial evaluation for acute TIA, right-sided weakness./ EXAM: MRI HEAD WITHOUT CONTRAST MRA HEAD WITHOUT CONTRAST MRA NECK WITHOUT AND WITH CONTRAST TECHNIQUE: Multiplanar, multi-echo pulse sequences of the brain and surrounding structures were acquired without intravenous contrast. Angiographic images of the Circle of Willis were acquired using MRA technique without intravenous contrast. Angiographic images of the neck were acquired using MRA technique without and with intravenous contrast. Carotid stenosis measurements  (when applicable) are obtained utilizing NASCET criteria, using the distal internal carotid diameter as the denominator. CONTRAST:  5mL GADAVIST GADOBUTROL 1 MMOL/ML IV SOLN COMPARISON:  None available. FINDINGS: MRI HEAD FINDINGS Brain: Diffuse prominence of the CSF containing spaces compatible generalized age-related cerebral atrophy. Patchy T2/FLAIR hyperintensity within the periventricular and deep white matter both cerebral hemispheres most consistent with chronic small vessel ischemic disease, mild for age. There is an approximate 1.9 cm curvilinear focus of restricted diffusion involving the cortical and subcortical aspect of the parasagittal posterior left frontal lobe (series 5, images 93-89, consistent with an acute ischemic infarct, left ACA distribution. Additional punctate focus of restricted diffusion also noted  posteriorly at the parasagittal left parietal lobe (series 5, image 87). No associated hemorrhage or mass effect. No other evidence for acute or subacute ischemia. Gray-white matter differentiation otherwise maintained. No encephalomalacia to suggest chronic cortical infarction elsewhere within the brain. No foci of susceptibility artifact to suggest acute or chronic intracranial hemorrhage. Note made of a 6 mm rounded T2 hypointense lesion at the left temporal convexity, likely a small excrescence related to the adjacent left temporal bone. No other mass lesion, midline shift or mass effect. Diffuse ventricular prominence related global parenchymal volume loss without hydrocephalus. Pituitary gland suprasellar region normal. Midline structures intact. Vascular: Major intracranial vascular flow voids are maintained. Skull and upper cervical spine: Prominent degenerative thickening of the tectorial membrane and pannus formation noted about the dens with secondary mild narrowing at the craniocervical junction. Bone marrow signal intensity within normal limits. No scalp soft tissue abnormality.  Sinuses/Orbits: Patient status post ocular lens replacement. Globes and orbital soft tissues demonstrate no acute finding. Left-to-right nasal septal deviation with associated concha bullosa. Paranasal sinuses are clear. No mastoid effusion. Inner ear structures grossly normal. Other: None. MRA HEAD FINDINGS ANTERIOR CIRCULATION: Visualized distal cervical segments of the internal carotid arteries are patent with antegrade flow. Petrous segments widely patent. Scattered atheromatous irregularity throughout the carotid siphons without flow-limiting stenosis. Cavernous left ICA irregular and somewhat diffusely ectatic. Superimposed 5 mm aneurysm seen projecting medially from the cavernous left ICA. This is better seen on corresponding MRA portion of this study (series 1069, image 7). Left A1 segment widely patent. Right A1 hypoplastic and/or absent, accounting for the diminutive right ICA is compared to the left. Normal anterior communicating artery complex. Visualized ACAs widely patent to their distal aspects. No M1 stenosis or occlusion. Normal MCA bifurcations. MCA branches well perfused and symmetric. Diffuse small vessel atheromatous irregularity. POSTERIOR CIRCULATION: Dominant left V4 segment widely patent to the vertebrobasilar junction. Diminutive right vertebral artery largely terminates in PICA, although a tiny branch ascending towards the vertebrobasilar junction. Right PICA patent. Left PICA not seen. Basilar patent to its distal aspect without stenosis. Superior cerebellar arteries patent bilaterally. Both PCAs primarily supplied via the basilar well perfused to their distal aspects. MRA NECK FINDINGS AORTIC ARCH: Visualized aortic arch within normal limits for caliber with normal branch pattern. No hemodynamically significant stenosis seen about the origin of the great vessels. RIGHT CAROTID SYSTEM: Right CCA tortuous but patent to its distal aspect without stenosis. Atheromatous irregularity about the  right carotid bulb with estimated stenosis of 40-50% by NASCET criteria (series 1063, image 5). Right ICA tortuous but otherwise patent distally to the skull base without stenosis, evidence for dissection, or occlusion. LEFT CAROTID SYSTEM: Left CCA tortuous but is widely patent to the bifurcation without stenosis. Mild atheromatous irregularity about the left carotid bulb without significant stenosis. Left ICA tortuous but otherwise patent to the skull base without stenosis, evidence for dissection or occlusion. VERTEBRAL ARTERIES: Both vertebral arteries arise from subclavian arteries. Left vertebral artery dominant, with a diffusely hypoplastic right vertebral artery. No proximal subclavian artery stenosis. 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 Vertebral arteries tortuous but otherwise patent without stenosis, evidence for dissection or occlusion. IMPRESSION: MRI HEAD IMPRESSION: 1. 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 2. Underlying age-related cerebral atrophy with mild chronic small vessel ischemic disease. MRA HEAD IMPRESSION: 1. Negative intracranial MRA for large vessel occlusion. 2. Diffuse small vessel atheromatous irregularity throughout the intracranial circulation. No proximal  high-grade or correctable stenosis. 3. 5 mm aneurysm projecting medially from the cavernous left ICA. This is better appreciated on corresponding MRA neck portion of this exam. MRA NECK IMPRESSION: 1. 40-50% atheromatous stenosis about the right carotid bulb. Otherwise wide patency of both carotid artery systems within the neck. 2. 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 Vertebral arteries otherwise widely patent within the neck. 3. Diffuse tortuosity of the major arterial vasculature of the neck, suggesting chronic underlying hypertension. Electronically Signed   By: Rise Mu M.D.   On:  01/06/2021 20:37   DG Chest Portable 1 View  Result Date: 01/06/2021 CLINICAL DATA:  weakness EXAM: PORTABLE CHEST - 1 VIEW COMPARISON:  06/12/2014 FINDINGS: The mediastinal contours are within normal limits. Stable mild cardiomegaly. The lungs are clear bilaterally without evidence of focal consolidation, pleural effusion, or pneumothorax. Atherosclerotic calcifications of the thoracic aorta. No acute osseous abnormality. IMPRESSION: 1. No acute cardiopulmonary process. 2. Stable cardiomegaly. 3.  Aortic Atherosclerosis (ICD10-I70.0). Electronically Signed   By: Marliss Coots MD   On: 01/06/2021 12:47   PHYSICAL EXAM  GENERAL: Elderly female sitting up in chair eating in NAD.   HEENT: - Normocephalic and atraumatic RESP: No extra work of breathing   CV -Afib with rate in 70s per tele.  Ext: warm, well perfused Psych: calm, cooperative   NEURO:  Mental Status: AA&Ox4 Speech/Language: clear fluent speech. Names 3/3, repetition intact. Follows multi-step commands.  Cranial Nerves:  II: PERRL Visual fields full.  III, IV, VI: EOMI. Lid elevation symmetric and full.  V: sensation is intact and symmetrical to face.  VII: Face with questionable very slight right facial droop.  VIII:hearing intact to voice IX, X: palate elevation is symmetric. Phonation normal.  XI: normal sternocleidomastoid and trapezius muscle strength [id number] is symmetrical without fasciculations.   Motor: RUE: grips  5/5    triceps 5/5    biceps 5 /5            LUE: grips  5/5     triceps  5/5    biceps  5 /5       RLE:  knee 5/5  thigh 5/5   plantar flexion  5/5  dorsiflexion   5/5 on the right         LLE:  thigh  5/5 Exam limited to due LLE prosthesis.  Tone is normal. Bulk is normal.  Sensation- Intact to light touch bilaterally in all four extremities.   Coordination: FTN intact on the left, impaired slightly on the right. HKS intact bilaterally. No pronator drift.  Gait- deferred  ASSESSMENT/PLAN Ana Franco is a 87 y.o. 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 Per son, she was also a bit confused and seemed to be slurring her words. NIHSS 1. She was found to be in atrial fibrillation upon arrival.  Stroke - Posterior left frontal ischemic infarct likely due to known atrial fibrillation not on anticoagulation  Code Stroke CT head:  No acute abnormality.  MRI:   1.9 cm acute ischemic nonhemorrhagic posterior left frontal infarct, left ACA distribution  MRA: no LVO. Diffuse small vessel atheromatous irregularity throughout the intracranial circulation. No proximal high-grade or correctable stenosis.  2D Echo: 60-65% EF, moderate LVH, severely dilated left atrium with elevated pressure.  LDL 44  HgbA1c sent out and pending  VTE prophylaxis - SCDs  On ASA  prior to admission and currently, recommend Eliquis as anticoagulation for stroke prevention from atrial fibrillation.   Therapy recommendations:  CIR  Disposition:  CIR pending  Atrial fibrillation  Recommend anticoagulation with Eliquis  Saw cardiology September 2019: reluctance for anticoagulation due to patient's history of falls. No AC was started at that visit. Other than her amputation secondary to wound infection in 2019, she has little history of [facility] ED visits or stays  Thrombocytopenia   Plt 146 yesterday   On ASA 81 daily prior to admission, now off, on eliquis  Continue platelet monitoring with PCP  Management per primary team   Hypertension  Stable . Permissive hypertension (OK if < 220/120) but gradually normalize in 5-7 days . Long-term BP goal normotensive  Hyperlipidemia  Home meds: Vytorin 10/40, continued as patient is at goal   LDL 44, at goal < 70  Continue statin at  discharge  Cerebral aneurysm  MRA head 5 mm aneurysm projecting medially from the cavernous left ICA.  Incidental finding  Not intervention candidate at this time given the size of aneurysm, advanced age and other medical condition  Follow-up with neurology for close monitoring.  Other Stroke Risk Factors  Advanced Age >/= 24   Coronary artery disease  Other Active Problems    Hospital day # 0  Delila A Bailey-Modzik, NP-C  ATTENDING NOTE: I reviewed above note and agree with the assessment and plan. Pt was seen and examined.   87 year old female with history hypertension, PAF, CAD with STEMI, left BKA admitted for right-sided weakness, confusion and slurred speech which have been resolved.  In ER patient found to have A. fib, according to the chart, patient had previously A. fib documented in the past.  However, patient does not recall A. fib and was not on anticoagulation PTA.  MRI showed left PCA infarct.  MRA head and neck showed 40 to 50% stenosis right ICA bulb, and also 5 mm left cavernous ICA aneurysm.  EF 60 to 65%, LDL 44 and A1c pending.  Creatinine 1.17.  On exam, no family at bedside, patient awake, alert, eyes open, orientated to age, place, time. No aphasia, fluent language, following all simple commands, mild dysarthria. Able to name and repeat and read. No gaze palsy, tracking bilaterally, visual field full, PERRL. No facial droop. Tongue midline. LUEs 5/5, no drift. RUE proximal 4/5 due to chronic shoulder injury 06/2020 with fall, however, finger grip 4/5 bilaterally. RLEs 4/5 proximal and distal. LLE BKA but proximal 4/5. Sensation symmetrical bilaterally, b/l FTN intact but slow on the right due to shoulder injury, gait not tested.   Etiology for patient stroke likely due to paroxysmal A. fib not on anticoagulation.  Given small size of stroke, okay to start Eliquis today for stroke prevention.  Continue Vytorin.  PT/OT recommend CIR. for the left cavernous ICA 5  mm aneurysm found on MRA, patient not a candidate for intervention at this time given the size of aneurysm, advanced age and other medical comorbidities.  Recommend follow-up with neurology as outpatient for close monitoring of aneurysm.  For detailed assessment and plan, please refer to above as I have made changes wherever appropriate.   Neurology will sign off. Please call with questions. Pt will follow up with stroke clinic NP at [facility] in about 4 weeks. Thanks for the consult.   Marvel Plan, MD PhD Stroke Neurology 01/07/2021 5:08 PM  To contact Stroke Continuity provider, please refer to [email or web address] After hours, contact General Neurology

## 2021-01-07 NOTE — Progress Notes (Signed)
  Echocardiogram 2D Echocardiogram has been performed.  Gerda Diss 01/07/2021, 8:55 AM

## 2021-01-07 NOTE — Progress Notes (Addendum)
ANTICOAGULATION CONSULT NOTE - Initial Consult  Pharmacy Consult for apixaban  Indication: atrial fibrillation  No Known Allergies  Patient Measurements: Height: 5\' 6"  (167.6 cm) Weight: 77.5 kg (170 lb 13.7 oz) IBW/kg (Calculated) : 59.3  Vital Signs: Temp: 97.9 F (36.6 C) (05/29 0332) Temp Source: Oral (05/29 0332) BP: 148/62 (05/29 0332) Pulse Rate: 63 (05/29 0332)  Labs: Recent Labs    01/06/21 1230 01/06/21 1310  HGB  --  12.6  HCT  --  39.8  PLT  --  146*  LABPROT 12.9  --   INR 1.0  --   CREATININE 1.17*  --     Estimated Creatinine Clearance: 32.3 mL/min (A) (by C-G formula based on SCr of 1.17 mg/dL (H)).   Medical History: Past Medical History:  Diagnosis Date  . Anemia    vitamin b 12 deficiency anemia  . 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 Chronic venous insufficiency   . Depression   . Difficulty in walking   . Displaced trimalleolar fracture of left lower leg   . Essential (primary) hypertension   . GERD (gastroesophageal reflux disease)   . History of blood transfusion    02/2018   . History of falling   . Hyperlipidemia   . Hyperlipidemia   . Hypertension   . 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 Iron deficiency   . Major depressive disorder   . Muscle weakness (generalized)   . Myocardial infarction (HCC)    nonstemi mi - 02/19/2018 at [facility] after ankle fracture   . Nutritional deficiency, unspecified   . Obesity   . Other lack of coordination   . Unspecified atrial fibrillation (HCC)   . Unspecified osteoarthritis, unspecified site     Medications:   Assessment:  75 yof with a history of PAF not on anticoagulation due to falls who presented to the ED with right arm weakness and confusion. Neurology confirmed posterior left frontal CVA via MRI. Extensive discussion of risk vs  benefit of anticoagulation with neurology and will proceed with anticoagulation considering a CHA2DS2-VASc score of at least 7 including this embolic event. Patient's current weight is 77.5 kg and Scr elevated at 1.17. Discussed dosing with ordering provider. Initially believed lower dose for this patient would be appropriate for elevated fall risk. After discussion with Dr. 99, will increase to full dose of 5 MG BID with careful monitoring.   Goal of Therapy:  Monitor platelets by anticoagulation protocol: Yes   Plan:  Apixaban 5MG  BID  Monitor renal function, s/sx of bleed Counsel prior to discharge   Roda Shutters, PharmD, [facility] Resident [phone] 01/07/2021 2:58 PM

## 2021-01-07 NOTE — Evaluation (Signed)
Physical Therapy Evaluation Patient Details Name: Ana Franco MRN: [id number] DOB: [birth year] Today's Date: 01/07/2021   History of Present Illness  Ana Franco is a 87 y.o. admitted on 5/28 with R sided weakness found to have an acute ischemic nonhemorrhagic posterior left frontal  infarct in the left ACA distribution PMH: paroxysmal atrial fibrillation, CAD, hyperlipidemia, depression, s/p left BKA due to wound infection    Clinical Impression  Pt admitted with above. Pt was functioning at mod I level with use of RW and L prosthesis. Pt did require set up for bathing but otherwise was mod I. Pt now with R sided weakness and is requiring minA for safe transfers and mobility. Pt to benefit from CIR upon d/c to achieve safe mod I level of function. Pt demonstrates excellent rehab potential as pt required assistx2 yesterday and now only requires minAx1 today. Suspect pt will be able to achieve safe mod I level of function if undergo CIR program. Acute PT to cont to follow.    Follow Up Recommendations CIR    Equipment Recommendations  None recommended by PT    Recommendations for Other Services Rehab consult     Precautions / Restrictions Precautions Precautions: Fall Precaution Comments: L prosthesis Required Braces or Orthoses: Other Brace Other Brace: L prosthesis Restrictions Weight Bearing Restrictions: No      Mobility  Bed Mobility Overal bed mobility: Needs Assistance Bed Mobility: Rolling;Sidelying to Sit Rolling: Min assist Sidelying to sit: Min assist       General bed mobility comments: definite use of bed rail, labored effort, minA for trunk elevation, pt pulled up on PT, pt with hard time maintaining balance at EOB    Transfers Overall transfer level: Needs assistance Equipment used: Rolling walker (2 wheeled) Transfers: Sit to/from Stand Sit to Stand: Min assist         General transfer comment: minA to power up and steady during transition of  hands from bed to RW  Ambulation/Gait Ambulation/Gait assistance: Min assist Gait Distance (Feet): 30 Feet Assistive device: Rolling walker (2 wheeled) Gait Pattern/deviations: Step-through pattern;Decreased stride length Gait velocity: dec Gait velocity interpretation: <1.31 ft/sec, indicative of household ambulator General Gait Details: slow and guard, pt reporting "I don't want to fall again". Pt with good sequencing, minA for walker management, especially during turning  Stairs            Wheelchair Mobility    Modified Rankin (Stroke Patients Only)       Balance Overall balance assessment: Needs assistance Sitting-balance support: Feet supported;No upper extremity supported Sitting balance-Leahy Scale: Poor Sitting balance - Comments: posterior lean, minA to bring pt back to netural, pt with improved balance once L prosthetic on   Standing balance support: Bilateral upper extremity supported;During functional activity Standing balance-Leahy Scale: Fair Standing balance comment: dependent on RW                             Pertinent Vitals/Pain Pain Assessment: No/denies pain    Home Living Family/patient expects to be discharged to:: Private residence Living Arrangements: Children Available Help at Discharge: Family;Available PRN/intermittently Type of Home: House Home Access: Ramped entrance     Home Layout: One level Home Equipment: Walker - 2 wheels;Bedside commode;Wheelchair - manual      Prior Function Level of Independence: Independent with assistive device(s)         Comments: pt amb with RW, pt sponge bathes at sink while sitting  on commode with set up provided by son     Hand Dominance   Dominant Hand: Right    Extremity/Trunk Assessment   Upper Extremity Assessment Upper Extremity Assessment: RUE deficits/detail RUE Deficits / Details: grossly 4/5 except grip 3+/5    Lower Extremity Assessment Lower Extremity  Assessment: Generalized weakness    Cervical / Trunk Assessment Cervical / Trunk Assessment: Normal  Communication   Communication: No difficulties  Cognition Arousal/Alertness: Awake/alert Behavior During Therapy: WFL for tasks assessed/performed Overall Cognitive Status: Within Functional Limits for tasks assessed                                        General Comments General comments (skin integrity, edema, etc.): VSS    Exercises     Assessment/Plan    PT Assessment Patient needs continued PT services  PT Problem List Decreased strength;Decreased activity tolerance;Decreased range of motion;Decreased balance;Decreased mobility;Decreased coordination;Decreased knowledge of use of DME       PT Treatment Interventions DME instruction;Gait training;Stair training;Functional mobility training;Therapeutic activities;Therapeutic exercise;Balance training    PT Goals (Current goals can be found in the Care Plan section)  Acute Rehab PT Goals Patient Stated Goal: get better PT Goal Formulation: With patient Time For Goal Achievement: 01/14/21 Potential to Achieve Goals: Good    Frequency Min 4X/week   Barriers to discharge        Co-evaluation               AM-PAC PT "6 Clicks" Mobility  Outcome Measure Help needed turning from your back to your side while in a flat bed without using bedrails?: A Little Help needed moving from lying on your back to sitting on the side of a flat bed without using bedrails?: A Little Help needed moving to and from a bed to a chair (including a wheelchair)?: A Little Help needed standing up from a chair using your arms (e.g., wheelchair or bedside chair)?: A Little Help needed to walk in hospital room?: A Little Help needed climbing 3-5 steps with a railing? : A Lot 6 Click Score: 17    End of Session Equipment Utilized During Treatment: Gait belt Activity Tolerance: Patient tolerated treatment well Patient left:  in chair;with call bell/phone within reach;with chair alarm set Nurse Communication: Mobility status PT Visit Diagnosis: Unsteadiness on feet (R26.81);Other abnormalities of gait and mobility (R26.89);Muscle weakness (generalized) (M62.81);Difficulty in walking, not elsewhere classified (R26.2)    Time: 5809-9833 PT Time Calculation (min) (ACUTE ONLY): 25 min   Charges:   PT Evaluation $PT Eval Moderate Complexity: 1 Mod PT Treatments $Gait Training: 8-22 mins        Lewis Shock, PT, DPT Acute Rehabilitation Services Pager #: [phone] Office #: [phone]   Iona Hansen 01/07/2021, 8:55 AM

## 2021-01-07 NOTE — Evaluation (Signed)
Occupational Therapy Evaluation Patient Details Name: Ana Franco MRN: [id number] DOB: [birth year] Today's Date: 01/07/2021    History of Present Illness Ana Franco is a 87 y.o. admitted on 5/28 with R sided weakness found to have an acute ischemic nonhemorrhagic posterior left frontal  infarct in the left ACA distribution PMH: paroxysmal atrial fibrillation, CAD, hyperlipidemia, depression, s/p left BKA due to wound infection   Clinical Impression   This 87 yo female admitted and found to have above presents to acute OT with PLOF of being totally independent with basic ADLs and some IADLs. Currently pt is at a min A-setup/S level for basic ADLs. She will continue to benefit from acute OT with follow up on CIR to get back to her Mod I/RW level.    Follow Up Recommendations  CIR;Supervision - Intermittent    Equipment Recommendations  None recommended by OT       Precautions / Restrictions Precautions Precautions: Fall Precaution Comments: L prosthesis Required Braces or Orthoses: Other Brace Other Brace: L prosthesis Restrictions Weight Bearing Restrictions: No      Mobility Bed Mobility Overal bed mobility: Needs Assistance Bed Mobility: Supine to Sit Supine to sit: Min assist     General bed mobility comments: Use of bed rail, increased effort, min A to scoot forward to EOB    Transfers Overall transfer level: Needs assistance Equipment used: Rolling walker (2 wheeled) Transfers: Sit to/from Stand Sit to Stand: Min assist            Balance Overall balance assessment: Needs assistance Sitting-balance support: No upper extremity supported;Feet supported Sitting balance-Leahy Scale: Fair Sitting balance - Comments: posterior lean, minA to bring pt back to netural, pt with improved balance once L prosthetic on   Standing balance support: No upper extremity supported;During functional activity Standing balance-Leahy Scale: Fair Standing balance comment:  standing at sink to wash hands                           ADL either performed or assessed with clinical judgement   ADL Overall ADL's : Needs assistance/impaired   Eating/Feeding Details (indicate cue type and reason): NPO at time of eval, but if able to eat would be independent Grooming: Min guard;Standing;Wash/dry hands   Upper Body Bathing: Set up;Sitting   Lower Body Bathing: Min guard;Sit to/from stand   Upper Body Dressing : Set up;Sitting   Lower Body Dressing: Min guard;Sit to/from stand   Toilet Transfer: Min guard;Ambulation;RW;BSC Toilet Transfer Details (indicate cue type and reason): over toilet Toileting- Clothing Manipulation and Hygiene: Min guard;Sit to/from stand               Vision Baseline Vision/History: Wears glasses Patient Visual Report: No change from baseline Additional Comments: had a difficult time following directions for visual testing            Pertinent Vitals/Pain Pain Assessment: No/denies pain     Hand Dominance Right   Extremity/Trunk Assessment Upper Extremity Assessment Upper Extremity Assessment: Overall WFL for tasks assessed RUE Deficits / Details: grossly 4/5 except grip 3+/5     Communication Communication Communication: No difficulties   Cognition Arousal/Alertness: Awake/alert Behavior During Therapy: WFL for tasks assessed/performed Overall Cognitive Status: Within Functional Limits for tasks assessed  General Comments  VSS            Home Living Family/patient expects to be discharged to:: Inpatient rehab Living Arrangements: Children Available Help at Discharge: Family;Available PRN/intermittently Type of Home: House Home Access: Ramped entrance     Home Layout: One level     Bathroom Shower/Tub: Tub/shower unit         Home Equipment: Environmental consultant - 2 wheels;Bedside commode;Wheelchair - manual          Prior  Functioning/Environment Level of Independence: Independent with assistive device(s)        Comments: pt amb with RW, pt sponge bathes at sink while sitting on commode with set up provided by son        OT Problem List: Impaired balance (sitting and/or standing)      OT Treatment/Interventions: Self-care/ADL training;DME and/or AE instruction;Patient/family education;Balance training    OT Goals(Current goals can be found in the care plan section) Acute Rehab OT Goals Patient Stated Goal: to get back to doing for myself again OT Goal Formulation: With patient Time For Goal Achievement: 01/21/21 Potential to Achieve Goals: Good  OT Frequency: Min 2X/week              AM-PAC OT "6 Clicks" Daily Activity     Outcome Measure Help from another person eating meals?: None Help from another person taking care of personal grooming?: A Little Help from another person toileting, which includes using toliet, bedpan, or urinal?: A Little Help from another person bathing (including washing, rinsing, drying)?: A Little   Help from another person to put on and taking off regular lower body clothing?: A Little 6 Click Score: 16   End of Session Equipment Utilized During Treatment: Gait belt;Rolling walker (LLE prothesis)  Activity Tolerance: Patient tolerated treatment well Patient left: in chair;with call bell/phone within reach;with chair alarm set  OT Visit Diagnosis: Other abnormalities of gait and mobility (R26.89);Unsteadiness on feet (R26.81)                Time: 9622-2979 OT Time Calculation (min): 25 min Charges:  OT General Charges $OT Visit: 1 Visit OT Evaluation $OT Eval Moderate Complexity: 1 Mod OT Treatments $Self Care/Home Management : 8-22 mins  Ignacia Palma, OTR/L Acute [employer] Pager [phone] Office [phone]     Evette Georges 01/07/2021, 10:09 AM

## 2021-01-07 NOTE — TOC Initial Note (Signed)
Transition of Care [facility]) - Initial/Assessment Note    Patient Details  Name: Ana Franco MRN: [id number] Date of Birth: [birth year]  [facility]) CM/SW Contact:    Lawerance Sabal, RN Phone Number: 01/07/2021, 8:13 AM  Clinical Narrative:    Patient admitted with stroke. From home, shares home w son. Patient is normally able to ambulate with use of a walker with her prosthetic leg (LBKA 2019) and is normally able to bathe and dress herself without assistance.  Consult for SNF placement, PT OT evals pending.      PCP Gaspar Garbe, MD             Vitullo,Steve Son [phone]  [phone]  Shanafelt,Lynn Relative [phone]  Shasha, Buchbinder [phone]  [phone]        Expected Discharge Plan:  (to be determined) Barriers to Discharge: Continued Medical Work up   Patient Goals and CMS Choice        Expected Discharge Plan and Services Expected Discharge Plan:  (to be determined) In-house Referral: Clinical Social Work Discharge Planning Services: CM Consult   Living arrangements for the past 2 months: Single Family Home                                      Prior Living Arrangements/Services Living arrangements for the past 2 months: Single Family Home Lives with:: Adult Children                   Activities of Daily Living Home Assistive Devices/Equipment: Environmental consultant (specify type) ADL Screening (condition at time of admission) Patient's cognitive ability adequate to safely complete daily activities?: Yes Is the patient deaf or have difficulty hearing?: No Does the patient have difficulty seeing, even when wearing glasses/contacts?: No Does the patient have difficulty concentrating, remembering, or making decisions?: Yes Patient able to express need for assistance with ADLs?: Yes Does the patient have difficulty dressing or bathing?: Yes Independently performs ADLs?: Yes (appropriate for  developmental age) Does the patient have difficulty walking or climbing stairs?: Yes Weakness of Legs: Both Weakness of Arms/Hands: None  Permission Sought/Granted                  Emotional Assessment              Admission diagnosis:  Aphasia [R47.01] TIA (transient ischemic attack) [G45.9] Transient weakness of right lower extremity [R29.898] Transient speech disturbance [R47.9] Atrial flutter, unspecified type [facility]) [I48.92] Patient Active Problem List   Diagnosis Date Noted  . Thrombocytopenia (HCC) 01/06/2021  . TIA (transient ischemic attack) 01/06/2021  . CKD (chronic kidney disease), stage III (HCC) 01/06/2021  . DNR (do not resuscitate) 01/06/2021  . Paroxysmal atrial fibrillation (HCC) 05/04/2018  . Gangrene of lower extremity (HCC)   . Subacute osteomyelitis, left ankle and foot (HCC)   . S/P ORIF (open reduction internal fixation) fracture 04/03/2018  . Wound infection 04/02/2018  . MRSA (methicillin resistant staph aureus) culture positive 04/02/2018  . Acute blood loss anemia 04/02/2018  . Closed left ankle fracture, with delayed healing, subsequent encounter 03/31/2018  . Essential hypertension, benign 06/23/2014  . Dyslipidemia 06/23/2014  . GERD (gastroesophageal reflux disease) 06/23/2014  . UTI (urinary tract infection) 06/23/2014  . Depression 06/23/2014   PCP:  Gaspar Garbe, MD Pharmacy:   [facility] - [place], [place] - [address] [facility]  [place] [address] Phone: [phone] Fax: [phone]     Social Determinants of Health (SDOH) Interventions    Readmission Risk Interventions No flowsheet data found.

## 2021-01-07 NOTE — Plan of Care (Signed)
  Problem: Education: Goal: Knowledge of General Education information will improve Description Including pain rating scale, medication(s)/side effects and non-pharmacologic comfort measures Outcome: Progressing   

## 2021-01-07 NOTE — Progress Notes (Signed)
Inpatient Rehab Admissions Coordinator Note:   Per therapy recommendations, pt was screened for CIR candidacy by Megan Salon, MS CCC-SLP. At this time, Pt. Appears to have functional decline and is a potential candidate for CIR. She is currently observation status and will need to be admitted as inpatient to be considered for CIR. MD may place CIR consult order if status changed. I will also check back later today for change in status and place consult order if Pt. Is admitted. Please contact me with questions.   Megan Salon, MS, CCC-SLP Rehab Admissions Coordinator  [phone] (celll) [phone] (office)

## 2021-01-08 DIAGNOSIS — N1832 Chronic kidney disease, stage 3b: Secondary | ICD-10-CM | POA: Diagnosis not present

## 2021-01-08 DIAGNOSIS — Z66 Do not resuscitate: Secondary | ICD-10-CM | POA: Diagnosis not present

## 2021-01-08 DIAGNOSIS — E785 Hyperlipidemia, unspecified: Secondary | ICD-10-CM | POA: Diagnosis not present

## 2021-01-08 DIAGNOSIS — F32A Depression, unspecified: Secondary | ICD-10-CM | POA: Diagnosis not present

## 2021-01-08 LAB — CBC
HCT: 35.7 % — ABNORMAL LOW (ref 36.0–46.0)
Hemoglobin: 11.8 g/dL — ABNORMAL LOW (ref 12.0–15.0)
MCH: 30.5 pg (ref 26.0–34.0)
MCHC: 33.1 g/dL (ref 30.0–36.0)
MCV: 92.2 fL (ref 80.0–100.0)
Platelets: 129 10*3/uL — ABNORMAL LOW (ref 150–400)
RBC: 3.87 MIL/uL (ref 3.87–5.11)
RDW: 13.2 % (ref 11.5–15.5)
WBC: 5.7 10*3/uL (ref 4.0–10.5)
nRBC: 0 % (ref 0.0–0.2)

## 2021-01-08 LAB — BASIC METABOLIC PANEL
Anion gap: 8 (ref 5–15)
BUN: 25 mg/dL — ABNORMAL HIGH (ref 8–23)
CO2: 25 mmol/L (ref 22–32)
Calcium: 8.9 mg/dL (ref 8.9–10.3)
Chloride: 103 mmol/L (ref 98–111)
Creatinine, Ser: 1.13 mg/dL — ABNORMAL HIGH (ref 0.44–1.00)
GFR, Estimated: 46 mL/min — ABNORMAL LOW (ref >60)
Glucose, Bld: 101 mg/dL — ABNORMAL HIGH (ref 70–99)
Potassium: 3.6 mmol/L (ref 3.5–5.1)
Sodium: 136 mmol/L (ref 135–145)

## 2021-01-08 NOTE — Progress Notes (Signed)
Inpatient Rehab Admissions Coordinator:   Met with patient at bedside to discuss potential CIR admission and spoke with her son over the phone. They state that the would prefer [facility] for pt.'s rehab and declined CIR at this time. I will notify TOC and CIR will sign off.   Clemens Catholic, [place], [place] Admissions Coordinator  [phone] ([place]) [phone] (office)

## 2021-01-08 NOTE — Plan of Care (Signed)
  Problem: Nutrition: Goal: Adequate nutrition will be maintained Outcome: Progressing   Problem: Coping: Goal: Level of anxiety will decrease Outcome: Progressing   Problem: Elimination: Goal: Will not experience complications related to bowel motility Outcome: Progressing   Problem: Elimination: Goal: Will not experience complications related to urinary retention Outcome: Progressing

## 2021-01-08 NOTE — Progress Notes (Signed)
PROGRESS NOTE  Ana Franco  JXB:[id number] DOB: 09/18/1925 DOA: 01/06/2021 PCP: Gaspar Garbeisovec, Richard W, MD   Brief Narrative: Ana SanesDorothy Franco is a 87 y.o. female with a history of PAF not on anticoagulation due to falls, CAD, HLD, LLE infection s/p left transtibial amputation, and depression who presented to the ED by EMS when she noticed right arm weakness and son reported speech difficulty and confusion. Vital signs were stable, labs significant for platelet count of 146k, SCr 1.17. Neurology was consulted and MRI did confirm acute posterior left frontal ischemic CVA in distribution consistent with embolic stroke due to atrial fibrillation. Aspirin has been converted to eliquis after extensive discussions of risks and benefits of anticoagulation. Further work up is underway, and stroke neurology is following. CIR admission for rehabilitation is also being considered.   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 Left ACA infarct on MRI, MRA head/neck 40-50% right ICA bulb stenosis, 5mm left cavernous ICA aneurysm. LVEF 60-65%, LDL 44, HbA1c still pending.  - Eliquis to replace aspirin. Continue statin/zetia.  - Continue PT/OT at SNF.  - Follow up as as outpatient [facility] in about 4 weeks. Pt not a candidate for intervention on cavernous ICA aneurysm found on MRA due to comorbidities, age, and size per neurology.  PAF:  - CHA2DS2-VASc score is at least 7 inclusive of this embolic stroke. Started eliquis 5mg  po BID on 5/29. Consideration for dose reduction (despite SCr being <1.5 and weight >60kg), though neurology  felt this would still present risk of bleeding without providing adequate protection from recurrent stroke. - Continue metoprolol for rate control  Stage IIIb CKD:  - Avoid nephrotoxins. Risk of DOAC felt to be lower than risks associated with coumadin.   HTN:  - Permissive HTN, holding norvasc.   Thrombocytopenia: Did have self-limited nose bleed several weeks ago when asked, no other bleeding noted, none currently.  - Monitor CBC in AM again with initiation of anticoagulation. Aspirin has been stopped.   HLD: LDL is 44 - Continue vytorin with no hemorrhage on neuroimaging.   s/p transtibial amputation: For infection.  - Continue PT  Depression:  - Continue SSRI   DVT prophylaxis: Eliquis Code Status: DNR, POA Family Communication: None at bedside Disposition Plan:  Status is: Inpatient  Remains inpatient appropriate because:Ongoing diagnostic testing needed not appropriate for outpatient work up  Dispo: The patient is from: Home              Anticipated d/c is to: SNF - family declines CIR              Patient currently is medically stable to d/c.   Difficult to place patient No  Consultants:   Neurology  Procedures:   Echocardiogram  Antimicrobials:  None   Subjective: No new weakness/numbness, amenable to working with PT. No bleeding since starting eliquis. Understands rationale and inherent risks of blood thinner in her unique clinical situation.  Objective: Vitals:   01/07/21 0332 01/07/21 1507 01/07/21 2028 01/08/21 0534  BP: (!) 148/62 (!) 127/55 (!) 141/56 (!) 153/72  Pulse: 63 (!) 48 66 (!) 55  Resp: 18 17  20 20  Temp: 97.9 F (36.6 C) 98.3 F (36.8 C) 97.8 F (36.6 C) 98.2 F (36.8 C)  TempSrc: Oral     SpO2: 92% 96% 97% 94%  Weight:      Height:        Intake/Output Summary (Last 24 hours) at 01/08/2021 1122 Last data filed at 01/08/2021 0500 Gross per 24 hour  Intake --  Output 600 ml  Net -600 ml   Filed Weights   01/06/21 1209  01/06/21 2200  Weight: 56 kg 77.5 kg   Gen: 87 y.o. female in no distress Pulm: Nonlabored breathing room air. Clear. CV: Irreg without murmur, rub, or gallop. No JVD, no dependent edema. GI: Abdomen soft, non-tender, non-distended, with normoactive bowel sounds.  Ext: Warm, left TTA stable Skin: No new rashes, lesions or ulcers on visualized skin. No bleeding noted. Neuro: Alert and oriented. No significant focal neurological deficits, other than perhaps some clumsiness of R hand. Psych: Judgement and insight appear fair. Mood euthymic & affect congruent. Behavior is appropriate.    Data Reviewed: I have personally reviewed following labs and imaging studies  CBC: Recent Labs  Lab 01/06/21 1310 01/08/21 0122  WBC 7.8 5.7  NEUTROABS 5.9  --   HGB 12.6 11.8*  HCT 39.8 35.7*  MCV 95.7 92.2  PLT 146* 129*   Basic Metabolic Panel: Recent Labs  Lab 01/06/21 1230 01/08/21 0122  NA 135 136  K 4.2 3.6  CL 102 103  CO2 24 25  GLUCOSE 145* 101*  BUN 29* 25*  CREATININE 1.17* 1.13*  CALCIUM 8.9 8.9  MG 2.0  --    GFR: Estimated Creatinine Clearance: 33.4 mL/min (A) (by C-G formula based on SCr of 1.13 mg/dL (H)). Liver Function Tests: Recent Labs  Lab 01/06/21 1230  AST 28  ALT 17  ALKPHOS 77  BILITOT 0.6  PROT 6.3*  ALBUMIN 3.5   No results for input(s): LIPASE, AMYLASE in the last 168 hours. No results for input(s): AMMONIA in the last 168 hours. Coagulation Profile: Recent Labs  Lab 01/06/21 1230  INR 1.0   Cardiac Enzymes: No results for input(s): CKTOTAL, CKMB, CKMBINDEX, TROPONINI in the last 168 hours. BNP (last 3 results) No results for input(s): PROBNP in the last 8760 hours. HbA1C: No results for input(s): HGBA1C in the last 72 hours. CBG: No results for input(s): GLUCAP in the last 168 hours. Lipid Profile: Recent Labs    01/07/21 0126  CHOL 95  HDL 41  LDLCALC 44  TRIG 52  CHOLHDL 2.3   Thyroid Function Tests: Recent Labs     01/06/21 1230  TSH 1.672   Anemia Panel: No results for input(s): VITAMINB12, FOLATE, FERRITIN, TIBC, IRON, RETICCTPCT in the last 72 hours. Urine analysis:    Component Value Date/Time   COLORURINE YELLOW 06/13/2014 0018   APPEARANCEUR CLOUDY (A) 06/13/2014 0018   LABSPEC 1.018 06/13/2014 0018   PHURINE 5.0 06/13/2014 0018   GLUCOSEU NEGATIVE 06/13/2014 0018   HGBUR MODERATE (A) 06/13/2014 0018   BILIRUBINUR NEGATIVE 06/13/2014 0018   KETONESUR NEGATIVE 06/13/2014 0018   PROTEINUR NEGATIVE 06/13/2014 0018   UROBILINOGEN 0.2 06/13/2014 0018   NITRITE POSITIVE (A) 06/13/2014 0018   LEUKOCYTESUR LARGE (A) 06/13/2014 0018   Recent Results (from the past 240 hour(s))  Resp Panel by RT-PCR (Flu A&B, Covid) Nasopharyngeal Swab     Status: None   Collection Time: 01/06/21 12:32 PM   Specimen: Nasopharyngeal Swab; Nasopharyngeal(NP) swabs in vial transport medium  Result Value Ref Range Status   SARS Coronavirus 2 by RT PCR NEGATIVE NEGATIVE Final    Comment: (NOTE) SARS-CoV-2 target nucleic acids are NOT DETECTED.  The SARS-CoV-2 RNA is generally detectable in upper respiratory specimens during the acute phase of infection. The lowest concentration of SARS-CoV-2 viral copies this assay can detect is 138 copies/mL. A negative result does not preclude SARS-Cov-2 infection and should not be used as the sole basis for treatment or other patient management decisions. A negative result may occur with  improper specimen collection/handling, submission of specimen other than nasopharyngeal swab, presence of viral mutation(s) within the areas targeted by this assay, and inadequate number of viral copies(<138 copies/mL). A negative result must be combined with clinical observations, patient history, and epidemiological information. The expected result is Negative.  Fact Sheet for Patients:  BloggerCourse.com  Fact Sheet for Healthcare Providers:   SeriousBroker.it  This test is no t yet approved or cleared by the [place] FDA and  has been authorized for detection and/or diagnosis of SARS-CoV-2 by FDA under an Emergency Use Authorization (EUA). This EUA will remain  in effect (meaning this test can be used) for the duration of the COVID-19 declaration under Section 564(b)(1) of the Act, 21 U.S.C.section 360bbb-3(b)(1), unless the authorization is terminated  or revoked sooner.       Influenza A by PCR NEGATIVE NEGATIVE Final   Influenza B by PCR NEGATIVE NEGATIVE Final    Comment: (NOTE) The Xpert Xpress SARS-CoV-2/FLU/RSV plus assay is intended as an aid in the diagnosis of influenza from Nasopharyngeal swab specimens and should not be used as a sole basis for treatment. Nasal washings and aspirates are unacceptable for Xpert Xpress SARS-CoV-2/FLU/RSV testing.  Fact Sheet for Patients: BloggerCourse.com  Fact Sheet for Healthcare Providers: SeriousBroker.it  This test is not yet approved or cleared by the [place] FDA and has been authorized for detection and/or diagnosis of SARS-CoV-2 by FDA under an Emergency Use Authorization (EUA). This EUA will remain in effect (meaning this test can be used) for the duration of the COVID-19 declaration under Section 564(b)(1) of the Act, 21 U.S.C. section 360bbb-3(b)(1), unless the authorization is terminated or revoked.  Performed at [facility], [address], [place], [place] [address]       Radiology Studies: MR ANGIO HEAD WO CONTRAST  Result Date: 01/06/2021 CLINICAL DATA:  Initial evaluation for acute TIA, right-sided weakness./ EXAM: MRI HEAD WITHOUT CONTRAST MRA HEAD WITHOUT CONTRAST MRA NECK WITHOUT AND WITH CONTRAST TECHNIQUE: Multiplanar, multi-echo pulse sequences of the brain and surrounding structures were acquired without intravenous contrast. Angiographic images of the  Circle of Willis were acquired using MRA technique without intravenous contrast. Angiographic images of the neck were acquired using MRA technique without and with intravenous contrast. Carotid stenosis measurements (when applicable) are obtained utilizing NASCET criteria, using the distal internal carotid diameter as the denominator. CONTRAST:  60mL GADAVIST GADOBUTROL 1 MMOL/ML IV SOLN COMPARISON:  None available. FINDINGS: MRI HEAD FINDINGS Brain: Diffuse prominence of the CSF containing spaces compatible generalized age-related cerebral atrophy. Patchy T2/FLAIR hyperintensity within the periventricular and deep white matter both cerebral hemispheres most consistent with chronic small vessel ischemic disease, mild for age. There is an approximate 1.9 cm curvilinear focus of restricted diffusion involving the cortical and subcortical aspect of the parasagittal posterior left frontal lobe (series 5, images 93-89, consistent with an acute ischemic infarct, left ACA distribution. Additional punctate focus of restricted diffusion also noted posteriorly at the parasagittal  left parietal lobe (series 5, image 87). No associated hemorrhage or mass effect. No other evidence for acute or subacute ischemia. Gray-white matter differentiation otherwise maintained. No encephalomalacia to suggest chronic cortical infarction elsewhere within the brain. No foci of susceptibility artifact to suggest acute or chronic intracranial hemorrhage. Note made of a 6 mm rounded T2 hypointense lesion at the left temporal convexity, likely a small excrescence related to the adjacent left temporal bone. No other mass lesion, midline shift or mass effect. Diffuse ventricular prominence related global parenchymal volume loss without hydrocephalus. Pituitary gland suprasellar region normal. Midline structures intact. Vascular: Major intracranial vascular flow voids are maintained. Skull and upper cervical spine: Prominent degenerative thickening  of the tectorial membrane and pannus formation noted about the dens with secondary mild narrowing at the craniocervical junction. Bone marrow signal intensity within normal limits. No scalp soft tissue abnormality. Sinuses/Orbits: Patient status post ocular lens replacement. Globes and orbital soft tissues demonstrate no acute finding. Left-to-right nasal septal deviation with associated concha bullosa. Paranasal sinuses are clear. No mastoid effusion. Inner ear structures grossly normal. Other: None. MRA HEAD FINDINGS ANTERIOR CIRCULATION: Visualized distal cervical segments of the internal carotid arteries are patent with antegrade flow. Petrous segments widely patent. Scattered atheromatous irregularity throughout the carotid siphons without flow-limiting stenosis. Cavernous left ICA irregular and somewhat diffusely ectatic. Superimposed 5 mm aneurysm seen projecting medially from the cavernous left ICA. This is better seen on corresponding MRA portion of this study (series 1069, image 7). Left A1 segment widely patent. Right A1 hypoplastic and/or absent, accounting for the diminutive right ICA is compared to the left. Normal anterior communicating artery complex. Visualized ACAs widely patent to their distal aspects. No M1 stenosis or occlusion. Normal MCA bifurcations. MCA branches well perfused and symmetric. Diffuse small vessel atheromatous irregularity. POSTERIOR CIRCULATION: Dominant left V4 segment widely patent to the vertebrobasilar junction. Diminutive right vertebral artery largely terminates in PICA, although a tiny branch ascending towards the vertebrobasilar junction. Right PICA patent. Left PICA not seen. Basilar patent to its distal aspect without stenosis. Superior cerebellar arteries patent bilaterally. Both PCAs primarily supplied via the basilar well perfused to their distal aspects. MRA NECK FINDINGS AORTIC ARCH: Visualized aortic arch within normal limits for caliber with normal branch  pattern. No hemodynamically significant stenosis seen about the origin of the great vessels. RIGHT CAROTID SYSTEM: Right CCA tortuous but patent to its distal aspect without stenosis. Atheromatous irregularity about the right carotid bulb with estimated stenosis of 40-50% by NASCET criteria (series 1063, image 5). Right ICA tortuous but otherwise patent distally to the skull base without stenosis, evidence for dissection, or occlusion. LEFT CAROTID SYSTEM: Left CCA tortuous but is widely patent to the bifurcation without stenosis. Mild atheromatous irregularity about the left carotid bulb without significant stenosis. Left ICA tortuous but otherwise patent to the skull base without stenosis, evidence for dissection or occlusion. VERTEBRAL ARTERIES: Both vertebral arteries arise from subclavian arteries. Left vertebral artery dominant, with a diffusely hypoplastic right vertebral artery. No proximal subclavian artery stenosis. 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 Vertebral arteries tortuous but otherwise patent without stenosis, evidence for dissection or occlusion. IMPRESSION: MRI HEAD IMPRESSION: 1. 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 2. Underlying age-related cerebral atrophy with mild chronic small vessel ischemic disease. MRA HEAD IMPRESSION: 1. Negative intracranial MRA for large vessel occlusion. 2. Diffuse small vessel atheromatous irregularity throughout the intracranial circulation. No proximal high-grade or correctable stenosis.  3. 5 mm aneurysm projecting medially from the cavernous left ICA. This is better appreciated on corresponding MRA neck portion of this exam. MRA NECK IMPRESSION: 1. 40-50% atheromatous stenosis about the right carotid bulb. Otherwise wide patency of both carotid artery systems within the neck. 2. 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 Vertebral arteries  otherwise widely patent within the neck. 3. Diffuse tortuosity of the major arterial vasculature of the neck, suggesting chronic underlying hypertension. Electronically Signed   By: Rise Mu M.D.   On: 01/06/2021 20:37   MR Angiogram Neck W or Wo Contrast  Result Date: 01/06/2021 CLINICAL DATA:  Initial evaluation for acute TIA, right-sided weakness./ EXAM: MRI HEAD WITHOUT CONTRAST MRA HEAD WITHOUT CONTRAST MRA NECK WITHOUT AND WITH CONTRAST TECHNIQUE: Multiplanar, multi-echo pulse sequences of the brain and surrounding structures were acquired without intravenous contrast. Angiographic images of the Circle of Willis were acquired using MRA technique without intravenous contrast. Angiographic images of the neck were acquired using MRA technique without and with intravenous contrast. Carotid stenosis measurements (when applicable) are obtained utilizing NASCET criteria, using the distal internal carotid diameter as the denominator. CONTRAST:  81mL GADAVIST GADOBUTROL 1 MMOL/ML IV SOLN COMPARISON:  None available. FINDINGS: MRI HEAD FINDINGS Brain: Diffuse prominence of the CSF containing spaces compatible generalized age-related cerebral atrophy. Patchy T2/FLAIR hyperintensity within the periventricular and deep white matter both cerebral hemispheres most consistent with chronic small vessel ischemic disease, mild for age. There is an approximate 1.9 cm curvilinear focus of restricted diffusion involving the cortical and subcortical aspect of the parasagittal posterior left frontal lobe (series 5, images 93-89, consistent with an acute ischemic infarct, left ACA distribution. Additional punctate focus of restricted diffusion also noted posteriorly at the parasagittal left parietal lobe (series 5, image 87). No associated hemorrhage or mass effect. No other evidence for acute or subacute ischemia. Gray-white matter differentiation otherwise maintained. No encephalomalacia to suggest chronic cortical  infarction elsewhere within the brain. No foci of susceptibility artifact to suggest acute or chronic intracranial hemorrhage. Note made of a 6 mm rounded T2 hypointense lesion at the left temporal convexity, likely a small excrescence related to the adjacent left temporal bone. No other mass lesion, midline shift or mass effect. Diffuse ventricular prominence related global parenchymal volume loss without hydrocephalus. Pituitary gland suprasellar region normal. Midline structures intact. Vascular: Major intracranial vascular flow voids are maintained. Skull and upper cervical spine: Prominent degenerative thickening of the tectorial membrane and pannus formation noted about the dens with secondary mild narrowing at the craniocervical junction. Bone marrow signal intensity within normal limits. No scalp soft tissue abnormality. Sinuses/Orbits: Patient status post ocular lens replacement. Globes and orbital soft tissues demonstrate no acute finding. Left-to-right nasal septal deviation with associated concha bullosa. Paranasal sinuses are clear. No mastoid effusion. Inner ear structures grossly normal. Other: None. MRA HEAD FINDINGS ANTERIOR CIRCULATION: Visualized distal cervical segments of the internal carotid arteries are patent with antegrade flow. Petrous segments widely patent. Scattered atheromatous irregularity throughout the carotid siphons without flow-limiting stenosis. Cavernous left ICA irregular and somewhat diffusely ectatic. Superimposed 5 mm aneurysm seen projecting medially from the cavernous left ICA. This is better seen on corresponding MRA portion of this study (series 1069, image 7). Left A1 segment widely patent. Right A1 hypoplastic and/or absent, accounting for the diminutive right ICA is compared to the left. Normal anterior communicating artery complex. Visualized ACAs widely patent to their distal aspects. No M1 stenosis or occlusion. Normal MCA bifurcations.  MCA branches well perfused  and symmetric. Diffuse small vessel atheromatous irregularity. POSTERIOR CIRCULATION: Dominant left V4 segment widely patent to the vertebrobasilar junction. Diminutive right vertebral artery largely terminates in PICA, although a tiny branch ascending towards the vertebrobasilar junction. Right PICA patent. Left PICA not seen. Basilar patent to its distal aspect without stenosis. Superior cerebellar arteries patent bilaterally. Both PCAs primarily supplied via the basilar well perfused to their distal aspects. MRA NECK FINDINGS AORTIC ARCH: Visualized aortic arch within normal limits for caliber with normal branch pattern. No hemodynamically significant stenosis seen about the origin of the great vessels. RIGHT CAROTID SYSTEM: Right CCA tortuous but patent to its distal aspect without stenosis. Atheromatous irregularity about the right carotid bulb with estimated stenosis of 40-50% by NASCET criteria (series 1063, image 5). Right ICA tortuous but otherwise patent distally to the skull base without stenosis, evidence for dissection, or occlusion. LEFT CAROTID SYSTEM: Left CCA tortuous but is widely patent to the bifurcation without stenosis. Mild atheromatous irregularity about the left carotid bulb without significant stenosis. Left ICA tortuous but otherwise patent to the skull base without stenosis, evidence for dissection or occlusion. VERTEBRAL ARTERIES: Both vertebral arteries arise from subclavian arteries. Left vertebral artery dominant, with a diffusely hypoplastic right vertebral artery. No proximal subclavian artery stenosis. 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 Vertebral arteries tortuous but otherwise patent without stenosis, evidence for dissection or occlusion. IMPRESSION: MRI HEAD IMPRESSION: 1. 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 2. Underlying age-related cerebral atrophy with mild chronic small vessel  ischemic disease. MRA HEAD IMPRESSION: 1. Negative intracranial MRA for large vessel occlusion. 2. Diffuse small vessel atheromatous irregularity throughout the intracranial circulation. No proximal high-grade or correctable stenosis. 3. 5 mm aneurysm projecting medially from the cavernous left ICA. This is better appreciated on corresponding MRA neck portion of this exam. MRA NECK IMPRESSION: 1. 40-50% atheromatous stenosis about the right carotid bulb. Otherwise wide patency of both carotid artery systems within the neck. 2. 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 Vertebral arteries otherwise widely patent within the neck. 3. Diffuse tortuosity of the major arterial vasculature of the neck, suggesting chronic underlying hypertension. Electronically Signed   By: Rise Mu M.D.   On: 01/06/2021 20:37   MR BRAIN WO CONTRAST  Result Date: 01/06/2021 CLINICAL DATA:  Initial evaluation for acute TIA, right-sided weakness./ EXAM: MRI HEAD WITHOUT CONTRAST MRA HEAD WITHOUT CONTRAST MRA NECK WITHOUT AND WITH CONTRAST TECHNIQUE: Multiplanar, multi-echo pulse sequences of the brain and surrounding structures were acquired without intravenous contrast. Angiographic images of the Circle of Willis were acquired using MRA technique without intravenous contrast. Angiographic images of the neck were acquired using MRA technique without and with intravenous contrast. Carotid stenosis measurements (when applicable) are obtained utilizing NASCET criteria, using the distal internal carotid diameter as the denominator. CONTRAST:  5mL GADAVIST GADOBUTROL 1 MMOL/ML IV SOLN COMPARISON:  None available. FINDINGS: MRI HEAD FINDINGS Brain: Diffuse prominence of the CSF containing spaces compatible generalized age-related cerebral atrophy. Patchy T2/FLAIR hyperintensity within the periventricular and deep white matter both cerebral hemispheres most consistent with chronic small vessel ischemic  disease, mild for age. There is an approximate 1.9 cm curvilinear focus of restricted diffusion involving the cortical and subcortical aspect of the parasagittal posterior left frontal lobe (series 5, images 93-89, consistent with an acute ischemic infarct, left ACA distribution. Additional punctate focus of restricted diffusion also noted posteriorly at  the parasagittal left parietal lobe (series 5, image 87). No associated hemorrhage or mass effect. No other evidence for acute or subacute ischemia. Gray-white matter differentiation otherwise maintained. No encephalomalacia to suggest chronic cortical infarction elsewhere within the brain. No foci of susceptibility artifact to suggest acute or chronic intracranial hemorrhage. Note made of a 6 mm rounded T2 hypointense lesion at the left temporal convexity, likely a small excrescence related to the adjacent left temporal bone. No other mass lesion, midline shift or mass effect. Diffuse ventricular prominence related global parenchymal volume loss without hydrocephalus. Pituitary gland suprasellar region normal. Midline structures intact. Vascular: Major intracranial vascular flow voids are maintained. Skull and upper cervical spine: Prominent degenerative thickening of the tectorial membrane and pannus formation noted about the dens with secondary mild narrowing at the craniocervical junction. Bone marrow signal intensity within normal limits. No scalp soft tissue abnormality. Sinuses/Orbits: Patient status post ocular lens replacement. Globes and orbital soft tissues demonstrate no acute finding. Left-to-right nasal septal deviation with associated concha bullosa. Paranasal sinuses are clear. No mastoid effusion. Inner ear structures grossly normal. Other: None. MRA HEAD FINDINGS ANTERIOR CIRCULATION: Visualized distal cervical segments of the internal carotid arteries are patent with antegrade flow. Petrous segments widely patent. Scattered atheromatous  irregularity throughout the carotid siphons without flow-limiting stenosis. Cavernous left ICA irregular and somewhat diffusely ectatic. Superimposed 5 mm aneurysm seen projecting medially from the cavernous left ICA. This is better seen on corresponding MRA portion of this study (series 1069, image 7). Left A1 segment widely patent. Right A1 hypoplastic and/or absent, accounting for the diminutive right ICA is compared to the left. Normal anterior communicating artery complex. Visualized ACAs widely patent to their distal aspects. No M1 stenosis or occlusion. Normal MCA bifurcations. MCA branches well perfused and symmetric. Diffuse small vessel atheromatous irregularity. POSTERIOR CIRCULATION: Dominant left V4 segment widely patent to the vertebrobasilar junction. Diminutive right vertebral artery largely terminates in PICA, although a tiny branch ascending towards the vertebrobasilar junction. Right PICA patent. Left PICA not seen. Basilar patent to its distal aspect without stenosis. Superior cerebellar arteries patent bilaterally. Both PCAs primarily supplied via the basilar well perfused to their distal aspects. MRA NECK FINDINGS AORTIC ARCH: Visualized aortic arch within normal limits for caliber with normal branch pattern. No hemodynamically significant stenosis seen about the origin of the great vessels. RIGHT CAROTID SYSTEM: Right CCA tortuous but patent to its distal aspect without stenosis. Atheromatous irregularity about the right carotid bulb with estimated stenosis of 40-50% by NASCET criteria (series 1063, image 5). Right ICA tortuous but otherwise patent distally to the skull base without stenosis, evidence for dissection, or occlusion. LEFT CAROTID SYSTEM: Left CCA tortuous but is widely patent to the bifurcation without stenosis. Mild atheromatous irregularity about the left carotid bulb without significant stenosis. Left ICA tortuous but otherwise patent to the skull base without stenosis,  evidence for dissection or occlusion. VERTEBRAL ARTERIES: Both vertebral arteries arise from subclavian arteries. Left vertebral artery dominant, with a diffusely hypoplastic right vertebral artery. No proximal subclavian artery stenosis. 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 Vertebral arteries tortuous but otherwise patent without stenosis, evidence for dissection or occlusion. IMPRESSION: MRI HEAD IMPRESSION: 1. 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 2. Underlying age-related cerebral atrophy with mild chronic small vessel ischemic disease. MRA HEAD IMPRESSION: 1. Negative intracranial MRA for large vessel occlusion. 2. Diffuse small vessel atheromatous irregularity throughout the intracranial circulation. No proximal high-grade or  correctable stenosis. 3. 5 mm aneurysm projecting medially from the cavernous left ICA. This is better appreciated on corresponding MRA neck portion of this exam. MRA NECK IMPRESSION: 1. 40-50% atheromatous stenosis about the right carotid bulb. Otherwise wide patency of both carotid artery systems within the neck. 2. 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 Vertebral arteries otherwise widely patent within the neck. 3. Diffuse tortuosity of the major arterial vasculature of the neck, suggesting chronic underlying hypertension. Electronically Signed   By: Rise Mu M.D.   On: 01/06/2021 20:37   DG Chest Portable 1 View  Result Date: 01/06/2021 CLINICAL DATA:  weakness EXAM: PORTABLE CHEST - 1 VIEW COMPARISON:  06/12/2014 FINDINGS: The mediastinal contours are within normal limits. Stable mild cardiomegaly. The lungs are clear bilaterally without evidence of focal consolidation, pleural effusion, or pneumothorax. Atherosclerotic calcifications of the thoracic aorta. No acute osseous abnormality. IMPRESSION: 1. No acute cardiopulmonary process. 2.  Stable cardiomegaly. 3.  Aortic Atherosclerosis (ICD10-I70.0). Electronically Signed   By: Marliss Coots MD   On: 01/06/2021 12:47   ECHOCARDIOGRAM COMPLETE  Result Date: 01/07/2021    ECHOCARDIOGRAM REPORT   Patient Name:   Ana Franco Date of Exam: 01/07/2021 Medical Rec #:  [id number]      Height:       66.0 in Accession #:    [id number]     Weight:       170.9 lb Date of Birth:  07/04/1926      BSA:          1.870 m Patient Age:    87 years       BP:           148/62 mmHg Patient Gender: F              HR:           63 bpm. Exam Location:  Inpatient Procedure: 2D Echo, Cardiac Doppler and Color Doppler Indications:    TIA  History:        Patient has no prior history of Echocardiogram examinations. CAD                 and Previous Myocardial Infarction, Arrythmias:Atrial                 Fibrillation; Risk Factors:Hypertension. GERD.  Sonographer:    Ross Ludwig RDCS (AE) Referring Phys: [id number] RONDELL A SMITH IMPRESSIONS  1. Left ventricular ejection fraction, by estimation, is 60 to 65%. The left ventricle has normal function. The left ventricle has no regional wall motion abnormalities. There is moderate left ventricular hypertrophy. Left ventricular diastolic function  could not be evaluated. Elevated left atrial pressure.  2. Right ventricular systolic function is normal. The right ventricular size is normal. There is normal pulmonary artery systolic pressure.  3. Left atrial size was severely dilated.  4. The mitral valve is normal in structure. Mild mitral valve regurgitation. No evidence of mitral stenosis. Moderate mitral annular calcification.  5. The aortic valve is tricuspid. Aortic valve regurgitation is mild. Mild aortic valve sclerosis is present, with no evidence of aortic valve stenosis.  6. The inferior vena cava is dilated in size with >50% respiratory variability, suggesting right atrial pressure of 8 mmHg. FINDINGS  Left Ventricle: Left ventricular ejection fraction, by estimation, is 60  to 65%. The left ventricle has normal function. The left ventricle has no regional wall motion abnormalities. The left ventricular internal cavity size was normal in size.  There is  moderate left ventricular hypertrophy. Left ventricular diastolic function could not be evaluated due to atrial fibrillation. Left ventricular diastolic function could not be evaluated. Elevated left atrial pressure. Right Ventricle: The right ventricular size is normal.Right ventricular systolic function is normal. There is normal pulmonary artery systolic pressure. The tricuspid regurgitant velocity is 2.56 m/s, and with an assumed right atrial pressure of 8 mmHg, the estimated right ventricular systolic pressure is 34.2 mmHg. Left Atrium: Left atrial size was severely dilated. Right Atrium: Right atrial size was normal in size. Pericardium: There is no evidence of pericardial effusion. Mitral Valve: The mitral valve is normal in structure. Moderate mitral annular calcification. Mild mitral valve regurgitation. No evidence of mitral valve stenosis. MV peak gradient, 10.6 mmHg. The mean mitral valve gradient is 1.0 mmHg. Tricuspid Valve: The tricuspid valve is normal in structure. Tricuspid valve regurgitation is mild . No evidence of tricuspid stenosis. Aortic Valve: The aortic valve is tricuspid. Aortic valve regurgitation is mild. Aortic regurgitation PHT measures 561 msec. Mild aortic valve sclerosis is present, with no evidence of aortic valve stenosis. Aortic valve mean gradient measures 4.0 mmHg. Aortic valve peak gradient measures 7.0 mmHg. Pulmonic Valve: The pulmonic valve was normal in structure. Pulmonic valve regurgitation is not visualized. No evidence of pulmonic stenosis. Aorta: The aortic root is normal in size and structure. Venous: The inferior vena cava is dilated in size with greater than 50% respiratory variability, suggesting right atrial pressure of 8 mmHg.   LEFT VENTRICLE PLAX 2D LVIDd:         3.50 cm   Diastology LVIDs:         2.40 cm  LV e' medial:    5.55 cm/s LV PW:         1.50 cm  LV E/e' medial:  25.6 LV IVS:        1.50 cm  LV e' lateral:   8.16 cm/s LVOT diam:     1.80 cm  LV E/e' lateral: 17.4 LV SV:         35 LV SV Index:   19 LVOT Area:     2.54 cm  RIGHT VENTRICLE            IVC RV Basal diam:  3.40 cm    IVC diam: 2.20 cm RV S prime:     7.51 cm/s TAPSE (M-mode): 2.2 cm LEFT ATRIUM              Index       RIGHT ATRIUM           Index LA diam:        4.70 cm  2.51 cm/m  RA Area:     17.60 cm LA Vol (A2C):   181.0 ml 96.77 ml/m RA Volume:   42.70 ml  22.83 ml/m LA Vol (A4C):   111.0 ml 59.35 ml/m LA Biplane Vol: 144.0 ml 76.99 ml/m  AORTIC VALVE AV Area (Vmax):    1.27 cm AV Area (Vmean):   1.26 cm AV Area (VTI):     1.24 cm AV Vmax:           132.00 cm/s AV Vmean:          87.500 cm/s AV VTI:            0.283 m AV Peak Grad:      7.0 mmHg AV Mean Grad:      4.0 mmHg LVOT Vmax:  65.80 cm/s LVOT Vmean:        43.400 cm/s LVOT VTI:          0.138 m LVOT/AV VTI ratio: 0.49 AI PHT:            561 msec  AORTA Ao Root diam: 3.00 cm Ao Asc diam:  3.00 cm MITRAL VALVE                TRICUSPID VALVE MV Area (PHT): 3.91 cm     TR Peak grad:   26.2 mmHg MV Area VTI:   0.89 cm     TR Vmax:        256.00 cm/s MV Peak grad:  10.6 mmHg MV Mean grad:  1.0 mmHg     SHUNTS MV Vmax:       1.63 m/s     Systemic VTI:  0.14 m MV Vmean:      47.0 cm/s    Systemic Diam: 1.80 cm MV Decel Time: 194 msec MV E velocity: 142.00 cm/s MV A velocity: 36.70 cm/s MV E/A ratio:  3.87 Olga Millers MD Electronically signed by Olga Millers MD Signature Date/Time: 01/07/2021/10:45:22 AM    Final     Scheduled Meds: .  stroke: mapping our early stages of recovery book   Does not apply Once  . apixaban  5 mg Oral BID  . citalopram  20 mg Oral Daily  . ezetimibe  10 mg Oral QHS   And  . simvastatin  40 mg Oral QHS  . metoprolol tartrate  50 mg Oral BID   Continuous Infusions:   LOS: 1 day   Time spent:  25 minutes.  Tyrone Nine, MD [facility] [email or web address] 01/08/2021, 11:22 AM

## 2021-01-08 NOTE — [place] FL2 (Signed)
[place] MEDICAID FL2 LEVEL OF CARE SCREENING TOOL     IDENTIFICATION  Patient Name: Ana Franco Birthdate: 11-29-1925 Sex: female Admission Date (Current Location): 01/06/2021  [facility] and [place] Number:  Producer, television/film/video and Address:  [facility], [address] [place], [place] [address]      Provider Number: [id number]  Attending Physician Name and Address:  Tyrone Nine, MD  Relative Name and Phone Number:  Johara, [place] [phone]  [phone]    Current Level of Care: Hospital Recommended Level of Care: Skilled Nursing Facility Prior Approval Number:    Date Approved/Denied:   PASRR Number: [id number]  Discharge Plan: SNF    Current Diagnoses: Patient Active Problem List   Diagnosis Date Noted  . Acute CVA (cerebrovascular accident) (HCC) 01/07/2021  . Thrombocytopenia (HCC) 01/06/2021  . TIA (transient ischemic attack) 01/06/2021  . CKD (chronic kidney disease), stage III (HCC) 01/06/2021  . DNR (do not resuscitate) 01/06/2021  . Paroxysmal atrial fibrillation (HCC) 05/04/2018  . Gangrene of lower extremity (HCC)   . Subacute osteomyelitis, left ankle and foot (HCC)   . S/P ORIF (open reduction internal fixation) fracture 04/03/2018  . Wound infection 04/02/2018  . MRSA (methicillin resistant staph aureus) culture positive 04/02/2018  . Acute blood loss anemia 04/02/2018  . Closed left ankle fracture, with delayed healing, subsequent encounter 03/31/2018  . Essential hypertension, benign 06/23/2014  . Dyslipidemia 06/23/2014  . GERD (gastroesophageal reflux disease) 06/23/2014  . UTI (urinary tract infection) 06/23/2014  . Depression 06/23/2014    Orientation RESPIRATION BLADDER Height & Weight     Self,Time,Situation,Place  Normal Incontinent Weight: 170 lb 13.7 oz (77.5 kg) Height:  5\' 6"  (167.6 cm)  BEHAVIORAL SYMPTOMS/MOOD NEUROLOGICAL BOWEL NUTRITION STATUS      Continent Diet (see discharge summary)   AMBULATORY STATUS COMMUNICATION OF NEEDS Skin   Limited Assist Verbally Surgical wounds                       Personal Care Assistance Level of Assistance  Bathing,Feeding,Dressing Bathing Assistance: Independent Feeding assistance: Independent Dressing Assistance: Independent     Functional Limitations Info  Sight,Hearing,Speech Sight Info: Adequate Hearing Info: Adequate Speech Info: Adequate    SPECIAL CARE FACTORS FREQUENCY  PT (By licensed PT),OT (By licensed OT)     PT Frequency: 5x week OT Frequency: 5x week            Contractures Contractures Info: Not present    Additional Factors Info  Code Status,Allergies Code Status Info: DNR Allergies Info: NKA           Current Medications (01/08/2021):  This is the current hospital active medication list Current Facility-Administered Medications  Medication Dose Route Frequency Provider Last Rate Last Admin  .  stroke: mapping our early stages of recovery book   Does not apply Once 01/10/2021 A, MD      . acetaminophen (TYLENOL) tablet 650 mg  650 mg Oral Q4H PRN Madelyn Flavors, MD       Or  . acetaminophen (TYLENOL) 160 MG/5ML solution 650 mg  650 mg Per Tube Q4H PRN Clydie Braun A, MD       Or  . acetaminophen (TYLENOL) suppository 650 mg  650 mg Rectal Q4H PRN Madelyn Flavors A, MD      . apixaban (ELIQUIS) tablet 5 mg  5 mg Oral BID Madelyn Flavors, MD   5 mg at 01/08/21 0920  . citalopram (  CELEXA) tablet 20 mg  20 mg Oral Daily Katrinka Blazing, Rondell A, MD   20 mg at 01/08/21 0920  . ezetimibe (ZETIA) tablet 10 mg  10 mg Oral QHS Pierce, Dwayne A, RPH   10 mg at 01/07/21 2120   And  . simvastatin (ZOCOR) tablet 40 mg  40 mg Oral QHS Pierce, Dwayne A, RPH   40 mg at 01/07/21 2121  . hydrALAZINE (APRESOLINE) injection 5 mg  5 mg Intravenous Q4H PRN Madelyn Flavors A, MD      . metoprolol tartrate (LOPRESSOR) tablet 50 mg  50 mg Oral BID Madelyn Flavors A, MD   50 mg at 01/08/21 0920  . senna-docusate  (Senokot-S) tablet 1 tablet  1 tablet Oral QHS PRN Clydie Braun, MD         Discharge Medications: Please see discharge summary for a list of discharge medications.  Relevant Imaging Results:  Relevant Lab Results:   Additional Information ssn:[id number]  Lorri Frederick, LCSW

## 2021-01-08 NOTE — Progress Notes (Signed)
Physical Therapy Treatment Patient Details Name: Ana Franco MRN: [id number] DOB: [birth year] Today's Date: 01/08/2021    History of Present Illness Ana Franco is a 87 y.o. 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  Noticing general improvement.  Emphasis on transitions, balance while donning prosthesis and shoes, sit to stand and progressing gait stability and stamina.  Follow Up Recommendations  CIR (short stay)     Equipment Recommendations  None recommended by PT    Recommendations for Other Services Rehab consult     Precautions / Restrictions Precautions Precautions: Fall Precaution Comments: L prosthesis Required Braces or Orthoses: Other Brace    Mobility  Bed Mobility Overal bed mobility: Needs Assistance Bed Mobility: Supine to Sit   Sidelying to sit: Min guard       General bed mobility comments: needed extra time, but moved LE's off bed and came forward via L elbow without assist.  HOB up mildly.    Transfers Overall transfer level: Needs assistance Equipment used: Rolling walker (2 wheeled) Transfers: Sit to/from Stand Sit to Stand: Min assist;From elevated surface         General transfer comment: minA to power up and steady during transition of hands from bed to RW  Ambulation/Gait Ambulation/Gait assistance: Min assist Gait Distance (Feet): 90 Feet Assistive device: Rolling walker (2 wheeled) Gait Pattern/deviations: Step-through pattern;Decreased stride length Gait velocity: dec Gait velocity interpretation: <1.8 ft/sec, indicate of risk for recurrent falls General Gait Details: generally steady and guard.  Trouble keeping posture and gaze foward, needing to look down at her  feet.   Stairs             Wheelchair Mobility    Modified Rankin (Stroke Patients Only) Modified Rankin (Stroke Patients Only) Pre-Morbid Rankin Score: No symptoms Modified Rankin: Moderately severe disability     Balance Overall balance assessment: Needs assistance   Sitting balance-Leahy Scale: Fair Sitting balance - Comments: can put on her prosthesis and shoes with struggle, difficulty tying shoes     Standing balance-Leahy Scale: Fair                              Cognition Arousal/Alertness: Awake/alert Behavior During Therapy: WFL for tasks assessed/performed Overall Cognitive Status: Within Functional Limits for tasks assessed                                        Exercises      General Comments General comments (skin integrity, edema, etc.): vss      Pertinent Vitals/Pain Pain Assessment: Faces Faces Pain Scale: No hurt Pain Intervention(s): Monitored during session    Home Living                      Prior Function            PT Goals (current goals can now be found in the care plan section) Acute Rehab PT Goals Patient Stated Goal: to get back to doing for myself again PT Goal Formulation: With patient Time For Goal Achievement: 01/14/21 Potential to Achieve Goals: Good Progress towards  PT goals: Progressing toward goals    Frequency    Min 4X/week      PT Plan Current plan remains appropriate    Co-evaluation              AM-PAC PT "6 Clicks" Mobility   Outcome Measure  Help needed turning from your back to your side while in a flat bed without using bedrails?: A Little Help needed moving from lying on your back to sitting on the side of a flat bed without using bedrails?: A Little Help needed moving to and from a bed to a chair (including a wheelchair)?: A Little Help needed standing up from a chair using your arms (e.g., wheelchair or bedside chair)?: A Little Help needed to  walk in hospital room?: A Little Help needed climbing 3-5 steps with a railing? : A Lot 6 Click Score: 17    End of Session   Activity Tolerance: Patient tolerated treatment well Patient left: in chair;with call bell/phone within reach;with chair alarm set;with family/visitor present Nurse Communication: Mobility status PT Visit Diagnosis: Unsteadiness on feet (R26.81);Other abnormalities of gait and mobility (R26.89)     Time: 4944-9675 PT Time Calculation (min) (ACUTE ONLY): 32 min  Charges:  $Gait Training: 8-22 mins $Therapeutic Activity: 8-22 mins                     01/08/2021  Jacinto Halim., PT Acute Rehabilitation Services [phone]  (pager) [phone]  (office)   Ana Franco Ana Franco 01/08/2021, 12:25 PM

## 2021-01-08 NOTE — TOC Progression Note (Signed)
Transition of Care [facility]) - Progression Note    Patient Details  Name: Ana Franco MRN: [id number] Date of Birth: 14-Mar-1926  [facility] CM/SW Contact  Lorri Frederick, LCSW Phone Number: 01/08/2021, 12:59 PM  Clinical Narrative:   CSW informed by CIR that pt is declining CIR, wants SNF.  CSW spoke with pt who confirms this.  Choice document given.  Pt interested in [facility].  Referral sent out in hub for SNF.    Expected Discharge Plan:  (to be determined) Barriers to Discharge: Continued Medical Work up  Expected Discharge Plan and Services Expected Discharge Plan:  (to be determined) In-house Referral: Clinical Social Work Discharge Planning Services: CM Consult   Living arrangements for the past 2 months: Single Family Home                                       Social Determinants of Health (SDOH) Interventions    Readmission Risk Interventions No flowsheet data found.

## 2021-01-08 NOTE — Progress Notes (Signed)
SLP Cancellation Note  Patient Details Name: Cameren Odwyer MRN: [id number] DOB: 08-18-1925   Cancelled treatment:       Reason Eval/Treat Not Completed: SLP screened, no needs identified, will sign off   Efe Fazzino, Riley Nearing 01/08/2021, 2:43 PM

## 2021-01-09 DIAGNOSIS — E785 Hyperlipidemia, unspecified: Secondary | ICD-10-CM | POA: Diagnosis not present

## 2021-01-09 DIAGNOSIS — Z66 Do not resuscitate: Secondary | ICD-10-CM | POA: Diagnosis not present

## 2021-01-09 DIAGNOSIS — F32A Depression, unspecified: Secondary | ICD-10-CM | POA: Diagnosis not present

## 2021-01-09 DIAGNOSIS — N1832 Chronic kidney disease, stage 3b: Secondary | ICD-10-CM | POA: Diagnosis not present

## 2021-01-09 LAB — SARS CORONAVIRUS 2 (TAT 6-24 HRS): SARS Coronavirus 2: NEGATIVE

## 2021-01-09 LAB — HEMOGLOBIN A1C
Hgb A1c MFr Bld: 5.7 % — ABNORMAL HIGH (ref 4.8–5.6)
Mean Plasma Glucose: 117 mg/dL

## 2021-01-09 LAB — CBC
HCT: 35.7 % — ABNORMAL LOW (ref 36.0–46.0)
Hemoglobin: 11.5 g/dL — ABNORMAL LOW (ref 12.0–15.0)
MCH: 30 pg (ref 26.0–34.0)
MCHC: 32.2 g/dL (ref 30.0–36.0)
MCV: 93.2 fL (ref 80.0–100.0)
Platelets: 131 10*3/uL — ABNORMAL LOW (ref 150–400)
RBC: 3.83 MIL/uL — ABNORMAL LOW (ref 3.87–5.11)
RDW: 13.3 % (ref 11.5–15.5)
WBC: 5.7 10*3/uL (ref 4.0–10.5)
nRBC: 0 % (ref 0.0–0.2)

## 2021-01-09 MED ORDER — APIXABAN 5 MG PO TABS: 5.0000 mg | ORAL_TABLET | Freq: Two times a day (BID) | ORAL | Status: DC

## 2021-01-09 MED ORDER — POLYETHYLENE GLYCOL 3350 17 G PO PACK: 17.0000 g | PACK | Freq: Every day | ORAL | Status: DC | PRN

## 2021-01-09 NOTE — Plan of Care (Signed)
  Problem: Clinical Measurements: Goal: Cardiovascular complication will be avoided Outcome: Progressing   Problem: Activity: Goal: Risk for activity intolerance will decrease Outcome: Progressing   Problem: Nutrition: Goal: Adequate nutrition will be maintained Outcome: Progressing   

## 2021-01-09 NOTE — Progress Notes (Signed)
Report called to [place] at [facility] in [place]. All questions answered.

## 2021-01-09 NOTE — Plan of Care (Signed)
  Problem: Clinical Measurements: Goal: Respiratory complications will improve Outcome: Progressing Goal: Cardiovascular complication will be avoided Outcome: Progressing   Problem: Activity: Goal: Risk for activity intolerance will decrease Outcome: Progressing Note: Ambulated with PT this morning and did well.   Problem: Nutrition: Goal: Adequate nutrition will be maintained Outcome: Progressing   Problem: Elimination: Goal: Will not experience complications related to bowel motility Outcome: Progressing Goal: Will not experience complications related to urinary retention Outcome: Progressing   Problem: Pain Managment: Goal: General experience of comfort will improve Outcome: Progressing

## 2021-01-09 NOTE — TOC Transition Note (Signed)
Transition of Care [facility]) - CM/SW Discharge Note   Patient Details  Name: Ana Franco MRN: [id number] Date of Birth: [birth year]  Transition of Care [facility]) CM/SW Contact:  Lorri Frederick, LCSW Phone Number: 01/09/2021, 3:45 PM   Clinical Narrative:   [facility].  RN call report to [phone].  RN, please call [facility] at [phone] when pt is ready for pick up.  Pt is on the will call list at [facility].     Final next level of care: Skilled Nursing Facility Barriers to Discharge: Barriers Resolved   Patient Goals and CMS Choice        Discharge Placement              [facility] at:  [facility] Patient to be transferred to facility by: PTAR Name of family member notified: son Brett Canales Patient and family notified of of transfer: 01/09/21  Discharge Plan and Services In-house Referral: Clinical Social Work Discharge Planning Services: CM Consult                                 Social Determinants of Health (SDOH) Interventions     Readmission Risk Interventions No flowsheet data found.

## 2021-01-09 NOTE — Progress Notes (Signed)
Physical Therapy Treatment Patient Details Name: Ana Franco MRN: [id number] DOB: 11-Aug-1926 Today's Date: 01/09/2021    History of Present Illness Ana Franco is a 87 y.o. 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 Improved ambulation distance with Min guard and use of RW for support. Requires Min A to stand from all surfaces. Able to donn prosthesis sitting EOB without difficulty. Reports strength is improving overall. Pt declines CIR and interested in going to SNF (specifically [facility]) so disposition updated as well as frequency. Highly motivated. Will continue to follow and progress.   Follow Up Recommendations  SNF;Supervision for mobility/OOB     Equipment Recommendations  None recommended by PT    Recommendations for Other Services       Precautions / Restrictions Precautions Precautions: Fall Precaution Comments: Lft prosthesis Required Braces or Orthoses: Other Brace Other Brace: Lft prosthesis Restrictions Weight Bearing Restrictions: No    Mobility  Bed Mobility Overal bed mobility: Needs Assistance Bed Mobility: Supine to Sit     Supine to sit: Min guard;HOB elevated     General bed mobility comments: No assist needed, use of rail to get to EOB.    Transfers Overall transfer level: Needs assistance Equipment used: Rolling walker (2 wheeled) Transfers: Sit to/from Stand Sit to Stand: Min assist;From elevated surface         General transfer comment: minA to power up and steady during transition of hands from bed to RW, stood from EOB x1, from toilet x1, transferred to chair post ambulation.  Ambulation/Gait Ambulation/Gait assistance: Min guard Gait Distance (Feet): 200  Feet Assistive device: Rolling walker (2 wheeled) Gait Pattern/deviations: Step-through pattern;Decreased stride length;Trunk flexed Gait velocity: dec   General Gait Details: generally steady and guarded.  Cues for upright posture and forward gaze. 1 standing rest break.   Stairs             Wheelchair Mobility    Modified Rankin (Stroke Patients Only) Modified Rankin (Stroke Patients Only) Pre-Morbid Rankin Score: No symptoms Modified Rankin: Moderately severe disability     Balance Overall balance assessment: Needs assistance Sitting-balance support: No upper extremity supported;Feet supported Sitting balance-Leahy Scale: Fair Sitting balance - Comments: Can donn prosthesis EOB without difficulty or assist.   Standing balance support: During functional activity Standing balance-Leahy Scale: Poor Standing balance comment: Requires UE support for walking/dynamic tasks. Total A for pericare                            Cognition Arousal/Alertness: Awake/alert Behavior During Therapy: WFL for tasks assessed/performed Overall Cognitive Status: Within Functional Limits for tasks assessed                                        Exercises      General Comments General comments (skin integrity, edema, etc.): VSS on RA.      Pertinent Vitals/Pain Pain Assessment: Faces Faces Pain Scale: Hurts a little bit Pain Location: neck Pain Descriptors / Indicators: Sore Pain Intervention(s): Monitored during session;Repositioned    Home Living  Prior Function            PT Goals (current goals can now be found in the care plan section) Progress towards PT goals: Progressing toward goals    Frequency    Min 3X/week      PT Plan Current plan remains appropriate;Frequency needs to be updated    Co-evaluation              AM-PAC PT "6 Clicks" Mobility   Outcome Measure  Help needed turning from your  back to your side while in a flat bed without using bedrails?: A Little Help needed moving from lying on your back to sitting on the side of a flat bed without using bedrails?: A Little Help needed moving to and from a bed to a chair (including a wheelchair)?: A Little Help needed standing up from a chair using your arms (e.g., wheelchair or bedside chair)?: A Little Help needed to walk in hospital room?: A Little Help needed climbing 3-5 steps with a railing? : A Lot 6 Click Score: 17    End of Session Equipment Utilized During Treatment: Gait belt Activity Tolerance: Patient tolerated treatment well Patient left: in chair;with call bell/phone within reach;with chair alarm set Nurse Communication: Mobility status;Other (comment) (purewick) PT Visit Diagnosis: Unsteadiness on feet (R26.81);Other abnormalities of gait and mobility (R26.89)     Time: 7948-0165 PT Time Calculation (min) (ACUTE ONLY): 25 min  Charges:  $Gait Training: 8-22 mins $Therapeutic Activity: 8-22 mins                     [name], PT, DPT Acute Rehabilitation Services Pager [phone] Office [phone]       [name] 01/09/2021, 11:05 AM

## 2021-01-09 NOTE — Discharge Summary (Signed)
Physician Discharge Summary  Ana Franco HQI:[id number] DOB: Oct 19, 1925 DOA: 01/06/2021  PCP: Gaspar Garbe, MD  Admit date: 01/06/2021 Discharge date: 01/09/2021  Admitted From: Home Disposition: SNF   Recommendations for Outpatient Follow-up:  1. Follow up with neurology in 4 weeks 2. Monitor BMP, CBC. Started eliquis for AFib with embolic stroke.   Home Health: N/A Equipment/Devices: Per SNF Discharge Condition: Stable CODE STATUS: DNR Diet recommendation: Heart healthy  Brief/Interim Summary: Ana Franco is a 87 y.o. female with a history of PAF not on anticoagulation due to falls, CAD, HLD, LLE infection s/p left transtibial amputation, and depression who presented to the ED by EMS when she noticed right arm weakness and son reported speech difficulty and confusion. Vital signs were stable, labs significant for platelet count of 146k, SCr 1.17. Neurology was consulted and MRI did confirm acute posterior left frontal ischemic CVA in distribution consistent with embolic stroke due to atrial fibrillation. Aspirin has been converted to eliquis after extensive discussions of risks and benefits of anticoagulation per stroke neurology. The patient will continue to require SNF level rehabilitation efforts which is pursued.   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 Left ACA infarct on MRI, MRA head/neck 40-50% right ICA bulb stenosis, 5mm left cavernous ICA aneurysm. LVEF 60-65%, LDL 44, HbA1c still pending.  - Eliquis to replace aspirin. Continue statin/zetia.  - Continue PT/OT at SNF.  - Follow up as as  outpatient with neurology at [facility] in about 4 weeks. Pt not a candidate for intervention on cavernous ICA aneurysm found on MRA due to comorbidities, age, and size per neurology.  PAF:  - CHA2DS2-VASc score is at least 7 inclusive of this embolic stroke. Started eliquis  po BID on 5/29. Consideration for dose reduction (despite SCr being <1.5 and weight >60kg), though neurology felt this would still present risk of bleeding without providing adequate protection from recurrent stroke. - Continue metoprolol for rate control  Stage IIIb CKD:  - Avoid nephrotoxins. Does not meet criteria for DOAC dose reduction, though BMP should be monitored.  HTN:  - Continue home medications, no longer requires permissive HTN.  Thrombocytopenia: Did have self-limited nose bleed several weeks ago when asked, no other bleeding noted, none currently.  - Stabilized at 131k on day of discharge.  - Monitor CBC.  HLD: LDL is 44 - Continue vytorin with no hemorrhage on neuroimaging.   s/p transtibial amputation: For infection.  - Continue PT  Depression:  - Continue SSRI   Discharge Instructions Discharge Instructions    Ambulatory referral to Neurology   Complete by: As directed    Follow up with Dr. Pearlean Brownie at [facility] in 4-6 weeks. Too complicated for RN to follow. Thanks.     Allergies as of 01/09/2021   No Known Allergies     Medication List    STOP taking these medications   aspirin 81 MG EC tablet   docusate sodium 100 MG capsule Commonly known as: COLACE   ondansetron 4 MG tablet Commonly known as: Zofran     TAKE these medications   acetaminophen 325 MG tablet Commonly known as: TYLENOL Take 325 mg  by mouth every 6 (six) hours as needed for headache (pain).   amLODipine 10 MG tablet Commonly known as: NORVASC Take 1 tablet by mouth daily.   apixaban 5 MG Tabs tablet Commonly known as: ELIQUIS Take 1 tablet (5 mg total) by mouth 2 (two) times daily.   beta carotene  w/minerals tablet Take 1 tablet by mouth daily.   Calcium Carbonate-Vitamin D 600-400 MG-UNIT chew tablet Chew 1 tablet by mouth at bedtime.   cholecalciferol 25 MCG (1000 UNIT) tablet Commonly known as: VITAMIN D3 Take 1,000 Units by mouth at bedtime.   citalopram 20 MG tablet Commonly known as: CELEXA Take 20 mg by mouth daily.   esomeprazole 40 MG capsule Commonly known as: NEXIUM Take 40 mg by mouth daily.   ezetimibe-simvastatin 10-40 MG tablet Commonly known as: VYTORIN Take 1 tablet by mouth at bedtime.   metoprolol tartrate 50 MG tablet Commonly known as: LOPRESSOR Take 50 mg by mouth 2 (two) times daily.   nitroGLYCERIN 0.4 MG SL tablet Commonly known as: NITROSTAT Place 0.4 mg under the tongue every 5 (five) minutes as needed for chest pain.   polyethylene glycol 17 g packet Commonly known as: MIRALAX / GLYCOLAX Take 17 g by mouth daily as needed for mild constipation.       Contact information for follow-up providers    [facility]. Schedule an appointment as soon as possible for a visit in 4 week(s).   Specialty: Neurology Contact information: [address] [place] [address] [phone]       Tisovec, Adelfa Koh, MD Follow up.   Specialty: Internal Medicine Contact information: [address] [place] [address] [phone]        Jodelle Red, MD .   Specialty: Cardiology Contact information: [address] [place] [address] [place] [address] [phone]            Contact information for after-discharge care    Destination    [facility]erred [facility] .   Service: Skilled Nursing Contact information: [address] [place] [address] [phone]                 No Known Allergies  Consultations:  Neurology  Procedures/Studies: MR ANGIO HEAD WO CONTRAST  Result Date: 01/06/2021 CLINICAL DATA:  Initial  evaluation for acute TIA, right-sided weakness./ EXAM: MRI HEAD WITHOUT CONTRAST MRA HEAD WITHOUT CONTRAST MRA NECK WITHOUT AND WITH CONTRAST TECHNIQUE: Multiplanar, multi-echo pulse sequences of the brain and surrounding structures were acquired without intravenous contrast. Angiographic images of the Circle of Willis were acquired using MRA technique without intravenous contrast. Angiographic images of the neck were acquired using MRA technique without and with intravenous contrast. Carotid stenosis measurements (when applicable) are obtained utilizing NASCET criteria, using the distal internal carotid diameter as the denominator. CONTRAST:  18mL GADAVIST GADOBUTROL 1 MMOL/ML IV SOLN COMPARISON:  None available. FINDINGS: MRI HEAD FINDINGS Brain: Diffuse prominence of the CSF containing spaces compatible generalized age-related cerebral atrophy. Patchy T2/FLAIR hyperintensity within the periventricular and deep white matter both cerebral hemispheres most consistent with chronic small vessel ischemic disease, mild for age. There is an approximate 1.9 cm curvilinear focus of restricted diffusion involving the cortical and subcortical aspect of the parasagittal posterior left frontal lobe (series 5, images 93-89, consistent with an acute ischemic infarct, left ACA distribution. Additional punctate focus of restricted diffusion also noted posteriorly at the parasagittal left parietal lobe (series 5, image 87). No  associated hemorrhage or mass effect. No other evidence for acute or subacute ischemia. Gray-white matter differentiation otherwise maintained. No encephalomalacia to suggest chronic cortical infarction elsewhere within the brain. No foci of susceptibility artifact to suggest acute or chronic intracranial hemorrhage. Note made of a 6 mm rounded T2 hypointense lesion at the left temporal convexity, likely a small excrescence related to the adjacent left temporal bone. No other mass lesion, midline shift or mass  effect. Diffuse ventricular prominence related global parenchymal volume loss without hydrocephalus. Pituitary gland suprasellar region normal. Midline structures intact. Vascular: Major intracranial vascular flow voids are maintained. Skull and upper cervical spine: Prominent degenerative thickening of the tectorial membrane and pannus formation noted about the dens with secondary mild narrowing at the craniocervical junction. Bone marrow signal intensity within normal limits. No scalp soft tissue abnormality. Sinuses/Orbits: Patient status post ocular lens replacement. Globes and orbital soft tissues demonstrate no acute finding. Left-to-right nasal septal deviation with associated concha bullosa. Paranasal sinuses are clear. No mastoid effusion. Inner ear structures grossly normal. Other: None. MRA HEAD FINDINGS ANTERIOR CIRCULATION: Visualized distal cervical segments of the internal carotid arteries are patent with antegrade flow. Petrous segments widely patent. Scattered atheromatous irregularity throughout the carotid siphons without flow-limiting stenosis. Cavernous left ICA irregular and somewhat diffusely ectatic. Superimposed 5 mm aneurysm seen projecting medially from the cavernous left ICA. This is better seen on corresponding MRA portion of this study (series 1069, image 7). Left A1 segment widely patent. Right A1 hypoplastic and/or absent, accounting for the diminutive right ICA is compared to the left. Normal anterior communicating artery complex. Visualized ACAs widely patent to their distal aspects. No M1 stenosis or occlusion. Normal MCA bifurcations. MCA branches well perfused and symmetric. Diffuse small vessel atheromatous irregularity. POSTERIOR CIRCULATION: Dominant left V4 segment widely patent to the vertebrobasilar junction. Diminutive right vertebral artery largely terminates in PICA, although a tiny branch ascending towards the vertebrobasilar junction. Right PICA patent. Left PICA not  seen. Basilar patent to its distal aspect without stenosis. Superior cerebellar arteries patent bilaterally. Both PCAs primarily supplied via the basilar well perfused to their distal aspects. MRA NECK FINDINGS AORTIC ARCH: Visualized aortic arch within normal limits for caliber with normal branch pattern. No hemodynamically significant stenosis seen about the origin of the great vessels. RIGHT CAROTID SYSTEM: Right CCA tortuous but patent to its distal aspect without stenosis. Atheromatous irregularity about the right carotid bulb with estimated stenosis of 40-50% by NASCET criteria (series 1063, image 5). Right ICA tortuous but otherwise patent distally to the skull base without stenosis, evidence for dissection, or occlusion. LEFT CAROTID SYSTEM: Left CCA tortuous but is widely patent to the bifurcation without stenosis. Mild atheromatous irregularity about the left carotid bulb without significant stenosis. Left ICA tortuous but otherwise patent to the skull base without stenosis, evidence for dissection or occlusion. VERTEBRAL ARTERIES: Both vertebral arteries arise from subclavian arteries. Left vertebral artery dominant, with a diffusely hypoplastic right vertebral artery. No proximal subclavian artery stenosis. 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 Vertebral arteries tortuous but otherwise patent without stenosis, evidence for dissection or occlusion. IMPRESSION: MRI HEAD IMPRESSION: 1. 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 2. Underlying age-related cerebral atrophy with mild chronic small vessel ischemic disease. MRA HEAD IMPRESSION: 1. Negative intracranial MRA for large vessel occlusion. 2. Diffuse small vessel atheromatous irregularity throughout the intracranial circulation. No proximal high-grade or correctable stenosis. 3. 5 mm aneurysm projecting medially from the  cavernous left ICA. This is better appreciated  on corresponding MRA neck portion of this exam. MRA NECK IMPRESSION: 1. 40-50% atheromatous stenosis about the right carotid bulb. Otherwise wide patency of both carotid artery systems within the neck. 2. 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 Vertebral arteries otherwise widely patent within the neck. 3. Diffuse tortuosity of the major arterial vasculature of the neck, suggesting chronic underlying hypertension. Electronically Signed   By: Rise Mu M.D.   On: 01/06/2021 20:37   MR Angiogram Neck W or Wo Contrast  Result Date: 01/06/2021 CLINICAL DATA:  Initial evaluation for acute TIA, right-sided weakness./ EXAM: MRI HEAD WITHOUT CONTRAST MRA HEAD WITHOUT CONTRAST MRA NECK WITHOUT AND WITH CONTRAST TECHNIQUE: Multiplanar, multi-echo pulse sequences of the brain and surrounding structures were acquired without intravenous contrast. Angiographic images of the Circle of Willis were acquired using MRA technique without intravenous contrast. Angiographic images of the neck were acquired using MRA technique without and with intravenous contrast. Carotid stenosis measurements (when applicable) are obtained utilizing NASCET criteria, using the distal internal carotid diameter as the denominator. CONTRAST:  5mL GADAVIST GADOBUTROL 1 MMOL/ML IV SOLN COMPARISON:  None available. FINDINGS: MRI HEAD FINDINGS Brain: Diffuse prominence of the CSF containing spaces compatible generalized age-related cerebral atrophy. Patchy T2/FLAIR hyperintensity within the periventricular and deep white matter both cerebral hemispheres most consistent with chronic small vessel ischemic disease, mild for age. There is an approximate 1.9 cm curvilinear focus of restricted diffusion involving the cortical and subcortical aspect of the parasagittal posterior left frontal lobe (series 5, images 93-89, consistent with an acute ischemic infarct, left ACA distribution. Additional punctate focus of  restricted diffusion also noted posteriorly at the parasagittal left parietal lobe (series 5, image 87). No associated hemorrhage or mass effect. No other evidence for acute or subacute ischemia. Gray-white matter differentiation otherwise maintained. No encephalomalacia to suggest chronic cortical infarction elsewhere within the brain. No foci of susceptibility artifact to suggest acute or chronic intracranial hemorrhage. Note made of a 6 mm rounded T2 hypointense lesion at the left temporal convexity, likely a small excrescence related to the adjacent left temporal bone. No other mass lesion, midline shift or mass effect. Diffuse ventricular prominence related global parenchymal volume loss without hydrocephalus. Pituitary gland suprasellar region normal. Midline structures intact. Vascular: Major intracranial vascular flow voids are maintained. Skull and upper cervical spine: Prominent degenerative thickening of the tectorial membrane and pannus formation noted about the dens with secondary mild narrowing at the craniocervical junction. Bone marrow signal intensity within normal limits. No scalp soft tissue abnormality. Sinuses/Orbits: Patient status post ocular lens replacement. Globes and orbital soft tissues demonstrate no acute finding. Left-to-right nasal septal deviation with associated concha bullosa. Paranasal sinuses are clear. No mastoid effusion. Inner ear structures grossly normal. Other: None. MRA HEAD FINDINGS ANTERIOR CIRCULATION: Visualized distal cervical segments of the internal carotid arteries are patent with antegrade flow. Petrous segments widely patent. Scattered atheromatous irregularity throughout the carotid siphons without flow-limiting stenosis. Cavernous left ICA irregular and somewhat diffusely ectatic. Superimposed 5 mm aneurysm seen projecting medially from the cavernous left ICA. This is better seen on corresponding MRA portion of this study (series 1069, image 7). Left A1 segment  widely patent. Right A1 hypoplastic and/or absent, accounting for the diminutive right ICA is compared to the left. Normal anterior communicating artery complex. Visualized ACAs widely patent to their distal aspects. No M1 stenosis or occlusion. Normal MCA bifurcations. MCA branches well perfused and symmetric. Diffuse  small vessel atheromatous irregularity. POSTERIOR CIRCULATION: Dominant left V4 segment widely patent to the vertebrobasilar junction. Diminutive right vertebral artery largely terminates in PICA, although a tiny branch ascending towards the vertebrobasilar junction. Right PICA patent. Left PICA not seen. Basilar patent to its distal aspect without stenosis. Superior cerebellar arteries patent bilaterally. Both PCAs primarily supplied via the basilar well perfused to their distal aspects. MRA NECK FINDINGS AORTIC ARCH: Visualized aortic arch within normal limits for caliber with normal branch pattern. No hemodynamically significant stenosis seen about the origin of the great vessels. RIGHT CAROTID SYSTEM: Right CCA tortuous but patent to its distal aspect without stenosis. Atheromatous irregularity about the right carotid bulb with estimated stenosis of 40-50% by NASCET criteria (series 1063, image 5). Right ICA tortuous but otherwise patent distally to the skull base without stenosis, evidence for dissection, or occlusion. LEFT CAROTID SYSTEM: Left CCA tortuous but is widely patent to the bifurcation without stenosis. Mild atheromatous irregularity about the left carotid bulb without significant stenosis. Left ICA tortuous but otherwise patent to the skull base without stenosis, evidence for dissection or occlusion. VERTEBRAL ARTERIES: Both vertebral arteries arise from subclavian arteries. Left vertebral artery dominant, with a diffusely hypoplastic right vertebral artery. No proximal subclavian artery stenosis. 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 Vertebral  arteries tortuous but otherwise patent without stenosis, evidence for dissection or occlusion. IMPRESSION: MRI HEAD IMPRESSION: 1. 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 2. Underlying age-related cerebral atrophy with mild chronic small vessel ischemic disease. MRA HEAD IMPRESSION: 1. Negative intracranial MRA for large vessel occlusion. 2. Diffuse small vessel atheromatous irregularity throughout the intracranial circulation. No proximal high-grade or correctable stenosis. 3. 5 mm aneurysm projecting medially from the cavernous left ICA. This is better appreciated on corresponding MRA neck portion of this exam. MRA NECK IMPRESSION: 1. 40-50% atheromatous stenosis about the right carotid bulb. Otherwise wide patency of both carotid artery systems within the neck. 2. 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 Vertebral arteries otherwise widely patent within the neck. 3. Diffuse tortuosity of the major arterial vasculature of the neck, suggesting chronic underlying hypertension. Electronically Signed   By: Rise Mu M.D.   On: 01/06/2021 20:37   MR BRAIN WO CONTRAST  Result Date: 01/06/2021 CLINICAL DATA:  Initial evaluation for acute TIA, right-sided weakness./ EXAM: MRI HEAD WITHOUT CONTRAST MRA HEAD WITHOUT CONTRAST MRA NECK WITHOUT AND WITH CONTRAST TECHNIQUE: Multiplanar, multi-echo pulse sequences of the brain and surrounding structures were acquired without intravenous contrast. Angiographic images of the Circle of Willis were acquired using MRA technique without intravenous contrast. Angiographic images of the neck were acquired using MRA technique without and with intravenous contrast. Carotid stenosis measurements (when applicable) are obtained utilizing NASCET criteria, using the distal internal carotid diameter as the denominator. CONTRAST:  38mL GADAVIST GADOBUTROL 1 MMOL/ML IV SOLN COMPARISON:  None  available. FINDINGS: MRI HEAD FINDINGS Brain: Diffuse prominence of the CSF containing spaces compatible generalized age-related cerebral atrophy. Patchy T2/FLAIR hyperintensity within the periventricular and deep white matter both cerebral hemispheres most consistent with chronic small vessel ischemic disease, mild for age. There is an approximate 1.9 cm curvilinear focus of restricted diffusion involving the cortical and subcortical aspect of the parasagittal posterior left frontal lobe (series 5, images 93-89, consistent with an acute ischemic infarct, left ACA distribution. Additional punctate focus of restricted diffusion also noted posteriorly at the parasagittal left parietal lobe (series 5, image  87). No associated hemorrhage or mass effect. No other evidence for acute or subacute ischemia. Gray-white matter differentiation otherwise maintained. No encephalomalacia to suggest chronic cortical infarction elsewhere within the brain. No foci of susceptibility artifact to suggest acute or chronic intracranial hemorrhage. Note made of a 6 mm rounded T2 hypointense lesion at the left temporal convexity, likely a small excrescence related to the adjacent left temporal bone. No other mass lesion, midline shift or mass effect. Diffuse ventricular prominence related global parenchymal volume loss without hydrocephalus. Pituitary gland suprasellar region normal. Midline structures intact. Vascular: Major intracranial vascular flow voids are maintained. Skull and upper cervical spine: Prominent degenerative thickening of the tectorial membrane and pannus formation noted about the dens with secondary mild narrowing at the craniocervical junction. Bone marrow signal intensity within normal limits. No scalp soft tissue abnormality. Sinuses/Orbits: Patient status post ocular lens replacement. Globes and orbital soft tissues demonstrate no acute finding. Left-to-right nasal septal deviation with associated concha bullosa.  Paranasal sinuses are clear. No mastoid effusion. Inner ear structures grossly normal. Other: None. MRA HEAD FINDINGS ANTERIOR CIRCULATION: Visualized distal cervical segments of the internal carotid arteries are patent with antegrade flow. Petrous segments widely patent. Scattered atheromatous irregularity throughout the carotid siphons without flow-limiting stenosis. Cavernous left ICA irregular and somewhat diffusely ectatic. Superimposed 5 mm aneurysm seen projecting medially from the cavernous left ICA. This is better seen on corresponding MRA portion of this study (series 1069, image 7). Left A1 segment widely patent. Right A1 hypoplastic and/or absent, accounting for the diminutive right ICA is compared to the left. Normal anterior communicating artery complex. Visualized ACAs widely patent to their distal aspects. No M1 stenosis or occlusion. Normal MCA bifurcations. MCA branches well perfused and symmetric. Diffuse small vessel atheromatous irregularity. POSTERIOR CIRCULATION: Dominant left V4 segment widely patent to the vertebrobasilar junction. Diminutive right vertebral artery largely terminates in PICA, although a tiny branch ascending towards the vertebrobasilar junction. Right PICA patent. Left PICA not seen. Basilar patent to its distal aspect without stenosis. Superior cerebellar arteries patent bilaterally. Both PCAs primarily supplied via the basilar well perfused to their distal aspects. MRA NECK FINDINGS AORTIC ARCH: Visualized aortic arch within normal limits for caliber with normal branch pattern. No hemodynamically significant stenosis seen about the origin of the great vessels. RIGHT CAROTID SYSTEM: Right CCA tortuous but patent to its distal aspect without stenosis. Atheromatous irregularity about the right carotid bulb with estimated stenosis of 40-50% by NASCET criteria (series 1063, image 5). Right ICA tortuous but otherwise patent distally to the skull base without stenosis, evidence  for dissection, or occlusion. LEFT CAROTID SYSTEM: Left CCA tortuous but is widely patent to the bifurcation without stenosis. Mild atheromatous irregularity about the left carotid bulb without significant stenosis. Left ICA tortuous but otherwise patent to the skull base without stenosis, evidence for dissection or occlusion. VERTEBRAL ARTERIES: Both vertebral arteries arise from subclavian arteries. Left vertebral artery dominant, with a diffusely hypoplastic right vertebral artery. No proximal subclavian artery stenosis. 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 Vertebral arteries tortuous but otherwise patent without stenosis, evidence for dissection or occlusion. IMPRESSION: MRI HEAD IMPRESSION: 1. 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 2. Underlying age-related cerebral atrophy with mild chronic small vessel ischemic disease. MRA HEAD IMPRESSION: 1. Negative intracranial MRA for large vessel occlusion. 2. Diffuse small vessel atheromatous irregularity throughout the intracranial circulation. No proximal high-grade or correctable stenosis. 3. 5 mm aneurysm projecting medially  from the cavernous left ICA. This is better appreciated on corresponding MRA neck portion of this exam. MRA NECK IMPRESSION: 1. 40-50% atheromatous stenosis about the right carotid bulb. Otherwise wide patency of both carotid artery systems within the neck. 2. 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 Vertebral arteries otherwise widely patent within the neck. 3. Diffuse tortuosity of the major arterial vasculature of the neck, suggesting chronic underlying hypertension. Electronically Signed   By: Rise MuBenjamin  McClintock M.D.   On: 01/06/2021 20:37   DG Chest Portable 1 View  Result Date: 01/06/2021 CLINICAL DATA:  weakness EXAM: PORTABLE CHEST - 1 VIEW COMPARISON:  06/12/2014 FINDINGS: The mediastinal contours are  within normal limits. Stable mild cardiomegaly. The lungs are clear bilaterally without evidence of focal consolidation, pleural effusion, or pneumothorax. Atherosclerotic calcifications of the thoracic aorta. No acute osseous abnormality. IMPRESSION: 1. No acute cardiopulmonary process. 2. Stable cardiomegaly. 3.  Aortic Atherosclerosis (ICD10-I70.0). Electronically Signed   By: Marliss Cootsylan  Suttle MD   On: 01/06/2021 12:47   ECHOCARDIOGRAM COMPLETE  Result Date: 01/07/2021    ECHOCARDIOGRAM REPORT   Patient Name:   Ana SanesDOROTHY Luis Date of Exam: 01/07/2021 Medical Rec #:  [id number]      Height:       66.0 in Accession #:    [id number]     Weight:       170.9 lb Date of Birth:  1926/06/25      BSA:          1.870 m Patient Age:    87 years       BP:           148/62 mmHg Patient Gender: F              HR:           63 bpm. Exam Location:  Inpatient Procedure: 2D Echo, Cardiac Doppler and Color Doppler Indications:    TIA  History:        Patient has no prior history of Echocardiogram examinations. CAD                 and Previous Myocardial Infarction, Arrythmias:Atrial                 Fibrillation; Risk Factors:Hypertension. GERD.  Sonographer:    Ross LudwigArthur Guy RDCS (AE) Referring Phys: [id number] RONDELL A SMITH IMPRESSIONS  1. Left ventricular ejection fraction, by estimation, is 60 to 65%. The left ventricle has normal function. The left ventricle has no regional wall motion abnormalities. There is moderate left ventricular hypertrophy. Left ventricular diastolic function  could not be evaluated. Elevated left atrial pressure.  2. Right ventricular systolic function is normal. The right ventricular size is normal. There is normal pulmonary artery systolic pressure.  3. Left atrial size was severely dilated.  4. The mitral valve is normal in structure. Mild mitral valve regurgitation. No evidence of mitral stenosis. Moderate mitral annular calcification.  5. The aortic valve is tricuspid. Aortic valve regurgitation is  mild. Mild aortic valve sclerosis is present, with no evidence of aortic valve stenosis.  6. The inferior vena cava is dilated in size with >50% respiratory variability, suggesting right atrial pressure of 8 mmHg. FINDINGS  Left Ventricle: Left ventricular ejection fraction, by estimation, is 60 to 65%. The left ventricle has normal function. The left ventricle has no regional wall motion abnormalities. The left ventricular internal cavity size was normal in size. There is  moderate left ventricular hypertrophy. Left  ventricular diastolic function could not be evaluated due to atrial fibrillation. Left ventricular diastolic function could not be evaluated. Elevated left atrial pressure. Right Ventricle: The right ventricular size is normal.Right ventricular systolic function is normal. There is normal pulmonary artery systolic pressure. The tricuspid regurgitant velocity is 2.56 m/s, and with an assumed right atrial pressure of 8 mmHg, the estimated right ventricular systolic pressure is 34.2 mmHg. Left Atrium: Left atrial size was severely dilated. Right Atrium: Right atrial size was normal in size. Pericardium: There is no evidence of pericardial effusion. Mitral Valve: The mitral valve is normal in structure. Moderate mitral annular calcification. Mild mitral valve regurgitation. No evidence of mitral valve stenosis. MV peak gradient, 10.6 mmHg. The mean mitral valve gradient is 1.0 mmHg. Tricuspid Valve: The tricuspid valve is normal in structure. Tricuspid valve regurgitation is mild . No evidence of tricuspid stenosis. Aortic Valve: The aortic valve is tricuspid. Aortic valve regurgitation is mild. Aortic regurgitation PHT measures 561 msec. Mild aortic valve sclerosis is present, with no evidence of aortic valve stenosis. Aortic valve mean gradient measures 4.0 mmHg. Aortic valve peak gradient measures 7.0 mmHg. Pulmonic Valve: The pulmonic valve was normal in structure. Pulmonic valve regurgitation is not  visualized. No evidence of pulmonic stenosis. Aorta: The aortic root is normal in size and structure. Venous: The inferior vena cava is dilated in size with greater than 50% respiratory variability, suggesting right atrial pressure of 8 mmHg.   LEFT VENTRICLE PLAX 2D LVIDd:         3.50 cm  Diastology LVIDs:         2.40 cm  LV e' medial:    5.55 cm/s LV PW:         1.50 cm  LV E/e' medial:  25.6 LV IVS:        1.50 cm  LV e' lateral:   8.16 cm/s LVOT diam:     1.80 cm  LV E/e' lateral: 17.4 LV SV:         35 LV SV Index:   19 LVOT Area:     2.54 cm  RIGHT VENTRICLE            IVC RV Basal diam:  3.40 cm    IVC diam: 2.20 cm RV S prime:     7.51 cm/s TAPSE (M-mode): 2.2 cm LEFT ATRIUM              Index       RIGHT ATRIUM           Index LA diam:        4.70 cm  2.51 cm/m  RA Area:     17.60 cm LA Vol (A2C):   181.0 ml 96.77 ml/m RA Volume:   42.70 ml  22.83 ml/m LA Vol (A4C):   111.0 ml 59.35 ml/m LA Biplane Vol: 144.0 ml 76.99 ml/m  AORTIC VALVE AV Area (Vmax):    1.27 cm AV Area (Vmean):   1.26 cm AV Area (VTI):     1.24 cm AV Vmax:           132.00 cm/s AV Vmean:          87.500 cm/s AV VTI:            0.283 m AV Peak Grad:      7.0 mmHg AV Mean Grad:      4.0 mmHg LVOT Vmax:         65.80 cm/s LVOT Vmean:  43.400 cm/s LVOT VTI:          0.138 m LVOT/AV VTI ratio: 0.49 AI PHT:            561 msec  AORTA Ao Root diam: 3.00 cm Ao Asc diam:  3.00 cm MITRAL VALVE                TRICUSPID VALVE MV Area (PHT): 3.91 cm     TR Peak grad:   26.2 mmHg MV Area VTI:   0.89 cm     TR Vmax:        256.00 cm/s MV Peak grad:  10.6 mmHg MV Mean grad:  1.0 mmHg     SHUNTS MV Vmax:       1.63 m/s     Systemic VTI:  0.14 m MV Vmean:      47.0 cm/s    Systemic Diam: 1.80 cm MV Decel Time: 194 msec MV E velocity: 142.00 cm/s MV A velocity: 36.70 cm/s MV E/A ratio:  3.87 Olga Millers MD Electronically signed by Olga Millers MD Signature Date/Time: 01/07/2021/10:45:22 AM    Final     Subjective: Feels well,  right hand clumsiness/weakness slight but stable. No other complaints.   Discharge Exam: Vitals:   01/08/21 2055 01/09/21 0959  BP: (!) 129/56 133/63  Pulse: (!) 57 65  Resp: 18 20  Temp: 97.6 F (36.4 C)   SpO2: 95% 96%   General: Pt is alert, awake, not in acute distress Cardiovascular: Irreg, no murmur or LE edema Respiratory: CTA bilaterally, no wheezing, no rhonchi Abdominal: Soft, NT, ND, bowel sounds +  Labs: Basic Metabolic Panel: Recent Labs  Lab 01/06/21 1230 01/08/21 0122  NA 135 136  K 4.2 3.6  CL 102 103  CO2 24 25  GLUCOSE 145* 101*  BUN 29* 25*  CREATININE 1.17* 1.13*  CALCIUM 8.9 8.9  MG 2.0  --    Liver Function Tests: Recent Labs  Lab 01/06/21 1230  AST 28  ALT 17  ALKPHOS 77  BILITOT 0.6  PROT 6.3*  ALBUMIN 3.5   No results for input(s): LIPASE, AMYLASE in the last 168 hours. No results for input(s): AMMONIA in the last 168 hours. CBC: Recent Labs  Lab 01/06/21 1310 01/08/21 0122 01/09/21 0139  WBC 7.8 5.7 5.7  NEUTROABS 5.9  --   --   HGB 12.6 11.8* 11.5*  HCT 39.8 35.7* 35.7*  MCV 95.7 92.2 93.2  PLT 146* 129* 131*   Cardiac Enzymes: No results for input(s): CKTOTAL, CKMB, CKMBINDEX, TROPONINI in the last 168 hours. BNP: Invalid input(s): POCBNP CBG: No results for input(s): GLUCAP in the last 168 hours. D-Dimer No results for input(s): DDIMER in the last 72 hours. Hgb A1c No results for input(s): HGBA1C in the last 72 hours. Lipid Profile Recent Labs    01/07/21 0126  CHOL 95  HDL 41  LDLCALC 44  TRIG 52  CHOLHDL 2.3   Thyroid function studies No results for input(s): TSH, T4TOTAL, T3FREE, THYROIDAB in the last 72 hours.  Invalid input(s): FREET3 Anemia work up No results for input(s): VITAMINB12, FOLATE, FERRITIN, TIBC, IRON, RETICCTPCT in the last 72 hours. Urinalysis    Component Value Date/Time   COLORURINE YELLOW 06/13/2014 0018   APPEARANCEUR CLOUDY (A) 06/13/2014 0018   LABSPEC 1.018 06/13/2014 0018    PHURINE 5.0 06/13/2014 0018   GLUCOSEU NEGATIVE 06/13/2014 0018   HGBUR MODERATE (A) 06/13/2014 0018   BILIRUBINUR NEGATIVE 06/13/2014 0018   KETONESUR  NEGATIVE 06/13/2014 0018   PROTEINUR NEGATIVE 06/13/2014 0018   UROBILINOGEN 0.2 06/13/2014 0018   NITRITE POSITIVE (A) 06/13/2014 0018   LEUKOCYTESUR LARGE (A) 06/13/2014 0018    Microbiology Recent Results (from the past 240 hour(s))  Resp Panel by RT-PCR (Flu A&B, Covid) Nasopharyngeal Swab     Status: None   Collection Time: 01/06/21 12:32 PM   Specimen: Nasopharyngeal Swab; Nasopharyngeal(NP) swabs in vial transport medium  Result Value Ref Range Status   SARS Coronavirus 2 by RT PCR NEGATIVE NEGATIVE Final    Comment: (NOTE) SARS-CoV-2 target nucleic acids are NOT DETECTED.  The SARS-CoV-2 RNA is generally detectable in upper respiratory specimens during the acute phase of infection. The lowest concentration of SARS-CoV-2 viral copies this assay can detect is 138 copies/mL. A negative result does not preclude SARS-Cov-2 infection and should not be used as the sole basis for treatment or other patient management decisions. A negative result may occur with  improper specimen collection/handling, submission of specimen other than nasopharyngeal swab, presence of viral mutation(s) within the areas targeted by this assay, and inadequate number of viral copies(<138 copies/mL). A negative result must be combined with clinical observations, patient history, and epidemiological information. The expected result is Negative.  Fact Sheet for Patients:  BloggerCourse.com  Fact Sheet for Healthcare Providers:  SeriousBroker.it  This test is no t yet approved or cleared by the [place] FDA and  has been authorized for detection and/or diagnosis of SARS-CoV-2 by FDA under an Emergency Use Authorization (EUA). This EUA will remain  in effect (meaning this test can be used) for  the duration of the COVID-19 declaration under Section 564(b)(1) of the Act, 21 U.S.C.section 360bbb-3(b)(1), unless the authorization is terminated  or revoked sooner.       Influenza A by PCR NEGATIVE NEGATIVE Final   Influenza B by PCR NEGATIVE NEGATIVE Final    Comment: (NOTE) The Xpert Xpress SARS-CoV-2/FLU/RSV plus assay is intended as an aid in the diagnosis of influenza from Nasopharyngeal swab specimens and should not be used as a sole basis for treatment. Nasal washings and aspirates are unacceptable for Xpert Xpress SARS-CoV-2/FLU/RSV testing.  Fact Sheet for Patients: BloggerCourse.com  Fact Sheet for Healthcare Providers: SeriousBroker.it  This test is not yet approved or cleared by the [place] FDA and has been authorized for detection and/or diagnosis of SARS-CoV-2 by FDA under an Emergency Use Authorization (EUA). This EUA will remain in effect (meaning this test can be used) for the duration of the COVID-19 declaration under Section 564(b)(1) of the Act, 21 U.S.C. section 360bbb-3(b)(1), unless the authorization is terminated or revoked.  Performed at [facility], [address], [place], [place] [address]     Time coordinating discharge: Approximately 40 minutes  Tyrone Nine, MD  [facility] 01/09/2021, 1:34 PM

## 2021-01-09 NOTE — TOC Progression Note (Signed)
Transition of Care [facility]) - Progression Note    Patient Details  Name: Ana Franco MRN: [id number] Date of Birth: 12-12-1925  [facility] CM/SW Contact  Lorri Frederick, LCSW Phone Number: 01/09/2021, 1:23 PM  Clinical Narrative:   CSW discussed bed offers with pt, still interested in [facility].  [facility] accepted pt and then pt son discussed with pt that they would prefer [facility] in [place] due to location.  Pt in agreement with this.  CSW confirmed with GraceAnn at [facility] that they can take pt today.  They do need covid test.  [facility] approves: 5/31-6/2.  Reference: [id number].    [facility] informed that pt not coming.    Expected Discharge Plan:  (to be determined) Barriers to Discharge: Continued Medical Work up  Expected Discharge Plan and Services Expected Discharge Plan:  (to be determined) In-house Referral: Clinical Social Work Discharge Planning Services: CM Consult   Living arrangements for the past 2 months: Single Family Home Expected Discharge Date: 01/09/21                                     Social Determinants of Health (SDOH) Interventions    Readmission Risk Interventions No flowsheet data found.

## 2021-02-02 ENCOUNTER — Ambulatory Visit: Payer: Medicare Other | Admitting: Cardiology

## 2021-02-15 ENCOUNTER — Ambulatory Visit: Payer: Medicare Other | Admitting: Medical

## 2021-03-27 ENCOUNTER — Other Ambulatory Visit: Payer: Self-pay

## 2021-03-27 ENCOUNTER — Ambulatory Visit: Payer: Medicare Other | Admitting: Physician Assistant

## 2021-03-27 VITALS — BP 128/56 | HR 55 | Ht 66.0 in | Wt 172.2 lb

## 2021-03-27 DIAGNOSIS — I10 Essential (primary) hypertension: Secondary | ICD-10-CM | POA: Diagnosis not present

## 2021-03-27 DIAGNOSIS — E785 Hyperlipidemia, unspecified: Secondary | ICD-10-CM | POA: Diagnosis not present

## 2021-03-27 DIAGNOSIS — Z79899 Other long term (current) drug therapy: Secondary | ICD-10-CM

## 2021-03-27 DIAGNOSIS — Z8673 Personal history of transient ischemic attack (TIA), and cerebral infarction without residual deficits: Secondary | ICD-10-CM

## 2021-03-27 DIAGNOSIS — I4821 Permanent atrial fibrillation: Secondary | ICD-10-CM

## 2021-03-27 DIAGNOSIS — I214 Non-ST elevation (NSTEMI) myocardial infarction: Secondary | ICD-10-CM

## 2021-03-27 NOTE — Progress Notes (Addendum)
Cardiology Office Note:    Date:  03/28/2021   ID:  Ana Franco, DOB 1926-06-03, MRN [id number]  PCP:  Gaspar Garbe, MD   [facility] Providers Cardiologist:  Jodelle Red, MD {  Referring MD: Gaspar Garbe, MD   Chief Complaint  Patient presents with   Follow-up    Seen for Dr. Cristal Deer    History of Present Illness:    Ana Franco is a 87 y.o. female with a hx of hypertension, hyperlipidemia, chronic atrial fibrillation, anemia, left transtibial amputation and history of NSTEMI.  According to prior chart review, patient suffered a fall while staying at the beach in [place] in July 2019 and resulted in ankle fracture.  He was treated with ORIF.  Hospital course was complicated by troponin elevation consistent with NSTEMI.  She did not pursue cardiac catheterization at the time.  Echocardiogram was performed on 02/21/2018 that showed moderate MR, trivial AI, moderate TR, mild LVH, EF 59% with no segmental wall motion abnormality, grade 2 DD, severely enlarged left atrium.  Unfortunately, she had a complication rate related to resistant MRSA infection that ultimately resulted in hardware removal via August 2019 and transtibial amputation near the end of August 2019.  Although she carries a previous history of atrial fibrillation, however has not been anticoagulated in the past due to fall risk.  Patient was last seen by Dr. Jodelle Red in the office on 05/04/2018 and has been lost to follow-up since.  EKG obtained in September 2019 shows she was in atrial fibrillation at the time.  Recently, patient was admitted in May 2022 due to right arm weakness and slurring of speech with confusion.  EKG on arrival showed rate controlled atrial fibrillation.  MRI confirmed acute posterior left frontal ischemic CVA in the distribution consistent with embolic stroke.  Aspirin was switched to Eliquis given the history of atrial fibrillation.  MRA of the head and neck  showed 40 to 50% right ICA stenosis, 5 mm left cavernous ICA aneurysm.  Echocardiogram showed EF 60 to 65%, mild MR, severe LAE, mild AI.  Patient presents today for follow-up.  She does not have any significant upper extremity weakness.  She has left lower extremity prosthesis.  Family denies any significant facial droop or slurring of speech.  She is currently residing in [facility] assisted living facility. She is accompanied by her son.  Overall, she has been doing well from the cardiac perspective and denies any chest pain.  She has no lower extremity edema, orthopnea or PND.  Heart rate and blood pressure are very well controlled.  She will need a CBC today.  Otherwise she can follow-up with Dr. Cristal Deer in 3 to 4 months.   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)    Chronic venous insufficiency    Depression    Difficulty in walking    Displaced trimalleolar fracture of left lower leg    Essential (primary) hypertension    GERD (gastroesophageal reflux disease)    History of blood transfusion    02/2018    History of falling    Hyperlipidemia    Hyperlipidemia    Hypertension    Infection and inflammatory reaction due to other internal orthopedic prosthetic devices, implants and grafts, subsequent encounter    Iron deficiency    Major depressive disorder    Muscle weakness (generalized)  Myocardial infarction (HCC)    nonstemi mi - 02/19/2018 at [facility] after ankle fracture    Nutritional deficiency, unspecified    Obesity    Other lack of coordination    Unspecified atrial fibrillation (HCC)    Unspecified osteoarthritis, unspecified site     Past Surgical History:  Procedure Laterality Date   AMPUTATION Left 04/10/2018   Procedure: LEFT BELOW KNEE AMPUTATION;  Surgeon: Nadara Mustard, MD;  Location: [facility] OR;  Service: Orthopedics;   Laterality: Left;   APPENDECTOMY     APPLICATION OF WOUND VAC Left 03/31/2018   Procedure: APPLICATION OF WOUND VAC;  Surgeon: Sheral Apley, MD;  Location: [facility] ORS;  Service: Orthopedics;  Laterality: Left;   HARDWARE REMOVAL Left 03/31/2018   Procedure: HARDWARE REMOVAL;  Surgeon: Sheral Apley, MD;  Location: [facility] ORS;  Service: Orthopedics;  Laterality: Left;   IRRIGATION AND DEBRIDEMENT FOOT Left 03/31/2018   Procedure: IRRIGATION AND DEBRIDEMENT OF LEFT ANKLE;  Surgeon: Sheral Apley, MD;  Location: [facility] ORS;  Service: Orthopedics;  Laterality: Left;   ORIF ANKLE FRACTURE Right 06/13/2014   Procedure: OPEN REDUCTION INTERNAL FIXATION (ORIF) BIMALLEOLAR ANKLE FRACTURE;  Surgeon: Loreta Ave, MD;  Location: [facility] OR;  Service: Orthopedics;  Laterality: Right;    Current Medications: Current Meds  Medication Sig   acetaminophen (TYLENOL) 325 MG tablet Take 325 mg by mouth every 6 (six) hours as needed for headache (pain).   amLODipine (NORVASC) 10 MG tablet Take 1 tablet by mouth daily.   apixaban (ELIQUIS) 5 MG TABS tablet Take 1 tablet (5 mg total) by mouth 2 (two) times daily.   beta carotene w/minerals (OCUVITE) tablet Take 1 tablet by mouth daily.    Calcium Carbonate-Vitamin D 600-400 MG-UNIT chew tablet Chew 1 tablet by mouth at bedtime.   cholecalciferol (VITAMIN D3) 25 MCG (1000 UNIT) tablet Take 1,000 Units by mouth at bedtime.   citalopram (CELEXA) 20 MG tablet Take 20 mg by mouth daily.   esomeprazole (NEXIUM) 40 MG capsule Take 40 mg by mouth daily.    ezetimibe-simvastatin (VYTORIN) 10-40 MG per tablet Take 1 tablet by mouth at bedtime.   metoprolol tartrate (LOPRESSOR) 50 MG tablet Take 50 mg by mouth 2 (two) times daily.    polyethylene glycol (MIRALAX / GLYCOLAX) 17 g packet Take 17 g by mouth daily as needed for mild constipation.     Allergies:   Patient has no known allergies.   Social History   Socioeconomic History   Marital status: Widowed    Spouse  name: Not on file   Number of children: Not on file   Years of education: Not on file   Highest education level: Not on file  Occupational History   Not on file  Tobacco Use   Smoking status: Never   Smokeless tobacco: Never  Vaping Use   Vaping Use: Never used  Substance and Sexual Activity   Alcohol use: No   Drug use: No   Sexual activity: Not Currently  Other Topics Concern   Not on file  Social History Narrative   Not on file   Social Determinants of Health   Financial Resource Strain: Not on file  Food Insecurity: Not on file  Transportation Needs: Not on file  Physical Activity: Not on file  Stress: Not on file  Social Connections: Not on file     Family History: The patient's family history is not on file.  ROS:  Please see the history of present illness.     All other systems reviewed and are negative.  EKGs/Labs/Other Studies Reviewed:    The following studies were reviewed today:  Echo 01/07/2021 1. Left ventricular ejection fraction, by estimation, is 60 to 65%. The  left ventricle has normal function. The left ventricle has no regional  wall motion abnormalities. There is moderate left ventricular hypertrophy.  Left ventricular diastolic function   could not be evaluated. Elevated left atrial pressure.   2. Right ventricular systolic function is normal. The right ventricular  size is normal. There is normal pulmonary artery systolic pressure.   3. Left atrial size was severely dilated.   4. The mitral valve is normal in structure. Mild mitral valve  regurgitation. No evidence of mitral stenosis. Moderate mitral annular  calcification.   5. The aortic valve is tricuspid. Aortic valve regurgitation is mild.  Mild aortic valve sclerosis is present, with no evidence of aortic valve  stenosis.   6. The inferior vena cava is dilated in size with >50% respiratory  variability, suggesting right atrial pressure of 8 mmHg.   EKG:  EKG is ordered today.   The ekg ordered today demonstrates atrial fibrillation with controlled HR  Recent Labs: 01/06/2021: ALT 17; Magnesium 2.0; TSH 1.672 01/08/2021: BUN 25; Creatinine, Ser 1.13; Potassium 3.6; Sodium 136 03/27/2021: Hemoglobin 12.0; Platelets 143  Recent Lipid Panel    Component Value Date/Time   CHOL 95 01/07/2021 0126   TRIG 52 01/07/2021 0126   HDL 41 01/07/2021 0126   CHOLHDL 2.3 01/07/2021 0126   VLDL 10 01/07/2021 0126   LDLCALC 44 01/07/2021 0126     Risk Assessment/Calculations:    CHA2DS2-VASc Score = 7  This indicates a 11.2% annual risk of stroke. The patient's score is based upon: CHF History: No HTN History: Yes Diabetes History: No Stroke History: Yes Vascular Disease History: Yes Age Score: 2 Gender Score: 1         Physical Exam:    VS:  BP (!) 128/56   Pulse (!) 55   Ht 5\' 6"  (1.676 m)   Wt 172 lb 3.2 oz (78.1 kg)   BMI 27.79 kg/m     Wt Readings from Last 3 Encounters:  03/28/21 172 lb 3.2 oz (78.1 kg)  01/06/21 170 lb 13.7 oz (77.5 kg)  10/26/18 211 lb (95.7 kg)     GEN:  Well nourished, well developed in no acute distress HEENT: Normal NECK: No JVD; No carotid bruits LYMPHATICS: No lymphadenopathy CARDIAC: Irregularly irregular, no murmurs, rubs, gallops RESPIRATORY:  Clear to auscultation without rales, wheezing or rhonchi  ABDOMEN: Soft, non-tender, non-distended MUSCULOSKELETAL:  No edema; No deformity  SKIN: Warm and dry NEUROLOGIC:  Alert and oriented x 3 PSYCHIATRIC:  Normal affect   ASSESSMENT:    1. Permanent atrial fibrillation (HCC)   2. Medication management   3. Primary hypertension   4. Hyperlipidemia LDL goal <70   5. Non-ST elevation (NSTEMI) myocardial infarction (HCC)   6. H/O: CVA (cerebrovascular accident)    PLAN:    In order of problems listed above:  Permanent atrial fibrillation: Patient has been in atrial fibrillation for the past few years.  Initially, she was not placed on anticoagulation therapy due to  concern for fall risk.  Unfortunately, she recently had a stroke, she was placed on anticoagulation Eliquis 5 mg twice a day at that time.  Hypertension: Blood pressure stable on current therapy  Hyperlipidemia: On Vytorin.  Cholesterol  well controlled on recent lab work  History of NSTEMI: Occurred in 2019 in the setting of fall.  She did not undergo any ischemic work-up given advanced age  History of CVA: Recent CVA occurred in the setting of permanent atrial fibrillation.  She has since been placed on Eliquis.  Denies any bleeding issue.  Obtain CBC today.        Medication Adjustments/Labs and Tests Ordered: Current medicines are reviewed at length with the patient today.  Concerns regarding medicines are outlined above.  Orders Placed This Encounter  Procedures   CBC   EKG 12-Lead   No orders of the defined types were placed in this encounter.   Patient Instructions  Medication Instructions:  Your physician recommends that you continue on your current medications as directed. Please refer to the Current Medication list given to you today.  *If you need a refill on your cardiac medications before your next appointment, please call your pharmacy*   Lab Work: Your physician recommends that you return for lab work TODAY:  CBC If you have labs (blood work) drawn today and your tests are completely normal, you will receive your results only by: MyChart Message (if you have MyChart) OR A paper copy in the mail If you have any lab test that is abnormal or we need to change your treatment, we will call you to review the results.   Testing/Procedures: NONE ordered at this time of appointment    Follow-Up: At [facility], you and your health needs are our priority.  As part of our continuing mission to provide you with exceptional heart care, we have created designated Provider Care Teams.  These Care Teams include your primary Cardiologist (physician) and Advanced Practice  Providers (APPs -  Physician Assistants and Nurse Practitioners) who all work together to provide you with the care you need, when you need it.  We recommend signing up for the patient portal called "MyChart".  Sign up information is provided on this After Visit Summary.  MyChart is used to connect with patients for Virtual Visits (Telemedicine).  Patients are able to view lab/test results, encounter notes, upcoming appointments, etc.  Non-urgent messages can be sent to your provider as well.   To learn more about what you can do with MyChart, go to [email or web address].    Your next appointment:   3-4 month(s)  The format for your next appointment:   In Person  Provider:   Jodelle Red, MD  Other Instructions    Signed, Azalee Course, PA  03/28/2021 4:20 PM    [facility]

## 2021-03-27 NOTE — Patient Instructions (Signed)
Medication Instructions:  Your physician recommends that you continue on your current medications as directed. Please refer to the Current Medication list given to you today.  *If you need a refill on your cardiac medications before your next appointment, please call your pharmacy*   Lab Work: Your physician recommends that you return for lab work TODAY:  CBC If you have labs (blood work) drawn today and your tests are completely normal, you will receive your results only by: MyChart Message (if you have MyChart) OR A paper copy in the mail If you have any lab test that is abnormal or we need to change your treatment, we will call you to review the results.   Testing/Procedures: NONE ordered at this time of appointment    Follow-Up: At [facility], you and your health needs are our priority.  As part of our continuing mission to provide you with exceptional heart care, we have created designated Provider Care Teams.  These Care Teams include your primary Cardiologist (physician) and Advanced Practice Providers (APPs -  Physician Assistants and Nurse Practitioners) who all work together to provide you with the care you need, when you need it.  We recommend signing up for the patient portal called "MyChart".  Sign up information is provided on this After Visit Summary.  MyChart is used to connect with patients for Virtual Visits (Telemedicine).  Patients are able to view lab/test results, encounter notes, upcoming appointments, etc.  Non-urgent messages can be sent to your provider as well.   To learn more about what you can do with MyChart, go to [email or web address].    Your next appointment:   3-4 month(s)  The format for your next appointment:   In Person  Provider:   Jodelle Red, MD  Other Instructions

## 2021-03-28 ENCOUNTER — Encounter: Payer: Self-pay | Admitting: Physician Assistant

## 2021-03-28 LAB — CBC
Hematocrit: 36.9 % (ref 34.0–46.6)
Hemoglobin: 12 g/dL (ref 11.1–15.9)
MCH: 29.4 pg (ref 26.6–33.0)
MCHC: 32.5 g/dL (ref 31.5–35.7)
MCV: 90 fL (ref 79–97)
Platelets: 143 10*3/uL — ABNORMAL LOW (ref 150–450)
RBC: 4.08 x10E6/uL (ref 3.77–5.28)
RDW: 12.6 % (ref 11.7–15.4)
WBC: 5 10*3/uL (ref 3.4–10.8)

## 2021-03-28 NOTE — Progress Notes (Signed)
Red blood cell count normal on the current blood thinner

## 2021-04-04 ENCOUNTER — Inpatient Hospital Stay: Payer: Medicare Other | Admitting: Neurology

## 2021-06-25 ENCOUNTER — Ambulatory Visit (HOSPITAL_BASED_OUTPATIENT_CLINIC_OR_DEPARTMENT_OTHER): Payer: Medicare Other | Admitting: Cardiology

## 2021-06-26 ENCOUNTER — Inpatient Hospital Stay: Payer: Medicare Other | Admitting: Neurology

## 2021-12-12 ENCOUNTER — Ambulatory Visit (HOSPITAL_BASED_OUTPATIENT_CLINIC_OR_DEPARTMENT_OTHER): Payer: Medicare Other | Admitting: Cardiology

## 2021-12-12 ENCOUNTER — Encounter (HOSPITAL_BASED_OUTPATIENT_CLINIC_OR_DEPARTMENT_OTHER): Payer: Self-pay | Admitting: Cardiology

## 2021-12-12 VITALS — BP 102/60 | HR 65 | Ht 66.0 in | Wt 186.4 lb

## 2021-12-12 DIAGNOSIS — I10 Essential (primary) hypertension: Secondary | ICD-10-CM | POA: Diagnosis not present

## 2021-12-12 DIAGNOSIS — I4821 Permanent atrial fibrillation: Secondary | ICD-10-CM | POA: Diagnosis not present

## 2021-12-12 DIAGNOSIS — Z8673 Personal history of transient ischemic attack (TIA), and cerebral infarction without residual deficits: Secondary | ICD-10-CM

## 2021-12-12 DIAGNOSIS — I252 Old myocardial infarction: Secondary | ICD-10-CM | POA: Diagnosis not present

## 2021-12-12 DIAGNOSIS — Z7901 Long term (current) use of anticoagulants: Secondary | ICD-10-CM

## 2021-12-12 MED ORDER — AMLODIPINE BESYLATE 5 MG PO TABS: 5.0000 mg | ORAL_TABLET | Freq: Every day | ORAL | 3 refills | Status: DC

## 2021-12-12 NOTE — Patient Instructions (Addendum)
Medication Instructions: ?Blood pressure is low, we are decreasing the amlodipine from 10 mg to 5 mg daily. Goal blood pressure is 110s-120s/60s-70s.   ? ?Labwork: ?NONE  ? ?Testing/Procedures: ?NONE ? ?Follow-Up: ?Your physician wants you to follow-up in: 1 YEAR You will receive a reminder letter in the mail two months in advance. If you don't receive a letter, please call our office to schedule the follow-up appointment. ? ?

## 2021-12-12 NOTE — Progress Notes (Signed)
?Cardiology Office Note:   ? ?Date:  12/18/2021  ? ?ID:  Ana Franco, DOB 1926-04-15, MRN [id number] ? ?PCP:  Haywood Pao, MD  ?Cardiologist:  Buford Dresser, MD PhD ? ?Referring MD: Haywood Pao, MD  ? ?CC: follow up ? ?History of Present Illness:   ? ?Ana Franco is a 88 y.o. female with a hx of hypertension, hyperlipidemia, prior amputation (transtibial on the left) due to poor wound health who is seen for follow up today. I initially met her in 2019 as a new patient for the evaluation and management of hypertension. ? ?CV history: ?she suffered a fall while staying at the beach in [place] in 02/2018. She had an ankle fracture, which was treated with open reduction and internal fixation. Unfortunately, her course was complicated by troponin elevation consistent with NSTEMI; did not pursue cardiac catheterization. Did have echocardiogram, which patient and her son report as normal. Unfortunately she had complications with resistant MRSA infection. She had hardware removal on 03/31/18, and ultimately she underwent transtibial amputation on 04/10/18. ? ?She also has a history of anemia, with transfusion during recent hospitalization. She has a history of paroxysmal atrial fibrillation but was not anticoagulated in the past due to fall risk. Follows with Dr. Osborne Casco, has been on full dose aspirin per his recommendation for some time (at least 2015). No history of CVA. Unclear if full dose ASA was in place of anticoagulation for atrial fibrillation. ? ?Today: ?I have not seen her since her initial visit in 2019. She was last seen by Almyra Deforest on 03/27/21. She had a CVA in 12/2020 and was also found to be in afib. Started on apixaban.  ? ?Has occasional neck pain/popping.  ? ?Recovered well from her prior stroke. No falls in almost a year.  ? ?Had low blood pressure, had medication changed a few weeks ago per the son. ? ?Denies chest pain, shortness of breath at rest or with normal exertion. No PND,  orthopnea, LE edema or unexpected weight gain. No syncope or palpitations.  ? ?Past Medical History:  ?Diagnosis Date  ? Anemia   ? vitamin b 12 deficiency anemia  ? 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 Chronic venous insufficiency   ? Depression   ? Difficulty in walking   ? Displaced trimalleolar fracture of left lower leg   ? Essential (primary) hypertension   ? GERD (gastroesophageal reflux disease)   ? History of blood transfusion   ? 02/2018   ? History of falling   ? Hyperlipidemia   ? Hyperlipidemia   ? Hypertension   ? Infection and inflammatory reaction due to other internal orthopedic prosthetic devices, implants and grafts, subsequent encounter   ? Iron deficiency   ? Major depressive disorder   ? Muscle weakness (generalized)   ? Myocardial infarction [facility])   ? nonstemi mi - 02/19/2018 at [facility] after ankle fracture   ? Nutritional deficiency, unspecified   ? Obesity   ? Other lack of coordination   ? Unspecified atrial fibrillation ([place])   ? Unspecified osteoarthritis, unspecified site   ? ? ?Past Surgical History:  ?Procedure Laterality Date  ? AMPUTATION Left 04/10/2018  ? Procedure: LEFT BELOW KNEE AMPUTATION;  Surgeon: Newt Minion, MD;  Location: [place];  Service: Orthopedics;  Laterality: Left;  ? APPENDECTOMY    ? APPLICATION OF WOUND VAC Left 03/31/2018  ? Procedure: APPLICATION OF WOUND VAC;  Surgeon: Percell Miller,  Ernesta Amble, MD;  Location: [facility];  Service: Orthopedics;  Laterality: Left;  ? HARDWARE REMOVAL Left 03/31/2018  ? Procedure: HARDWARE REMOVAL;  Surgeon: Renette Butters, MD;  Location: [facility];  Service: Orthopedics;  Laterality: Left;  ? IRRIGATION AND DEBRIDEMENT FOOT Left 03/31/2018  ? Procedure: IRRIGATION AND DEBRIDEMENT OF LEFT ANKLE;  Surgeon: Renette Butters, MD;  Location: [facility];  Service: Orthopedics;  Laterality: Left;  ? ORIF ANKLE FRACTURE Right 06/13/2014  ? Procedure: OPEN REDUCTION INTERNAL FIXATION (ORIF) BIMALLEOLAR  ANKLE FRACTURE;  Surgeon: Ninetta Lights, MD;  Location: [place];  Service: Orthopedics;  Laterality: Right;  ? ? ?Current Medications: ?Current Outpatient Medications on File Prior to Visit  ?Medication Sig  ? acetaminophen (TYLENOL) 325 MG tablet Take 325 mg by mouth every 6 (six) hours as needed for headache (pain).  ? apixaban (ELIQUIS) 5 MG TABS tablet Take 1 tablet (5 mg total) by mouth 2 (two) times daily.  ? beta carotene 25000 UNIT capsule Take by mouth daily at 12 noon.  ? beta carotene w/minerals (OCUVITE) tablet Take 1 tablet by mouth daily.   ? CALCIUM 600/VITAMIN D 600-10 MG-MCG CHEW Chew 1 tablet by mouth daily.  ? cholecalciferol (VITAMIN D3) 25 MCG (1000 UNIT) tablet Take 1,000 Units by mouth at bedtime.  ? citalopram (CELEXA) 20 MG tablet Take 20 mg by mouth daily.  ? Cyanocobalamin (VITAMIN B-12 CR) 1000 MCG TBCR Take 1 tablet by mouth daily.  ? esomeprazole (NEXIUM) 40 MG capsule Take 40 mg by mouth daily.   ? ezetimibe-simvastatin (VYTORIN) 10-20 MG tablet Take 1 tablet by mouth daily.  ? gabapentin (NEURONTIN) 100 MG capsule Take 100 mg by mouth 2 (two) times daily.  ? metoprolol tartrate (LOPRESSOR) 50 MG tablet Take 50 mg by mouth 2 (two) times daily.   ? nitroGLYCERIN (NITROSTAT) 0.4 MG SL tablet Place 0.4 mg under the tongue every 5 (five) minutes as needed for chest pain.  ? polyethylene glycol (MIRALAX / GLYCOLAX) 17 g packet Take 17 g by mouth daily as needed for mild constipation.  ? ?No current facility-administered medications on file prior to visit.  ?  ? ?Allergies:   Patient has no known allergies.  ? ?Social History  ? ?Tobacco Use  ? Smoking status: Never  ? Smokeless tobacco: Never  ?Vaping Use  ? Vaping Use: Never used  ?Substance Use Topics  ? Alcohol use: No  ? Drug use: No  ?  ? ? ?Family History: ?No significant family history of cardiac disease. ? ?ROS:   ?Please see the history of present illness.  Additional pertinent ROS otherwise unremarkable. ? ?EKGs/Labs/Other Studies  Reviewed:   ? ?The following studies were reviewed today: ?Echo 02/21/18 ?  ?Summary   ?There is mild concentric left ventricular hypertrophy seen. There is   ?normal left ventricular size. Global left ventricular systolic function is   ?normal. The estimated left ventricular ejection fraction is ~59%. No   ?segmental wall motion abnormalities are appreciated.   ?The left atrium is severely enlarged. LA ESV index is 59.9ml/m2.The right   ?atrium is mildly enlarged.   ?There is moderate mitral annular calcification with mild thickening of the   ?mitral valve leaflets. Moderate mitral regurgitation is present.   ?Mild sclerosis of the trileaflet aortic valve, with adequate leaflet   ?motion. Trivial aortic regurgitation is present.   ?Moderate tricuspid regurgitation is seen. The pulmonary artery pressure   ?could not be estimated from the TR jet due  to technical limitations.  ? ?EKG:  EKG is personally reviewed today.   ?03/27/21: atrial fibrillation, rate controlled ? ?Recent Labs: ?01/06/2021: ALT 17; Magnesium 2.0; TSH 1.672 ?01/08/2021: BUN 25; Creatinine, Ser 1.13; Potassium 3.6; Sodium 136 ?03/27/2021: Hemoglobin 12.0; Platelets 143  ?Recent Lipid Panel ?   ?Component Value Date/Time  ? CHOL 95 01/07/2021 0126  ? TRIG 52 01/07/2021 0126  ? HDL 41 01/07/2021 0126  ? CHOLHDL 2.3 01/07/2021 0126  ? VLDL 10 01/07/2021 0126  ? [place] 44 01/07/2021 0126  ? ? ?Physical Exam:   ? ?VS:  BP 102/60 (BP Location: Right Arm, Patient Position: Sitting, Cuff Size: Large)   Pulse 65   Ht $R'5\' 6"'SF$  (1.676 m)   Wt 186 lb 6.4 oz (84.6 kg)   BMI 30.09 kg/m?    ? ?Wt Readings from Last 3 Encounters:  ?12/12/21 186 lb 6.4 oz (84.6 kg)  ?03/28/21 172 lb 3.2 oz (78.1 kg)  ?01/06/21 170 lb 13.7 oz (77.5 kg)  ?  ?GEN: Well nourished, well developed in no acute distress ?HEENT: Normal, moist mucous membranes ?NECK: No JVD ?CARDIAC: irregularly irregular rhythm, normal S1 and S2, no rubs or gallops. 2/6 systolic murmur. ?VASCULAR: Radial and  DP pulses 2+ bilaterally. No carotid bruits ?RESPIRATORY:  Clear to auscultation without rales, wheezing or rhonchi  ?ABDOMEN: Soft, non-tender, non-distended ?MUSCULOSKELETAL:  moves all 4 limbs independ

## 2021-12-18 DIAGNOSIS — I4821 Permanent atrial fibrillation: Secondary | ICD-10-CM | POA: Insufficient documentation

## 2021-12-18 DIAGNOSIS — I252 Old myocardial infarction: Secondary | ICD-10-CM | POA: Insufficient documentation

## 2021-12-18 DIAGNOSIS — Z8673 Personal history of transient ischemic attack (TIA), and cerebral infarction without residual deficits: Secondary | ICD-10-CM | POA: Insufficient documentation

## 2022-02-21 ENCOUNTER — Telehealth: Payer: Self-pay | Admitting: Cardiology

## 2022-02-21 NOTE — Telephone Encounter (Signed)
Pt c/o medication issue:  1. Name of Medication: amLODipine (NORVASC) 5 MG tablet  2. How are you currently taking this medication (dosage and times per day)?   3. Are you having a reaction (difficulty breathing--STAT)?   4. What is your medication issue?  [facility] - [facility] is calling to get clarification of this medication from the orders from 05/03 appt. Also sending a fax and requesting a call back.

## 2022-02-21 NOTE — Telephone Encounter (Signed)
RN returned call to [facility] from [facility]-    Per Dr. Cristal Deer note on 5/3 amlodipine was decreased from 10mg  to 5mg  due to low blood pressures     Beth states that their DON saw on a paper to stop Amlodipine if BP <100, I do not see this noted in her office visit forwarding to Dr. for Clarification.   Per Beth the patient's BP has only gotten as low as 102

## 2022-02-22 ENCOUNTER — Encounter (HOSPITAL_BASED_OUTPATIENT_CLINIC_OR_DEPARTMENT_OTHER): Payer: Self-pay

## 2022-02-22 NOTE — Telephone Encounter (Signed)
Letter printed and faxed to facility

## 2022-02-22 NOTE — Telephone Encounter (Signed)
Reviewed. Recommendation was to stop amlodipine completely if blood pressure remained less than 100 systolic. As this has not occurred, ok to continue amlodipine. If two consecutive daily readings are less than 100 systolic, would stop amlodipine and not restart until she is seen in the office.

## 2022-03-20 ENCOUNTER — Encounter (HOSPITAL_COMMUNITY): Payer: Self-pay | Admitting: Emergency Medicine

## 2022-03-20 ENCOUNTER — Emergency Department (HOSPITAL_COMMUNITY)
Admission: EM | Admit: 2022-03-20 | Discharge: 2022-03-20 | Disposition: A | Discharge status: Skilled Nursing Facility | Payer: Medicare Other | Attending: Emergency Medicine | Admitting: Emergency Medicine

## 2022-03-20 ENCOUNTER — Emergency Department (HOSPITAL_COMMUNITY): Payer: Medicare Other

## 2022-03-20 ENCOUNTER — Other Ambulatory Visit: Payer: Self-pay

## 2022-03-20 DIAGNOSIS — S61411A Laceration without foreign body of right hand, initial encounter: Secondary | ICD-10-CM | POA: Insufficient documentation

## 2022-03-20 DIAGNOSIS — S81811A Laceration without foreign body, right lower leg, initial encounter: Secondary | ICD-10-CM | POA: Insufficient documentation

## 2022-03-20 DIAGNOSIS — S0181XA Laceration without foreign body of other part of head, initial encounter: Secondary | ICD-10-CM | POA: Insufficient documentation

## 2022-03-20 DIAGNOSIS — W19XXXA Unspecified fall, initial encounter: Secondary | ICD-10-CM | POA: Diagnosis not present

## 2022-03-20 DIAGNOSIS — Z7901 Long term (current) use of anticoagulants: Secondary | ICD-10-CM | POA: Insufficient documentation

## 2022-03-20 DIAGNOSIS — Z79899 Other long term (current) drug therapy: Secondary | ICD-10-CM | POA: Insufficient documentation

## 2022-03-20 DIAGNOSIS — Z23 Encounter for immunization: Secondary | ICD-10-CM | POA: Diagnosis not present

## 2022-03-20 DIAGNOSIS — S41112A Laceration without foreign body of left upper arm, initial encounter: Secondary | ICD-10-CM | POA: Insufficient documentation

## 2022-03-20 DIAGNOSIS — Y92129 Unspecified place in nursing home as the place of occurrence of the external cause: Secondary | ICD-10-CM | POA: Diagnosis not present

## 2022-03-20 DIAGNOSIS — T148XXA Other injury of unspecified body region, initial encounter: Secondary | ICD-10-CM

## 2022-03-20 DIAGNOSIS — I10 Essential (primary) hypertension: Secondary | ICD-10-CM | POA: Insufficient documentation

## 2022-03-20 DIAGNOSIS — R000 Tachycardia, unspecified: Secondary | ICD-10-CM | POA: Diagnosis not present

## 2022-03-20 MED ORDER — TETANUS-DIPHTH-ACELL PERTUSSIS 5-2.5-18.5 LF-MCG/0.5 IM SUSY
0.5000 mL | PREFILLED_SYRINGE | Freq: Once | INTRAMUSCULAR | Status: AC
  Administered 2022-03-20: 0.5 mL via INTRAMUSCULAR
  Filled 2022-03-20: qty 0.5

## 2022-03-20 NOTE — ED Provider Notes (Signed)
[facility] Provider Note   CSN: [id number] Arrival date & time: 03/20/22  [id number]     History  No chief complaint on file.   Marijo SanesDorothy Waszak is a [age] y.o. female.  The history is provided by the patient, medical records and the EMS personnel. No language interpreter was used.  Fall This is a new problem. The current episode started less than 1 hour ago. The problem occurs rarely. Associated symptoms include headaches. Pertinent negatives include no chest pain, no abdominal pain and no shortness of breath. Nothing aggravates the symptoms. Nothing relieves the symptoms. She has tried nothing for the symptoms. The treatment provided no relief.       Home Medications Prior to Admission medications   Medication Sig Start Date End Date Taking? Authorizing Provider  acetaminophen (TYLENOL) 325 MG tablet Take 325 mg by mouth every 6 (six) hours as needed for headache (pain).    [provider]  amLODipine (NORVASC) 5 MG tablet Take 1 tablet (5 mg total) by mouth daily. 12/12/21 12/07/22  Jodelle Redhristopher, Bridgette, MD  apixaban (ELIQUIS) 5 MG TABS tablet Take 1 tablet (5 mg total) by mouth 2 (two) times daily. 01/09/21   Tyrone NineGrunz, Ryan B, MD  beta carotene [id number] UNIT capsule Take by mouth daily at 12 noon. 12/07/21   [provider]  beta carotene w/minerals (OCUVITE) tablet Take 1 tablet by mouth daily.     [provider]  CALCIUM 600/VITAMIN D 600-10 MG-MCG CHEW Chew 1 tablet by mouth daily. 11/20/21   [provider]  cholecalciferol (VITAMIN D3) 25 MCG (1000 UNIT) tablet Take 1,000 Units by mouth at bedtime.    [provider]  citalopram (CELEXA) 20 MG tablet Take 20 mg by mouth daily.    [provider]  Cyanocobalamin (VITAMIN B-12 CR) 1000 MCG TBCR Take 1 tablet by mouth daily. 11/30/21   [provider]  esomeprazole (NEXIUM) 40 MG capsule Take 40 mg by mouth daily.     [provider]   ezetimibe-simvastatin (VYTORIN) 10-20 MG tablet Take 1 tablet by mouth daily. 11/17/21   [provider]  gabapentin (NEURONTIN) 100 MG capsule Take 100 mg by mouth 2 (two) times daily. 11/28/21   [provider]  metoprolol tartrate (LOPRESSOR) 50 MG tablet Take 50 mg by mouth 2 (two) times daily.     [provider]  nitroGLYCERIN (NITROSTAT) 0.4 MG SL tablet Place 0.4 mg under the tongue every 5 (five) minutes as needed for chest pain.    [provider]  polyethylene glycol (MIRALAX / GLYCOLAX) 17 g packet Take 17 g by mouth daily as needed for mild constipation. 01/09/21   Tyrone NineGrunz, Ryan B, MD      Allergies    Patient has no known allergies.    Review of Systems   Review of Systems  Constitutional:  Negative for chills, fatigue and fever.  HENT:  Negative for congestion.   Eyes:  Negative for visual disturbance.  Respiratory:  Negative for cough, chest tightness and shortness of breath.   Cardiovascular:  Negative for chest pain.  Gastrointestinal:  Negative for abdominal pain, constipation, diarrhea, nausea and vomiting.  Genitourinary:  Negative for dysuria and flank pain.  Musculoskeletal:  Positive for back pain (chronic repoerted) and neck pain (chronic reported). Negative for neck stiffness.  Skin:  Positive for wound. Negative for rash.  Neurological:  Positive for headaches. Negative for dizziness, seizures, weakness, light-headedness and numbness.  Psychiatric/Behavioral:  Negative  for agitation and confusion.   All other systems reviewed and are negative.   Physical Exam Updated Vital Signs BP (!) 170/64   Pulse 70   Temp 97.8 F (36.6 C) (Oral)   Resp (!) 22   SpO2 97%  Physical Exam Vitals and nursing note reviewed.  Constitutional:      General: She is not in acute distress.    Appearance: She is well-developed. She is not ill-appearing, toxic-appearing or diaphoretic.  HENT:     Head: Normocephalic and atraumatic.     Nose:  No congestion.     Mouth/Throat:     Pharynx: No oropharyngeal exudate or posterior oropharyngeal erythema.  Eyes:     Extraocular Movements: Extraocular movements intact.     Conjunctiva/sclera: Conjunctivae normal.     Pupils: Pupils are equal, round, and reactive to light.  Cardiovascular:     Rate and Rhythm: Regular rhythm. Tachycardia present.     Heart sounds: No murmur heard. Pulmonary:     Effort: Pulmonary effort is normal. No respiratory distress.     Breath sounds: Normal breath sounds. No wheezing, rhonchi or rales.  Chest:     Chest wall: No tenderness.  Abdominal:     General: Abdomen is flat.     Palpations: Abdomen is soft.     Tenderness: There is no abdominal tenderness. There is no right CVA tenderness, left CVA tenderness, guarding or rebound.  Musculoskeletal:        General: Tenderness and signs of injury present. No swelling.     Cervical back: Neck supple. No tenderness.     Comments: Patient has skin tears to her left arm, right shin, and left forehead.  She also has laceration/skin tears to her right hand on the index finger and between her second and third knuckles.  Minimal bleeding at this time.  Good pulses, strength, sensation, and capillary refill.   Skin:    General: Skin is warm and dry.     Capillary Refill: Capillary refill takes less than 2 seconds.     Findings: No erythema.  Neurological:     General: No focal deficit present.     Mental Status: She is alert.     Sensory: No sensory deficit.     Motor: No weakness.  Psychiatric:        Mood and Affect: Mood normal.     ED Results / Procedures / Treatments   Labs (all labs ordered are listed, but only abnormal results are displayed) Labs Reviewed - No data to display  EKG None  Radiology DG Hand Complete Right  Result Date: 03/20/2022 CLINICAL DATA:  Fall, index finger laceration EXAM: RIGHT HAND - COMPLETE 3+ VIEW COMPARISON:  None Available. FINDINGS: Diffuse osseous  demineralization. Severe osteoarthritic changes of the distal interphalangeal joints, most pronounced at the index finger. No acute fracture is identified. There appears to be a old healed fracture deformity at the distal radius. Soft tissue swelling and irregularity of the index finger with overlying bandage material, likely reported laceration. No radiopaque foreign body. IMPRESSION: 1. No acute fracture of the right hand. 2. Soft tissue swelling and irregularity of the index finger, likely reported laceration. 3. Severe osteoarthritic changes of the distal interphalangeal joints. Electronically Signed   By: Duanne Guess D.O.   On: 03/20/2022 09:10   DG Lumbar Spine 2-3 Views  Result Date: 03/20/2022 CLINICAL DATA:  Fall, trauma EXAM: LUMBAR SPINE - 2-3 VIEW COMPARISON:  None Available. FINDINGS: Dextroconvex lower  lumbar curvature. There are age-indeterminate compression deformities of L2 and L4, with approximately 25% height loss, no recent priors for comparison. There is also mild anterior wedging of L1. There is moderate to severe multilevel degenerative disc disease and lower lumbar predominant facet arthropathy. Vascular calcifications. Pelvic phleboliths. IMPRESSION: Age-indeterminate compression deformities of L2 and L4 with approximately 25% height loss, no recent priors for comparison. There is also mild anterior wedging of L1, which is potentially physiologic. Moderate-severe multilevel degenerative disc disease and lower lumbar predominant facet arthropathy. Electronically Signed   By: Caprice Renshaw M.D.   On: 03/20/2022 09:09   CT HEAD WO CONTRAST ( )  Result Date: 03/20/2022 CLINICAL DATA:  Larey Seat.  Hit head. EXAM: CT HEAD WITHOUT CONTRAST CT CERVICAL SPINE WITHOUT CONTRAST TECHNIQUE: Multidetector CT imaging of the head and cervical spine was performed following the standard protocol without intravenous contrast. Multiplanar CT image reconstructions of the cervical spine were also generated.  RADIATION DOSE REDUCTION: This exam was performed according to the departmental dose-optimization program which includes automated exposure control, adjustment of the mA and/or kV according to patient size and/or use of iterative reconstruction technique. COMPARISON:  Brain MRI 01/06/2021 FINDINGS: CT HEAD FINDINGS Brain: Stable age related cerebral atrophy, ventriculomegaly and periventricular white matter disease. No extra-axial fluid collections are identified. No CT findings for acute hemispheric infarction or intracranial hemorrhage. No mass lesions. The brainstem and cerebellum are normal. Vascular: Vascular calcifications but no aneurysm or hyperdense vessels. Skull: No acute skull fracture.  No bone lesions. Sinuses/Orbits: The paranasal sinuses and mastoid air cells are clear. The globes are intact. Other: Frontal scalp hematoma but no underlying skull fracture. CT CERVICAL SPINE FINDINGS Alignment: Normal overall alignment of the cervical vertebral bodies. Skull base and vertebrae: No acute cervical spine fracture. Stable severe degenerative changes at C1-2. The skull base C1 articulations are maintained. Advanced facet disease with fused facet joints in the upper and mid cervical spine. Soft tissues and spinal canal: Moderate pannus formation at C1-2 with significant narrowing of the ventral CSF space in front of the upper cervical spine. This is stable when compared to prior MRI. No prevertebral fluid or swelling. No visible canal hematoma. Disc levels: The cervical spinal canal is quite generous. No large disc protrusions or significant canal stenosis. Mild multilevel foraminal stenosis. Upper chest: The lung apices are grossly clear. Other: No neck mass or adenopathy. Bilateral carotid artery calcifications are noted. IMPRESSION: 1. Frontal scalp hematoma without underlying skull fracture. 2. Stable age related cerebral atrophy, ventriculomegaly and periventricular white matter disease. No acute  intracranial findings. 3. No acute cervical spine fracture. 4. Stable severe C1-2 degenerative changes with extensive pannus formation. Electronically Signed   By: Rudie Meyer M.D.   On: 03/20/2022 09:04   CT Cervical Spine Wo Contrast  Result Date: 03/20/2022 CLINICAL DATA:  Larey Seat.  Hit head. EXAM: CT HEAD WITHOUT CONTRAST CT CERVICAL SPINE WITHOUT CONTRAST TECHNIQUE: Multidetector CT imaging of the head and cervical spine was performed following the standard protocol without intravenous contrast. Multiplanar CT image reconstructions of the cervical spine were also generated. RADIATION DOSE REDUCTION: This exam was performed according to the departmental dose-optimization program which includes automated exposure control, adjustment of the mA and/or kV according to patient size and/or use of iterative reconstruction technique. COMPARISON:  Brain MRI 01/06/2021 FINDINGS: CT HEAD FINDINGS Brain: Stable age related cerebral atrophy, ventriculomegaly and periventricular white matter disease. No extra-axial fluid collections are identified. No CT findings for acute hemispheric infarction or intracranial hemorrhage.  No mass lesions. The brainstem and cerebellum are normal. Vascular: Vascular calcifications but no aneurysm or hyperdense vessels. Skull: No acute skull fracture.  No bone lesions. Sinuses/Orbits: The paranasal sinuses and mastoid air cells are clear. The globes are intact. Other: Frontal scalp hematoma but no underlying skull fracture. CT CERVICAL SPINE FINDINGS Alignment: Normal overall alignment of the cervical vertebral bodies. Skull base and vertebrae: No acute cervical spine fracture. Stable severe degenerative changes at C1-2. The skull base C1 articulations are maintained. Advanced facet disease with fused facet joints in the upper and mid cervical spine. Soft tissues and spinal canal: Moderate pannus formation at C1-2 with significant narrowing of the ventral CSF space in front of the upper  cervical spine. This is stable when compared to prior MRI. No prevertebral fluid or swelling. No visible canal hematoma. Disc levels: The cervical spinal canal is quite generous. No large disc protrusions or significant canal stenosis. Mild multilevel foraminal stenosis. Upper chest: The lung apices are grossly clear. Other: No neck mass or adenopathy. Bilateral carotid artery calcifications are noted. IMPRESSION: 1. Frontal scalp hematoma without underlying skull fracture. 2. Stable age related cerebral atrophy, ventriculomegaly and periventricular white matter disease. No acute intracranial findings. 3. No acute cervical spine fracture. 4. Stable severe C1-2 degenerative changes with extensive pannus formation. Electronically Signed   By: Rudie Meyer M.D.   On: 03/20/2022 09:04    Procedures .[name] KitchenLaceration Repair  Date/Time: 03/20/2022 11:17 AM  Performed by: Heide Scales, MD Authorized by: Heide Scales, MD   Consent:    Consent obtained:  Verbal   Consent given by:  Patient   Risks, benefits, and alternatives were discussed: yes     Risks discussed:  Infection, pain, poor cosmetic result and poor wound healing   Alternatives discussed:  No treatment Universal protocol:    Imaging studies available: yes     Immediately prior to procedure, a time out was called: yes     Patient identity confirmed:  Verbally with patient and arm band Anesthesia:    Anesthesia method:  None Laceration details:    Location:  Hand   Hand location:  R hand, dorsum   Length (cm):  2 Pre-procedure details:    Preparation:  Patient was prepped and draped in usual sterile fashion and imaging obtained to evaluate for foreign bodies Exploration:    Limited defect created (wound extended): no     Hemostasis achieved with:  Direct pressure   Imaging outcome: foreign body not noted     Wound exploration: wound explored through full range of motion and entire depth of wound visualized      Contaminated: no   Treatment:    Area cleansed with:  Saline   Amount of cleaning:  Standard Skin repair:    Repair method:  Sutures   Suture size:  4-0   Suture material:  Prolene   Suture technique:  Simple interrupted   Number of sutures:  2 Approximation:    Approximation:  Loose Repair type:    Repair type:  Simple Post-procedure details:    Dressing:  Antibiotic ointment and non-adherent dressing   Procedure completion:  Tolerated     Medications Ordered in ED Medications  Tdap (BOOSTRIX) injection 0.5 mL (0.5 mLs Intramuscular Given 03/20/22 0902)    ED Course/ Medical Decision Making/ A&P                           Medical  Decision Making Amount and/or Complexity of Data Reviewed Radiology: ordered.  Risk Prescription drug management.    Robena Ewy is a 88 y.o. female with a past medical history significant for atrial fibrillation on Eliquis therapy, hypertension, GERD, left BKA, and hyperlipidemia who presents as a level 2 trauma for fall on blood thinners.  According to patient and EMS, patient had mechanical fall at her facility this morning where she hit her right shin, left elbow, right hand, and left forehead on the ground.  Patient did not lose consciousness and is complaining of very minimal headache.  She also reports minimal neck soreness.  She reports she has had back soreness for months and it may be slightly more sore today but does not critically so.  She does not report pain in her hand elbow, or shin but had them wrapped up due to bleeding.  She denies any preceding symptoms otherwise is feeling well.  Vital signs reassuring aside from hypertension per EMS.  On arrival, airway is intact.  Breath sounds equal bilaterally.  Patient moving all extremities.  Patient's wounds were examined patient does have abrasion and skin tear to her left forehead.  Patient has abrasion/skin tear to her right shin and her left upper arm just proximal to her elbow.   Those are all very small.  Patient has 2 slightly larger skin tears to her right hand.  She is right-handed and we will get an x-ray to look for any bony injury with it.  Will update her tetanus.  Will get the CTs of her head and neck, lumbar x-ray, and hand x-ray and then determine best management for wound management.  We will need to washout and examine her hand wounds to see if it would be amenable to sutures given her fragile skin versus some sort of other dressing.  Given her lack of any preceding symptoms we had a shared decision-making conversation and agreed to hold on diagnostic labs as this was a mechanical fall.  9:41 AM CT imaging returned without any acute skull fracture or cervical fracture.  She does have some cervical degenerative changes we discussed.  Hand x-ray showed soft tissue changes but otherwise no evidence of bony injury.  Lumbar x-ray showed age-indeterminate compression injuries and given the patient's report of chronic pain preceding her fall I suspect these are all older injuries.  No large injury seen.  Patient's wounds were reexamined and are primarily skin tears.  Will have her wound on her forehead, left arm, and shin washed and dressed with skin tear dressings.  For her right hand, it does appear that the injury on the knuckles is slightly deeper so we are going to try and wash it out and gently put a single stitch in to help keep it approximated.  I suspect the tissue was very friable and may not hold but we will likely dress the other wound with some Steri-Strips.  Anticipate splinting her hand in a position to prevent tension on the area and close PCP follow-up.  Anticipate discharge after wound treatment.   Patient had laceration repaired by me with 2 nonabsorbable sutures to help with the skin tear between the knuckles.  I then covered it with bacitracin after cleaning and use Steri-Strips on the more distal wound.  It was then covered with Xeroform gauze and  wrapped.  A splint was placed so she could not bend it and ripped them out.  She will follow-up with PCP for reexamination and suture removal in  a week.  Patient and family agree with plan of care and return precautions.  There are no other questions or concerns and patient discharged in good condition after management.        Final Clinical Impression(s) / ED Diagnoses Final diagnoses:  Fall, initial encounter  Multiple skin tears  Laceration of right hand without foreign body, initial encounter    Rx / DC Orders ED Discharge Orders     None       Clinical Impression: 1. Fall, initial encounter   2. Multiple skin tears   3. Laceration of right hand without foreign body, initial encounter     Disposition: Discharge  Condition: Good  I have discussed the results, Dx and Tx plan with the pt(& family if present). He/she/they expressed understanding and agree(s) with the plan. Discharge instructions discussed at great length. Strict return precautions discussed and pt &/or family have verbalized understanding of the instructions. No further questions at time of discharge.    New Prescriptions   No medications on file    Follow Up: Gaspar Garbe, MD [address] [place] [address] [phone]     [facility] EMERGENCY DEPARTMENT [address] [id number] mc [place] [address] [phone]         Charlese Gruetzmacher, Canary Brim, MD 03/20/22 1434

## 2022-03-20 NOTE — ED Notes (Signed)
Patient transported to X-ray by Florentina Addison, [employer]

## 2022-03-20 NOTE — Discharge Instructions (Signed)
Your history, exam, workup today revealed no acute traumatic bony injuries from the fall as your CT head and neck were reassuring.  He did have arthritis as we discussed and the bruising and hematoma on your forehead will likely remain for several days.  Your injuries to the skin or overall consistent with skin tears however the laceration to your right dorsum of the hand did need 2 stitches to try to keep it well-approximated.  Please leave the splint in place and try not to squeeze your hand.  Please follow-up with your primary doctor in the next week to reexamine and remove the stitches.  Please keep everything clean and dry.  Please watch for signs and symptoms of infection.  We did update your tetanus shot.  If any symptoms change or worsen, please return to the nearest emergency department.

## 2022-03-20 NOTE — ED Notes (Signed)
Trauma Response Nurse Documentation   Ana Franco is a 88 y.o. female arriving to [facility] ED via EMS  On Eliquis (apixaban) daily. Trauma was activated as a Level 2 by ED Charge RN based on the following trauma criteria Elderly patients > 65 with head trauma on anti-coagulation (excluding ASA). Patient cleared for CT by Dr. Rush Landmark. Patient to CT with team. 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)    Chronic venous insufficiency    Depression    Difficulty in walking    Displaced trimalleolar fracture of left lower leg    Essential (primary) hypertension    GERD (gastroesophageal reflux disease)    History of blood transfusion    02/2018    History of falling    Hyperlipidemia    Hyperlipidemia    Hypertension    Infection and inflammatory reaction due to other internal orthopedic prosthetic devices, implants and grafts, subsequent encounter    Iron deficiency    Major depressive disorder    Muscle weakness (generalized)    Myocardial infarction (HCC)    nonstemi mi - [facility]/19/2018 at [facility] after ankle fracture    Nutritional deficiency, unspecified    Obesity    Other lack of coordination    Unspecified atrial fibrillation (HCC)    Unspecified osteoarthritis, unspecified site      Past Surgical History:  Procedure Laterality Date   AMPUTATION Left 04/10/2018   Procedure: LEFT BELOW KNEE AMPUTATION;  Surgeon: Nadara Mustard, MD;  Location: [facility] OR;  Service: Orthopedics;  Laterality: Left;   APPENDECTOMY     APPLICATION OF WOUND VAC Left 03/31/2018   Procedure: APPLICATION OF WOUND VAC;  Surgeon: Sheral Apley, MD;  Location: [facility] ORS;  Service: Orthopedics;  Laterality: Left;   HARDWARE REMOVAL Left 03/31/2018   Procedure: HARDWARE REMOVAL;  Surgeon: Sheral Apley, MD;  Location: [facility] ORS;  Service: Orthopedics;  Laterality:  Left;   IRRIGATION AND DEBRIDEMENT FOOT Left 03/31/2018   Procedure: IRRIGATION AND DEBRIDEMENT OF LEFT ANKLE;  Surgeon: Sheral Apley, MD;  Location: [facility] ORS;  Service: Orthopedics;  Laterality: Left;   ORIF ANKLE FRACTURE Right 06/13/2014   Procedure: OPEN REDUCTION INTERNAL FIXATION (ORIF) BIMALLEOLAR ANKLE FRACTURE;  Surgeon: Loreta Ave, MD;  Location: [facility] OR;  Service: Orthopedics;  Laterality: Right;      Initial Focused Assessment (If applicable, or please see trauma documentation): - GCS 15 - A/O x4 - PERRLA - Large abrasion to L forehead w/ hematoma - Bruising noted around pt's left eye - Skin tear/ lacs to L FA and R fingers. - Abrasions to R leg - VS WDL  CT's Completed:   CT Head and CT C-Spine   Interventions:  - labs drawn - CT head and neck - XR to R hand and lumbar spine  Plan for disposition:  Other Unsure at this time.  Consults completed:  none at 0900.  Event Summary: Pt was BIB [facility] after a mechanical fall.  Pt is from [facility].  Pt does take eliquis for a hx of stroke and a-fib.  Pt states she was getting up too fast from sitting and lost her balance resulting in falling through her bedside table.  She did hit her head but had no LOC.   Bedside handoff with ED RN [place].  Clovis Cao  Trauma Response RN  Please call TRN at [phone] for further assistance.

## 2022-03-20 NOTE — ED Notes (Signed)
Patient transported to CT by this RN 

## 2022-03-20 NOTE — Progress Notes (Signed)
Orthopedic Tech Progress Note Patient Details:  Ana Franco October 04, 1925 [id number]  Ortho Devices Type of Ortho Device: Finger splint, Volar splint Ortho Device/Splint Location: RUE Ortho Device/Splint Interventions: Ordered, Application, Adjustment   Post Interventions Patient Tolerated: Well Instructions Provided: Care of device  [name] 03/20/2022, 11:28 AM

## 2022-03-20 NOTE — ED Notes (Signed)
Ortho tech at bedside 

## 2022-03-20 NOTE — ED Triage Notes (Signed)
Pt BIB [facility] from [facility] due to mechanical fall.  Pt states she was getting up from sitting and lost her balance in which she fell through her bedside table.  Hit her head with no LOC.  Hematoma to left side of head, skin tear on left forearm and right hand fingers.  Some abrasions to right leg as well. Pt takes Eliquis. Hx of stroke and A-fib.  VS BP 130/84.

## 2022-03-20 NOTE — ED Notes (Signed)
PTAR  called.  ETA "2 hours plus"

## 2022-03-20 NOTE — Progress Notes (Signed)
Orthopedic Tech Progress Note Patient Details:  Ana Franco Feb 14, 1926 [id number]  Level 2 trauma  Patient ID: Ana Franco, female   DOB: [birth year], 88 y.o.   MRN: [id number]  [name] 03/20/2022, 8:22 AM

## 2022-04-09 ENCOUNTER — Other Ambulatory Visit: Payer: Self-pay | Admitting: *Deleted

## 2022-04-09 DIAGNOSIS — R6889 Other general symptoms and signs: Secondary | ICD-10-CM

## 2022-04-18 ENCOUNTER — Encounter: Payer: Medicare Other | Admitting: Vascular Surgery

## 2022-04-18 ENCOUNTER — Encounter (HOSPITAL_COMMUNITY): Payer: Medicare Other

## 2022-12-14 ENCOUNTER — Encounter (HOSPITAL_COMMUNITY): Payer: Self-pay

## 2022-12-14 ENCOUNTER — Observation Stay (HOSPITAL_BASED_OUTPATIENT_CLINIC_OR_DEPARTMENT_OTHER): Payer: Medicare Other

## 2022-12-14 ENCOUNTER — Observation Stay (HOSPITAL_COMMUNITY)
Admission: EM | Admit: 2022-12-14 | Discharge: 2022-12-16 | Disposition: A | Discharge status: Home / Self Care | Payer: Medicare Other | Attending: Family Medicine | Admitting: Family Medicine

## 2022-12-14 ENCOUNTER — Emergency Department (HOSPITAL_COMMUNITY): Payer: Medicare Other

## 2022-12-14 DIAGNOSIS — I509 Heart failure, unspecified: Secondary | ICD-10-CM

## 2022-12-14 DIAGNOSIS — E876 Hypokalemia: Secondary | ICD-10-CM | POA: Insufficient documentation

## 2022-12-14 DIAGNOSIS — N1832 Chronic kidney disease, stage 3b: Secondary | ICD-10-CM | POA: Diagnosis not present

## 2022-12-14 DIAGNOSIS — R2689 Other abnormalities of gait and mobility: Secondary | ICD-10-CM | POA: Insufficient documentation

## 2022-12-14 DIAGNOSIS — I10 Essential (primary) hypertension: Secondary | ICD-10-CM | POA: Diagnosis present

## 2022-12-14 DIAGNOSIS — E785 Hyperlipidemia, unspecified: Secondary | ICD-10-CM | POA: Diagnosis present

## 2022-12-14 DIAGNOSIS — I498 Other specified cardiac arrhythmias: Secondary | ICD-10-CM | POA: Diagnosis not present

## 2022-12-14 DIAGNOSIS — I5031 Acute diastolic (congestive) heart failure: Secondary | ICD-10-CM

## 2022-12-14 DIAGNOSIS — Z79899 Other long term (current) drug therapy: Secondary | ICD-10-CM | POA: Insufficient documentation

## 2022-12-14 DIAGNOSIS — E871 Hypo-osmolality and hyponatremia: Secondary | ICD-10-CM | POA: Diagnosis not present

## 2022-12-14 DIAGNOSIS — M6281 Muscle weakness (generalized): Secondary | ICD-10-CM | POA: Insufficient documentation

## 2022-12-14 DIAGNOSIS — I130 Hypertensive heart and chronic kidney disease with heart failure and stage 1 through stage 4 chronic kidney disease, or unspecified chronic kidney disease: Secondary | ICD-10-CM | POA: Diagnosis not present

## 2022-12-14 DIAGNOSIS — I2510 Atherosclerotic heart disease of native coronary artery without angina pectoris: Secondary | ICD-10-CM | POA: Insufficient documentation

## 2022-12-14 DIAGNOSIS — I4821 Permanent atrial fibrillation: Secondary | ICD-10-CM | POA: Diagnosis present

## 2022-12-14 DIAGNOSIS — D649 Anemia, unspecified: Secondary | ICD-10-CM | POA: Insufficient documentation

## 2022-12-14 DIAGNOSIS — R2681 Unsteadiness on feet: Secondary | ICD-10-CM | POA: Diagnosis not present

## 2022-12-14 DIAGNOSIS — R0602 Shortness of breath: Secondary | ICD-10-CM | POA: Diagnosis present

## 2022-12-14 DIAGNOSIS — Z7901 Long term (current) use of anticoagulants: Secondary | ICD-10-CM | POA: Insufficient documentation

## 2022-12-14 DIAGNOSIS — F32A Depression, unspecified: Secondary | ICD-10-CM | POA: Diagnosis present

## 2022-12-14 DIAGNOSIS — Z89512 Acquired absence of left leg below knee: Secondary | ICD-10-CM

## 2022-12-14 LAB — COMPREHENSIVE METABOLIC PANEL
ALT: 10 U/L (ref 0–44)
AST: 27 U/L (ref 15–41)
Albumin: 2.8 g/dL — ABNORMAL LOW (ref 3.5–5.0)
Alkaline Phosphatase: 75 U/L (ref 38–126)
Anion gap: 10 (ref 5–15)
BUN: 14 mg/dL (ref 8–23)
CO2: 22 mmol/L (ref 22–32)
Calcium: 8.7 mg/dL — ABNORMAL LOW (ref 8.9–10.3)
Chloride: 99 mmol/L (ref 98–111)
Creatinine, Ser: 1.16 mg/dL — ABNORMAL HIGH (ref 0.44–1.00)
GFR, Estimated: 44 mL/min — ABNORMAL LOW (ref >60)
Glucose, Bld: 107 mg/dL — ABNORMAL HIGH (ref 70–99)
Potassium: 4.3 mmol/L (ref 3.5–5.1)
Sodium: 131 mmol/L — ABNORMAL LOW (ref 135–145)
Total Bilirubin: 1 mg/dL (ref 0.3–1.2)
Total Protein: 5.8 g/dL — ABNORMAL LOW (ref 6.5–8.1)

## 2022-12-14 LAB — CBC WITH DIFFERENTIAL/PLATELET
Abs Immature Granulocytes: 0.02 10*3/uL (ref 0.00–0.07)
Basophils Absolute: 0 10*3/uL (ref 0.0–0.1)
Basophils Relative: 0 %
Eosinophils Absolute: 0.1 10*3/uL (ref 0.0–0.5)
Eosinophils Relative: 1 %
HCT: 28.6 % — ABNORMAL LOW (ref 36.0–46.0)
Hemoglobin: 9.4 g/dL — ABNORMAL LOW (ref 12.0–15.0)
Immature Granulocytes: 0 %
Lymphocytes Relative: 18 %
Lymphs Abs: 1.6 10*3/uL (ref 0.7–4.0)
MCH: 29.9 pg (ref 26.0–34.0)
MCHC: 32.9 g/dL (ref 30.0–36.0)
MCV: 91.1 fL (ref 80.0–100.0)
Monocytes Absolute: 1.2 10*3/uL — ABNORMAL HIGH (ref 0.1–1.0)
Monocytes Relative: 14 %
Neutro Abs: 5.9 10*3/uL (ref 1.7–7.7)
Neutrophils Relative %: 67 %
Platelets: 197 10*3/uL (ref 150–400)
RBC: 3.14 MIL/uL — ABNORMAL LOW (ref 3.87–5.11)
RDW: 14.7 % (ref 11.5–15.5)
WBC: 8.8 10*3/uL (ref 4.0–10.5)
nRBC: 0 % (ref 0.0–0.2)

## 2022-12-14 LAB — TROPONIN I (HIGH SENSITIVITY)
Troponin I (High Sensitivity): 9 ng/L (ref <18)
Troponin I (High Sensitivity): 9 ng/L (ref <18)

## 2022-12-14 LAB — ECHOCARDIOGRAM COMPLETE
Area-P 1/2: 3.99 cm2
Height: 68 in
MV M vel: 4.78 m/s
MV Peak grad: 91.4 mmHg
S' Lateral: 2.9 cm
Weight: 2720 oz

## 2022-12-14 LAB — PROCALCITONIN: Procalcitonin: <0.1 ng/mL

## 2022-12-14 LAB — BRAIN NATRIURETIC PEPTIDE: B Natriuretic Peptide: 486.5 pg/mL — ABNORMAL HIGH (ref 0.0–100.0)

## 2022-12-14 MED ORDER — POLYETHYLENE GLYCOL 3350 17 G PO PACK: 17.0000 g | PACK | Freq: Every day | ORAL | Status: DC | PRN

## 2022-12-14 MED ORDER — PROCHLORPERAZINE EDISYLATE 10 MG/2ML IJ SOLN: 5.0000 mg | Freq: Four times a day (QID) | INTRAMUSCULAR | Status: DC | PRN

## 2022-12-14 MED ORDER — GABAPENTIN 100 MG PO CAPS
100.0000 mg | ORAL_CAPSULE | Freq: Two times a day (BID) | ORAL | Status: DC
  Administered 2022-12-14 – 2022-12-15 (×4): 100 mg via ORAL
  Filled 2022-12-14 (×5): qty 1

## 2022-12-14 MED ORDER — FUROSEMIDE 10 MG/ML IJ SOLN
20.0000 mg | Freq: Once | INTRAMUSCULAR | Status: AC
  Administered 2022-12-14: 20 mg via INTRAVENOUS
  Filled 2022-12-14: qty 2

## 2022-12-14 MED ORDER — SODIUM CHLORIDE 0.9 % IV SOLN
1.0000 g | Freq: Once | INTRAVENOUS | Status: AC
  Administered 2022-12-14: 1 g via INTRAVENOUS
  Filled 2022-12-14: qty 10

## 2022-12-14 MED ORDER — APIXABAN 5 MG PO TABS
5.0000 mg | ORAL_TABLET | Freq: Two times a day (BID) | ORAL | Status: DC
  Administered 2022-12-14 – 2022-12-16 (×5): 5 mg via ORAL
  Filled 2022-12-14 (×5): qty 1

## 2022-12-14 MED ORDER — EZETIMIBE 10 MG PO TABS
10.0000 mg | ORAL_TABLET | Freq: Every day | ORAL | Status: DC
  Administered 2022-12-14 – 2022-12-15 (×2): 10 mg via ORAL
  Filled 2022-12-14 (×2): qty 1

## 2022-12-14 MED ORDER — FUROSEMIDE 10 MG/ML IJ SOLN
20.0000 mg | Freq: Two times a day (BID) | INTRAMUSCULAR | Status: AC
  Administered 2022-12-14 (×2): 20 mg via INTRAVENOUS
  Filled 2022-12-14 (×2): qty 2

## 2022-12-14 MED ORDER — EZETIMIBE-SIMVASTATIN 10-20 MG PO TABS: 1.0000 | ORAL_TABLET | Freq: Every day | ORAL | Status: DC

## 2022-12-14 MED ORDER — ENOXAPARIN SODIUM 40 MG/0.4ML IJ SOSY: 40.0000 mg | PREFILLED_SYRINGE | Freq: Every day | INTRAMUSCULAR | Status: DC

## 2022-12-14 MED ORDER — MELATONIN 5 MG PO TABS: 5.0000 mg | ORAL_TABLET | Freq: Every evening | ORAL | Status: DC | PRN

## 2022-12-14 MED ORDER — AZITHROMYCIN 250 MG PO TABS
500.0000 mg | ORAL_TABLET | Freq: Every day | ORAL | Status: DC
  Administered 2022-12-14: 500 mg via ORAL
  Filled 2022-12-14: qty 2

## 2022-12-14 MED ORDER — ACETAMINOPHEN 325 MG PO TABS: 650.0000 mg | ORAL_TABLET | Freq: Four times a day (QID) | ORAL | Status: DC | PRN

## 2022-12-14 MED ORDER — CITALOPRAM HYDROBROMIDE 10 MG PO TABS
10.0000 mg | ORAL_TABLET | Freq: Every day | ORAL | Status: DC
  Administered 2022-12-14 – 2022-12-16 (×3): 10 mg via ORAL
  Filled 2022-12-14 (×3): qty 1

## 2022-12-14 MED ORDER — PANTOPRAZOLE SODIUM 40 MG PO TBEC
40.0000 mg | DELAYED_RELEASE_TABLET | Freq: Every day | ORAL | Status: DC
  Administered 2022-12-14 – 2022-12-16 (×3): 40 mg via ORAL
  Filled 2022-12-14 (×3): qty 1

## 2022-12-14 MED ORDER — SIMVASTATIN 20 MG PO TABS
20.0000 mg | ORAL_TABLET | Freq: Every day | ORAL | Status: DC
  Administered 2022-12-14 – 2022-12-15 (×2): 20 mg via ORAL
  Filled 2022-12-14 (×2): qty 1

## 2022-12-14 NOTE — Consult Note (Signed)
Cardiology Admission History and Physical   Patient ID: Ana Franco MRN: [id number]; DOB: 02/24/1926   Admission date: 12/14/2022  PCP:  Gaspar Garbe, MD   [facility] Providers Cardiologist:  Jodelle Red, MD        Chief Complaint:  Dyspnea  Patient Profile:   Ana Franco is a 89 y.o. female with hypertension, hyperlipidemia, permanent atrial fibrillation on Eliquis, history of left transtibial amputation and history of NSTEMI who is being seen 12/14/2022 for the evaluation of worsening DOE.  History of Present Illness:   Ana Franco is a pleasant 89 year old female with past medical history of hypertension, hyperlipidemia, permanent atrial fibrillation on Eliquis, history of left transtibial amputation and history of NSTEMI.  In July 2019, patient [place] in [facility] and that resulted in a ankle fracture.  She was treated with ORIF, hospital course complicated by NSTEMI.  She opted not to pursue cardiac catheterization at the time.  Echocardiogram performed on 02/21/2018 showed moderate MR, trivial AI, moderate TR, EF 59%, mild LVH but no segmental wall motion abnormality, grade 2 DD, severely enlarged left atrium.  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  She was previously not anticoagulated due to fall risk.  Unfortunately she was admitted in May 2022 with CVA after presenting with right arm weakness and slurred speech with confusion.  MRI confirmed acute posterior left frontal ischemic CVA in the distribution consistent with embolic stroke.  Her aspirin was switched to Eliquis given prior history of atrial fibrillation.  MRA of the head and neck showed 40 to 50% right ICA stenosis, 5 mm left cavernous ICA aneurysm.  Echocardiogram at the time showed EF 60 to 65%, mild MR,  severe LAE and mild AI.  She was last seen by Dr. Cristal Deer in May 2023 at which time she was doing well.  She did suffered a fall in August 2023 after she was tripped by the bathroom door.  She had frontal scalp hematoma without underlying skull fracture or intracranial bleed.  She did require sutures for laceration to help with skin tear between knuckles.  Roughly 2 to 3 weeks ago, patient had pneumonia and shingles.  She had fever, chill, and productive cough with thick brown phlegm.  After she recovered from pneumonia, she continued to feel weak and has worsening dyspnea on exertion over the past 2 weeks.  She denies any significant leg edema or exertional chest pain. She still resides in [facility] assisted living facility.  She is not very active at baseline and mostly staying in he room.  She has not had any significant falls in the past few months.  She denies any bleeding issues such as blood in stool blood in the urine.  Patient also endorsed orthopnea but no PND.  Eventually, due to worsening symptoms, EMS was called.  On EMS arrival, she was saturating in the low 90s at rest on 2 L nasal cannula.  Per ED physician's note, she had diffuse Rales and wheezing on lung saltation.  BNP 486.  Creatinine 1.16.  Sodium 131.  Hemoglobin 9.4.  Previous hemoglobin from 2022 was 12.  EKG showed chronic atrial fibrillation, rate controlled.  Chest x-ray showed cardiomegaly with vascular congestion, right perihilar and lower lobe airspace opacity with questionable left perihilar opacity finding concerning for pneumonia or edema.  She was given  40 mg IV Lasix this morning and also ceftriaxone.  Cardiology service consulted for possible heart failure.   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)    Chronic venous insufficiency    Depression    Difficulty in walking    Displaced trimalleolar fracture of left  lower leg    Essential (primary) hypertension    GERD (gastroesophageal reflux disease)    History of blood transfusion    02/2018    History of falling    Hyperlipidemia    Hyperlipidemia    Hypertension    Infection and inflammatory reaction due to other internal orthopedic prosthetic devices, implants and grafts, subsequent encounter    Iron deficiency    Major depressive disorder    Muscle weakness (generalized)    Myocardial infarction (HCC)    nonstemi mi - 02/19/2018 at [facility] after ankle fracture    Nutritional deficiency, unspecified    Obesity    Other lack of coordination    Unspecified atrial fibrillation (HCC)    Unspecified osteoarthritis, unspecified site     Past Surgical History:  Procedure Laterality Date   AMPUTATION Left 04/10/2018   Procedure: LEFT BELOW KNEE AMPUTATION;  Surgeon: Nadara Mustard, MD;  Location: [facility] OR;  Service: Orthopedics;  Laterality: Left;   APPENDECTOMY     APPLICATION OF WOUND VAC Left 03/31/2018   Procedure: APPLICATION OF WOUND VAC;  Surgeon: Sheral Apley, MD;  Location: [facility] ORS;  Service: Orthopedics;  Laterality: Left;   HARDWARE REMOVAL Left 03/31/2018   Procedure: HARDWARE REMOVAL;  Surgeon: Sheral Apley, MD;  Location: [facility] ORS;  Service: Orthopedics;  Laterality: Left;   IRRIGATION AND DEBRIDEMENT FOOT Left 03/31/2018   Procedure: IRRIGATION AND DEBRIDEMENT OF LEFT ANKLE;  Surgeon: Sheral Apley, MD;  Location: [facility] ORS;  Service: Orthopedics;  Laterality: Left;   ORIF ANKLE FRACTURE Right 06/13/2014   Procedure: OPEN REDUCTION INTERNAL FIXATION (ORIF) BIMALLEOLAR ANKLE FRACTURE;  Surgeon: Loreta Ave, MD;  Location: [facility] OR;  Service: Orthopedics;  Laterality: Right;     Medications Prior to Admission: Prior to Admission medications   Medication Sig Start Date End Date Taking? Authorizing Provider  acetaminophen (TYLENOL) 325 MG tablet Take 325 mg by mouth every 6 (six) hours as needed for  headache (pain).    [provider]  amLODipine (NORVASC) 5 MG tablet Take 1 tablet (5 mg total) by mouth daily. 12/12/21 12/07/22  Jodelle Red, MD  apixaban (ELIQUIS) 5 MG TABS tablet Take 1 tablet (5 mg total) by mouth 2 (two) times daily. 01/09/21   Tyrone Nine, MD  beta carotene 40981 UNIT capsule Take by mouth daily at 12 noon. 12/07/21   [provider]  beta carotene w/minerals (OCUVITE) tablet Take 1 tablet by mouth daily.     [provider]  CALCIUM 600/VITAMIN D 600-10 MG-MCG CHEW Chew 1 tablet by mouth daily. 11/20/21   [provider]  cholecalciferol (VITAMIN D3) 25 MCG (1000 UNIT) tablet Take 1,000 Units by mouth at bedtime.    [provider]  citalopram (CELEXA) 20 MG tablet Take 20 mg by mouth daily.    [provider]  Cyanocobalamin (VITAMIN B-12 CR) 1000 MCG TBCR Take 1 tablet by mouth daily. 11/30/21   [provider]  esomeprazole (NEXIUM) 40 MG capsule Take 40 mg by mouth  daily.     [provider]  ezetimibe-simvastatin (VYTORIN) 10-20 MG tablet Take 1 tablet by mouth daily. 11/17/21   [provider]  gabapentin (NEURONTIN) 100 MG capsule Take 100 mg by mouth 2 (two) times daily. 11/28/21   [provider]  metoprolol tartrate (LOPRESSOR) 50 MG tablet Take 50 mg by mouth 2 (two) times daily.     [provider]  nitroGLYCERIN (NITROSTAT) 0.4 MG SL tablet Place 0.4 mg under the tongue every 5 (five) minutes as needed for chest pain.    [provider]  polyethylene glycol (MIRALAX / GLYCOLAX) 17 g packet Take 17 g by mouth daily as needed for mild constipation. 01/09/21   Tyrone Nine, MD     Allergies:   No Known Allergies  Social History:   Social History   Socioeconomic History   Marital status: Widowed    Spouse name: Not on file   Number of children: Not on file   Years of education: Not on file   Highest education level: Not on file  Occupational  History   Not on file  Tobacco Use   Smoking status: Never   Smokeless tobacco: Never  Vaping Use   Vaping Use: Never used  Substance and Sexual Activity   Alcohol use: No   Drug use: No   Sexual activity: Not Currently  Other Topics Concern   Not on file  Social History Narrative   Not on file   Social Determinants of Health   Financial Resource Strain: Not on file  Food Insecurity: Unknown (04/09/2018)   Hunger Vital Sign    Worried About Running Out of Food in the Last Year: Patient declined    Ran Out of Food in the Last Year: Patient declined  Transportation Needs: Unknown (04/09/2018)   PRAPARE - Administrator, Civil Service (Medical): Patient declined    Lack of Transportation (Non-Medical): Patient declined  Physical Activity: Not on file  Stress: Not on file  Social Connections: Not on file  Intimate Partner Violence: Not on file    Family History:   The patient's family history is not on file.    ROS:  Please see the history of present illness.  All other ROS reviewed and negative.     Physical Exam/Data:   Vitals:   12/14/22 0430 12/14/22 0445 12/14/22 0500 12/14/22 0515  BP: (!) 109/49 (!) 127/47 (!) 128/56 (!) 119/43  Pulse: (!) 52 (!) 57 (!) 56 (!) 54  Resp: 15 20 18 17   Temp:      TempSrc:      SpO2: 94% 96% 98% 94%  Weight:      Height:       No intake or output data in the 24 hours ending 12/14/22 0838    12/14/2022   12:29 AM 03/20/2022    8:15 AM 12/12/2021    3:13 PM  Last 3 Weights  Weight (lbs) 170 lb 186 lb 8.2 oz 186 lb 6.4 oz  Weight (kg) 77.111 kg 84.6 kg 84.55 kg     Body mass index is 25.85 kg/m.  General:  Well nourished, well developed, in no acute distress HEENT: normal Neck: no JVD Vascular: No carotid bruits; Distal pulses 2+ bilaterally   Cardiac: Irregularly irregular; no murmur  Lungs: Normal right basilar crackle Abd: soft, nontender, no hepatomegaly  Ext: no edema Musculoskeletal: Left BKA, no  significant right lower extremity edema Skin: warm and dry  Neuro:  CNs  2-12 intact, no focal abnormalities noted Psych:  Normal affect    EKG:  The ECG that was done and was personally reviewed and demonstrates  Atrial fibrillation, rate controlled, no significant ischemic changes.  Relevant CV Studies:  Echo 01/07/2021  1. Left ventricular ejection fraction, by estimation, is 60 to 65%. The  left ventricle has normal function. The left ventricle has no regional  wall motion abnormalities. There is moderate left ventricular hypertrophy.  Left ventricular diastolic function   could not be evaluated. Elevated left atrial pressure.   2. Right ventricular systolic function is normal. The right ventricular  size is normal. There is normal pulmonary artery systolic pressure.   3. Left atrial size was severely dilated.   4. The mitral valve is normal in structure. Mild mitral valve  regurgitation. No evidence of mitral stenosis. Moderate mitral annular  calcification.   5. The aortic valve is tricuspid. Aortic valve regurgitation is mild.  Mild aortic valve sclerosis is present, with no evidence of aortic valve  stenosis.   6. The inferior vena cava is dilated in size with >50% respiratory  variability, suggesting right atrial pressure of 8 mmHg.   Laboratory Data:  High Sensitivity Troponin:   Recent Labs  Lab 12/14/22 0047 12/14/22 0300  TROPONINIHS 9 9      Chemistry Recent Labs  Lab 12/14/22 0047  NA 131*  K 4.3  CL 99  CO2 22  GLUCOSE 107*  BUN 14  CREATININE 1.16*  CALCIUM 8.7*  GFRNONAA 44*  ANIONGAP 10    Recent Labs  Lab 12/14/22 0047  PROT 5.8*  ALBUMIN 2.8*  AST 27  ALT 10  ALKPHOS 75  BILITOT 1.0   Lipids No results for input(s): "CHOL", "TRIG", "HDL", "LABVLDL", "LDLCALC", "CHOLHDL" in the last 168 hours. Hematology Recent Labs  Lab 12/14/22 0047  WBC 8.8  RBC 3.14*  HGB 9.4*  HCT 28.6*  MCV 91.1  MCH 29.9  MCHC 32.9  RDW 14.7  PLT 197    Thyroid No results for input(s): "TSH", "FREET4" in the last 168 hours. BNP Recent Labs  Lab 12/14/22 0047  BNP 486.5*    DDimer No results for input(s): "DDIMER" in the last 168 hours.   Radiology/Studies:  DG Chest 2 View  Result Date: 12/14/2022 CLINICAL DATA:  Shortness of breath EXAM: CHEST - 2 VIEW COMPARISON:  12/14/2022 FINDINGS: Cardiomegaly, vascular congestion. Right suprahilar and right lower lobe airspace disease again noted. Questionable left perihilar opacity. No visible significant effusions or acute bony abnormality. IMPRESSION: Cardiomegaly with vascular congestion. Right perihilar and lower lobe airspace opacities with questionable left perihilar opacity. Findings concerning for pneumonia (favored) or edema. Electronically Signed   By: Charlett Nose M.D.   On: 12/14/2022 02:42   DG Chest Port 1 View  Result Date: 12/14/2022 CLINICAL DATA:  sob EXAM: PORTABLE CHEST 1 VIEW COMPARISON:  Chest x-ray 01/06/2021. FINDINGS: The heart and mediastinal contours are unchanged. Aortic calcification. Right upper lung zone and retrocardiac airspace opacity. No pulmonary edema. Possible trace pleural effusions. No pneumothorax. No acute osseous abnormality. IMPRESSION: 1. Low lung volumes with right upper lung zone and retrocardiac airspace opacity. Possible trace pleural effusions bilaterally. Recommend x-ray PA and lateral view of the chest with improved inspiratory effort for improved evaluation. 2.  Aortic Atherosclerosis (ICD10-I70.0). Electronically Signed   By: Tish Frederickson M.D.   On: 12/14/2022 01:26     Assessment and Plan:   Acute respiratory failure  -Admitted this morning due to  2 weeks of increasing shortness of breath with exertion and orthopnea after recovery from flu a and shingle.  - BNP elevated at 486.5.  Serial troponin negative.  EKG showed chronic atrial fibrillation without ischemic change.  Mildly anemic with hemoglobin 9.4.  Mildly hyponatremic with sodium  131.  Chest x-ray showed either right lower lobe pneumonia versus edema.  So far she is has received 40 mg IV Lasix this morning, and put out 600 cc of urine.  Her breathing is stable on 2 L nasal cannula.  On exam, she has minimal crackles in the right base of the lung.  Left  lung is clear.  She has no lower extremity edema on exam.  -Will discuss with MD, patient does not appears to be significantly volume overloaded based on physical exam. Will continue IV lasix 20mg  BID until we can get more info from Echo. Receiving antibiotic for possible pneumonia.  Pending echocardiogram to reassess ejection fraction.  Last echocardiogram in 12/2020 demonstrated EF 60 to 65% with mild MR.  Hypertension: Blood pressure well-controlled without antihypertensive medication.  Hyperlipidemia: On Vytorin at home  Permanent atrial fibrillation on Eliquis: Has been compliant with Eliquis.  No significant falls since August 2023.  Was seen in the ED in August 2023 for scalp hematoma after tripped by the bathroom door.  History of left transtibial amputation  History of NSTEMI: Occurred during admission for ankle fracture in July 2019 in [place].  Hospital course complicated by NSTEMI after ORIF, she opted not to undergo cardiac catheterization at the time.  Echocardiogram since has showed a normal ejection without wall motion abnormality.  H/o CVA   Risk Assessment/Risk Scores:       New York Heart Association (NYHA) Functional Class NYHA Class III  CHA2DS2-VASc Score = 7   This indicates a 11.2% annual risk of stroke. The patient's score is based upon: CHF History: 0 HTN History: 1 Diabetes History: 0 Stroke History: 2 Vascular Disease History: 1 Age Score: 2 Gender Score: 1      Severity of Illness: The appropriate patient status for this patient is OBSERVATION. Observation status is judged to be reasonable and necessary in order to provide the required intensity of service to  ensure the patient's safety. The patient's presenting symptoms, physical exam findings, and initial radiographic and laboratory data in the context of their medical condition is felt to place them at decreased risk for further clinical deterioration. Furthermore, it is anticipated that the patient will be medically stable for discharge from the hospital within 2 midnights of admission.    For questions or updates, please contact [facility] Please consult [email or web address] for contact info under     Ramond Dial, [place]  12/14/2022 8:38 AM

## 2022-12-14 NOTE — Progress Notes (Signed)
    Patient: Jaselyn Tonini WUJ:[id number] DOB: [birth year]      Brief hospital course: Mrs. Teffeteller is a 89 y.o. F with permAF on Eliquis, hx L BKA, CAD, HTN, hx TIA, HLD, and influenza 3 weeks ago who presented with 1 day increased SOB with exertion, found to have CHF.    This is a no charge note, for further details, please see the H&P by my partner, Dr. Margo Aye from earlier today.   Principal Problem:   Acute diastolic CHF (congestive heart failure) (HCC) Active Problems:   Essential hypertension, benign   Dyslipidemia   Depression   Permanent atrial fibrillation (HCC)   Hyponatremia   Chronic kidney disease CKD IIIb   Normocytic anemia   Coronary artery disease   Hx of BKA, left (HCC)    Continue IV Lasix Appreciate Cardiology assistance     Physical Exam: BP 136/61 (BP Location: Right Arm)   Pulse (!) 54   Temp 98.3 F (36.8 C) (Oral)   Resp 16   Ht 5\' 8"  (1.727 m)   Wt 77.1 kg   SpO2 94%   BMI 25.85 kg/m   Patient seen and examined.   Appears well.   Family Communication: Son by phone        Author: Alberteen Sam, MD 12/14/2022 3:37 PM

## 2022-12-14 NOTE — ED Triage Notes (Signed)
Patient was walking to her bathroom in her room and became [facility] while ambulating without her oxygen. Patient was DX with the Flu aprox 3 weeks ago and was placed on O2 at 2 L for D/C

## 2022-12-14 NOTE — H&P (Addendum)
History and Physical  Ana Franco YQM:[id number] DOB: [birth year] DOA: 12/14/2022  Referring physician: Dr. Madilyn Hook, EDP  PCP: Wylene Simmer Adelfa Koh, MD  Outpatient Specialists: Cardiology. Patient coming from: SNF   Chief Complaint: Shortness of breath.  HPI: Ana Franco is a 89 y.o. female with medical history significant for permanent A-fib on Eliquis, coronary artery disease, hyperlipidemia, left lower extremity infection status post left transtibial amputation, chronic anxiety/depression, CKD 3B, recent influenza A infection, now on 2 L nasal cannula, who presented to [facility] ED from SNF due to acute worsening of exertional dyspnea.  Endorses shortness of breath with minimal exertion for the past 2 weeks.  Worsened today to the point where she could not ambulate to her bathroom.  EMS was activated.  Upon EMS arrival, the patient was saturating in the low 90s at rest on her 2 L nasal cannula.  In the ED, on physical exam, diffuse rales and wheezing noted on lung auscultation, right JVD and right lower extremity edema also noted.  BNP elevated greater than 400.  Chest x-ray revealed the following findings:  Cardiomegaly with vascular congestion.  Right perihilar and lower lobe airspace opacities with questionable left perihilar opacity. Findings concerning for pneumonia (favored) or edema.   Due to concern for acute on chronic diastolic CHF the patient was started on IV Lasix.  She also received a dose of Rocephin and azithromycin since pneumonia could not be ruled out based on chest x-ray findings.  TRH, hospitalist service, was asked to admit.  The patient was admitted at [facility] telemetry cardiac unit as observation status.   ED Course: Tmax 98.3.  BP 129/69, pulse 67, respiratory 17, saturation 96% on 2 L.  Lab studies remarkable for BNP 486.  WBC 8.8.  Hemoglobin 9.4.  Serum sodium 131, serum glucose 107, creatinine 1.16 with GFR 44.  Troponin 9, repeat 9.  Review of  Systems: Review of systems as noted in the HPI. All other systems reviewed and are negative.   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)    Chronic venous insufficiency    Depression    Difficulty in walking    Displaced trimalleolar fracture of left lower leg    Essential (primary) hypertension    GERD (gastroesophageal reflux disease)    History of blood transfusion    02/2018    History of falling    Hyperlipidemia    Hyperlipidemia    Hypertension    Infection and inflammatory reaction due to other internal orthopedic prosthetic devices, implants and grafts, subsequent encounter    Iron deficiency    Major depressive disorder    Muscle weakness (generalized)    Myocardial infarction (HCC)    nonstemi mi - 02/19/2018 at [facility] after ankle fracture    Nutritional deficiency, unspecified    Obesity    Other lack of coordination    Unspecified atrial fibrillation (HCC)    Unspecified osteoarthritis, unspecified site    Past Surgical History:  Procedure Laterality Date   AMPUTATION Left 04/10/2018   Procedure: LEFT BELOW KNEE AMPUTATION;  Surgeon: Nadara Mustard, MD;  Location: [facility] OR;  Service: Orthopedics;  Laterality: Left;   APPENDECTOMY     APPLICATION OF WOUND VAC Left 03/31/2018   Procedure: APPLICATION OF WOUND VAC;  Surgeon: Sheral Apley, MD;  Location: [facility] ORS;  Service: Orthopedics;  Laterality: Left;   HARDWARE REMOVAL Left 03/31/2018   Procedure: HARDWARE REMOVAL;  Surgeon: Sheral Apley, MD;  Location: [facility] ORS;  Service: Orthopedics;  Laterality: Left;   IRRIGATION AND DEBRIDEMENT FOOT Left 03/31/2018   Procedure: IRRIGATION AND DEBRIDEMENT OF LEFT ANKLE;  Surgeon: Sheral Apley, MD;  Location: [facility] ORS;  Service: Orthopedics;  Laterality: Left;   ORIF ANKLE FRACTURE Right 06/13/2014   Procedure: OPEN REDUCTION INTERNAL FIXATION  (ORIF) BIMALLEOLAR ANKLE FRACTURE;  Surgeon: Loreta Ave, MD;  Location: [facility] OR;  Service: Orthopedics;  Laterality: Right;    Social History:  reports that she has never smoked. She has never used smokeless tobacco. She reports that she does not drink alcohol and does not use drugs.   No Known Allergies  Family history: None reported.  Prior to Admission medications   Medication Sig Start Date End Date Taking? Authorizing Provider  acetaminophen (TYLENOL) 325 MG tablet Take 325 mg by mouth every 6 (six) hours as needed for headache (pain).    [provider]  amLODipine (NORVASC) 5 MG tablet Take 1 tablet (5 mg total) by mouth daily. 12/12/21 12/07/22  Jodelle Red, MD  apixaban (ELIQUIS) 5 MG TABS tablet Take 1 tablet (5 mg total) by mouth 2 (two) times daily. 01/09/21   Tyrone Nine, MD  beta carotene 44010 UNIT capsule Take by mouth daily at 12 noon. 12/07/21   [provider]  beta carotene w/minerals (OCUVITE) tablet Take 1 tablet by mouth daily.     [provider]  CALCIUM 600/VITAMIN D 600-10 MG-MCG CHEW Chew 1 tablet by mouth daily. 11/20/21   [provider]  cholecalciferol (VITAMIN D3) 25 MCG (1000 UNIT) tablet Take 1,000 Units by mouth at bedtime.    [provider]  citalopram (CELEXA) 20 MG tablet Take 20 mg by mouth daily.    [provider]  Cyanocobalamin (VITAMIN B-12 CR) 1000 MCG TBCR Take 1 tablet by mouth daily. 11/30/21   [provider]  esomeprazole (NEXIUM) 40 MG capsule Take 40 mg by mouth daily.     [provider]  ezetimibe-simvastatin (VYTORIN) 10-20 MG tablet Take 1 tablet by mouth daily. 11/17/21   [provider]  gabapentin (NEURONTIN) 100 MG capsule Take 100 mg by mouth 2 (two) times daily. 11/28/21   [provider]  metoprolol tartrate (LOPRESSOR) 50 MG tablet Take 50 mg by mouth 2 (two) times daily.     [provider]  nitroGLYCERIN (NITROSTAT) 0.4  MG SL tablet Place 0.4 mg under the tongue every 5 (five) minutes as needed for chest pain.    [provider]  polyethylene glycol (MIRALAX / GLYCOLAX) 17 g packet Take 17 g by mouth daily as needed for mild constipation. 01/09/21   Tyrone Nine, MD    Physical Exam: BP (!) 128/55   Pulse (!) 57   Temp 98.3 F (36.8 C) (Oral)   Resp 19   Ht 5\' 8"  (1.727 m)   Wt 77.1 kg   SpO2 96%   BMI 25.85 kg/m   General: 89 y.o. year-old female well developed well nourished in no acute distress.  Alert and interactive. Cardiovascular: Regular rate and rhythm with no rubs or gallops.  No thyromegaly. R JVD noted.  1+ pitting edema in LE. Respiratory: Mild rales and diffuse wheezes.  Good inspiratory effort. Abdomen: Soft nontender nondistended with normal bowel sounds x4 quadrants. Muskuloskeletal: No cyanosis or clubbing noted bilaterally Neuro: CN II-XII intact, strength,  sensation, reflexes Skin: No ulcerative lesions noted or rashes Psychiatry: Judgement and insight appear normal. Mood is appropriate for condition and setting          Labs on Admission:  Basic Metabolic Panel: Recent Labs  Lab 12/14/22 0047  NA 131*  K 4.3  CL 99  CO2 22  GLUCOSE 107*  BUN 14  CREATININE 1.16*  CALCIUM 8.7*   Liver Function Tests: Recent Labs  Lab 12/14/22 0047  AST 27  ALT 10  ALKPHOS 75  BILITOT 1.0  PROT 5.8*  ALBUMIN 2.8*   No results for input(s): "LIPASE", "AMYLASE" in the last 168 hours. No results for input(s): "AMMONIA" in the last 168 hours. CBC: Recent Labs  Lab 12/14/22 0047  WBC 8.8  NEUTROABS 5.9  HGB 9.4*  HCT 28.6*  MCV 91.1  PLT 197   Cardiac Enzymes: No results for input(s): "CKTOTAL", "CKMB", "CKMBINDEX", "TROPONINI" in the last 168 hours.  BNP (last 3 results) Recent Labs    12/14/22 0047  BNP 486.5*    ProBNP (last 3 results) No results for input(s): "PROBNP" in the last 8760 hours.  CBG: No results for input(s): "GLUCAP" in the last  168 hours.  Radiological Exams on Admission: DG Chest 2 View  Result Date: 12/14/2022 CLINICAL DATA:  Shortness of breath EXAM: CHEST - 2 VIEW COMPARISON:  12/14/2022 FINDINGS: Cardiomegaly, vascular congestion. Right suprahilar and right lower lobe airspace disease again noted. Questionable left perihilar opacity. No visible significant effusions or acute bony abnormality. IMPRESSION: Cardiomegaly with vascular congestion. Right perihilar and lower lobe airspace opacities with questionable left perihilar opacity. Findings concerning for pneumonia (favored) or edema. Electronically Signed   By: Charlett Nose M.D.   On: 12/14/2022 02:42   DG Chest Port 1 View  Result Date: 12/14/2022 CLINICAL DATA:  sob EXAM: PORTABLE CHEST 1 VIEW COMPARISON:  Chest x-ray 01/06/2021. FINDINGS: The heart and mediastinal contours are unchanged. Aortic calcification. Right upper lung zone and retrocardiac airspace opacity. No pulmonary edema. Possible trace pleural effusions. No pneumothorax. No acute osseous abnormality. IMPRESSION: 1. Low lung volumes with right upper lung zone and retrocardiac airspace opacity. Possible trace pleural effusions bilaterally. Recommend x-ray PA and lateral view of the chest with improved inspiratory effort for improved evaluation. 2.  Aortic Atherosclerosis (ICD10-I70.0). Electronically Signed   By: Tish Frederickson M.D.   On: 12/14/2022 01:26    EKG: I independently viewed the EKG done and my findings are as followed: Atrial fibrillation rate of 59.  Nonspecific ST-T changes.  QTc 464.  Assessment/Plan Present on Admission:  Acute diastolic CHF (congestive heart failure) (HCC)  Principal Problem:   Acute diastolic CHF (congestive heart failure) (HCC)  Acute on chronic diastolic CHF Presented with progressively worsening exertional dyspnea Last 2D echo done on 01/07/2021 revealed LVEF 60 to 65% Repeat 2D echo on 12/14/22. Continue IV diuresing Start strict I's and O's and daily  weight Cardiology, Dr. Paulino Rily, consulted.  Acute on chronic hypoxic respiratory failure secondary to pulmonary edema Improved after IV Lasix At baseline on 2 L nasal cannula continuously. Wean off oxygen supplementation as tolerated Maintain O2 saturation greater than 90% As needed bronchodilators Incentive spirometer Early mobilization as tolerated.  Permanent atrial fibrillation, rate controlled Resume home Eliquis. Monitor on telemetry.  GERD Resume home PPI.  Hyperlipidemia Resume home regimen.  Chronic anxiety/depression Resume home regimen.  Physical debility PT OT assessment Fall precautions.  Polyneuropathy Status post left transtibial amputation Resume home gabapentin 100 mg twice daily Fall  precautions.  Possible acute urinary retention Obtain bladder scan In-N-Out cath x 1 if needed Closely monitor urine output.    DVT prophylaxis: Home Eliquis.  Code Status: DNR form in the room with the patient.  Family Communication: None at bedside  Disposition Plan: Admitted to telemetry cardiac unit  Consults called: Cardiology  Admission status: Observation status   Status is: Observation    Darlin Drop MD [facility] Pager [phone]  If 7PM-7AM, please contact night-coverage [email or web address] Password [email or web address]  12/14/2022, 4:14 AM

## 2022-12-14 NOTE — Progress Notes (Signed)
  Echocardiogram 2D Echocardiogram has been performed.  Ana Franco 12/14/2022, 2:28 PM

## 2022-12-14 NOTE — ED Provider Notes (Signed)
Ana Franco EMERGENCY DEPARTMENT AT [facility] Provider Note   CSN: [id number] Arrival date & time: 12/14/22  0018     History  Chief Complaint  Patient presents with   Shortness of Breath    Ana Franco is a 89 y.o. female.  The history is provided by the patient and medical records.  Shortness of Breath Ana Franco is a 89 y.o. female who presents to the Emergency Department complaining of this of breath.  She presents to the emergency department by EMS from her nursing facility for evaluation of shortness of breath.  She had the flu about 3 weeks ago and was started on nasal cannula 2 L oxygen at that time.  She states that over the last 3 to 4 days she has been slightly more short of breath although she has been short of breath since the flu.  She also reports a cough productive of brown sputum for the last 3 to 4 days.  No associated chest pain, fevers, nausea, vomiting, leg swelling or pain.  Nursing facility staff report that her sats were in the low 80s after ambulating from her bathroom to her bed and that is what prompted an EMS call.  EMS report normal sats on her home oxygen at the time of assessment.  No hematochezia or melena.  She has a history of paroxysmal atrial fibrillation, CKD, CVA on chronic anticoagulation with Eliquis.  She is also status post left lower extremity BKA following wound infection related to traumatic injury.     Home Medications Prior to Admission medications   Medication Sig Start Date End Date Taking? Authorizing Provider  acetaminophen (TYLENOL) 325 MG tablet Take 325 mg by mouth every 6 (six) hours as needed for headache (pain).    [provider]  amLODipine (NORVASC) 5 MG tablet Take 1 tablet (5 mg total) by mouth daily. 12/12/21 12/07/22  Jodelle Red, MD  apixaban (ELIQUIS) 5 MG TABS tablet Take 1 tablet (5 mg total) by mouth 2 (two) times daily. 01/09/21   Tyrone Nine, MD  beta carotene 40981 UNIT capsule  Take by mouth daily at 12 noon. 12/07/21   [provider]  beta carotene w/minerals (OCUVITE) tablet Take 1 tablet by mouth daily.     [provider]  CALCIUM 600/VITAMIN D 600-10 MG-MCG CHEW Chew 1 tablet by mouth daily. 11/20/21   [provider]  cholecalciferol (VITAMIN D3) 25 MCG (1000 UNIT) tablet Take 1,000 Units by mouth at bedtime.    [provider]  citalopram (CELEXA) 20 MG tablet Take 20 mg by mouth daily.    [provider]  Cyanocobalamin (VITAMIN B-12 CR) 1000 MCG TBCR Take 1 tablet by mouth daily. 11/30/21   [provider]  esomeprazole (NEXIUM) 40 MG capsule Take 40 mg by mouth daily.     [provider]  ezetimibe-simvastatin (VYTORIN) 10-20 MG tablet Take 1 tablet by mouth daily. 11/17/21   [provider]  gabapentin (NEURONTIN) 100 MG capsule Take 100 mg by mouth 2 (two) times daily. 11/28/21   [provider]  metoprolol tartrate (LOPRESSOR) 50 MG tablet Take 50 mg by mouth 2 (two) times daily.     [provider]  nitroGLYCERIN (NITROSTAT) 0.4 MG SL tablet Place 0.4 mg under the tongue every 5 (five) minutes as needed for chest pain.    [provider]  polyethylene glycol (MIRALAX / GLYCOLAX) 17 g packet Take 17 g by mouth daily as needed for mild  constipation. 01/09/21   Tyrone Nine, MD      Allergies    Patient has no known allergies.    Review of Systems   Review of Systems  Respiratory:  Positive for shortness of breath.   All other systems reviewed and are negative.   Physical Exam Updated Vital Signs BP (!) 127/47   Pulse (!) 57   Temp 98.3 F (36.8 C) (Oral)   Resp 20   Ht 5\' 8"  (1.727 m)   Wt 77.1 kg   SpO2 96%   BMI 25.85 kg/m  Physical Exam Vitals and nursing note reviewed.  Constitutional:      Appearance: She is well-developed.  HENT:     Head: Normocephalic and atraumatic.  Cardiovascular:     Rate and Rhythm: Normal rate and regular rhythm.      Heart sounds: No murmur heard. Pulmonary:     Effort: Pulmonary effort is normal. No respiratory distress.     Comments: Crackles in all lung fields, more pronounced in the lower lung fields. Abdominal:     Palpations: Abdomen is soft.     Tenderness: There is no abdominal tenderness. There is no guarding or rebound.  Musculoskeletal:        General: No tenderness.     Comments: 2+ right DP pulse.  Trace edema to the right lower extremity.  Left lower extremity with BKA  Skin:    General: Skin is warm and dry.  Neurological:     Mental Status: She is alert and oriented to person, place, and time.  Psychiatric:        Behavior: Behavior normal.     ED Results / Procedures / Treatments   Labs (all labs ordered are listed, but only abnormal results are displayed) Labs Reviewed  BRAIN NATRIURETIC PEPTIDE - Abnormal; Notable for the following components:      Result Value   B Natriuretic Peptide 486.5 (*)    All other components within normal limits  COMPREHENSIVE METABOLIC PANEL - Abnormal; Notable for the following components:   Sodium 131 (*)    Glucose, Bld 107 (*)    Creatinine, Ser 1.16 (*)    Calcium 8.7 (*)    Total Protein 5.8 (*)    Albumin 2.8 (*)    GFR, Estimated 44 (*)    All other components within normal limits  CBC WITH DIFFERENTIAL/PLATELET - Abnormal; Notable for the following components:   RBC 3.14 (*)    Hemoglobin 9.4 (*)    HCT 28.6 (*)    Monocytes Absolute 1.2 (*)    All other components within normal limits  TROPONIN I (HIGH SENSITIVITY)  TROPONIN I (HIGH SENSITIVITY)    EKG None  Radiology DG Chest 2 View  Result Date: 12/14/2022 CLINICAL DATA:  Shortness of breath EXAM: CHEST - 2 VIEW COMPARISON:  12/14/2022 FINDINGS: Cardiomegaly, vascular congestion. Right suprahilar and right lower lobe airspace disease again noted. Questionable left perihilar opacity. No visible significant effusions or acute bony abnormality. IMPRESSION: Cardiomegaly  with vascular congestion. Right perihilar and lower lobe airspace opacities with questionable left perihilar opacity. Findings concerning for pneumonia (favored) or edema. Electronically Signed   By: Charlett Nose M.D.   On: 12/14/2022 02:42   DG Chest Port 1 View  Result Date: 12/14/2022 CLINICAL DATA:  sob EXAM: PORTABLE CHEST 1 VIEW COMPARISON:  Chest x-ray 01/06/2021. FINDINGS: The heart and mediastinal contours are unchanged. Aortic calcification. Right upper lung zone and retrocardiac airspace opacity. No pulmonary  edema. Possible trace pleural effusions. No pneumothorax. No acute osseous abnormality. IMPRESSION: 1. Low lung volumes with right upper lung zone and retrocardiac airspace opacity. Possible trace pleural effusions bilaterally. Recommend x-ray PA and lateral view of the chest with improved inspiratory effort for improved evaluation. 2.  Aortic Atherosclerosis (ICD10-I70.0). Electronically Signed   By: Tish Frederickson M.D.   On: 12/14/2022 01:26    Procedures Procedures    Medications Ordered in ED Medications  cefTRIAXone (ROCEPHIN) 1 g in sodium chloride 0.9 % 100 mL IVPB (1 g Intravenous New Bag/Given 12/14/22 0444)  azithromycin (ZITHROMAX) tablet 500 mg (has no administration in time range)  enoxaparin (LOVENOX) injection 40 mg (has no administration in time range)  furosemide (LASIX) injection 20 mg (20 mg Intravenous Given 12/14/22 0308)  furosemide (LASIX) injection 20 mg (20 mg Intravenous Given 12/14/22 0444)    ED Course/ Medical Decision Making/ A&P                             Medical Decision Making Amount and/or Complexity of Data Reviewed Labs: ordered. Radiology: ordered.  Risk Prescription drug management. Decision regarding hospitalization.   Patient with history of A-fib on anticoagulation, CKD, CVA, left lower extremity BKA here for evaluation of shortness of breath.  She did have influenza about 3 to 4 weeks ago and has been on supplemental oxygen since  then.  She reports increased shortness of breath, cough and dyspnea on exertion for several days but today she had profound symptoms and she was found to have sats in the low 80s on ambulation from her bathroom to her bed.  She has diffuse crackles on examination but no respiratory distress.  Chest x-ray with questionable pneumonia-images personally reviewed and interpreted, agree with radiologist interpretation..  She does not have a fever nor leukocytosis.  Suspect that her symptoms are more likely to be secondary to CHF given her lung exam as well as her elevated BNP but will treat with antibiotics as well for possible pneumonia given her recent influenza.  She was initially treated with a dose of furosemide 20 mg with no urine output, a second 20 mg dose was given.  Given her significant symptoms with ambulation plan to admit for observation and ongoing care.  Medicine consulted for admission for ongoing treatment.  Patient is in agreement with plan.        Final Clinical Impression(s) / ED Diagnoses Final diagnoses:  Acute congestive heart failure, unspecified heart failure type [facility])    Rx / DC Orders ED Discharge Orders     None         Tilden Fossa, MD 12/14/22 [phone]

## 2022-12-14 NOTE — Hospital Course (Addendum)
Ana Franco is a 89 y.o. F with permAF on Eliquis, hx L BKA, CAD, HTN, hx TIA, HLD, and influenza 3 weeks ago who presented with 1 day increased SOB with exertion, found to have CHF.

## 2022-12-14 NOTE — ED Notes (Signed)
Patient transported to the floor by ER Tech, portable cardiac monitor in place

## 2022-12-14 NOTE — ED Notes (Signed)
ED TO INPATIENT HANDOFF REPORT  ED Nurse Name and Phone #: [phone]  S Name/Age/Gender Ana Franco 89 y.o. female Room/Bed: 028C/028C  Code Status   Code Status: Full Code  Home/SNF/Other Skilled nursing facility Patient oriented to: self, place, time, and situation Is this baseline? Yes   Triage Complete: Triage complete  Chief Complaint Acute diastolic CHF (congestive heart failure) (HCC) [I50.31]  Triage Note Patient was walking to her bathroom in her room and became [facility] while ambulating without her oxygen. Patient was DX with the Flu aprox 3 weeks ago and was placed on O2 at 2 L for D/C   Allergies No Known Allergies  Level of Care/Admitting Diagnosis ED Disposition     ED Disposition  Admit   Condition  --   Comment  Hospital Area: [facility] [[id number]]  Level of Care: Telemetry Cardiac [[id number]]  May place patient in observation at [facility] or [facility] if equivalent level of care is available:: No  Covid Evaluation: Asymptomatic - no recent exposure (last 10 days) testing not required  Diagnosis: Acute diastolic CHF (congestive heart failure) [facility]) [[id number]]  Admitting Physician: Darlin Drop [[id number]]  Attending Physician: Darlin Drop [[id number]]          B Medical/Surgery History 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)    Chronic venous insufficiency    Depression    Difficulty in walking    Displaced trimalleolar fracture of left lower leg    Essential (primary) hypertension    GERD (gastroesophageal reflux disease)    History of blood transfusion    02/2018    History of falling    Hyperlipidemia    Hyperlipidemia    Hypertension    Infection and inflammatory reaction due to other internal orthopedic prosthetic devices, implants and grafts, subsequent encounter    Iron deficiency    Major depressive disorder     Muscle weakness (generalized)    Myocardial infarction (HCC)    nonstemi mi - 02/19/2018 at [facility] after ankle fracture    Nutritional deficiency, unspecified    Obesity    Other lack of coordination    Unspecified atrial fibrillation (HCC)    Unspecified osteoarthritis, unspecified site    Past Surgical History:  Procedure Laterality Date   AMPUTATION Left 04/10/2018   Procedure: LEFT BELOW KNEE AMPUTATION;  Surgeon: Nadara Mustard, MD;  Location: [facility] OR;  Service: Orthopedics;  Laterality: Left;   APPENDECTOMY     APPLICATION OF WOUND VAC Left 03/31/2018   Procedure: APPLICATION OF WOUND VAC;  Surgeon: Sheral Apley, MD;  Location: [facility] ORS;  Service: Orthopedics;  Laterality: Left;   HARDWARE REMOVAL Left 03/31/2018   Procedure: HARDWARE REMOVAL;  Surgeon: Sheral Apley, MD;  Location: [facility] ORS;  Service: Orthopedics;  Laterality: Left;   IRRIGATION AND DEBRIDEMENT FOOT Left 03/31/2018   Procedure: IRRIGATION AND DEBRIDEMENT OF LEFT ANKLE;  Surgeon: Sheral Apley, MD;  Location: [facility] ORS;  Service: Orthopedics;  Laterality: Left;   ORIF ANKLE FRACTURE Right 06/13/2014   Procedure: OPEN REDUCTION INTERNAL FIXATION (ORIF) BIMALLEOLAR ANKLE FRACTURE;  Surgeon: Loreta Ave, MD;  Location: [facility] OR;  Service: Orthopedics;  Laterality: Right;     A IV Location/Drains/Wounds Patient Lines/Drains/Airways Status     Active Line/Drains/Airways     Name Placement date Placement time  Site Days   Peripheral IV 12/14/22 18 G Posterior;Right Hand 12/14/22  0020  Hand  less than 1   Negative Pressure Wound Therapy Leg Left 04/10/18  0808  --  1709   External Urinary Catheter 04/04/18  0700  --  1715   External Urinary Catheter 01/08/21  --  --  705   External Urinary Catheter 12/14/22  0310  --  less than 1   Incision (Closed) 06/13/14 Leg Right 06/13/14  1518  -- 3106   Incision (Closed) 03/31/18 Leg Left 03/31/18  1250  -- 1719   Incision (Closed) 04/10/18 Leg  Left 04/10/18  0813  -- 1709            Intake/Output Last 24 hours No intake or output data in the 24 hours ending 12/14/22 0437  Labs/Imaging Results for orders placed or performed during the hospital encounter of 12/14/22 (from the past 48 hour(s))  Brain natriuretic peptide     Status: Abnormal   Collection Time: 12/14/22 12:47 AM  Result Value Ref Range   B Natriuretic Peptide 486.5 (H) 0.0 - 100.0 pg/mL    Comment: Performed at [facility], [address], [place], [place] [address]  Comprehensive metabolic panel     Status: Abnormal   Collection Time: 12/14/22 12:47 AM  Result Value Ref Range   Sodium 131 (L) 135 - 145 mmol/L   Potassium 4.3 3.5 - 5.1 mmol/L   Chloride 99 98 - 111 mmol/L   CO2 22 22 - 32 mmol/L   Glucose, Bld 107 (H) 70 - 99 mg/dL    Comment: Glucose reference range applies only to samples taken after fasting for at least 8 hours.   BUN 14 8 - 23 mg/dL   Creatinine, Ser 1.30 (H) 0.44 - 1.00 mg/dL   Calcium 8.7 (L) 8.9 - 10.3 mg/dL   Total Protein 5.8 (L) 6.5 - 8.1 g/dL   Albumin 2.8 (L) 3.5 - 5.0 g/dL   AST 27 15 - 41 U/L   ALT 10 0 - 44 U/L   Alkaline Phosphatase 75 38 - 126 U/L   Total Bilirubin 1.0 0.3 - 1.2 mg/dL   GFR, Estimated 44 (L) >60 mL/min    Comment: (NOTE) Calculated using the CKD-EPI Creatinine Equation (2021)    Anion gap 10 5 - 15    Comment: Performed at [facility], [address], [place], [place] [address]  CBC with Differential     Status: Abnormal   Collection Time: 12/14/22 12:47 AM  Result Value Ref Range   WBC 8.8 4.0 - 10.5 K/uL   RBC 3.14 (L) 3.87 - 5.11 MIL/uL   Hemoglobin 9.4 (L) 12.0 - 15.0 g/dL   HCT 46.9 (L) 62.9 - 52.8 %   MCV 91.1 80.0 - 100.0 fL   MCH 29.9 26.0 - 34.0 pg   MCHC 32.9 30.0 - 36.0 g/dL   RDW 41.3 24.4 - 01.0 %   Platelets 197 150 - 400 K/uL   nRBC 0.0 0.0 - 0.2 %   Neutrophils Relative % 67 %   Neutro Abs 5.9 1.7 - 7.7 K/uL   Lymphocytes Relative 18 %   Lymphs Abs 1.6 0.7  - 4.0 K/uL   Monocytes Relative 14 %   Monocytes Absolute 1.2 (H) 0.1 - 1.0 K/uL   Eosinophils Relative 1 %   Eosinophils Absolute 0.1 0.0 - 0.5 K/uL   Basophils Relative 0 %   Basophils Absolute 0.0 0.0 -  0.1 K/uL   Immature Granulocytes 0 %   Abs Immature Granulocytes 0.02 0.00 - 0.07 K/uL    Comment: Performed at [facility], [address] [place], [place] [address]  Troponin I (High Sensitivity)     Status: None   Collection Time: 12/14/22 12:47 AM  Result Value Ref Range   Troponin I (High Sensitivity) 9 <18 ng/L    Comment: (NOTE) Elevated high sensitivity troponin I (hsTnI) values and significant  changes across serial measurements may suggest ACS but many other  chronic and acute conditions are known to elevate hsTnI results.  Refer to the "Links" section for chest pain algorithms and additional  guidance. Performed at [facility], [address] [place], [place] [address]   Troponin I (High Sensitivity)     Status: None   Collection Time: 12/14/22  3:00 AM  Result Value Ref Range   Troponin I (High Sensitivity) 9 <18 ng/L    Comment: (NOTE) Elevated high sensitivity troponin I (hsTnI) values and significant  changes across serial measurements may suggest ACS but many other  chronic and acute conditions are known to elevate hsTnI results.  Refer to the "Links" section for chest pain algorithms and additional  guidance. Performed at [facility], [address], [place], [place] [address]    DG Chest 2 View  Result Date: 12/14/2022 CLINICAL DATA:  Shortness of breath EXAM: CHEST - 2 VIEW COMPARISON:  12/14/2022 FINDINGS: Cardiomegaly, vascular congestion. Right suprahilar and right lower lobe airspace disease again noted. Questionable left perihilar opacity. No visible significant effusions or acute bony abnormality. IMPRESSION: Cardiomegaly with vascular congestion. Right perihilar and lower lobe airspace opacities with questionable left perihilar  opacity. Findings concerning for pneumonia (favored) or edema. Electronically Signed   By: Charlett Nose M.D.   On: 12/14/2022 02:42   DG Chest Port 1 View  Result Date: 12/14/2022 CLINICAL DATA:  sob EXAM: PORTABLE CHEST 1 VIEW COMPARISON:  Chest x-ray 01/06/2021. FINDINGS: The heart and mediastinal contours are unchanged. Aortic calcification. Right upper lung zone and retrocardiac airspace opacity. No pulmonary edema. Possible trace pleural effusions. No pneumothorax. No acute osseous abnormality. IMPRESSION: 1. Low lung volumes with right upper lung zone and retrocardiac airspace opacity. Possible trace pleural effusions bilaterally. Recommend x-ray PA and lateral view of the chest with improved inspiratory effort for improved evaluation. 2.  Aortic Atherosclerosis (ICD10-I70.0). Electronically Signed   By: Tish Frederickson M.D.   On: 12/14/2022 01:26    Pending Labs Unresulted Labs (From admission, onward)     Start     Ordered   12/21/22 0500  Creatinine, serum  (enoxaparin (LOVENOX)    CrCl >/= 30 ml/min)  Weekly,   R     Comments: while on enoxaparin therapy    12/14/22 0414            Vitals/Pain Today's Vitals   12/14/22 0215 12/14/22 0245 12/14/22 0300 12/14/22 0315  BP: (!) 123/46 139/61 (!) 142/84 (!) 128/55  Pulse: (!) 55 (!) 57 64 (!) 57  Resp: 15 19 20 19   Temp:      TempSrc:      SpO2: 95% 97% 97% 96%  Weight:      Height:      PainSc:        Isolation Precautions No active isolations  Medications Medications  furosemide (LASIX) injection 20 mg (has no administration in time range)  cefTRIAXone (ROCEPHIN) 1 g in sodium chloride 0.9 %  100 mL IVPB (has no administration in time range)  azithromycin (ZITHROMAX) tablet 500 mg (has no administration in time range)  enoxaparin (LOVENOX) injection 40 mg (has no administration in time range)  furosemide (LASIX) injection 20 mg (20 mg Intravenous Given 12/14/22 0308)    Mobility walks with device   Prosthetic  leg  Focused Assessments Pulmonary Assessment Handoff:  Lung sounds:   O2 Device: Nasal Cannula O2 Flow Rate (L/min): 2 L/min    R Recommendations: See Admitting Provider Note  Report given to:   Additional Notes:  Please call with any questions

## 2022-12-15 DIAGNOSIS — I2510 Atherosclerotic heart disease of native coronary artery without angina pectoris: Secondary | ICD-10-CM

## 2022-12-15 DIAGNOSIS — I4821 Permanent atrial fibrillation: Secondary | ICD-10-CM

## 2022-12-15 DIAGNOSIS — I2583 Coronary atherosclerosis due to lipid rich plaque: Secondary | ICD-10-CM

## 2022-12-15 DIAGNOSIS — N1832 Chronic kidney disease, stage 3b: Secondary | ICD-10-CM | POA: Diagnosis not present

## 2022-12-15 DIAGNOSIS — I498 Other specified cardiac arrhythmias: Secondary | ICD-10-CM | POA: Diagnosis not present

## 2022-12-15 DIAGNOSIS — F32A Depression, unspecified: Secondary | ICD-10-CM

## 2022-12-15 DIAGNOSIS — Z89512 Acquired absence of left leg below knee: Secondary | ICD-10-CM

## 2022-12-15 DIAGNOSIS — D649 Anemia, unspecified: Secondary | ICD-10-CM

## 2022-12-15 DIAGNOSIS — E785 Hyperlipidemia, unspecified: Secondary | ICD-10-CM

## 2022-12-15 DIAGNOSIS — I5031 Acute diastolic (congestive) heart failure: Secondary | ICD-10-CM | POA: Diagnosis not present

## 2022-12-15 DIAGNOSIS — E871 Hypo-osmolality and hyponatremia: Secondary | ICD-10-CM

## 2022-12-15 DIAGNOSIS — I10 Essential (primary) hypertension: Secondary | ICD-10-CM

## 2022-12-15 DIAGNOSIS — E876 Hypokalemia: Secondary | ICD-10-CM | POA: Insufficient documentation

## 2022-12-15 LAB — BASIC METABOLIC PANEL
Anion gap: 8 (ref 5–15)
BUN: 12 mg/dL (ref 8–23)
CO2: 28 mmol/L (ref 22–32)
Calcium: 8.5 mg/dL — ABNORMAL LOW (ref 8.9–10.3)
Chloride: 100 mmol/L (ref 98–111)
Creatinine, Ser: 1.08 mg/dL — ABNORMAL HIGH (ref 0.44–1.00)
GFR, Estimated: 48 mL/min — ABNORMAL LOW (ref >60)
Glucose, Bld: 94 mg/dL (ref 70–99)
Potassium: 3.3 mmol/L — ABNORMAL LOW (ref 3.5–5.1)
Sodium: 136 mmol/L (ref 135–145)

## 2022-12-15 LAB — CBC
HCT: 31.1 % — ABNORMAL LOW (ref 36.0–46.0)
Hemoglobin: 10.2 g/dL — ABNORMAL LOW (ref 12.0–15.0)
MCH: 29.7 pg (ref 26.0–34.0)
MCHC: 32.8 g/dL (ref 30.0–36.0)
MCV: 90.7 fL (ref 80.0–100.0)
Platelets: 194 10*3/uL (ref 150–400)
RBC: 3.43 MIL/uL — ABNORMAL LOW (ref 3.87–5.11)
RDW: 14.7 % (ref 11.5–15.5)
WBC: 6.9 10*3/uL (ref 4.0–10.5)
nRBC: 0 % (ref 0.0–0.2)

## 2022-12-15 LAB — PROCALCITONIN: Procalcitonin: <0.1 ng/mL

## 2022-12-15 MED ORDER — FUROSEMIDE 20 MG PO TABS
20.0000 mg | ORAL_TABLET | Freq: Every day | ORAL | Status: DC
  Administered 2022-12-15 – 2022-12-16 (×2): 20 mg via ORAL
  Filled 2022-12-15 (×2): qty 1

## 2022-12-15 MED ORDER — METOPROLOL TARTRATE 25 MG PO TABS
25.0000 mg | ORAL_TABLET | Freq: Two times a day (BID) | ORAL | Status: DC
  Administered 2022-12-15 – 2022-12-16 (×3): 25 mg via ORAL
  Filled 2022-12-15 (×3): qty 1

## 2022-12-15 MED ORDER — WHITE PETROLATUM EX OINT
TOPICAL_OINTMENT | CUTANEOUS | Status: DC | PRN
  Filled 2022-12-15: qty 28.35

## 2022-12-15 MED ORDER — POTASSIUM CHLORIDE CRYS ER 20 MEQ PO TBCR
40.0000 meq | EXTENDED_RELEASE_TABLET | Freq: Once | ORAL | Status: AC
  Administered 2022-12-15: 40 meq via ORAL
  Filled 2022-12-15: qty 2

## 2022-12-15 NOTE — Assessment & Plan Note (Signed)
Mild, asymptomatic. - Continue furosemide 

## 2022-12-15 NOTE — Assessment & Plan Note (Signed)
-   Continue Zetia, simvastatin

## 2022-12-15 NOTE — Progress Notes (Signed)
Rounding Note    Patient Name: Ana Franco Date of Encounter: 12/15/2022  [facility] Cardiologist: Jodelle Red, MD   Subjective   Denies any breathing issue last night. Slept flat. No chest pain  Inpatient Medications    Scheduled Meds:  apixaban  5 mg Oral BID   azithromycin  500 mg Oral Daily   citalopram  10 mg Oral Daily   ezetimibe  10 mg Oral q1800   And   simvastatin  20 mg Oral q1800   gabapentin  100 mg Oral BID   pantoprazole  40 mg Oral Daily   Continuous Infusions:  PRN Meds: acetaminophen, melatonin, polyethylene glycol, prochlorperazine   Vital Signs    Vitals:   12/14/22 0846 12/14/22 1716 12/14/22 1920 12/15/22 0448  BP: 136/61 (!) 120/48 139/66 (!) 128/52  Pulse:  70 69 (!) 59  Resp: 16 16 18 16   Temp:  98.3 F (36.8 C) 99 F (37.2 C) 98.6 F (37 C)  TempSrc:  Oral Oral Oral  SpO2:  95% 95%   Weight:    81.2 kg  Height:        Intake/Output Summary (Last 24 hours) at 12/15/2022 0806 Last data filed at 12/15/2022 0500 Gross per 24 hour  Intake --  Output 3350 ml  Net -3350 ml      12/15/2022    4:48 AM 12/14/2022   12:29 AM 03/20/2022    8:15 AM  Last 3 Weights  Weight (lbs) 179 lb 170 lb 186 lb 8.2 oz  Weight (kg) 81.194 kg 77.111 kg 84.6 kg      Telemetry    Atrial fibrillation with bigeminy - Personally Reviewed  ECG    Atrial fibrillation with HR 50s - Personally Reviewed  Physical Exam   GEN: No acute distress.   Neck: No JVD Cardiac: irregularly irregular, no murmurs, rubs, or gallops.  Respiratory: Clear to auscultation bilaterally. GI: Soft, nontender, non-distended  MS: No edema; L BKA Neuro:  Nonfocal  Psych: Normal affect   Labs    High Sensitivity Troponin:   Recent Labs  Lab 12/14/22 0047 12/14/22 0300  TROPONINIHS 9 9     Chemistry Recent Labs  Lab 12/14/22 0047 12/15/22 0141  NA 131* 136  K 4.3 3.3*  CL 99 100  CO2 22 28  GLUCOSE 107* 94  BUN 14 12  CREATININE 1.16*  1.08*  CALCIUM 8.7* 8.5*  PROT 5.8*  --   ALBUMIN 2.8*  --   AST 27  --   ALT 10  --   ALKPHOS 75  --   BILITOT 1.0  --   GFRNONAA 44* 48*  ANIONGAP 10 8    Lipids No results for input(s): "CHOL", "TRIG", "HDL", "LABVLDL", "LDLCALC", "CHOLHDL" in the last 168 hours.  Hematology Recent Labs  Lab 12/14/22 0047 12/15/22 0141  WBC 8.8 6.9  RBC 3.14* 3.43*  HGB 9.4* 10.2*  HCT 28.6* 31.1*  MCV 91.1 90.7  MCH 29.9 29.7  MCHC 32.9 32.8  RDW 14.7 14.7  PLT 197 194   Thyroid No results for input(s): "TSH", "FREET4" in the last 168 hours.  BNP Recent Labs  Lab 12/14/22 0047  BNP 486.5*    DDimer No results for input(s): "DDIMER" in the last 168 hours.   Radiology    ECHOCARDIOGRAM COMPLETE  Result Date: 12/14/2022    ECHOCARDIOGRAM REPORT   Patient Name:   Ana Franco Date of Exam: 12/14/2022 Medical Rec #:  [id number]  Height:       68.0 in Accession #:    [id number]     Weight:       170.0 lb Date of Birth:  May 23, 1926      BSA:          1.907 m Patient Age:    89 years       BP:           129/51 mmHg Patient Gender: F              HR:           60 bpm. Exam Location:  Inpatient Procedure: 2D Echo, Cardiac Doppler and Color Doppler Indications:    CHF-Acute Diastolic I50.31  History:        Patient has prior history of Echocardiogram examinations, most                 recent 01/07/2021. CHF, CAD and Previous Myocardial Infarction,                 Arrythmias:Atrial Fibrillation; Risk Factors:Hypertension,                 Dyslipidemia and Non-Smoker.  Sonographer:    Aron Baba Referring Phys: [id number] Darlin Drop  Sonographer Comments: Image acquisition challenging due to respiratory motion and Image acquisition challenging due to patient body habitus. IMPRESSIONS  1. Left ventricular ejection fraction, by estimation, is 60 to 65%. The left ventricle has normal function. The left ventricle has no regional wall motion abnormalities. There is moderate left ventricular hypertrophy.  Left ventricular diastolic parameters are indeterminate.  2. Right ventricular systolic function is normal. The right ventricular size is normal. There is normal pulmonary artery systolic pressure. The estimated right ventricular systolic pressure is 35.2 mmHg.  3. Left atrial size was severely dilated.  4. Right atrial size was severely dilated.  5. The mitral valve is normal in structure. Mild mitral valve regurgitation. No evidence of mitral stenosis. Severe mitral annular calcification.  6. The aortic valve is tricuspid. There is moderate calcification of the aortic valve. There is moderate thickening of the aortic valve. Aortic valve regurgitation is moderate. Aortic valve sclerosis is present, with no evidence of aortic valve stenosis.  7. The inferior vena cava is dilated in size with >50% respiratory variability, suggesting right atrial pressure of 8 mmHg. Comparison(s): No significant change from prior study. Prior images reviewed side by side. FINDINGS  Left Ventricle: Left ventricular ejection fraction, by estimation, is 60 to 65%. The left ventricle has normal function. The left ventricle has no regional wall motion abnormalities. The left ventricular internal cavity size was normal in size. There is  moderate left ventricular hypertrophy. Left ventricular diastolic parameters are indeterminate. Right Ventricle: The right ventricular size is normal. No increase in right ventricular wall thickness. Right ventricular systolic function is normal. There is normal pulmonary artery systolic pressure. The tricuspid regurgitant velocity is 2.61 m/s, and  with an assumed right atrial pressure of 8 mmHg, the estimated right ventricular systolic pressure is 35.2 mmHg. Left Atrium: Left atrial size was severely dilated. Right Atrium: Right atrial size was severely dilated. Pericardium: There is no evidence of pericardial effusion. Mitral Valve: The mitral valve is normal in structure. Severe mitral annular  calcification. Mild mitral valve regurgitation. No evidence of mitral valve stenosis. Tricuspid Valve: The tricuspid valve is normal in structure. Tricuspid valve regurgitation is mild . No evidence of tricuspid stenosis. Aortic Valve: The aortic valve is  tricuspid. There is moderate calcification of the aortic valve. There is moderate thickening of the aortic valve. Aortic valve regurgitation is moderate. Aortic valve sclerosis is present, with no evidence of aortic valve stenosis. Pulmonic Valve: The pulmonic valve was normal in structure. Pulmonic valve regurgitation is not visualized. No evidence of pulmonic stenosis. Aorta: The aortic root is normal in size and structure. Venous: The inferior vena cava is dilated in size with greater than 50% respiratory variability, suggesting right atrial pressure of 8 mmHg. IAS/Shunts: No atrial level shunt detected by color flow Doppler.  LEFT VENTRICLE PLAX 2D LVIDd:         3.90 cm   Diastology LVIDs:         2.90 cm   LV e' medial:    15.60 cm/s LV PW:         1.50 cm   LV E/e' medial:  9.0 LV IVS:        1.10 cm   LV e' lateral:   10.00 cm/s LVOT diam:     2.00 cm   LV E/e' lateral: 14.1 LV SV:         74 LV SV Index:   39 LVOT Area:     3.14 cm  RIGHT VENTRICLE RV S prime:     10.70 cm/s TAPSE (M-mode): 1.5 cm LEFT ATRIUM              Index        RIGHT ATRIUM           Index LA diam:        5.30 cm  2.78 cm/m   RA Area:     27.30 cm LA Vol (A2C):   102.0 ml 53.48 ml/m  RA Volume:   87.20 ml  45.72 ml/m LA Vol (A4C):   187.0 ml 98.04 ml/m LA Biplane Vol: 143.0 ml 74.97 ml/m  AORTIC VALVE LVOT Vmax:   90.30 cm/s LVOT Vmean:  60.400 cm/s LVOT VTI:    0.237 m  AORTA Ao Root diam: 3.20 cm Ao Asc diam:  3.20 cm MITRAL VALVE                TRICUSPID VALVE MV Area (PHT): 3.99 cm     TR Peak grad:   27.2 mmHg MV Decel Time: 190 msec     TR Vmax:        261.00 cm/s MR Peak grad: 91.4 mmHg MR Vmax:      478.00 cm/s   SHUNTS MV E velocity: 141.00 cm/s  Systemic VTI:  0.24  m MV A velocity: 48.00 cm/s   Systemic Diam: 2.00 cm MV E/A ratio:  2.94 Donato Schultz MD Electronically signed by Donato Schultz MD Signature Date/Time: 12/14/2022/3:25:59 PM    Final    DG Chest 2 View  Result Date: 12/14/2022 CLINICAL DATA:  Shortness of breath EXAM: CHEST - 2 VIEW COMPARISON:  12/14/2022 FINDINGS: Cardiomegaly, vascular congestion. Right suprahilar and right lower lobe airspace disease again noted. Questionable left perihilar opacity. No visible significant effusions or acute bony abnormality. IMPRESSION: Cardiomegaly with vascular congestion. Right perihilar and lower lobe airspace opacities with questionable left perihilar opacity. Findings concerning for pneumonia (favored) or edema. Electronically Signed   By: Charlett Nose M.D.   On: 12/14/2022 02:42   DG Chest Port 1 View  Result Date: 12/14/2022 CLINICAL DATA:  sob EXAM: PORTABLE CHEST 1 VIEW COMPARISON:  Chest x-ray 01/06/2021. FINDINGS: The heart and mediastinal contours are unchanged. Aortic calcification.  Right upper lung zone and retrocardiac airspace opacity. No pulmonary edema. Possible trace pleural effusions. No pneumothorax. No acute osseous abnormality. IMPRESSION: 1. Low lung volumes with right upper lung zone and retrocardiac airspace opacity. Possible trace pleural effusions bilaterally. Recommend x-ray PA and lateral view of the chest with improved inspiratory effort for improved evaluation. 2.  Aortic Atherosclerosis (ICD10-I70.0). Electronically Signed   By: Tish Frederickson M.D.   On: 12/14/2022 01:26    Cardiac Studies   Echo 12/14/2022  1. Left ventricular ejection fraction, by estimation, is 60 to 65%. The  left ventricle has normal function. The left ventricle has no regional  wall motion abnormalities. There is moderate left ventricular hypertrophy.  Left ventricular diastolic  parameters are indeterminate.   2. Right ventricular systolic function is normal. The right ventricular  size is normal. There is  normal pulmonary artery systolic pressure. The  estimated right ventricular systolic pressure is 35.2 mmHg.   3. Left atrial size was severely dilated.   4. Right atrial size was severely dilated.   5. The mitral valve is normal in structure. Mild mitral valve  regurgitation. No evidence of mitral stenosis. Severe mitral annular  calcification.   6. The aortic valve is tricuspid. There is moderate calcification of the  aortic valve. There is moderate thickening of the aortic valve. Aortic  valve regurgitation is moderate. Aortic valve sclerosis is present, with  no evidence of aortic valve  stenosis.   7. The inferior vena cava is dilated in size with >50% respiratory  variability, suggesting right atrial pressure of 8 mmHg.   Comparison(s): No significant change from prior study. Prior images  reviewed side by side.    Patient Profile     89 y.o. female with past medical history of hypertension, hyperlipidemia, permanent atrial fibrillation on Eliquis, history of left transtibial amputation and history of NSTEMI in [place] as result of ankle fracture presented with worsening dyspnea on exertion since recovering from FluA 2-3 weeks ago.   Assessment & Plan    Acute respiratory failure:  Last echocardiogram in 12/2020 demonstrated EF 60 to 65% with mild MR. Repeat echo 12/14/2022 showed EF 60-65%, LVSP 35.2 mmHg, severe biatrial enlargement, moderate AI, mild MR. Serial trop and procalcitonin negative.   - appears to be euvolemic on exam. I/O -3.3 L.  Consider switch IV lasix to 20mg  daily PO lasix.   Permanent atrial fibrillation: on Eliquis. Per patient, she is taking 50mg  BID metoprolol at home, her BP and HR is surprisingly good here without metoprolol, however with increasing bigeminy, consider restart metoprolol at least at 25mg  BID.   Hypokalemia: related to diuresis, ordered a single dose of KCl  HTN: off of home metoprolol here, SBP 120-130s, consider resume metoprolol at  lower dose of 25mg  BID  HLD  History of L transtibial amputation      For questions or updates, please contact [facility] Please consult [email or web address] for contact info under        Signed, Azalee Course, PA  12/15/2022, 8:06 AM

## 2022-12-15 NOTE — Assessment & Plan Note (Signed)
Continue celexa

## 2022-12-15 NOTE — Assessment & Plan Note (Signed)
Resolved with supplementation and starting spironolactone. 

## 2022-12-15 NOTE — Assessment & Plan Note (Signed)
Admitted with SOB with exertion, cardiomegaly, pulmonary edema and hypoxia.  Procal negative x2, antibiotics stopped.  Started on IV lasix and diuresed 3.5L, weaned off O2.  Echo shows preserved EF - Transition to oral Lasix

## 2022-12-15 NOTE — TOC Initial Note (Signed)
Transition of Care [facility]) - Initial/Assessment Note    Patient Details  Name: Ana Franco MRN: [id number] Date of Birth: [birth year]  Transition of Care [facility]) CM/SW Contact:    Dianna Limbo [place], [place] Phone Number: [phone] 12/15/2022, 3:22 PM  Clinical Narrative: [facility] team for discharge planning. Spoke to sonBrett Canales [place] about discharge plan for HHPT. Son is pleasant and engaging. He agrees with the plan. Inquired if he has a preference to home health agengies. He shares patinet has never used the service before. Call to [facility] to arrange for HHPT. Spoke the [facility] who gladly accepts the referral. Call palced to son to notify him that services will be provided by [place]. Brett Canales shares he wants to be the point of contact for HHPT services. Shared this information with [facility]. No further needs identified.                 Expected Discharge Plan: Home w Home Health Services (Home is- [facility] [place].) Barriers to Discharge: No Barriers Identified   Patient Goals and CMS Choice            Expected Discharge Plan and Services In-house Referral: Clinical Social Work     Living arrangements for the past 2 months: Assisted Living Facility ([facility] [place])                             [facility] Agency: [facility] Date [facility] Agency Contacted: 12/15/22 Time HH Agency Contacted: 1500 Representative spoke with at [facility] Agency: Bjorn [phone]  Prior Living Arrangements/Services Living arrangements for the past 2 months: Assisted Living Facility ([facility] [place]) Lives with:: Self                   Activities of Daily Living      Permission Sought/Granted Permission sought to share information with : Case Manager          Permission granted to share info w Relationship: son  Permission granted to share info w Contact Information: Brett Canales T[place]  Emotional Assessment           Psych Involvement:  No (comment)  Admission diagnosis:  Acute diastolic CHF (congestive heart failure) (HCC) [I50.31] Acute congestive heart failure, unspecified heart failure type [facility]) [I50.9] Patient Active Problem List   Diagnosis Date Noted   Hypokalemia 12/15/2022   Acute diastolic CHF (congestive heart failure) (HCC) 12/14/2022   Hyponatremia 12/14/2022   Normocytic anemia 12/14/2022   Coronary artery disease 12/14/2022   Hx of BKA, left (HCC) 12/14/2022   Permanent atrial fibrillation (HCC) 12/18/2021   H/O: CVA (cerebrovascular accident) 12/18/2021   History of non-ST elevation myocardial infarction (NSTEMI) 12/18/2021   Acute CVA (cerebrovascular accident) (HCC) 01/07/2021   Thrombocytopenia (HCC) 01/06/2021   TIA (transient ischemic attack) 01/06/2021   DNR (do not resuscitate) 01/06/2021   Chronic kidney disease CKD IIIb 01/06/2021   Paroxysmal atrial fibrillation (HCC) 05/04/2018   Gangrene of lower extremity (HCC)    Subacute osteomyelitis, left ankle and foot (HCC)    S/P ORIF (open reduction internal fixation) fracture 04/03/2018   Wound infection 04/02/2018   MRSA (methicillin resistant staph aureus) culture positive 04/02/2018   Acute blood loss anemia 04/02/2018   Closed left ankle fracture, with delayed healing, subsequent encounter 03/31/2018   Essential hypertension, benign 06/23/2014   Dyslipidemia 06/23/2014   GERD (gastroesophageal reflux disease) 06/23/2014   UTI (urinary tract infection) 06/23/2014  Depression 06/23/2014   PCP:  Gaspar Garbe, MD Pharmacy:   [facility] [facility] [address] - [place], [place] - [address] [facility] [place] [address] Phone: [phone] Fax: [phone]  [facility] [place] [facility] [place], [place] - [address] [place] [address] Phone: [phone] Fax: [phone]     Social Determinants of Health (SDOH) Social History: SDOH Screenings   Food Insecurity:  Unknown (04/09/2018)  Transportation Needs: Unknown (04/09/2018)  Tobacco Use: Low Risk  (12/14/2022)   SDOH Interventions:     Readmission Risk Interventions     No data to display

## 2022-12-15 NOTE — Assessment & Plan Note (Signed)
-   Continue apixaban, zetia, statin, BB

## 2022-12-15 NOTE — Assessment & Plan Note (Signed)
Cr stable relative to baseline.  AKI ruled out

## 2022-12-15 NOTE — Progress Notes (Signed)
  Progress Note   Patient: Ana Franco ZOX:[id number] DOB: December 05, 1925 DOA: 12/14/2022     0 DOS: the patient was seen and examined on 12/15/2022 at 10:19AM      Brief hospital course: Mrs. Edmund is a 89 y.o. F with permAF on Eliquis, hx L BKA, CAD, HTN, hx TIA, HLD, and influenza 3 weeks ago who presented with 1 day increased SOB with exertion, found to have CHF.     Assessment and Plan: * Acute diastolic CHF (congestive heart failure) (HCC) Admitted with SOB with exertion, cardiomegaly, pulmonary edema and hypoxia.  Procal negative x2, antibiotics stopped.  Started on IV lasix and diuresed 3.5L, weaned off O2.  Echo shows preserved EF - Transition to oral Lasix    Hypokalemia - Supplement K  Hx of BKA, left (HCC)    Coronary artery disease - Continue apixaban, zetia, statin, BB  Normocytic anemia Hgb stable relative to baseline, due to CKD, no clinical bleeding  Chronic kidney disease CKD IIIb Cr stable relative to baseline.  AKI ruled out  Hyponatremia Mild, asymptomatic - Continue furosemide  Permanent atrial fibrillation (HCC) - Continue Apixaban - Resume metoprolol  Depression - Continue celexa  Dyslipidemia - Continue Zetia, simvastatin  Essential hypertension, benign BP high normal - Continue furosemide - Resume home metoprolol - Hold amlodipine          Subjective: Patient is feeling better, she still has some mild dyspnea but this is improving, she is not very mobile so it is hard to tell.  No nursing concerns, no fever overnight.     Physical Exam: BP (!) 146/52 (BP Location: Left Arm)   Pulse (!) 59   Temp 98.6 F (37 C) (Oral)   Resp 16   Ht 5\' 8"  (1.727 m)   Wt 81.2 kg   SpO2 95%   BMI 27.22 kg/m   Elderly adult female, lying in bed, no acute distress RRR, occasional premature beats, soft systolic murmur, no pitting edema in her leg Respiratory rate seems normal, lungs clear without rales or wheezes Abdomen soft without  tenderness palpation or guarding Attention normal, affect normal, judgment and insight appear mildly impaired but at baseline, face symmetric, speech fluent Left leg is amputated, right foot/toes appears normal    Data Reviewed: Discussed with cardiology PA Basic metabolic panel shows creatinine stable at 1.08, potassium 23.3 Procalcitonin negative CBC shows hemoglobin 10, unchanged   Family Communication: Son by phone    Disposition: Status is: Observation The patient was admitted for congestive heart failure, she is being treated with IV Lasix and will transition to oral Lasix today.  Given her age, debility and amputation I think that we should watch her overnight on oral diuretic, check labs, and ensure that she is mobile on her prosthetic leg before discharging back to her assisted living facility with intermittent supervision          Author: Alberteen Sam, MD 12/15/2022 1:30 PM  For on call review [email or web address].

## 2022-12-15 NOTE — Assessment & Plan Note (Signed)
BP high normal - Continue furosemide - Resume home metoprolol - Hold amlodipine

## 2022-12-15 NOTE — Assessment & Plan Note (Signed)
Hgb stable relative to baseline, due to CKD, no clinical bleeding

## 2022-12-15 NOTE — Assessment & Plan Note (Signed)
-   Continue Apixaban - Resume metoprolol

## 2022-12-15 NOTE — Evaluation (Addendum)
Physical Therapy Evaluation Patient Details Name: Ana Franco MRN: [id number] DOB: 04/24/1926 Today's Date: 12/15/2022  History of Present Illness  Pt is a 89 y.o. female admitted 12/14/22 with SOB; workup for CHF exacerbation. Of note, recent influenza dx 3 wks prior. Other PMH includes L BKA, afib on Eliquis, HTN, CAD, TIA/CVA, HLD.   Clinical Impression  Pt presents with an overall decrease in functional mobility secondary to above. PTA, pt resident at SNF/ALF, ambulating short distances with RW and LLE prosthetic, also uses w/c; staff assists with ADL/iADLs as needed. Today, pt tolerated prolonged sitting EOB activity, but ultimately unable to progress to standing/ambulation since she is missing sock for LLE prosthetic, so it does not fit (son unable to bring to hospital); pt declines attempts to stand or transfer without it donned. Pt reports not working with PT at SNF/ALF due to R foot wound limiting her ambulation distance. Recommend return to SNF/ALF if staff able to continue providing necessary assist. If pt to remain admitted, will follow acutely to address established goals.  SpO2 94% on RA    Recommendations for follow up therapy are one component of a multi-disciplinary discharge planning process, led by the attending physician.  Recommendations may be updated based on patient status, additional functional criteria and insurance authorization.  Assistance Recommended at Discharge Intermittent Supervision/Assistance  Patient can return home with the following  A little help with walking and/or transfers;A little help with bathing/dressing/bathroom;Assistance with cooking/housework;Assist for transportation;Help with stairs or ramp for entrance    Equipment Recommendations None recommended by PT  Recommendations for Other Services       Functional Status Assessment Patient has had a recent decline in their functional status and demonstrates the ability to make significant  improvements in function in a reasonable and predictable amount of time.     Precautions / Restrictions Precautions Precautions: Fall;Other (comment) Precaution Comments: h/o L BKA (has prosthetic in room but does not have sock, so will not fit) Restrictions Weight Bearing Restrictions: No      Mobility  Bed Mobility Overal bed mobility: Needs Assistance Bed Mobility: Supine to Sit, Sit to Supine     Supine to sit: Min assist Sit to supine: Supervision   General bed mobility comments: minA for HHA to elevate trunk and scoot hips to EOB; pt able to return to supine without assist. assist to scoot up in bed    Transfers                   General transfer comment: L BKA prosthetic will not fit since sock missing, pt declines attempts to stand or pivot without LLE prosthetic donned. pt able to perform lateral scoot along EOB with supervision, increased time and effort    Ambulation/Gait                  Stairs            Wheelchair Mobility    Modified Rankin (Stroke Patients Only)       Balance Overall balance assessment: Needs assistance   Sitting balance-Leahy Scale: Fair Sitting balance - Comments: able to go from elbow propping<>sitting upright without assist, donning L BKA shin pylon while sitting EOB                                     Pertinent Vitals/Pain Pain Assessment Pain Assessment: Faces Faces Pain Scale: Hurts little more Pain  Location: bilateral knees Pain Descriptors / Indicators: Sore Pain Intervention(s): Monitored during session, Limited activity within patient's tolerance    Home Living Family/patient expects to be discharged to:: Skilled nursing facility                 Home Equipment: Agricultural consultant (2 wheels);Wheelchair - manual      Prior Function Prior Level of Function : Needs assist             Mobility Comments: ambulating short distances to/from bathroom with RW and LLE  prosthetic; also uses w/c. pt reports she stopped working with PT there because of R foot wounds and they wanted her to avoid wearing a shoe (?) ADLs Comments: does not go to dining hall, typically eats in room (meals provided). staff assists with bathing on shower seat 2x/wk; pt reports mod indep with toileting     Hand Dominance        Extremity/Trunk Assessment   Upper Extremity Assessment Upper Extremity Assessment: Generalized weakness    Lower Extremity Assessment Lower Extremity Assessment: Generalized weakness;LLE deficits/detail LLE Deficits / Details: h/o L BKA with good knee AROM, functional hip and knee strength >3/5    Cervical / Trunk Assessment Cervical / Trunk Assessment: Kyphotic  Communication   Communication: No difficulties  Cognition Arousal/Alertness: Awake/alert Behavior During Therapy: WFL for tasks assessed/performed Overall Cognitive Status: Within Functional Limits for tasks assessed                                          General Comments General comments (skin integrity, edema, etc.): SpO2 94% on RA with activity, pt notes improvement in SOB, her main concern is that she does not have sock for L BKA (she called her son during session but he is unable to bring for next session); pt reports staff able to provide necessary assist at her living facility    Exercises     Assessment/Plan    PT Assessment Patient needs continued PT services  PT Problem List Decreased activity tolerance;Decreased balance;Decreased strength;Decreased mobility;Cardiopulmonary status limiting activity       PT Treatment Interventions DME instruction;Gait training;Functional mobility training;Therapeutic activities;Therapeutic exercise;Wheelchair mobility training;Patient/family education;Balance training    PT Goals (Current goals can be found in the Care Plan section)  Acute Rehab PT Goals Patient Stated Goal: return to SNF/ALF PT Goal Formulation:  With patient Time For Goal Achievement: 12/29/22 Potential to Achieve Goals: Good    Frequency Min 1X/week     Co-evaluation               AM-PAC PT "6 Clicks" Mobility  Outcome Measure Help needed turning from your back to your side while in a flat bed without using bedrails?: None Help needed moving from lying on your back to sitting on the side of a flat bed without using bedrails?: A Little Help needed moving to and from a bed to a chair (including a wheelchair)?: A Little Help needed standing up from a chair using your arms (e.g., wheelchair or bedside chair)?: A Little Help needed to walk in hospital room?: Total Help needed climbing 3-5 steps with a railing? : Total 6 Click Score: 15    End of Session   Activity Tolerance: Patient tolerated treatment well Patient left: in bed;with call bell/phone within reach;with bed alarm set Nurse Communication: Mobility status PT Visit Diagnosis: Other abnormalities of gait  and mobility (R26.89);Muscle weakness (generalized) (M62.81)    Time: 1610-9604 PT Time Calculation (min) (ACUTE ONLY): 32 min   Charges:   PT Evaluation $PT Eval Moderate Complexity: 1 Mod PT Treatments $Therapeutic Activity: 8-22 mins      Ina Homes, PT, DPT Acute Rehabilitation Services  Personal: Secure Chat Rehab Office: [phone]  Malachy Chamber 12/15/2022, 12:39 PM

## 2022-12-15 NOTE — TOC Progression Note (Signed)
Transition of Care [facility]) - Progression Note    Patient Details  Name: Ana Franco MRN: [id number] Date of Birth: [birth year]  Transition [facility]) CM/SW Contact  Georgie Chard, LCSW Phone Number: 12/15/2022, 1:35 PM  Clinical Narrative:    CSW spoke to the patient's son at this time the son is aware that the patient will DC to [facility] back to ALF. Son would like TOC to follow up prior to DC tomorrow. TOC will help with arranging DC       Expected Discharge Plan and Services                                               Social Determinants of Health (SDOH) Interventions SDOH Screenings   Food Insecurity: Unknown (04/09/2018)  Transportation Needs: Unknown (04/09/2018)  Tobacco Use: Low Risk  (12/14/2022)    Readmission Risk Interventions     No data to display

## 2022-12-16 ENCOUNTER — Other Ambulatory Visit: Payer: Self-pay

## 2022-12-16 DIAGNOSIS — I5031 Acute diastolic (congestive) heart failure: Secondary | ICD-10-CM | POA: Diagnosis not present

## 2022-12-16 LAB — BASIC METABOLIC PANEL
Anion gap: 8 (ref 5–15)
BUN: 14 mg/dL (ref 8–23)
CO2: 25 mmol/L (ref 22–32)
Calcium: 8.4 mg/dL — ABNORMAL LOW (ref 8.9–10.3)
Chloride: 100 mmol/L (ref 98–111)
Creatinine, Ser: 1.32 mg/dL — ABNORMAL HIGH (ref 0.44–1.00)
GFR, Estimated: 37 mL/min — ABNORMAL LOW (ref >60)
Glucose, Bld: 112 mg/dL — ABNORMAL HIGH (ref 70–99)
Potassium: 3.8 mmol/L (ref 3.5–5.1)
Sodium: 133 mmol/L — ABNORMAL LOW (ref 135–145)

## 2022-12-16 MED ORDER — AMLODIPINE BESYLATE 5 MG PO TABS: 5.0000 mg | ORAL_TABLET | Freq: Every day | ORAL | 3 refills | Status: DC

## 2022-12-16 MED ORDER — FUROSEMIDE 20 MG PO TABS: 20.0000 mg | ORAL_TABLET | Freq: Every day | ORAL | 1 refills | Status: DC

## 2022-12-16 MED ORDER — POTASSIUM CHLORIDE CRYS ER 10 MEQ PO TBCR: 10.0000 meq | EXTENDED_RELEASE_TABLET | Freq: Two times a day (BID) | ORAL | 1 refills | Status: DC

## 2022-12-16 MED ORDER — METOPROLOL TARTRATE 25 MG PO TABS: 25.0000 mg | ORAL_TABLET | Freq: Two times a day (BID) | ORAL | 3 refills | Status: DC

## 2022-12-16 NOTE — TOC Transition Note (Addendum)
Transition of Care [facility]) - CM/SW Discharge Note   Patient Details  Name: Ana Franco MRN: [id number] Date of Birth: 1926/07/26  Transition of Care [facility]) CM/SW Contact:  Ana Franco, LCSWA Phone Number: 12/16/2022, 1:41 PM   Clinical Narrative:     Patient will DC to: [facility]   Anticipated DC date: 12/16/2022  Family notified: Ana Franco  Transport by: Ana Franco  ?  Per MD patient ready for DC to [facility]  . RN, patient, patient's family, and facility notified of DC. Discharge Summary,FL2, and HH PT/OT/RN orders sent to facility. RN given number for report tele# [phone] RM#[id number]. DC packet on chart. DNR signed by MD attached to patients DC packet.Ambulance transport requested for patient.  CSW signing off.   Final next level of care: Assisted Living Barriers to Discharge: No Barriers Identified   Patient Goals and CMS Choice   Choice offered to / list presented to : Adult Children (Son Ana Franco)  Discharge Placement                Patient chooses bed at: [facility] (ALF) Patient to be transferred to facility by: [facility] Name of family member notified: Ana Franco Patient and family notified of of transfer: 12/16/22  Discharge Plan and Services Additional resources added to the After Visit Summary for   In-house Referral: Clinical Social Work                          [facility] Agency: [facility] Date [facility] Agency Contacted: 12/15/22 Time HH Agency Contacted: 1500 Representative spoke with at [facility] Agency: Bjorn [phone]  Social Determinants of Health (SDOH) Interventions SDOH Screenings   Food Insecurity: No Food Insecurity (12/16/2022)  Housing: Low Risk  (12/16/2022)  Transportation Needs: No Transportation Needs (12/16/2022)  Utilities: Not At Risk (12/16/2022)  Tobacco Use: Low Risk  (12/14/2022)     Readmission Risk Interventions     No data to display

## 2022-12-16 NOTE — NC FL2 (Addendum)
[place] MEDICAID FL2 LEVEL OF CARE FORM     IDENTIFICATION  Patient Name: Ana Franco Birthdate: August 28, 1925 Sex: female Admission Date (Current Location): 12/14/2022  [facility] and [place] Number:  Producer, television/film/video and Address:  [facility], [address] [place], [place] [address]      Provider Number: [id number]  Attending Physician Name and Address:  Alberteen Sam, *  Relative Name and Phone Number:  Brett Canales (Son) [phone]    Current Level of Care: Hospital Recommended Level of Care: Assisted Living Facility [facility]) Prior Approval Number:    Date Approved/Denied:   PASRR Number:    Discharge Plan: Other (Comment) [facility])    Current Diagnoses: Patient Active Problem List   Diagnosis Date Noted   Hypokalemia 12/15/2022   Acute diastolic CHF (congestive heart failure) (HCC) 12/14/2022   Hyponatremia 12/14/2022   Normocytic anemia 12/14/2022   Coronary artery disease 12/14/2022   Hx of BKA, left (HCC) 12/14/2022   Permanent atrial fibrillation (HCC) 12/18/2021   H/O: CVA (cerebrovascular accident) 12/18/2021   History of non-ST elevation myocardial infarction (NSTEMI) 12/18/2021   Acute CVA (cerebrovascular accident) (HCC) 01/07/2021   Thrombocytopenia (HCC) 01/06/2021   TIA (transient ischemic attack) 01/06/2021   DNR (do not resuscitate) 01/06/2021   Chronic kidney disease CKD IIIb 01/06/2021   Paroxysmal atrial fibrillation (HCC) 05/04/2018   Gangrene of lower extremity (HCC)    Subacute osteomyelitis, left ankle and foot (HCC)    S/P ORIF (open reduction internal fixation) fracture 04/03/2018   Wound infection 04/02/2018   MRSA (methicillin resistant staph aureus) culture positive 04/02/2018   Acute blood loss anemia 04/02/2018   Closed left ankle fracture, with delayed healing, subsequent encounter 03/31/2018   Essential hypertension, benign 06/23/2014   Dyslipidemia 06/23/2014    GERD (gastroesophageal reflux disease) 06/23/2014   UTI (urinary tract infection) 06/23/2014   Depression 06/23/2014    Orientation RESPIRATION BLADDER Height & Weight     Self, Time, Situation, Place (WDL)  Normal Incontinent, External catheter (External Urinary Catheter) Weight: 181 lb 3.5 oz (82.2 kg) Height:  5\' 8"  (172.7 cm)  BEHAVIORAL SYMPTOMS/MOOD NEUROLOGICAL BOWEL NUTRITION STATUS      Continent Diet (Low sodium heart healthy)  AMBULATORY STATUS COMMUNICATION OF NEEDS Skin   Limited Assist Verbally Other (Comment) (Abrasion,leg, R,lower,Ecchymosis,arm,Bil.,Wound/Incision LDAs)                       Personal Care Assistance Level of Assistance  Bathing, Feeding, Dressing Bathing Assistance: Limited assistance Feeding assistance: Independent Dressing Assistance: Limited assistance     Functional Limitations Info  Sight, Hearing, Speech Sight Info:  (WDL) Hearing Info: Adequate Speech Info: Adequate    SPECIAL CARE FACTORS FREQUENCY  PT (By licensed PT), OT (By licensed OT)     PT Frequency: Please see HH PT orders OT Frequency: Please see HH OT orders            Contractures Contractures Info: Not present    Additional Factors Info  Code Status, Allergies, Psychotropic Code Status Info: DNR Allergies Info: NKA Psychotropic Info: citalopram (CELEXA) tablet 10 mg daily           TAKE these medications     Acetaminophen 325 MG Chew Chew 650 mg by mouth every 6 (six) hours as needed (pain, fever, headache).    amLODipine 5 MG tablet Commonly known as: NORVASC Take 1 tablet (5 mg total) by mouth daily. Hold  for SBP < 100 What changed:  additional instructions Another medication with the same name was removed. Continue taking this medication, and follow the directions you see here.    apixaban 5 MG Tabs tablet Commonly known as: ELIQUIS Take 1 tablet (5 mg total) by mouth 2 (two) times daily.    Baclofen 5 MG Tabs Take 5 mg by mouth every 8  (eight) hours as needed (pain/muscle spasms).    beta carotene 40981 UNIT capsule Take 25,000 Units by mouth daily.    Biofreeze 4 % Gel Generic drug: Menthol (Topical Analgesic) Apply 1 application  topically every 6 (six) hours as needed (neck pain).    CALCIUM CARBONATE-VITAMIN D3 PO Take 1 tablet by mouth every morning.    cholecalciferol 25 MCG (1000 UNIT) tablet Commonly known as: VITAMIN D3 Take 1,000 Units by mouth every evening.    citalopram 20 MG tablet Commonly known as: CELEXA Take 10 mg by mouth daily.    docusate sodium 100 MG capsule Commonly known as: COLACE Take 100 mg by mouth 2 (two) times daily as needed for mild constipation.    esomeprazole 40 MG capsule Commonly known as: NEXIUM Take 40 mg by mouth daily.    ezetimibe-simvastatin 10-20 MG tablet Commonly known as: VYTORIN Take 1 tablet by mouth every evening.    furosemide 20 MG tablet Commonly known as: LASIX Take 1 tablet (20 mg total) by mouth daily. Start taking on: Dec 17, 2022    gabapentin 100 MG capsule Commonly known as: NEURONTIN Take 200 mg by mouth 3 (three) times daily.    lidocaine 4 % cream Commonly known as: LMX Apply 1 Application topically every 6 (six) hours as needed (lower back pain).    metoprolol tartrate 25 MG tablet Commonly known as: LOPRESSOR Take 1 tablet (25 mg total) by mouth 2 (two) times daily. Hold for SBP < 105 or HR < 60 What changed:  medication strength how much to take additional instructions    nitroGLYCERIN 0.4 MG SL tablet Commonly known as: NITROSTAT Place 0.4 mg under the tongue every 5 (five) minutes as needed for chest pain.    ondansetron 4 MG disintegrating tablet Commonly known as: ZOFRAN-ODT Take 4 mg by mouth every 6 (six) hours as needed for nausea or vomiting.    polyethylene glycol 17 g packet Commonly known as: MIRALAX / GLYCOLAX Take 17 g by mouth daily as needed for mild constipation.    vitamin B-12 500 MCG tablet Commonly  known as: CYANOCOBALAMIN Take 500 mcg by mouth every morning.    White Petroleum Jelly Gel Apply 1 application  topically 2 (two) times daily. Apply vaseline to right foot twice daily due to dry skin.       Additional Information SSN-[id number]  Delilah Shan, LCSWA

## 2022-12-16 NOTE — TOC Progression Note (Signed)
Transition of Care [facility]) - Progression Note    Patient Details  Name: Ana Franco MRN: [id number] Date of Birth: 31-May-1926  Transition of Care [facility]) CM/SW Contact  Delilah Shan, LCSWA Phone Number: 12/16/2022, 1:30 PM  Clinical Narrative:     CSW spoke with Graceanne with [facility] in regards to [facility] PT for patient. Graceanne request for CSW to fax over any [facility] PT/OT orders. Graceanne informed CSW that patient can go through their PT services there. CSW called patients son and informed him. Patient son in agreement to cancel [facility] PT for patient. CSW called [facility] and cancelled [facility] PT for patient. Graceanne confirmed patient can dc over today back to ALF when medically ready. CSW informed MD. CSW will continue to follow.   Expected Discharge Plan: Home w Home Health Services (Home is- [facility] [place].) Barriers to Discharge: No Barriers Identified  Expected Discharge Plan and Services In-house Referral: Clinical Social Work     Living arrangements for the past 2 months: Assisted Living Facility ([facility] [place]) Expected Discharge Date: 12/16/22                           [facility] Agency: [facility] Date HH Agency Contacted: 12/15/22 Time HH Agency Contacted: 1500 Representative spoke with at [facility] Agency: Bjorn [phone]   Social Determinants of Health (SDOH) Interventions SDOH Screenings   Food Insecurity: No Food Insecurity (12/16/2022)  Housing: Low Risk  (12/16/2022)  Transportation Needs: No Transportation Needs (12/16/2022)  Utilities: Not At Risk (12/16/2022)  Tobacco Use: Low Risk  (12/14/2022)    Readmission Risk Interventions     No data to display

## 2022-12-16 NOTE — Evaluation (Signed)
Occupational Therapy Evaluation Patient Details Name: Ana Franco MRN: [id number] DOB: 1925/12/18 Today's Date: 12/16/2022   History of Present Illness Pt is a 89 y.o. female admitted 12/14/22 with SOB; workup for CHF exacerbation. Of note, recent influenza dx 3 wks prior. Other PMH includes L BKA, afib on Eliquis, HTN, CAD, TIA/CVA, HLD.   Clinical Impression   Pt reports needing assist with ADLs at baseline, and uses RW for short distance mobility. Pt with prosthetic and sock in room this session, needing set up -mod A for ADLs, mod A for bed mobility, and mod A for step pivot transfer x2 with RW. Pt left on BSC at end of session with RN and NT aware. Pt presenting with impairments listed below, will follow acutely. Recommend HHOT at ALF at d/c if able to provide level of assist needed, if not will need SNF.     Recommendations for follow up therapy are one component of a multi-disciplinary discharge planning process, led by the attending physician.  Recommendations may be updated based on patient status, additional functional criteria and insurance authorization.   Assistance Recommended at Discharge Intermittent Supervision/Assistance  Patient can return home with the following A lot of help with walking and/or transfers;A lot of help with bathing/dressing/bathroom;Assistance with cooking/housework;Direct supervision/assist for financial management;Direct supervision/assist for medications management;Help with stairs or ramp for entrance    Functional Status Assessment  Patient has had a recent decline in their functional status and demonstrates the ability to make significant improvements in function in a reasonable and predictable amount of time.  Equipment Recommendations  None recommended by OT (pt has all needed DME)    Recommendations for Other Services PT consult     Precautions / Restrictions Precautions Precautions: Fall;Other (comment) Precaution Comments: h/o L BKA (has  prosthetic in room but does not have sock, so will not fit) Restrictions Weight Bearing Restrictions: No      Mobility Bed Mobility Overal bed mobility: Needs Assistance Bed Mobility: Supine to Sit, Sit to Supine     Supine to sit: Mod assist     General bed mobility comments: mod A, assist to scoot hips forward    Transfers Overall transfer level: Needs assistance Equipment used: Rolling walker (2 wheels) Transfers: Sit to/from Stand, Bed to chair/wheelchair/BSC Sit to Stand: Mod assist     Step pivot transfers: Mod assist     General transfer comment: mod A for 2 step pivot transfers to chair and then to [facility]      Balance Overall balance assessment: Needs assistance Sitting-balance support: Bilateral upper extremity supported Sitting balance-Leahy Scale: Fair   Postural control: Posterior lean Standing balance support: During functional activity, Reliant on assistive device for balance Standing balance-Leahy Scale: Poor Standing balance comment: reliant on external support                           ADL either performed or assessed with clinical judgement   ADL Overall ADL's : Needs assistance/impaired Eating/Feeding: Set up;Sitting   Grooming: Set up;Sitting   Upper Body Bathing: Sitting;Minimal assistance   Lower Body Bathing: Moderate assistance;Sitting/lateral leans   Upper Body Dressing : Minimal assistance;Sitting   Lower Body Dressing: Moderate assistance;Sitting/lateral leans   Toilet Transfer: Moderate assistance;Stand-pivot;Rolling walker (2 wheels);BSC/3in1   Toileting- Clothing Manipulation and Hygiene: Maximal assistance       Functional mobility during ADLs: Moderate assistance;Rolling walker (2 wheels)       Vision   Vision Assessment?: No  apparent visual deficits     Perception Perception Perception Tested?: No   Praxis Praxis Praxis tested?: Not tested    Pertinent Vitals/Pain Pain Assessment Pain Assessment:  Faces Pain Score: 4  Faces Pain Scale: Hurts little more Pain Descriptors / Indicators: Sore Pain Intervention(s): Limited activity within patient's tolerance, Monitored during session, Repositioned     Hand Dominance Right   Extremity/Trunk Assessment Upper Extremity Assessment Upper Extremity Assessment: Generalized weakness   Lower Extremity Assessment Lower Extremity Assessment: Defer to PT evaluation   Cervical / Trunk Assessment Cervical / Trunk Assessment: Kyphotic   Communication Communication Communication: No difficulties   Cognition Arousal/Alertness: Awake/alert Behavior During Therapy: WFL for tasks assessed/performed Overall Cognitive Status: Within Functional Limits for tasks assessed                                       General Comments  VSS on RA    Exercises     Shoulder Instructions      Home Living Family/patient expects to be discharged to:: Assisted living                             Home Equipment: Rolling Walker (2 wheels);Wheelchair - Sport and exercise psychologist Comments: staff assists with ADLs/medications/meals, son provides transportation      Prior Functioning/Environment Prior Level of Function : Needs assist             Mobility Comments: ambulating short distances to/from bathroom with RW and LLE prosthetic; also uses w/c. pt reports she stopped working with PT there because of R foot wounds and they wanted her to avoid wearing a shoe (?) ADLs Comments: does not go to dining hall, typically eats in room (meals provided). staff assists with bathing on shower seat 2x/wk; pt reports mod indep with toileting        OT Problem List: Decreased range of motion;Decreased strength;Impaired balance (sitting and/or standing);Decreased activity tolerance      OT Treatment/Interventions: Self-care/ADL training;Therapeutic exercise;Energy conservation;DME and/or AE instruction;Therapeutic  activities;Balance training;Patient/family education    OT Goals(Current goals can be found in the care plan section) Acute Rehab OT Goals Patient Stated Goal: none stated OT Goal Formulation: With patient Time For Goal Achievement: 12/30/22 Potential to Achieve Goals: Good ADL Goals Pt Will Perform Upper Body Dressing: with supervision;sitting Pt Will Perform Lower Body Dressing: with supervision;sitting/lateral leans;sit to/from stand Pt Will Transfer to Toilet: with supervision;ambulating;regular height toilet Additional ADL Goal #1: pt will perform bed mobility with min guard A in prep for ADLs  OT Frequency: Min 1X/week    Co-evaluation              AM-PAC OT "6 Clicks" Daily Activity     Outcome Measure Help from another person eating meals?: None Help from another person taking care of personal grooming?: A Little Help from another person toileting, which includes using toliet, bedpan, or urinal?: A Lot Help from another person bathing (including washing, rinsing, drying)?: A Lot Help from another person to put on and taking off regular upper body clothing?: A Little Help from another person to put on and taking off regular lower body clothing?: A Lot 6 Click Score: 16   End of Session Equipment Utilized During Treatment: Rolling walker (2 wheels);Gait belt Nurse Communication: Mobility status  Activity Tolerance: Patient tolerated treatment well Patient left:  Other (comment) (on BSC with call bell in reach, RN and NT notified, pt wanting to sit for a while and attempt BM)  OT Visit Diagnosis: Unsteadiness on feet (R26.81);Other abnormalities of gait and mobility (R26.89);Muscle weakness (generalized) (M62.81)                Time: 1610-9604 OT Time Calculation (min): 31 min Charges:  OT General Charges $OT Visit: 1 Visit OT Evaluation $OT Eval Moderate Complexity: 1 Mod OT Treatments $Self Care/Home Management : 8-22 mins  Carver Fila, OTD, OTR/L SecureChat  Preferred Acute Rehab [phone]   Carver Fila Koonce 12/16/2022, 8:55 AM

## 2022-12-16 NOTE — Discharge Summary (Signed)
Physician Discharge Summary   Patient: Ana Franco MRN: [id number] DOB: Dec 24, 1925  Admit date:     12/14/2022  Discharge date: 12/16/22  Discharge Physician: Alberteen Sam   PCP: Gaspar Garbe, MD     Recommendations at discharge:  Follow up with Cardiology service on May 17 for CHF Follow up with PCP Dr. Wylene Simmer in 1 week Obtain BMP in 3 days and fax to Dr. Wylene Simmer Dr. Wylene Simmer: Adjust Lasix and antihypertensives as appropriate     Discharge Diagnoses: Principal Problem:   Acute diastolic CHF (congestive heart failure) (HCC) Active Problems:   Essential hypertension, benign   Dyslipidemia   Depression   Permanent atrial fibrillation (HCC)   Hyponatremia   Chronic kidney disease CKD IIIb   Normocytic anemia   Coronary artery disease   Hx of BKA, left (HCC)   Hypokalemia     Hospital Course: Ana Franco is a 89 y.o. F with permAF on Eliquis, hx L BKA, CAD, HTN, hx TIA, HLD, and influenza 3 weeks ago who presented with 1 day increased SOB with exertion, found to have CHF.      * Acute diastolic CHF (congestive heart failure) (HCC) Admitted with SOB with exertion, cardiomegaly, pulmonary edema and hypoxia.  Procal negative x2, antibiotics stopped.    Echo shows preserved EF.  Started on IV lasix and diuresed 3.5L, weaned off O2.  Transitioned to oral Lasix. - Check BMP in 3 days and adjust Lasix as appropriate     Hypokalemia Discharged on K supplement.   Hx of BKA, left (HCC)    Coronary artery disease Stable on apixaban, zetia, statin, BB  Normocytic anemia Hgb stable relative to baseline, due to CKD, no clinical bleeding  Chronic kidney disease CKD IIIb Cr stable relative to baseline, 1.1-1.4.  AKI ruled out  Hyponatremia Mild, asymptomatic  Permanent atrial fibrillation (HCC) On apixaban and metoprolol  Depression On Celexa  Dyslipidemia On Zetia, simvastatin  Essential hypertension, benign BP high normal, doses of  metoprolol and amlodipine reduced with new Lasix.            The [facility] Controlled Substances Registry was reviewed for this patient prior to discharge.   Consultants: Cardiology Procedures performed: Echo  Disposition: Assisted living Diet recommendation:  Discharge Diet Orders (From admission, onward)     Start     Ordered   12/16/22 0000  Diet - low sodium heart healthy        12/16/22 0959             DISCHARGE MEDICATION: Allergies as of 12/16/2022   No Known Allergies      Medication List     TAKE these medications    Acetaminophen 325 MG Chew Chew 650 mg by mouth every 6 (six) hours as needed (pain, fever, headache).   amLODipine 5 MG tablet Commonly known as: NORVASC Take 1 tablet (5 mg total) by mouth daily. Hold for SBP < 100 What changed:  additional instructions Another medication with the same name was removed. Continue taking this medication, and follow the directions you see here.   apixaban 5 MG Tabs tablet Commonly known as: ELIQUIS Take 1 tablet (5 mg total) by mouth 2 (two) times daily.   Baclofen 5 MG Tabs Take 5 mg by mouth every 8 (eight) hours as needed (pain/muscle spasms).   beta carotene 08657 UNIT capsule Take 25,000 Units by mouth daily.   Biofreeze 4 % Gel Generic drug: Menthol (Topical Analgesic) Apply 1 application  topically every 6 (six) hours as needed (neck pain).   CALCIUM CARBONATE-VITAMIN D3 PO Take 1 tablet by mouth every morning.   cholecalciferol 25 MCG (1000 UNIT) tablet Commonly known as: VITAMIN D3 Take 1,000 Units by mouth every evening.   citalopram 20 MG tablet Commonly known as: CELEXA Take 10 mg by mouth daily.   docusate sodium 100 MG capsule Commonly known as: COLACE Take 100 mg by mouth 2 (two) times daily as needed for mild constipation.   esomeprazole 40 MG capsule Commonly known as: NEXIUM Take 40 mg by mouth daily.   ezetimibe-simvastatin 10-20 MG tablet Commonly known  as: VYTORIN Take 1 tablet by mouth every evening.   furosemide 20 MG tablet Commonly known as: LASIX Take 1 tablet (20 mg total) by mouth daily. Start taking on: Dec 17, 2022   gabapentin 100 MG capsule Commonly known as: NEURONTIN Take 200 mg by mouth 3 (three) times daily.   lidocaine 4 % cream Commonly known as: LMX Apply 1 Application topically every 6 (six) hours as needed (lower back pain).   metoprolol tartrate 25 MG tablet Commonly known as: LOPRESSOR Take 1 tablet (25 mg total) by mouth 2 (two) times daily. Hold for SBP < 105 or HR < 60 What changed:  medication strength how much to take additional instructions   nitroGLYCERIN 0.4 MG SL tablet Commonly known as: NITROSTAT Place 0.4 mg under the tongue every 5 (five) minutes as needed for chest pain.   ondansetron 4 MG disintegrating tablet Commonly known as: ZOFRAN-ODT Take 4 mg by mouth every 6 (six) hours as needed for nausea or vomiting.   polyethylene glycol 17 g packet Commonly known as: MIRALAX / GLYCOLAX Take 17 g by mouth daily as needed for mild constipation.   vitamin B-12 500 MCG tablet Commonly known as: CYANOCOBALAMIN Take 500 mcg by mouth every morning.   White Petroleum Jelly Gel Apply 1 application  topically 2 (two) times daily. Apply vaseline to right foot twice daily due to dry skin.        Follow-up Information     Alver Sorrow, NP Follow up.   Specialty: Cardiology Why: Hospital follow-up with Cardiology scheduld for 12/27/2022 at 2:20pm. Please arrive 15 minutes early for check-in. If this date/ time does not work for you, please call our office to reschedule. Contact information: [address] [place] [address] [phone]         Tisovec, Adelfa Koh, MD. Schedule an appointment as soon as possible for a visit in 1 week(s).   Specialty: Internal Medicine Contact information: [address] [place] [address] [phone]                  Discharge Instructions     Diet - low sodium heart healthy   Complete by: As directed    Discharge instructions   Complete by: As directed    **IMPORTANT DISCHARGE INSTRUCTIONS**   From Dr. Maryfrances Bunnell: You were admitted for shortness of breath Here, we did a chest x-ray, echocardiogram (ultrasound of the heart) and blood work, which confirmed that this was from a small flare of congestive heart failure  This was treated with diuretics (Lasix) and improved  You should see your primary doctor in 1 week  Get labs checked this Thursday before that appointment  Go see the heart specialists in 2 weeks on May 17 (see below in the To Do section)  Reduce the doses of 2 of your blood pressure medicines REDUCE  your amlodipine dose  REDUCE your metoprolol dose  Ask Dr. Wylene Simmer when you see him if you should adjust those back   Increase activity slowly   Complete by: As directed        Discharge Exam: Filed Weights   12/14/22 0029 12/15/22 0448 12/16/22 0501  Weight: 77.1 kg 81.2 kg 82.2 kg    General: Pt is alert, awake, not in acute distress, kyphotic, BKA with prosthesis on today. Cardiovascular: RRR, nl S1-S2, no murmurs appreciated.   No LE edema.   Respiratory: Normal respiratory rate and rhythm.  CTAB without rales or wheezes. Abdominal: Abdomen soft and non-tender.  No distension or HSM.   Neuro/Psych: Strength symmetric in upper and lower extremities.  Judgment and insight appear normal.   Condition at discharge: stable  The results of significant diagnostics from this hospitalization (including imaging, microbiology, ancillary and laboratory) are listed below for reference.   Imaging Studies: ECHOCARDIOGRAM COMPLETE  Result Date: 12/14/2022    ECHOCARDIOGRAM REPORT   Patient Name:   ROSEMARI TRAEGER Date of Exam: 12/14/2022 Medical Rec #:  [id number]      Height:       68.0 in Accession #:    [id number]     Weight:       170.0 lb Date of Birth:  03-Dec-1925      BSA:           1.907 m Patient Age:    89 years       BP:           129/51 mmHg Patient Gender: F              HR:           60 bpm. Exam Location:  Inpatient Procedure: 2D Echo, Cardiac Doppler and Color Doppler Indications:    CHF-Acute Diastolic I50.31  History:        Patient has prior history of Echocardiogram examinations, most                 recent 01/07/2021. CHF, CAD and Previous Myocardial Infarction,                 Arrythmias:Atrial Fibrillation; Risk Factors:Hypertension,                 Dyslipidemia and Non-Smoker.  Sonographer:    Aron Baba Referring Phys: [id number] Darlin Drop  Sonographer Comments: Image acquisition challenging due to respiratory motion and Image acquisition challenging due to patient body habitus. IMPRESSIONS  1. Left ventricular ejection fraction, by estimation, is 60 to 65%. The left ventricle has normal function. The left ventricle has no regional wall motion abnormalities. There is moderate left ventricular hypertrophy. Left ventricular diastolic parameters are indeterminate.  2. Right ventricular systolic function is normal. The right ventricular size is normal. There is normal pulmonary artery systolic pressure. The estimated right ventricular systolic pressure is 35.2 mmHg.  3. Left atrial size was severely dilated.  4. Right atrial size was severely dilated.  5. The mitral valve is normal in structure. Mild mitral valve regurgitation. No evidence of mitral stenosis. Severe mitral annular calcification.  6. The aortic valve is tricuspid. There is moderate calcification of the aortic valve. There is moderate thickening of the aortic valve. Aortic valve regurgitation is moderate. Aortic valve sclerosis is present, with no evidence of aortic valve stenosis.  7. The inferior vena cava is dilated in size with >50% respiratory variability, suggesting right atrial pressure of 8 mmHg. Comparison(s):  No significant change from prior study. Prior images reviewed side by side. FINDINGS  Left  Ventricle: Left ventricular ejection fraction, by estimation, is 60 to 65%. The left ventricle has normal function. The left ventricle has no regional wall motion abnormalities. The left ventricular internal cavity size was normal in size. There is  moderate left ventricular hypertrophy. Left ventricular diastolic parameters are indeterminate. Right Ventricle: The right ventricular size is normal. No increase in right ventricular wall thickness. Right ventricular systolic function is normal. There is normal pulmonary artery systolic pressure. The tricuspid regurgitant velocity is 2.61 m/s, and  with an assumed right atrial pressure of 8 mmHg, the estimated right ventricular systolic pressure is 35.2 mmHg. Left Atrium: Left atrial size was severely dilated. Right Atrium: Right atrial size was severely dilated. Pericardium: There is no evidence of pericardial effusion. Mitral Valve: The mitral valve is normal in structure. Severe mitral annular calcification. Mild mitral valve regurgitation. No evidence of mitral valve stenosis. Tricuspid Valve: The tricuspid valve is normal in structure. Tricuspid valve regurgitation is mild . No evidence of tricuspid stenosis. Aortic Valve: The aortic valve is tricuspid. There is moderate calcification of the aortic valve. There is moderate thickening of the aortic valve. Aortic valve regurgitation is moderate. Aortic valve sclerosis is present, with no evidence of aortic valve stenosis. Pulmonic Valve: The pulmonic valve was normal in structure. Pulmonic valve regurgitation is not visualized. No evidence of pulmonic stenosis. Aorta: The aortic root is normal in size and structure. Venous: The inferior vena cava is dilated in size with greater than 50% respiratory variability, suggesting right atrial pressure of 8 mmHg. IAS/Shunts: No atrial level shunt detected by color flow Doppler.  LEFT VENTRICLE PLAX 2D LVIDd:         3.90 cm   Diastology LVIDs:         2.90 cm   LV e' medial:     15.60 cm/s LV PW:         1.50 cm   LV E/e' medial:  9.0 LV IVS:        1.10 cm   LV e' lateral:   10.00 cm/s LVOT diam:     2.00 cm   LV E/e' lateral: 14.1 LV SV:         74 LV SV Index:   39 LVOT Area:     3.14 cm  RIGHT VENTRICLE RV S prime:     10.70 cm/s TAPSE (M-mode): 1.5 cm LEFT ATRIUM              Index        RIGHT ATRIUM           Index LA diam:        5.30 cm  2.78 cm/m   RA Area:     27.30 cm LA Vol (A2C):   102.0 ml 53.48 ml/m  RA Volume:   87.20 ml  45.72 ml/m LA Vol (A4C):   187.0 ml 98.04 ml/m LA Biplane Vol: 143.0 ml 74.97 ml/m  AORTIC VALVE LVOT Vmax:   90.30 cm/s LVOT Vmean:  60.400 cm/s LVOT VTI:    0.237 m  AORTA Ao Root diam: 3.20 cm Ao Asc diam:  3.20 cm MITRAL VALVE                TRICUSPID VALVE MV Area (PHT): 3.99 cm     TR Peak grad:   27.2 mmHg MV Decel Time: 190 msec     TR Vmax:  261.00 cm/s MR Peak grad: 91.4 mmHg MR Vmax:      478.00 cm/s   SHUNTS MV E velocity: 141.00 cm/s  Systemic VTI:  0.24 m MV A velocity: 48.00 cm/s   Systemic Diam: 2.00 cm MV E/A ratio:  2.94 Donato Schultz MD Electronically signed by Donato Schultz MD Signature Date/Time: 12/14/2022/3:25:59 PM    Final    DG Chest 2 View  Result Date: 12/14/2022 CLINICAL DATA:  Shortness of breath EXAM: CHEST - 2 VIEW COMPARISON:  12/14/2022 FINDINGS: Cardiomegaly, vascular congestion. Right suprahilar and right lower lobe airspace disease again noted. Questionable left perihilar opacity. No visible significant effusions or acute bony abnormality. IMPRESSION: Cardiomegaly with vascular congestion. Right perihilar and lower lobe airspace opacities with questionable left perihilar opacity. Findings concerning for pneumonia (favored) or edema. Electronically Signed   By: Charlett Nose M.D.   On: 12/14/2022 02:42   DG Chest Port 1 View  Result Date: 12/14/2022 CLINICAL DATA:  sob EXAM: PORTABLE CHEST 1 VIEW COMPARISON:  Chest x-ray 01/06/2021. FINDINGS: The heart and mediastinal contours are unchanged. Aortic  calcification. Right upper lung zone and retrocardiac airspace opacity. No pulmonary edema. Possible trace pleural effusions. No pneumothorax. No acute osseous abnormality. IMPRESSION: 1. Low lung volumes with right upper lung zone and retrocardiac airspace opacity. Possible trace pleural effusions bilaterally. Recommend x-ray PA and lateral view of the chest with improved inspiratory effort for improved evaluation. 2.  Aortic Atherosclerosis (ICD10-I70.0). Electronically Signed   By: Tish Frederickson M.D.   On: 12/14/2022 01:26    Microbiology: Results for orders placed or performed during the hospital encounter of 01/06/21  Resp Panel by RT-PCR (Flu A&B, Covid) Nasopharyngeal Swab     Status: None   Collection Time: 01/06/21 12:32 PM   Specimen: Nasopharyngeal Swab; Nasopharyngeal(NP) swabs in vial transport medium  Result Value Ref Range Status   SARS Coronavirus 2 by RT PCR NEGATIVE NEGATIVE Final    Comment: (NOTE) SARS-CoV-2 target nucleic acids are NOT DETECTED.  The SARS-CoV-2 RNA is generally detectable in upper respiratory specimens during the acute phase of infection. The lowest concentration of SARS-CoV-2 viral copies this assay can detect is 138 copies/mL. A negative result does not preclude SARS-Cov-2 infection and should not be used as the sole basis for treatment or other patient management decisions. A negative result may occur with  improper specimen collection/handling, submission of specimen other than nasopharyngeal swab, presence of viral mutation(s) within the areas targeted by this assay, and inadequate number of viral copies(<138 copies/mL). A negative result must be combined with clinical observations, patient history, and epidemiological information. The expected result is Negative.  Fact Sheet for Patients:  BloggerCourse.com  Fact Sheet for Healthcare Providers:  SeriousBroker.it  This test is no t yet  approved or cleared by the [place] FDA and  has been authorized for detection and/or diagnosis of SARS-CoV-2 by FDA under an Emergency Use Authorization (EUA). This EUA will remain  in effect (meaning this test can be used) for the duration of the COVID-19 declaration under Section 564(b)(1) of the Act, 21 U.S.C.section 360bbb-3(b)(1), unless the authorization is terminated  or revoked sooner.       Influenza A by PCR NEGATIVE NEGATIVE Final   Influenza B by PCR NEGATIVE NEGATIVE Final    Comment: (NOTE) The Xpert Xpress SARS-CoV-2/FLU/RSV plus assay is intended as an aid in the diagnosis of influenza from Nasopharyngeal swab specimens and should not be used as a sole basis for treatment. Nasal washings and  aspirates are unacceptable for Xpert Xpress SARS-CoV-2/FLU/RSV testing.  Fact Sheet for Patients: BloggerCourse.com  Fact Sheet for Healthcare Providers: SeriousBroker.it  This test is not yet approved or cleared by the [place] FDA and has been authorized for detection and/or diagnosis of SARS-CoV-2 by FDA under an Emergency Use Authorization (EUA). This EUA will remain in effect (meaning this test can be used) for the duration of the COVID-19 declaration under Section 564(b)(1) of the Act, 21 U.S.C. section 360bbb-3(b)(1), unless the authorization is terminated or revoked.  Performed at [facility], [address], [place], [place] [address]   SARS CORONAVIRUS 2 (TAT 6-24 HRS) Nasopharyngeal Nasopharyngeal Swab     Status: None   Collection Time: 01/09/21  1:01 PM   Specimen: Nasopharyngeal Swab  Result Value Ref Range Status   SARS Coronavirus 2 NEGATIVE NEGATIVE Final    Comment: (NOTE) SARS-CoV-2 target nucleic acids are NOT DETECTED.  The SARS-CoV-2 RNA is generally detectable in upper and lower respiratory specimens during the acute phase of infection. Negative results do not preclude SARS-CoV-2  infection, do not rule out co-infections with other pathogens, and should not be used as the sole basis for treatment or other patient management decisions. Negative results must be combined with clinical observations, patient history, and epidemiological information. The expected result is Negative.  Fact Sheet for Patients: HairSlick.no  Fact Sheet for Healthcare Providers: quierodirigir.com  This test is not yet approved or cleared by the [place] FDA and  has been authorized for detection and/or diagnosis of SARS-CoV-2 by FDA under an Emergency Use Authorization (EUA). This EUA will remain  in effect (meaning this test can be used) for the duration of the COVID-19 declaration under Se ction 564(b)(1) of the Act, 21 U.S.C. section 360bbb-3(b)(1), unless the authorization is terminated or revoked sooner.  Performed at [facility], [address], [place], [place] [address]     Labs: CBC: Recent Labs  Lab 12/14/22 0047 12/15/22 0141  WBC 8.8 6.9  NEUTROABS 5.9  --   HGB 9.4* 10.2*  HCT 28.6* 31.1*  MCV 91.1 90.7  PLT 197 194   Basic Metabolic Panel: Recent Labs  Lab 12/14/22 0047 12/15/22 0141 12/16/22 0209  NA 131* 136 133*  K 4.3 3.3* 3.8  CL 99 100 100  CO2 22 28 25   GLUCOSE 107* 94 112*  BUN 14 12 14   CREATININE 1.16* 1.08* 1.32*  CALCIUM 8.7* 8.5* 8.4*   Liver Function Tests: Recent Labs  Lab 12/14/22 0047  AST 27  ALT 10  ALKPHOS 75  BILITOT 1.0  PROT 5.8*  ALBUMIN 2.8*   CBG: No results for input(s): "GLUCAP" in the last 168 hours.  Discharge time spent: approximately 35 minutes spent on discharge counseling, evaluation of patient on day of discharge, and coordination of discharge planning with nursing, social work, pharmacy and case management  Signed: Alberteen Sam, MD [facility] 12/16/2022

## 2022-12-16 NOTE — Progress Notes (Signed)
[facility] night cross cover note:   I was notified by RN that this patient, who is actively being diuresed, is unable to ambulate as she does not currently have a sock that fits her left lower extremity prosthesis.  As a consequence, staff is having difficulty mobilizing the patient.  She does not currently have a Foley catheter or purwick.  In this context, I conveyed to RN that is okay to place a purwick at this time.   Newton Pigg, DO Hospitalist

## 2022-12-16 NOTE — Progress Notes (Signed)
Report given to Cammy Copa, Charity fundraiser at [facility]. [facility] picking pt up currently. Pt going to rm A6 at facility.

## 2022-12-16 NOTE — Progress Notes (Signed)
Heart Failure Navigator Progress Note  Assessed for Heart & Vascular TOC clinic readiness.  Patient does not meet criteria due to EF 60-65%, has a scheduled [facility] appointment on 12/27/2022. .   Navigator will sign off at this time.   Rhae Hammock, BSN, Scientist, clinical (histocompatibility and immunogenetics) Only

## 2022-12-27 ENCOUNTER — Ambulatory Visit (HOSPITAL_BASED_OUTPATIENT_CLINIC_OR_DEPARTMENT_OTHER): Payer: Medicare Other | Admitting: Family

## 2022-12-27 ENCOUNTER — Encounter (HOSPITAL_BASED_OUTPATIENT_CLINIC_OR_DEPARTMENT_OTHER): Payer: Self-pay | Admitting: Family

## 2022-12-27 VITALS — BP 144/62 | HR 75 | Ht 68.0 in | Wt 179.0 lb

## 2022-12-27 DIAGNOSIS — I5032 Chronic diastolic (congestive) heart failure: Secondary | ICD-10-CM

## 2022-12-27 DIAGNOSIS — I10 Essential (primary) hypertension: Secondary | ICD-10-CM | POA: Diagnosis not present

## 2022-12-27 DIAGNOSIS — D6859 Other primary thrombophilia: Secondary | ICD-10-CM | POA: Diagnosis not present

## 2022-12-27 DIAGNOSIS — I4821 Permanent atrial fibrillation: Secondary | ICD-10-CM | POA: Diagnosis not present

## 2022-12-27 NOTE — Patient Instructions (Signed)
Medication Instructions:  Your physician has recommended you make the following change in your medication:   Take Furosemide 20mg  daily. Take an additional Furosemide 20mg  as needed for weight gain of 2 lbs overnight or 5 lbs in one week.   *If you need a refill on your cardiac medications before your next appointment, please call your pharmacy*  Testing/Procedures: Your echocardiogram in the hospital showed your heart was pumping well but not relaxing well. This is called diastolic heart failure.   It also showed your mitral valve was mildly leaky and your aortic valve was moderately leaky. We prevent this from worsening by keeping blood pressure well controlled   Follow-Up: At [facility], you and your health needs are our priority.  As part of our continuing mission to provide you with exceptional heart care, we have created designated Provider Care Teams.  These Care Teams include your primary Cardiologist (physician) and Advanced Practice Providers (APPs -  Physician Assistants and Nurse Practitioners) who all work together to provide you with the care you need, when you need it.  We recommend signing up for the patient portal called "MyChart".  Sign up information is provided on this After Visit Summary.  MyChart is used to connect with patients for Virtual Visits (Telemedicine).  Patients are able to view lab/test results, encounter notes, upcoming appointments, etc.  Non-urgent messages can be sent to your provider as well.   To learn more about what you can do with MyChart, go to [email or web address].    Your next appointment:   3 month(s)  Provider:   Jodelle Red, MD    Other Instructions  Recommend weighing daily and keeping a log. If you gain 2 pounds overnight or 5 pounds in 1 week, take an extra Furosemide 20mg .   Check BP daily and if persistently >130/80 please let us know.  Recommend 2 L fluid restriction (64oz) and low sodium, heart healthy  diet.  Keep up the great work with physical therapy!

## 2022-12-27 NOTE — Progress Notes (Signed)
Office Visit    Patient Name: Ana Franco Date of Encounter: 12/27/2022  PCP:  Sherron Monday, MD   [facility]  Cardiologist:  Jodelle Red, MD  Advanced Practice Provider:  No care team member to display Electrophysiologist:  None      Chief Complaint    Ana Franco is a 89 y.o. 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)    Chronic venous insufficiency    Depression    Difficulty in walking    Displaced trimalleolar fracture of left lower leg    Essential (primary) hypertension    GERD (gastroesophageal reflux disease)    History of blood transfusion    02/2018    History of falling    Hyperlipidemia    Hyperlipidemia    Hypertension    Infection and inflammatory reaction due to other internal orthopedic prosthetic devices, implants and grafts, subsequent encounter    Iron deficiency    Major depressive disorder    Muscle weakness (generalized)    Myocardial infarction (HCC)    nonstemi mi - 02/19/2018 at [facility] after ankle fracture    Nutritional deficiency, unspecified    Obesity    Other lack of coordination    Unspecified atrial fibrillation (HCC)    Unspecified osteoarthritis, unspecified site    Past Surgical History:  Procedure Laterality Date   AMPUTATION Left 04/10/2018   Procedure: LEFT BELOW KNEE AMPUTATION;  Surgeon: Nadara Mustard, MD;  Location: [facility] OR;  Service: Orthopedics;  Laterality: Left;   APPENDECTOMY     APPLICATION OF WOUND VAC Left 03/31/2018   Procedure: APPLICATION OF WOUND VAC;  Surgeon: Sheral Apley, MD;  Location: [facility] ORS;  Service: Orthopedics;  Laterality: Left;   HARDWARE REMOVAL Left 03/31/2018   Procedure: HARDWARE REMOVAL;  Surgeon: Sheral Apley, MD;  Location: [facility] ORS;  Service:  Orthopedics;  Laterality: Left;   IRRIGATION AND DEBRIDEMENT FOOT Left 03/31/2018   Procedure: IRRIGATION AND DEBRIDEMENT OF LEFT ANKLE;  Surgeon: Sheral Apley, MD;  Location: [facility] ORS;  Service: Orthopedics;  Laterality: Left;   ORIF ANKLE FRACTURE Right 06/13/2014   Procedure: OPEN REDUCTION INTERNAL FIXATION (ORIF) BIMALLEOLAR ANKLE FRACTURE;  Surgeon: Loreta Ave, MD;  Location: [facility] OR;  Service: Orthopedics;  Laterality: Right;    Allergies  No Known Allergies  History of Present Illness    Ana Franco is a 89 y.o. female with a hx of CAD, hypokalemia, prior left BKA, diastolic heart failure, aortic and mitral regurgitation, normocytic anemia, CKD 3B, hyponatremia, permanent atrial fibrillation, PVC bigeminy, depression, hyperlipidemia, hypertension last seen while hospitalized.  Admitted 5/4 - 12/16/2022 with acute on chronic diastolic heart failure diuresed 3.5 L able to wean off O2.  Echo 12/14/2022 LVEF 60 to 65% LVS P 35.2 mmHg, severe biatrial enlargement, moderate AI, mild MR she was discharged on oral Lasix and potassium supplement.  Doses of metoprolol and amlodipine were reduced with no addition of Lasix.  Discharge weight 191 pounds.  Presents today for follow up. Feeling well since discharge and working on stamina with PT. Drinks cranberry juice and coffee in the morning then water with total fluid intake <2L. Following low sodium diet. We reviewed diastolic heart failure and management plan. She  feels her energy levels are improved since discharge. Stable exertional dyspnea but no new edema, orthopnea, PND.   EKGs/Labs/Other Studies Reviewed:   The following studies were reviewed today: Cardiac Studies & Procedures       ECHOCARDIOGRAM  ECHOCARDIOGRAM COMPLETE 12/14/2022  Narrative ECHOCARDIOGRAM REPORT    Patient Name:   Ana Franco Date of Exam: 12/14/2022 Medical Rec #:  [id number]      Height:       68.0 in Accession #:    [id number]     Weight:       170.0  lb Date of Birth:  [birth year]      BSA:          1.907 m Patient Age:    89 years       BP:           129/51 mmHg Patient Gender: F              HR:           60 bpm. Exam Location:  Inpatient  Procedure: 2D Echo, Cardiac Doppler and Color Doppler  Indications:    CHF-Acute Diastolic I50.31  History:        Patient has prior history of Echocardiogram examinations, most recent 01/07/2021. CHF, CAD and Previous Myocardial Infarction, Arrythmias:Atrial Fibrillation; Risk Factors:Hypertension, Dyslipidemia and Non-Smoker.  Sonographer:    Aron Baba Referring Phys: [id number] Darlin Drop   Sonographer Comments: Image acquisition challenging due to respiratory motion and Image acquisition challenging due to patient body habitus. IMPRESSIONS   1. Left ventricular ejection fraction, by estimation, is 60 to 65%. The left ventricle has normal function. The left ventricle has no regional wall motion abnormalities. There is moderate left ventricular hypertrophy. Left ventricular diastolic parameters are indeterminate. 2. Right ventricular systolic function is normal. The right ventricular size is normal. There is normal pulmonary artery systolic pressure. The estimated right ventricular systolic pressure is 35.2 mmHg. 3. Left atrial size was severely dilated. 4. Right atrial size was severely dilated. 5. The mitral valve is normal in structure. Mild mitral valve regurgitation. No evidence of mitral stenosis. Severe mitral annular calcification. 6. The aortic valve is tricuspid. There is moderate calcification of the aortic valve. There is moderate thickening of the aortic valve. Aortic valve regurgitation is moderate. Aortic valve sclerosis is present, with no evidence of aortic valve stenosis. 7. The inferior vena cava is dilated in size with >50% respiratory variability, suggesting right atrial pressure of 8 mmHg.  Comparison(s): No significant change from prior study. Prior images reviewed  side by side.  FINDINGS Left Ventricle: Left ventricular ejection fraction, by estimation, is 60 to 65%. The left ventricle has normal function. The left ventricle has no regional wall motion abnormalities. The left ventricular internal cavity size was normal in size. There is moderate left ventricular hypertrophy. Left ventricular diastolic parameters are indeterminate.  Right Ventricle: The right ventricular size is normal. No increase in right ventricular wall thickness. Right ventricular systolic function is normal. There is normal pulmonary artery systolic pressure. The tricuspid regurgitant velocity is 2.61 m/s, and with an assumed right atrial pressure of 8 mmHg, the estimated right ventricular systolic pressure is 35.2 mmHg.  Left Atrium: Left atrial size was severely dilated.  Right Atrium: Right atrial size was severely dilated.  Pericardium: There is no evidence of pericardial effusion.  Mitral Valve: The mitral valve is normal in structure. Severe mitral annular calcification. Mild mitral valve regurgitation. No evidence of mitral valve stenosis.  Tricuspid Valve: The tricuspid valve is normal in structure. Tricuspid valve regurgitation is mild . No evidence of tricuspid stenosis.  Aortic Valve: The aortic valve is tricuspid. There is moderate calcification of the aortic valve. There is moderate thickening of the aortic valve. Aortic valve regurgitation is moderate. Aortic valve sclerosis is present, with no evidence of aortic valve stenosis.  Pulmonic Valve: The pulmonic valve was normal in structure. Pulmonic valve regurgitation is not visualized. No evidence of pulmonic stenosis.  Aorta: The aortic root is normal in size and structure.  Venous: The inferior vena cava is dilated in size with greater than 50% respiratory variability, suggesting right atrial pressure of 8 mmHg.  IAS/Shunts: No atrial level shunt detected by color flow Doppler.   LEFT VENTRICLE PLAX  2D LVIDd:         3.90 cm   Diastology LVIDs:         2.90 cm   LV e' medial:    15.60 cm/s LV PW:         1.50 cm   LV E/e' medial:  9.0 LV IVS:        1.10 cm   LV e' lateral:   10.00 cm/s LVOT diam:     2.00 cm   LV E/e' lateral: 14.1 LV SV:         74 LV SV Index:   39 LVOT Area:     3.14 cm   RIGHT VENTRICLE RV S prime:     10.70 cm/s TAPSE (M-mode): 1.5 cm  LEFT ATRIUM              Index        RIGHT ATRIUM           Index LA diam:        5.30 cm  2.78 cm/m   RA Area:     27.30 cm LA Vol (A2C):   102.0 ml 53.48 ml/m  RA Volume:   87.20 ml  45.72 ml/m LA Vol (A4C):   187.0 ml 98.04 ml/m LA Biplane Vol: 143.0 ml 74.97 ml/m AORTIC VALVE LVOT Vmax:   90.30 cm/s LVOT Vmean:  60.400 cm/s LVOT VTI:    0.237 m  AORTA Ao Root diam: 3.20 cm Ao Asc diam:  3.20 cm  MITRAL VALVE                TRICUSPID VALVE MV Area (PHT): 3.99 cm     TR Peak grad:   27.2 mmHg MV Decel Time: 190 msec     TR Vmax:        261.00 cm/s MR Peak grad: 91.4 mmHg MR Vmax:      478.00 cm/s   SHUNTS MV E velocity: 141.00 cm/s  Systemic VTI:  0.24 m MV A velocity: 48.00 cm/s   Systemic Diam: 2.00 cm MV E/A ratio:  2.94  Donato Schultz MD Electronically signed by Donato Schultz MD Signature Date/Time: 12/14/2022/3:25:59 PM    Final              EKG:  EKG is not ordered today.   Recent Labs: 12/14/2022: ALT 10; B Natriuretic Peptide 486.5 12/15/2022: Hemoglobin 10.2; Platelets 194 12/16/2022: BUN 14; Creatinine, Ser 1.32; Potassium 3.8; Sodium 133  Recent Lipid Panel    Component Value Date/Time   CHOL 95 01/07/2021 0126   TRIG 52 01/07/2021 0126   HDL 41 01/07/2021 0126   CHOLHDL 2.3 01/07/2021 0126   VLDL 10 01/07/2021 0126   LDLCALC  44 01/07/2021 0126    Risk Assessment/Calculations:   CHA2DS2-VASc Score = 7   This indicates a 11.2% annual risk of stroke. The patient's score is based upon: CHF History: 0 HTN History: 1 Diabetes History: 0 Stroke History: 2 Vascular Disease History:  1 Age Score: 2 Gender Score: 1     Home Medications   Current Meds  Medication Sig   Acetaminophen 325 MG CHEW Chew 650 mg by mouth every 6 (six) hours as needed (pain, fever, headache).   amLODipine (NORVASC) 5 MG tablet Take 1 tablet (5 mg total) by mouth daily. Hold for SBP < 100   apixaban (ELIQUIS) 5 MG TABS tablet Take 1 tablet (5 mg total) by mouth 2 (two) times daily.   Baclofen 5 MG TABS Take 5 mg by mouth every 8 (eight) hours as needed (pain/muscle spasms).   beta carotene 29562 UNIT capsule Take 25,000 Units by mouth daily.   CALCIUM+D3 500-10 MG-MCG TABS Take 1 tablet by mouth in the morning.   cholecalciferol (VITAMIN D3) 25 MCG (1000 UNIT) tablet Take 1,000 Units by mouth every evening.   citalopram (CELEXA) 20 MG tablet Take 10 mg by mouth daily.   docusate sodium (COLACE) 100 MG capsule Take 100 mg by mouth 2 (two) times daily as needed for mild constipation.   esomeprazole (NEXIUM) 40 MG capsule Take 40 mg by mouth daily.    ezetimibe-simvastatin (VYTORIN) 10-20 MG tablet Take 1 tablet by mouth every evening.   furosemide (LASIX) 20 MG tablet Take 1 tablet (20 mg total) by mouth daily.   gabapentin (NEURONTIN) 100 MG capsule Take 200 mg by mouth 3 (three) times daily.   lidocaine (LMX) 4 % cream Apply 1 Application topically every 6 (six) hours as needed (lower back pain).   Menthol, Topical Analgesic, (BIOFREEZE) 4 % GEL Apply 1 application  topically every 6 (six) hours as needed (neck pain).   metoprolol tartrate (LOPRESSOR) 25 MG tablet Take 1 tablet (25 mg total) by mouth 2 (two) times daily. Hold for SBP < 105 or HR < 60   nitroGLYCERIN (NITROSTAT) 0.4 MG SL tablet Place 0.4 mg under the tongue every 5 (five) minutes as needed for chest pain.   ondansetron (ZOFRAN-ODT) 4 MG disintegrating tablet Take 4 mg by mouth every 6 (six) hours as needed for nausea or vomiting.   polyethylene glycol (MIRALAX / GLYCOLAX) 17 g packet Take 17 g by mouth daily as needed for mild  constipation.   potassium chloride (KLOR-CON) 20 MEQ packet Take 20 mEq by mouth in the morning.   vitamin B-12 (CYANOCOBALAMIN) 500 MCG tablet Take 500 mcg by mouth every morning.   White Petrolatum (WHITE PETROLEUM JELLY) GEL Apply 1 application  topically 2 (two) times daily. Apply vaseline to right foot twice daily due to dry skin.     Review of Systems      All other systems reviewed and are otherwise negative except as noted above.  Physical Exam    VS:  BP (!) 144/62   Pulse 75   Ht 5\' 8"  (1.727 m)   Wt 179 lb (81.2 kg)   BMI 27.22 kg/m  , BMI Body mass index is 27.22 kg/m.  Wt Readings from Last 3 Encounters:  12/27/22 179 lb (81.2 kg)  12/16/22 181 lb 3.5 oz (82.2 kg)  03/20/22 186 lb 8.2 oz (84.6 kg)    GEN: Well nourished, well developed, in no acute distress. HEENT: normal. Neck: Supple, no JVD, carotid bruits, or  masses. Cardiac: RRR, no murmurs, rubs, or gallops. No clubbing, cyanosis, edema.  Radials/PT 2+ and equal bilaterally.  Respiratory:  Respirations regular and unlabored, clear to auscultation bilaterally. GI: Soft, nontender, nondistended. MS: No deformity or atrophy. Prior L transtibial amputation. Skin: Warm and dry, no rash. Neuro:  Strength and sensation are intact. Psych: Normal affect.  Assessment & Plan    HFpEF-grossly euvolemic on exam.  Continue furosemide 20 mg daily with additional 20 mg as needed for weight gain of 2 pounds overnight or 5 pounds in 1 week.  Continue metoprolol tartrate 25 mg twice daily. Low sodium diet, fluid restriction <2L, and daily weights encouraged. Educated to contact our office for weight gain of 2 lbs overnight or 5 lbs in one week.   PVC-reports no palpitations.  Continue metoprolol tartrate 25 mg twice daily.  Permanent atrial fibrillation/hypercoagulable state-continue metoprolol tartrate 20 mg twice daily, Eliquis 5 mg twice daily.  Does not meet dose reduction criteria.  Denies bleeding  complications.  HTN-BP elevated in clinic but BP by readings at SNF at goal of less than 130/80.  Educated SNF to report BP persistently greater than 130/80.  If BP persistently above goal could consider increasing dose of amlodipine however in the interim continue 5 mg daily.  HLD-continue Zetia-simvastatin 10-20 mg daily.        Disposition: Follow up in 3 month(s) with Jodelle Red, MD or APP.  Signed, Alver Sorrow, NP 12/27/2022, 2:13 PM  [facility]

## 2023-01-13 ENCOUNTER — Encounter (HOSPITAL_BASED_OUTPATIENT_CLINIC_OR_DEPARTMENT_OTHER): Payer: Self-pay

## 2023-01-13 NOTE — Progress Notes (Addendum)
Addendum 01/13/23  Labs received from [facility]  07/17/2022 with total cholesterol 117, exercise 90, HDL 40, LDL 59.  Continue present cholesterol medications. 07/17/2022 creatinine 1.0, K4.1, ALT 11, AST 30   Average BP based on 8 readings in May of 129/62 which is at goal <130/80.  Continue current antihypertensive regimen.    Alver Sorrow, NP

## 2023-03-28 ENCOUNTER — Ambulatory Visit (HOSPITAL_BASED_OUTPATIENT_CLINIC_OR_DEPARTMENT_OTHER): Payer: Medicare Other | Admitting: Cardiology

## 2023-05-28 ENCOUNTER — Ambulatory Visit (HOSPITAL_BASED_OUTPATIENT_CLINIC_OR_DEPARTMENT_OTHER): Payer: Medicare Other | Admitting: Cardiology

## 2023-09-03 ENCOUNTER — Ambulatory Visit (HOSPITAL_BASED_OUTPATIENT_CLINIC_OR_DEPARTMENT_OTHER): Payer: Medicare Other | Admitting: Cardiology

## 2023-10-03 ENCOUNTER — Inpatient Hospital Stay (HOSPITAL_COMMUNITY)
Admission: EM | Admit: 2023-10-03 | Discharge: 2023-10-08 | DRG: 682 | Disposition: A | Discharge status: Skilled Nursing Facility | Payer: Medicare Other | Source: Skilled Nursing Facility | Attending: Internal Medicine | Admitting: Internal Medicine

## 2023-10-03 ENCOUNTER — Other Ambulatory Visit: Payer: Self-pay

## 2023-10-03 ENCOUNTER — Encounter (HOSPITAL_COMMUNITY): Payer: Self-pay | Admitting: Emergency Medicine

## 2023-10-03 ENCOUNTER — Emergency Department (HOSPITAL_COMMUNITY): Payer: Medicare Other

## 2023-10-03 DIAGNOSIS — Z1612 Extended spectrum beta lactamase (ESBL) resistance: Secondary | ICD-10-CM | POA: Diagnosis present

## 2023-10-03 DIAGNOSIS — R768 Other specified abnormal immunological findings in serum: Secondary | ICD-10-CM | POA: Diagnosis not present

## 2023-10-03 DIAGNOSIS — D638 Anemia in other chronic diseases classified elsewhere: Secondary | ICD-10-CM | POA: Diagnosis not present

## 2023-10-03 DIAGNOSIS — E782 Mixed hyperlipidemia: Secondary | ICD-10-CM

## 2023-10-03 DIAGNOSIS — K219 Gastro-esophageal reflux disease without esophagitis: Secondary | ICD-10-CM | POA: Diagnosis present

## 2023-10-03 DIAGNOSIS — E871 Hypo-osmolality and hyponatremia: Secondary | ICD-10-CM | POA: Diagnosis not present

## 2023-10-03 DIAGNOSIS — Z89512 Acquired absence of left leg below knee: Secondary | ICD-10-CM

## 2023-10-03 DIAGNOSIS — Z79899 Other long term (current) drug therapy: Secondary | ICD-10-CM

## 2023-10-03 DIAGNOSIS — E785 Hyperlipidemia, unspecified: Secondary | ICD-10-CM | POA: Diagnosis present

## 2023-10-03 DIAGNOSIS — E860 Dehydration: Secondary | ICD-10-CM | POA: Diagnosis present

## 2023-10-03 DIAGNOSIS — Z8616 Personal history of COVID-19: Secondary | ICD-10-CM

## 2023-10-03 DIAGNOSIS — I10 Essential (primary) hypertension: Secondary | ICD-10-CM

## 2023-10-03 DIAGNOSIS — Z66 Do not resuscitate: Secondary | ICD-10-CM | POA: Diagnosis present

## 2023-10-03 DIAGNOSIS — Z7901 Long term (current) use of anticoagulants: Secondary | ICD-10-CM

## 2023-10-03 DIAGNOSIS — N179 Acute kidney failure, unspecified: Principal | ICD-10-CM

## 2023-10-03 DIAGNOSIS — I252 Old myocardial infarction: Secondary | ICD-10-CM

## 2023-10-03 DIAGNOSIS — N390 Urinary tract infection, site not specified: Secondary | ICD-10-CM | POA: Diagnosis present

## 2023-10-03 DIAGNOSIS — I480 Paroxysmal atrial fibrillation: Secondary | ICD-10-CM

## 2023-10-03 DIAGNOSIS — F32A Depression, unspecified: Secondary | ICD-10-CM

## 2023-10-03 DIAGNOSIS — R6510 Systemic inflammatory response syndrome (SIRS) of non-infectious origin without acute organ dysfunction: Secondary | ICD-10-CM

## 2023-10-03 DIAGNOSIS — N1831 Chronic kidney disease, stage 3a: Secondary | ICD-10-CM | POA: Diagnosis present

## 2023-10-03 DIAGNOSIS — E861 Hypovolemia: Secondary | ICD-10-CM | POA: Diagnosis present

## 2023-10-03 DIAGNOSIS — Z23 Encounter for immunization: Secondary | ICD-10-CM

## 2023-10-03 DIAGNOSIS — E8809 Other disorders of plasma-protein metabolism, not elsewhere classified: Secondary | ICD-10-CM | POA: Diagnosis present

## 2023-10-03 DIAGNOSIS — R110 Nausea: Secondary | ICD-10-CM | POA: Diagnosis not present

## 2023-10-03 DIAGNOSIS — A419 Sepsis, unspecified organism: Principal | ICD-10-CM | POA: Diagnosis present

## 2023-10-03 LAB — CBC
HCT: 31.5 % — ABNORMAL LOW (ref 36.0–46.0)
Hemoglobin: 10.3 g/dL — ABNORMAL LOW (ref 12.0–15.0)
MCH: 29.8 pg (ref 26.0–34.0)
MCHC: 32.7 g/dL (ref 30.0–36.0)
MCV: 91 fL (ref 80.0–100.0)
Platelets: 195 10*3/uL (ref 150–400)
RBC: 3.46 MIL/uL — ABNORMAL LOW (ref 3.87–5.11)
RDW: 13.7 % (ref 11.5–15.5)
WBC: 12.9 10*3/uL — ABNORMAL HIGH (ref 4.0–10.5)
nRBC: 0 % (ref 0.0–0.2)

## 2023-10-03 LAB — COMPREHENSIVE METABOLIC PANEL
ALT: 9 U/L (ref 0–44)
AST: 25 U/L (ref 15–41)
Albumin: 3 g/dL — ABNORMAL LOW (ref 3.5–5.0)
Alkaline Phosphatase: 58 U/L (ref 38–126)
Anion gap: 14 (ref 5–15)
BUN: 37 mg/dL — ABNORMAL HIGH (ref 8–23)
CO2: 22 mmol/L (ref 22–32)
Calcium: 8.7 mg/dL — ABNORMAL LOW (ref 8.9–10.3)
Chloride: 91 mmol/L — ABNORMAL LOW (ref 98–111)
Creatinine, Ser: 1.92 mg/dL — ABNORMAL HIGH (ref 0.44–1.00)
GFR, Estimated: 24 mL/min — ABNORMAL LOW (ref >60)
Glucose, Bld: 112 mg/dL — ABNORMAL HIGH (ref 70–99)
Potassium: 4.3 mmol/L (ref 3.5–5.1)
Sodium: 127 mmol/L — ABNORMAL LOW (ref 135–145)
Total Bilirubin: 0.8 mg/dL (ref 0.0–1.2)
Total Protein: 6.7 g/dL (ref 6.5–8.1)

## 2023-10-03 LAB — RESP PANEL BY RT-PCR (RSV, FLU A&B, COVID)  RVPGX2
Influenza A by PCR: NEGATIVE
Influenza B by PCR: NEGATIVE
Resp Syncytial Virus by PCR: NEGATIVE
SARS Coronavirus 2 by RT PCR: POSITIVE — AB

## 2023-10-03 MED ORDER — ACETAMINOPHEN 650 MG RE SUPP: 650.0000 mg | Freq: Four times a day (QID) | RECTAL | Status: DC | PRN

## 2023-10-03 MED ORDER — ONDANSETRON HCL 4 MG/2ML IJ SOLN
4.0000 mg | Freq: Four times a day (QID) | INTRAMUSCULAR | Status: DC | PRN
  Administered 2023-10-07 – 2023-10-08 (×2): 4 mg via INTRAVENOUS
  Filled 2023-10-03 (×2): qty 2

## 2023-10-03 MED ORDER — MELATONIN 3 MG PO TABS
3.0000 mg | ORAL_TABLET | Freq: Every evening | ORAL | Status: DC | PRN
  Administered 2023-10-05 – 2023-10-06 (×2): 3 mg via ORAL
  Filled 2023-10-03 (×2): qty 1

## 2023-10-03 MED ORDER — LACTATED RINGERS IV SOLN: INTRAVENOUS | Status: DC

## 2023-10-03 MED ORDER — ACETAMINOPHEN 325 MG PO TABS
650.0000 mg | ORAL_TABLET | Freq: Four times a day (QID) | ORAL | Status: DC | PRN
  Administered 2023-10-04 – 2023-10-08 (×7): 650 mg via ORAL
  Filled 2023-10-03 (×7): qty 2

## 2023-10-03 MED ORDER — SODIUM CHLORIDE 0.9 % IV BOLUS
500.0000 mL | Freq: Once | INTRAVENOUS | Status: AC
Start: 2023-10-03 — End: 2023-10-03
  Administered 2023-10-03: 500 mL via INTRAVENOUS

## 2023-10-03 MED ORDER — SODIUM CHLORIDE 0.9 % IV BOLUS
500.0000 mL | Freq: Once | INTRAVENOUS | Status: AC
  Administered 2023-10-03: 500 mL via INTRAVENOUS

## 2023-10-03 NOTE — ED Notes (Signed)
 ED TO INPATIENT HANDOFF REPORT  ED Nurse Name and Phone #:  Alan Ripper  [phone]  S Name/Age/Gender Ana Franco 89 y.o. female Room/Bed: 030C/030C  Code Status   Code Status: Limited: Do not attempt resuscitation (DNR) -DNR-LIMITED -Do Not Intubate/DNI   Home/SNF/Other Nursing Home Patient oriented to: self, place, time, and situation Is this baseline? Yes   Triage Complete: Triage complete  Chief Complaint AKI (acute kidney injury) [facility]) [N17.9]  Triage Note  Patient BIB EMS from [facility] for generalized body aches that have been going on for about 4 days.  Patient had labs and chest xray done today and some values were "critical" so patient was sent here.  Concerned for possible covid/flu exposure.  No pain at this time.    Sodium - 129 Chloride - 93 BUN - 37 Creatinine - 1.84   Allergies No Known Allergies  Level of Care/Admitting Diagnosis ED Disposition     ED Disposition  Admit   Condition  --   Comment  Hospital Area: [facility] [[id number]]  Level of Care: Telemetry Medical [[id number]]  May place patient in observation at [facility] or [facility] if equivalent level of care is available:: No  Covid Evaluation: Asymptomatic - no recent exposure (last 10 days) testing not required  Diagnosis: AKI (acute kidney injury) [facility]) [[id number]]  Admitting Physician: Angie Fava [[id number]]  Attending Physician: Angie Fava [[id number]]          B Medical/Surgery History 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)    Chronic venous insufficiency    Depression    Difficulty in walking    Displaced trimalleolar fracture of left lower leg    Essential (primary) hypertension    GERD (gastroesophageal reflux disease)    History of blood transfusion    02/2018    History of falling    Hyperlipidemia    Hyperlipidemia     Hypertension    Infection and inflammatory reaction due to other internal orthopedic prosthetic devices, implants and grafts, subsequent encounter    Iron deficiency    Major depressive disorder    Muscle weakness (generalized)    Myocardial infarction (HCC)    nonstemi mi - 02/19/2018 at [facility] after ankle fracture    Nutritional deficiency, unspecified    Obesity    Other lack of coordination    Unspecified atrial fibrillation (HCC)    Unspecified osteoarthritis, unspecified site    Past Surgical History:  Procedure Laterality Date   AMPUTATION Left 04/10/2018   Procedure: LEFT BELOW KNEE AMPUTATION;  Surgeon: Nadara Mustard, MD;  Location: [facility] OR;  Service: Orthopedics;  Laterality: Left;   APPENDECTOMY     APPLICATION OF WOUND VAC Left 03/31/2018   Procedure: APPLICATION OF WOUND VAC;  Surgeon: Sheral Apley, MD;  Location: [facility] ORS;  Service: Orthopedics;  Laterality: Left;   HARDWARE REMOVAL Left 03/31/2018   Procedure: HARDWARE REMOVAL;  Surgeon: Sheral Apley, MD;  Location: [facility] ORS;  Service: Orthopedics;  Laterality: Left;   IRRIGATION AND DEBRIDEMENT FOOT Left 03/31/2018   Procedure: IRRIGATION AND DEBRIDEMENT OF LEFT ANKLE;  Surgeon: Sheral Apley, MD;  Location: [facility];  Service: Orthopedics;  Laterality: Left;   ORIF ANKLE FRACTURE Right 06/13/2014   Procedure: OPEN REDUCTION INTERNAL FIXATION (ORIF) BIMALLEOLAR ANKLE FRACTURE;  Surgeon: Margarette Asal  Eulah Pont, MD;  Location: [facility] OR;  Service: Orthopedics;  Laterality: Right;     A IV Location/Drains/Wounds Patient Lines/Drains/Airways Status     Active Line/Drains/Airways     Name Placement date Placement time Site Days   Peripheral IV 10/03/23 20 G 1.88" Anterior;Left;Proximal Forearm 10/03/23  2014  Forearm  less than 1   Negative Pressure Wound Therapy Leg Left 04/10/18  0808  --  2002   External Urinary Catheter 04/04/18  0700  --  2008   External Urinary Catheter 01/08/21  --  --  998    External Urinary Catheter 12/14/22  0310  --  293            Intake/Output Last 24 hours No intake or output data in the 24 hours ending 10/03/23 2357  Labs/Imaging Results for orders placed or performed during the hospital encounter of 10/03/23 (from the past 48 hours)  CBC     Status: Abnormal   Collection Time: 10/03/23  8:15 PM  Result Value Ref Range   WBC 12.9 (H) 4.0 - 10.5 K/uL   RBC 3.46 (L) 3.87 - 5.11 MIL/uL   Hemoglobin 10.3 (L) 12.0 - 15.0 g/dL   HCT 40.9 (L) 81.1 - 91.4 %   MCV 91.0 80.0 - 100.0 fL   MCH 29.8 26.0 - 34.0 pg   MCHC 32.7 30.0 - 36.0 g/dL   RDW 78.2 95.6 - 21.3 %   Platelets 195 150 - 400 K/uL   nRBC 0.0 0.0 - 0.2 %    Comment: Performed at [facility], [address] [place], [place] [address]  Comprehensive metabolic panel     Status: Abnormal   Collection Time: 10/03/23  8:15 PM  Result Value Ref Range   Sodium 127 (L) 135 - 145 mmol/L   Potassium 4.3 3.5 - 5.1 mmol/L   Chloride 91 (L) 98 - 111 mmol/L   CO2 22 22 - 32 mmol/L   Glucose, Bld 112 (H) 70 - 99 mg/dL    Comment: Glucose reference range applies only to samples taken after fasting for at least 8 hours.   BUN 37 (H) 8 - 23 mg/dL   Creatinine, Ser 8.46 (H) 0.44 - 1.00 mg/dL   Calcium 8.7 (L) 8.9 - 10.3 mg/dL   Total Protein 6.7 6.5 - 8.1 g/dL   Albumin 3.0 (L) 3.5 - 5.0 g/dL   AST 25 15 - 41 U/L   ALT 9 0 - 44 U/L   Alkaline Phosphatase 58 38 - 126 U/L   Total Bilirubin 0.8 0.0 - 1.2 mg/dL   GFR, Estimated 24 (L) >60 mL/min    Comment: (NOTE) Calculated using the CKD-EPI Creatinine Equation (2021)    Anion gap 14 5 - 15    Comment: Performed at [facility], [address], [place], [place] [address]  Resp panel by RT-PCR (RSV, Flu A&B, Covid) Anterior Nasal Swab     Status: Abnormal   Collection Time: 10/03/23  8:15 PM   Specimen: Anterior Nasal Swab  Result Value Ref Range   SARS Coronavirus 2 by RT PCR POSITIVE (A) NEGATIVE   Influenza A by PCR NEGATIVE  NEGATIVE   Influenza B by PCR NEGATIVE NEGATIVE    Comment: (NOTE) The Xpert Xpress SARS-CoV-2/FLU/RSV plus assay is intended as an aid in the diagnosis of influenza from Nasopharyngeal swab specimens and should not be used as a sole basis for treatment. Nasal washings and aspirates are unacceptable for Xpert Xpress  SARS-CoV-2/FLU/RSV testing.  Fact Sheet for Patients: BloggerCourse.com  Fact Sheet for Healthcare Providers: SeriousBroker.it  This test is not yet approved or cleared by the [place] FDA and has been authorized for detection and/or diagnosis of SARS-CoV-2 by FDA under an Emergency Use Authorization (EUA). This EUA will remain in effect (meaning this test can be used) for the duration of the COVID-19 declaration under Section 564(b)(1) of the Act, 21 U.S.C. section 360bbb-3(b)(1), unless the authorization is terminated or revoked.     Resp Syncytial Virus by PCR NEGATIVE NEGATIVE    Comment: (NOTE) Fact Sheet for Patients: BloggerCourse.com  Fact Sheet for Healthcare Providers: SeriousBroker.it  This test is not yet approved or cleared by the [place] FDA and has been authorized for detection and/or diagnosis of SARS-CoV-2 by FDA under an Emergency Use Authorization (EUA). This EUA will remain in effect (meaning this test can be used) for the duration of the COVID-19 declaration under Section 564(b)(1) of the Act, 21 U.S.C. section 360bbb-3(b)(1), unless the authorization is terminated or revoked.  Performed at [facility], [address], [place], [place] [address]    DG Chest 2 View Result Date: 10/03/2023 CLINICAL DATA:  Generalized body aches, myalgia EXAM: CHEST - 2 VIEW COMPARISON:  12/14/2022 FINDINGS: Frontal and lateral views of the chest demonstrate stable enlargement the cardiac silhouette and dense calcification of the mitral annulus.  Chronic lung scarring greatest at the bases. No acute airspace disease, effusion, or pneumothorax. No acute bony abnormalities. IMPRESSION: 1. No acute intrathoracic process. Electronically Signed   By: Sharlet Salina M.D.   On: 10/03/2023 20:32    Pending Labs Unresulted Labs (From admission, onward)     Start     Ordered   10/04/23 0500  CBC with Differential/Platelet  Tomorrow morning,   R        10/03/23 2255   10/04/23 0500  Comprehensive metabolic panel  Tomorrow morning,   R        10/03/23 2255   10/04/23 0500  Magnesium  Tomorrow morning,   R        10/03/23 2255   10/03/23 2256  Magnesium  Add-on,   AD        10/03/23 2255            Vitals/Pain Today's Vitals   10/03/23 2300 10/03/23 2330 10/03/23 2343 10/03/23 2345  BP: (!) 130/52 (!) 138/55    Pulse: 78 72 79 78  Resp: (!) 22 18 (!) 23 15  Temp:   98.9 F (37.2 C)   TempSrc:   Oral   SpO2: 95% 95% 98% 95%  Weight:      Height:      PainSc:        Isolation Precautions No active isolations  Medications Medications  acetaminophen (TYLENOL) tablet 650 mg (has no administration in time range)    Or  acetaminophen (TYLENOL) suppository 650 mg (has no administration in time range)  melatonin tablet 3 mg (has no administration in time range)  ondansetron (ZOFRAN) injection 4 mg (has no administration in time range)  lactated ringers infusion (has no administration in time range)  sodium chloride 0.9 % bolus 500 mL (0 mLs Intravenous Stopped 10/03/23 2128)  sodium chloride 0.9 % bolus 500 mL (0 mLs Intravenous Stopped 10/03/23 2249)    Mobility walks with device     Focused Assessments   R Recommendations: See Admitting Provider Note  Report given to:   Additional Notes:

## 2023-10-03 NOTE — ED Triage Notes (Addendum)
  Patient BIB EMS from [facility] for generalized body aches that have been going on for about 4 days.  Patient had labs and chest xray done today and some values were "critical" so patient was sent here.  Concerned for possible covid/flu exposure.  No pain at this time.    Sodium - 129 Chloride - 93 BUN - 37 Creatinine - 1.84

## 2023-10-03 NOTE — ED Provider Notes (Signed)
 [place] EMERGENCY DEPARTMENT AT [facility] Provider Note   CSN: [id number] Arrival date & time: 10/03/23  1948     History  Chief Complaint  Patient presents with   Generalized Body Aches   Abnormal Lab    Ana Franco is a 89 y.o. female.   Abnormal Lab  Patient has a history of hypertension hyperlipidemia acid reflux myocardial infarction hypertension who presents to the ED for evaluation of abnormal laboratory values.  Patient resides at a nursing facility.  For the last 4 days apparently she has been having some complaints of bodyaches.  Patient states she is only having some mild symptoms in her wrist.  She thinks it might be her arthritis.  She is not having any trouble with any chest pain.  She denies any shortness of breath.  She denies any coughing.  No vomiting or diarrhea.  No abdominal pain no urinary symptoms.  Patient had laboratory test performed at the nursing facility.  Her electrolyte panel was abnormal.  She was sent to the ED for further evaluation    Home Medications Prior to Admission medications   Medication Sig Start Date End Date Taking? Authorizing Provider  Acetaminophen 325 MG CHEW Chew 650 mg by mouth every 6 (six) hours as needed (pain, fever, headache).    [provider]  amLODipine (NORVASC) 5 MG tablet Take 1 tablet (5 mg total) by mouth daily. Hold for SBP < 100 12/16/22 12/11/23  Danford, Earl Lites, MD  apixaban (ELIQUIS) 5 MG TABS tablet Take 1 tablet (5 mg total) by mouth 2 (two) times daily. 01/09/21   Tyrone Nine, MD  Baclofen 5 MG TABS Take 5 mg by mouth every 8 (eight) hours as needed (pain/muscle spasms).    [provider]  beta carotene 40981 UNIT capsule Take 25,000 Units by mouth daily. 12/07/21   [provider]  CALCIUM+D3 500-10 MG-MCG TABS Take 1 tablet by mouth in the morning. 12/16/22   [provider]  cholecalciferol (VITAMIN D3) 25 MCG (1000 UNIT) tablet Take 1,000 Units by mouth  every evening.    [provider]  citalopram (CELEXA) 20 MG tablet Take 10 mg by mouth daily.    [provider]  docusate sodium (COLACE) 100 MG capsule Take 100 mg by mouth 2 (two) times daily as needed for mild constipation.    [provider]  esomeprazole (NEXIUM) 40 MG capsule Take 40 mg by mouth daily.     [provider]  ezetimibe-simvastatin (VYTORIN) 10-20 MG tablet Take 1 tablet by mouth every evening. 11/17/21   [provider]  furosemide (LASIX) 20 MG tablet Take 1 tablet (20 mg total) by mouth daily. 12/17/22   Danford, Earl Lites, MD  gabapentin (NEURONTIN) 100 MG capsule Take 200 mg by mouth 3 (three) times daily. 11/28/21   [provider]  lidocaine (LMX) 4 % cream Apply 1 Application topically every 6 (six) hours as needed (lower back pain).    [provider]  Menthol, Topical Analgesic, (BIOFREEZE) 4 % GEL Apply 1 application  topically every 6 (six) hours as needed (neck pain).    [provider]  metoprolol tartrate (LOPRESSOR) 25 MG tablet Take 1 tablet (25 mg total) by mouth 2 (two) times daily. Hold for SBP < 105 or HR < 60 12/16/22   Danford, Earl Lites, MD  nitroGLYCERIN (NITROSTAT) 0.4 MG SL tablet Place 0.4 mg under the tongue every 5 (five) minutes as needed for chest  pain.    [provider]  ondansetron (ZOFRAN-ODT) 4 MG disintegrating tablet Take 4 mg by mouth every 6 (six) hours as needed for nausea or vomiting. 11/06/22   [provider]  polyethylene glycol (MIRALAX / GLYCOLAX) 17 g packet Take 17 g by mouth daily as needed for mild constipation. 01/09/21   Tyrone Nine, MD  potassium chloride (KLOR-CON) 20 MEQ packet Take 20 mEq by mouth in the morning.    [provider]  vitamin B-12 (CYANOCOBALAMIN) 500 MCG tablet Take 500 mcg by mouth every morning.    [provider]  White Petrolatum (WHITE PETROLEUM JELLY) GEL Apply 1 application  topically 2 (two)  times daily. Apply vaseline to right foot twice daily due to dry skin. 07/10/22   [provider]      Allergies    Patient has no known allergies.    Review of Systems   Review of Systems  Physical Exam Updated Vital Signs BP 130/60 (BP Location: Right Arm)   Pulse 79   Temp 99.2 F (37.3 C) (Oral)   Resp 20   Ht 1.727 m (5\' 8" )   Wt 81.2 kg   SpO2 96%   BMI 27.22 kg/m  Physical Exam Vitals and nursing note reviewed.  Constitutional:      General: She is not in acute distress.    Appearance: She is well-developed.  HENT:     Head: Normocephalic and atraumatic.     Right Ear: External ear normal.     Left Ear: External ear normal.  Eyes:     General: No scleral icterus.       Right eye: No discharge.        Left eye: No discharge.     Conjunctiva/sclera: Conjunctivae normal.  Neck:     Trachea: No tracheal deviation.  Cardiovascular:     Rate and Rhythm: Normal rate and regular rhythm.  Pulmonary:     Effort: Pulmonary effort is normal. No respiratory distress.     Breath sounds: Normal breath sounds. No stridor. No wheezing or rales.  Abdominal:     General: Bowel sounds are normal. There is no distension.     Palpations: Abdomen is soft.     Tenderness: There is no abdominal tenderness. There is no guarding or rebound.  Musculoskeletal:        General: No tenderness or deformity.     Cervical back: Neck supple.  Skin:    General: Skin is warm and dry.     Findings: No rash.  Neurological:     General: No focal deficit present.     Mental Status: She is alert.     Cranial Nerves: No cranial nerve deficit, dysarthria or facial asymmetry.     Sensory: No sensory deficit.     Motor: No abnormal muscle tone or seizure activity.     Coordination: Coordination normal.  Psychiatric:        Mood and Affect: Mood normal.     ED Results / Procedures / Treatments   Labs (all labs ordered are listed, but only abnormal results are displayed) Labs  Reviewed - No data to display  EKG None  Radiology No results found.  Procedures Procedures    Medications Ordered in ED Medications - No data to display  ED Course/ Medical Decision Making/ A&P Clinical Course as of 10/04/23 1716  Fri Oct 03, 2023  2136 Resp panel by RT-PCR (RSV, Flu A&B, Covid) Anterior Nasal SwabMarland Kitchen)  Covid positive [JK]  2136 Comprehensive metabolic panel(!) Creatinine elevated compared to previous.  His sodium decreasing compared to previous [JK]  2136 CBC(!) Hemoglobin stable [JK]  2137 Chest x-ray without acute abnormalities [JK]    Clinical Course User Index [JK] Linwood Dibbles, MD                                 Medical Decision Making Problems Addressed: AKI (acute kidney injury) [facility]): acute illness or injury that poses a threat to life or bodily functions COVID-19 virus antibody detected: undiagnosed new problem with uncertain prognosis Dehydration: acute illness or injury that poses a threat to life or bodily functions  Amount and/or Complexity of Data Reviewed Labs: ordered. Decision-making details documented in ED Course. Radiology: ordered and independent interpretation performed.  Risk Decision regarding hospitalization.   Patient presented to the ED for generalized myalgias.  Patient also had outpatient laboratory tests were abnormal.  Patient's labs notable for an AKI with elevated BUN and creatinine.  Patient also noted to have a component of hyponatremia.  Patient's COVID test is positive.  Unclear if this is acute as patient mention she had this a few weeks ago.  Patient does appear dehydrated.  She has been given IV fluids.  Case discussed with the medical service, Dr Arlean Hopping  for admission and further treatment        Final Clinical Impression(s) / ED Diagnoses Final diagnoses:  None    Rx / DC Orders ED Discharge Orders     None         Linwood Dibbles, MD 10/04/23 [phone]

## 2023-10-03 NOTE — H&P (Signed)
 History and Physical      Ana Franco WUJ:[id number] DOB: 14-Jun-1926 DOA: 10/03/2023; DOS: 10/03/2023  PCP: Sherron Monday, MD  Patient coming from: home   I have personally briefly reviewed patient's old medical records in [facility]  Chief Complaint: Worsening function per outpatient labs  HPI: Ana Franco is a 89 y.o. female with medical history significant for essential pretension, hyperlipidemia, paroxysmal atrial fibrillation chronically anticoagulated on Eliquis, anemia of chronic disease associated baseline hemoglobin range 9.5-12, who is admitted to [facility] on 10/03/2023 with acute kidney injury after presenting from SNF to [facility] ED for further evaluation management of outpatient labs that reflected interval worsening in renal function.    The patient conveys that she was diagnosed with COVID-19 infection 2 to 3 weeks ago, but has subsequently recovered from the cough and subjective fever that she was experiencing at that time.  Denies any residual or new shortness of breath, nor any new/residual subjective fever, chills, rigors, generalized myalgias.  Denies any recent chest pain.  She conveys that her SNF was performing routine test COVID which incidentally kidney function to be slightly lower than baseline, along with her to be brought to [facility] emergency department for further evaluation management of these laboratory findings.  She denies any recent focal weakness nor any new acute focal numbness/lesions.  No recent trauma.  No recent orthopnea, PND, or worsening of her peripheral edema.  No recent dysuria or gross hematuria.  Her medical history is notable for paroxysmal atrial fibrillation for which she is chronically anticoagulated on Eliquis.  She also has a history of essential hypertension, with outpatient medications that include Lopressor, amlodipine, Lasix.  She also has a history of depression for which she is on Celexa.  Per chart review,  her to most recent prior serum sodium results were found to be 133 on 12/16/2022 as well as 136 on 12/15/2022.  Per chart review, baseline creatinine range appears to be 1.1-1.3.    ED Course:  Vital signs in the ED were notable for the following: Afebrile; heart rates in the 70s to 80s; systolic blood pressures in the 1 teens to 130s; respiratory rate 14-23, oxygen saturation 95 to 99% on room air.  Labs were notable for the following: CMP was notable for the following: Sodium 127, potassium 4.3, chloride 91, bicarbonate 22, anion gap 14 creatinine 1.92 compared to most recent prior value of 1.32 on 12/16/2022, with presenting BUN/creatinine ratio noted to be 19.3, glucose 112, calcium adjusted for mild hypoalbuminemia noted to be 9.5, avidin 3.0.  Liver enzymes, were otherwise within normal limits.  CBC notable for will with cell count 12,900, hemoglobin 10.3 associated Neuraceq/Norocarp properties and nonelevated RDW, and relative to most recent prior hemoglobin value of 10.2 on 12/15/2022, bili at 195.  Influenza A/B as well as RSV PCR were negative.  COVID-19 PCR is positive.  Per my interpretation, EKG in ED demonstrated the following: Atrial fibrillation with heart rate 83, nonspecific T wave inversion in lead III, and no evidence of ST changes, including no evidence of ST elevation.  Imaging in the ED, per corresponding formal radiology read, was notable for the following: 2 view chest x-ray showed no evidence of acute cardiopulmonary process, including no evidence of infiltrate, edema, effusion, or pneumothorax.  While in the ED, the following were administered: We will saline x 1 L bolus.  Subsequently, the patient was admitted for further evaluation management of presenting acute kidney injury as well as acute hyponatremia  in the setting of patient's report of recent diagnosis of COVID-19 infection 2 to 3 weeks ago.    Review of Systems: As per HPI otherwise 10 point review of systems negative.    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)    Chronic venous insufficiency    Depression    Difficulty in walking    Displaced trimalleolar fracture of left lower leg    Essential (primary) hypertension    GERD (gastroesophageal reflux disease)    History of blood transfusion    02/2018    History of falling    Hyperlipidemia    Hyperlipidemia    Hypertension    Infection and inflammatory reaction due to other internal orthopedic prosthetic devices, implants and grafts, subsequent encounter    Iron deficiency    Major depressive disorder    Muscle weakness (generalized)    Myocardial infarction (HCC)    nonstemi mi - 02/19/2018 at [facility] after ankle fracture    Nutritional deficiency, unspecified    Obesity    Other lack of coordination    Unspecified atrial fibrillation (HCC)    Unspecified osteoarthritis, unspecified site     Past Surgical History:  Procedure Laterality Date   AMPUTATION Left 04/10/2018   Procedure: LEFT BELOW KNEE AMPUTATION;  Surgeon: Nadara Mustard, MD;  Location: [facility] OR;  Service: Orthopedics;  Laterality: Left;   APPENDECTOMY     APPLICATION OF WOUND VAC Left 03/31/2018   Procedure: APPLICATION OF WOUND VAC;  Surgeon: Sheral Apley, MD;  Location: [facility];  Service: Orthopedics;  Laterality: Left;   HARDWARE REMOVAL Left 03/31/2018   Procedure: HARDWARE REMOVAL;  Surgeon: Sheral Apley, MD;  Location: [facility];  Service: Orthopedics;  Laterality: Left;   IRRIGATION AND DEBRIDEMENT FOOT Left 03/31/2018   Procedure: IRRIGATION AND DEBRIDEMENT OF LEFT ANKLE;  Surgeon: Sheral Apley, MD;  Location: [facility];  Service: Orthopedics;  Laterality: Left;   ORIF ANKLE FRACTURE Right 06/13/2014   Procedure: OPEN REDUCTION INTERNAL FIXATION (ORIF) BIMALLEOLAR ANKLE FRACTURE;  Surgeon: Loreta Ave, MD;  Location: [facility] OR;  Service:  Orthopedics;  Laterality: Right;    Social History:  reports that she has never smoked. She has never used smokeless tobacco. She reports that she does not drink alcohol and does not use drugs.   No Known Allergies  History reviewed. No pertinent family history.  Family history reviewed and not pertinent    Prior to Admission medications   Medication Sig Start Date End Date Taking? Authorizing Provider  Acetaminophen 325 MG CHEW Chew 650 mg by mouth every 6 (six) hours as needed (pain, fever, headache).    [provider]  amLODipine (NORVASC) 5 MG tablet Take 1 tablet (5 mg total) by mouth daily. Hold for SBP < 100 12/16/22 12/11/23  Danford, Earl Lites, MD  apixaban (ELIQUIS) 5 MG TABS tablet Take 1 tablet (5 mg total) by mouth 2 (two) times daily. 01/09/21   Tyrone Nine, MD  Baclofen 5 MG TABS Take 5 mg by mouth every 8 (eight) hours as needed (pain/muscle spasms).    [provider]  beta carotene 16109 UNIT capsule Take 25,000 Units by mouth daily. 12/07/21   [provider]  CALCIUM+D3 500-10 MG-MCG TABS Take 1 tablet by mouth in the morning. 12/16/22   [provider]  cholecalciferol (VITAMIN D3) 25 MCG (1000 UNIT) tablet Take 1,000 Units by mouth every evening.    [provider]  citalopram (CELEXA) 20 MG tablet Take 10 mg by mouth daily.    [provider]  docusate sodium (COLACE) 100 MG capsule Take 100 mg by mouth 2 (two) times daily as needed for mild constipation.    [provider]  esomeprazole (NEXIUM) 40 MG capsule Take 40 mg by mouth daily.     [provider]  ezetimibe-simvastatin (VYTORIN) 10-20 MG tablet Take 1 tablet by mouth every evening. 11/17/21   [provider]  furosemide (LASIX) 20 MG tablet Take 1 tablet (20 mg total) by mouth daily. 12/17/22   Danford, Earl Lites, MD  gabapentin (NEURONTIN) 100 MG capsule Take 200 mg by mouth 3 (three) times daily. 11/28/21   [provider]  lidocaine (LMX) 4 % cream Apply 1 Application topically every 6 (six) hours as needed (lower back pain).    [provider]  Menthol, Topical Analgesic, (BIOFREEZE) 4 % GEL Apply 1 application  topically every 6 (six) hours as needed (neck pain).    [provider]  metoprolol tartrate (LOPRESSOR) 25 MG tablet Take 1 tablet (25 mg total) by mouth 2 (two) times daily. Hold for SBP < 105 or HR < 60 12/16/22   Danford, Earl Lites, MD  nitroGLYCERIN (NITROSTAT) 0.4 MG SL tablet Place 0.4 mg under the tongue every 5 (five) minutes as needed for chest pain.    [provider]  ondansetron (ZOFRAN-ODT) 4 MG disintegrating tablet Take 4 mg by mouth every 6 (six) hours as needed for nausea or vomiting. 11/06/22   [provider]  polyethylene glycol (MIRALAX / GLYCOLAX) 17 g packet Take 17 g by mouth daily as needed for mild constipation. 01/09/21   Tyrone Nine, MD  potassium chloride (KLOR-CON) 20 MEQ packet Take 20 mEq by mouth in the morning.    [provider]  vitamin B-12 (CYANOCOBALAMIN) 500 MCG tablet Take 500 mcg by mouth every morning.    [provider]  White Petrolatum (WHITE PETROLEUM JELLY) GEL Apply 1 application  topically 2 (two) times daily. Apply vaseline to right foot twice daily due to dry skin. 07/10/22   [provider]     Objective    Physical Exam: Vitals:   10/03/23 2145 10/03/23 2200 10/03/23 2230 10/03/23 2245  BP:  117/70 (!) 137/52   Pulse: 80 88 78 73  Resp: 13 (!) 23 (!) 26 (!) 25  Temp:      TempSrc:      SpO2: 97% 98% 96% 96%  Weight:      Height:        General: appears to be stated age; alert Skin: warm, dry, no rash Head:  AT/[place] Mouth:  Oral mucosa membranes appear dry, normal dentition Neck: supple; trachea midline Heart: Irregular, but rate controlled; did not appreciate any M/R/G Lungs: CTAB, did not appreciate any wheezes, rales, or rhonchi Abdomen: + BS; soft, ND,  NT Vascular: 2+ pedal pulses b/l; 2+ radial pulses b/l Extremities: no peripheral edema, no muscle wasting Neuro: strength and sensation intact in upper and lower extremities b/l     Labs on Admission: I have personally reviewed following labs and imaging studies  CBC: Recent Labs  Lab 10/03/23 2015  WBC 12.9*  HGB 10.3*  HCT 31.5*  MCV 91.0  PLT 195   Basic Metabolic Panel: Recent Labs  Lab 10/03/23 2015  NA 127*  K 4.3  CL 91*  CO2 22  GLUCOSE 112*  BUN 37*  CREATININE 1.92*  CALCIUM 8.7*   GFR: Estimated Creatinine Clearance: 19.6 mL/min (A) (by C-G formula based on SCr of 1.92 mg/dL (H)). Liver Function Tests: Recent Labs  Lab 10/03/23 2015  AST 25  ALT 9  ALKPHOS 58  BILITOT 0.8  PROT 6.7  ALBUMIN 3.0*   No results for input(s): "LIPASE", "AMYLASE" in the last 168 hours. No results for input(s): "AMMONIA" in the last 168 hours. Coagulation Profile: No results for input(s): "INR", "PROTIME" in the last 168 hours. Cardiac Enzymes: No results for input(s): "CKTOTAL", "CKMB", "CKMBINDEX", "TROPONINI" in the last 168 hours. BNP (last 3 results) No results for input(s): "PROBNP" in the last 8760 hours. HbA1C: No results for input(s): "HGBA1C" in the last 72 hours. CBG: No results for input(s): "GLUCAP" in the last 168 hours. Lipid Profile: No results for input(s): "CHOL", "HDL", "LDLCALC", "TRIG", "CHOLHDL", "LDLDIRECT" in the last 72 hours. Thyroid Function Tests: No results for input(s): "TSH", "T4TOTAL", "FREET4", "T3FREE", "THYROIDAB" in the last 72 hours. Anemia Panel: No results for input(s): "VITAMINB12", "FOLATE", "FERRITIN", "TIBC", "IRON", "RETICCTPCT" in the last 72 hours. Urine analysis:    Component Value Date/Time   COLORURINE YELLOW 06/13/2014 0018   APPEARANCEUR CLOUDY (A) 06/13/2014 0018   LABSPEC 1.018 06/13/2014 0018   PHURINE 5.0 06/13/2014 0018   GLUCOSEU NEGATIVE 06/13/2014 0018   HGBUR MODERATE (A) 06/13/2014 0018    BILIRUBINUR NEGATIVE 06/13/2014 0018   KETONESUR NEGATIVE 06/13/2014 0018   PROTEINUR NEGATIVE 06/13/2014 0018   UROBILINOGEN 0.2 06/13/2014 0018   NITRITE POSITIVE (A) 06/13/2014 0018   LEUKOCYTESUR LARGE (A) 06/13/2014 0018    Radiological Exams on Admission: DG Chest 2 View Result Date: 10/03/2023 CLINICAL DATA:  Generalized body aches, myalgia EXAM: CHEST - 2 VIEW COMPARISON:  12/14/2022 FINDINGS: Frontal and lateral views of the chest demonstrate stable enlargement the cardiac silhouette and dense calcification of the mitral annulus. Chronic lung scarring greatest at the bases. No acute airspace disease, effusion, or pneumothorax. No acute bony abnormalities. IMPRESSION: 1. No acute intrathoracic process. Electronically Signed   By: Sharlet Salina M.D.   On: 10/03/2023 20:32      Assessment/Plan   Principal Problem:   AKI (acute kidney injury) (HCC) Active Problems:   Essential hypertension   HLD (hyperlipidemia)   Depression   Paroxysmal atrial fibrillation (HCC)   Acute hyponatremia   SIRS (systemic inflammatory response syndrome) (HCC)   Anemia of chronic disease     #) Acute Kidney Injury: Relative to her baseline creatinine range 1.1-1.3, with most recent prior creatinine data point of 1.32 on 12/16/2022, outpatient labs earlier in the day found interval worsening renal function, associated with creatinine of 1.92.  Suspect that this is prerenal in nature, with clinical evidence of dehydration, exacerbated with outpatient use of Lasix in the context of suspected recent preceding in the context of the patient's report of diagnosis of COVID-19 infection 2 to 3 weeks ago.  Provide gentle IV fluids, and pursue additional diagnostic evaluation for her acute kidney injury, as further detailed below.  Plan: monitor strict I's & O's and daily weights. Attempt to avoid nephrotoxic agents.  Hold home Lasix for now.  Refrain from NSAIDs. Repeat CMP in the morning. Check serum magnesium  level.  Check urinalysis with microscopy.  Add-on random urine sodium and random urine creatinine.  Lactated Ringer's at 75 cc/h x 12 hours.                   #)  Acute hypo-osmolar hypovolemia hyponatremia: Presenting sodium level of 127 compared to most recent prior value of 133 on 12/16/2022.  As described above, suspect an element of hypovolumeia stemming from ostensible losses as a result of recent diagnosis of COVID-19 infection 2 to 3 weeks ago.  However, particular given this history of recent COVID-19 infection, differential also includes the possibility of SIADH.   in general, will provide gentle IV fluids to attend to suspected contribution from dehydration, while further evaluating for any additional contributing factors, including SIADH, as further detailed below.  Potential pharmacologic contributing factors include outpatient use of Lasix as well as Celexa.  There is no clinical or radiographic evidence at this time to suggest contributory acute volume overload, but will check BNP to further evaluate.  Does not appear to be associate with any acute focal neurologic deficits.  Plan: monitor strict I's and O's and daily weights.  check UA, random urine sodium, urine osmolality.  Check serum osmolality to confirm suspected hypoosmolar etiology.  Repeat CMP in the morning. Check TSH. Gentle IVF's in the form of lactated Ringer's at 75 cc/h x 12 hours. Check serum uric acid level, as SIADH can be associated with hypouricemia due to hyperuricuria.  Hold home Lasix and Celexa for now.  Add on BNP, as above.  Add on procalcitonin level.                    #) Recent COVID-19 diagnosis: The patient reports that she was recently diagnosed with COVID-19 infection 2 to 3 weeks ago, but has recovered from the mild symptoms that she was experiencing at that time.  At the time of this evening's presentation, she appears to be in no acute respiratory distress, is noted to be  maintaining oxygen sat in the high 90s on room air, well-appearing hemodynamically stable.  However, given her presenting acute hyponatremia, will add on a procalcitonin level to assess for any evidence of secondary bacterial pneumonia that may be contributing to the serum sodium abnormality via an SIADH mechanism.  Her COVID-19 PCR is found to be positive this evening, but it appears that this may residual finding in the setting of her recent preceding diagnosis of COVID-19 2 to 3 weeks ago.  Plan: Monitor strict I's and O's Daily weights.  Add on procalcitonin level, as above.  CBC in the morning.                   #) SIRS criteria present: Presentation associated with mild, with presenting with blood cell count of 12,900, along with tachypnea.  However, in the absence of e/o acute underlying infection at this time, including, chest x-ray that showed no evidence of infiltrate, criteria for sepsis not currently met.  As described above, she recently recovered from COVID-19 infection 2 to 3 weeks ago.  In this context, will add on procalcitonin level to assess for any contribution from a secondary bacterial pneumonia given the proximity to recent COVID-19 infection and also check urinalysis.  Noninfectious contributing factors leading to her mild leukocytosis, includes potential impact from hemoconcentration in the setting of clinical evidence of dehydration stemming from ostensible losses from recent action, as above.  patient appears hemodynamically stable at this time.  Consequently, will refrain from initiation of IV antibiotics at this time.    Plan: Repeat CBC with diff in the AM.  Monitor strict I's and O's and daily weights.  Monitor on telemetry. Refraining from IV abx for now, as above.  Check  procalcitonin level.  Check urinalysis.                  #) Essential Hypertension: documented h/o such, with outpatient antihypertensive regimen including amlodipine,  metoprolol tartrate, Lasix..  SBP's in the ED today: 1 teens 130s mmHg. in the setting of clinical evidence to suggest mild dehydration as well as acute kidney injury, will hold home Lasix for now.  Plan: Close monitoring of subsequent BP via routine VS. continue outpatient amlodipine and metoprolol titrate.  Hold home Lasix for now.  Monitor on telemetry.  Monitor strict I's and O's and daily weights.                 #) Hyperlipidemia: documented h/o such. On ezetimibe and simvastatin as outpatient.   Plan: continue home ezetimibe and simvastatin .                    #) Paroxysmal atrial fibrillation: Documented history of such. In setting of CHA2DS2-VASc score of  4, there is an indication for chronic anticoagulation for thromboembolic prophylaxis. Consistent with this, patient is chronically anticoagulated on Eliquis. Home AV nodal blocking regimen: Lopressor.  Most recent echocardiogram was performed May 2024, notable for LVEF 60 to 65%, indeterminate diastolic parameters, normal right ventricular systolic function, severely dilated bilateral atria, likely increasing risk for persistent versus permanent atrial fibrillation, also showing mild mitral regurgitation as well as moderate aortic regurgitation. Presenting EKG shows rate controlled atrial fibrillation without overt evidence of acute ischemic changes.   Will continue home Eliquis, but for now, we will reduce dose to 2.5 mg p.o. twice daily given the patient's age and interval decline in renal function.  Plan: monitor strict I's & O's and daily weights. CMP/CBC in AM. Check serum mag level. Continue home AV nodal blocking regimen.  Continue outpatient Eliquis, but reduce dose to 2.5 mg p.o. twice daily, as outlined above.  Monitor on telemetry.                 #) Anemia of chronic disease: Documented history of such, a/w with baseline hgb range 9.5-12, with presenting hgb consistent with this  range, in the absence of any overt evidence of active bleed.     Plan: Repeat CBC in the morning.                    #) Depression: documented h/o such. On Celexa as outpatient.  However, will hold home Celexa for now in the setting of the patient's presenting acute hyponatremia.  Plan: Hold home Celexa for now, as above.  Further evaluation management of presenting acute hyponatremia, as above.  Follow-up for result of TSH level.        DVT prophylaxis: SCD's plus outpatient Eliquis Code Status: dnr, Consistent with code status documentation from most recent prior hospitalization Family Communication: none Disposition Plan: Per Rounding Team Consults called: none;  Admission status: Observation     I SPENT GREATER THAN 75  MINUTES IN CLINICAL CARE TIME/MEDICAL DECISION-MAKING IN COMPLETING THIS ADMISSION.      Chaney Born Garion Wempe DO [facility]  From 7PM - 7AM   10/03/2023, 10:57 PM

## 2023-10-04 ENCOUNTER — Encounter (HOSPITAL_COMMUNITY): Payer: Self-pay | Admitting: Internal Medicine

## 2023-10-04 DIAGNOSIS — D638 Anemia in other chronic diseases classified elsewhere: Secondary | ICD-10-CM | POA: Diagnosis present

## 2023-10-04 DIAGNOSIS — R6510 Systemic inflammatory response syndrome (SIRS) of non-infectious origin without acute organ dysfunction: Secondary | ICD-10-CM | POA: Diagnosis present

## 2023-10-04 DIAGNOSIS — R7689 Other specified abnormal immunological findings in serum: Secondary | ICD-10-CM

## 2023-10-04 DIAGNOSIS — R768 Other specified abnormal immunological findings in serum: Principal | ICD-10-CM

## 2023-10-04 DIAGNOSIS — N179 Acute kidney failure, unspecified: Secondary | ICD-10-CM | POA: Diagnosis not present

## 2023-10-04 LAB — COMPREHENSIVE METABOLIC PANEL
ALT: 9 U/L (ref 0–44)
AST: 21 U/L (ref 15–41)
Albumin: 2.7 g/dL — ABNORMAL LOW (ref 3.5–5.0)
Alkaline Phosphatase: 53 U/L (ref 38–126)
Anion gap: 13 (ref 5–15)
BUN: 34 mg/dL — ABNORMAL HIGH (ref 8–23)
CO2: 21 mmol/L — ABNORMAL LOW (ref 22–32)
Calcium: 8.3 mg/dL — ABNORMAL LOW (ref 8.9–10.3)
Chloride: 95 mmol/L — ABNORMAL LOW (ref 98–111)
Creatinine, Ser: 1.61 mg/dL — ABNORMAL HIGH (ref 0.44–1.00)
GFR, Estimated: 29 mL/min — ABNORMAL LOW (ref >60)
Glucose, Bld: 107 mg/dL — ABNORMAL HIGH (ref 70–99)
Potassium: 4.1 mmol/L (ref 3.5–5.1)
Sodium: 129 mmol/L — ABNORMAL LOW (ref 135–145)
Total Bilirubin: 0.7 mg/dL (ref 0.0–1.2)
Total Protein: 5.9 g/dL — ABNORMAL LOW (ref 6.5–8.1)

## 2023-10-04 LAB — URINALYSIS, COMPLETE (UACMP) WITH MICROSCOPIC
Bilirubin Urine: NEGATIVE
Glucose, UA: NEGATIVE mg/dL
Ketones, ur: NEGATIVE mg/dL
Nitrite: NEGATIVE
Protein, ur: 30 mg/dL — AB
Specific Gravity, Urine: 1.009 (ref 1.005–1.030)
WBC, UA: >50 WBC/hpf (ref 0–5)
pH: 5 (ref 5.0–8.0)

## 2023-10-04 LAB — BRAIN NATRIURETIC PEPTIDE: B Natriuretic Peptide: 295.6 pg/mL — ABNORMAL HIGH (ref 0.0–100.0)

## 2023-10-04 LAB — CBC WITH DIFFERENTIAL/PLATELET
Abs Immature Granulocytes: 0.03 10*3/uL (ref 0.00–0.07)
Basophils Absolute: 0 10*3/uL (ref 0.0–0.1)
Basophils Relative: 0 %
Eosinophils Absolute: 0 10*3/uL (ref 0.0–0.5)
Eosinophils Relative: 0 %
HCT: 28.6 % — ABNORMAL LOW (ref 36.0–46.0)
Hemoglobin: 9.5 g/dL — ABNORMAL LOW (ref 12.0–15.0)
Immature Granulocytes: 0 %
Lymphocytes Relative: 15 %
Lymphs Abs: 1.6 10*3/uL (ref 0.7–4.0)
MCH: 30.4 pg (ref 26.0–34.0)
MCHC: 33.2 g/dL (ref 30.0–36.0)
MCV: 91.4 fL (ref 80.0–100.0)
Monocytes Absolute: 1.7 10*3/uL — ABNORMAL HIGH (ref 0.1–1.0)
Monocytes Relative: 15 %
Neutro Abs: 7.6 10*3/uL (ref 1.7–7.7)
Neutrophils Relative %: 70 %
Platelets: 131 10*3/uL — ABNORMAL LOW (ref 150–400)
RBC: 3.13 MIL/uL — ABNORMAL LOW (ref 3.87–5.11)
RDW: 13.9 % (ref 11.5–15.5)
WBC: 10.9 10*3/uL — ABNORMAL HIGH (ref 4.0–10.5)
nRBC: 0 % (ref 0.0–0.2)

## 2023-10-04 LAB — MAGNESIUM
Magnesium: 1.9 mg/dL (ref 1.7–2.4)
Magnesium: 1.8 mg/dL (ref 1.7–2.4)

## 2023-10-04 LAB — OSMOLALITY: Osmolality: 277 mosm/kg (ref 275–295)

## 2023-10-04 LAB — MRSA NEXT GEN BY PCR, NASAL: MRSA by PCR Next Gen: NOT DETECTED

## 2023-10-04 LAB — PROCALCITONIN: Procalcitonin: 0.41 ng/mL

## 2023-10-04 LAB — OSMOLALITY, URINE: Osmolality, Ur: 307 mosm/kg (ref 300–900)

## 2023-10-04 LAB — SODIUM, URINE, RANDOM: Sodium, Ur: 37 mmol/L

## 2023-10-04 LAB — CREATININE, URINE, RANDOM: Creatinine, Urine: 61 mg/dL

## 2023-10-04 LAB — TSH: TSH: 0.425 u[IU]/mL (ref 0.350–4.500)

## 2023-10-04 LAB — URIC ACID: Uric Acid, Serum: 10.7 mg/dL — ABNORMAL HIGH (ref 2.5–7.1)

## 2023-10-04 MED ORDER — SIMVASTATIN 20 MG PO TABS
20.0000 mg | ORAL_TABLET | Freq: Every day | ORAL | Status: DC
  Administered 2023-10-04 – 2023-10-07 (×4): 20 mg via ORAL
  Filled 2023-10-04 (×4): qty 1

## 2023-10-04 MED ORDER — APIXABAN 2.5 MG PO TABS
2.5000 mg | ORAL_TABLET | Freq: Two times a day (BID) | ORAL | Status: DC
  Administered 2023-10-04 – 2023-10-08 (×9): 2.5 mg via ORAL
  Filled 2023-10-04 (×9): qty 1

## 2023-10-04 MED ORDER — EZETIMIBE 10 MG PO TABS
10.0000 mg | ORAL_TABLET | Freq: Every day | ORAL | Status: DC
  Administered 2023-10-04 – 2023-10-07 (×4): 10 mg via ORAL
  Filled 2023-10-04 (×4): qty 1

## 2023-10-04 MED ORDER — MAGNESIUM SULFATE 2 GM/50ML IV SOLN
2.0000 g | Freq: Once | INTRAVENOUS | Status: AC
Start: 2023-10-04 — End: 2023-10-04
  Administered 2023-10-04: 2 g via INTRAVENOUS
  Filled 2023-10-04: qty 50

## 2023-10-04 MED ORDER — EZETIMIBE-SIMVASTATIN 10-20 MG PO TABS: 1.0000 | ORAL_TABLET | Freq: Every evening | ORAL | Status: DC

## 2023-10-04 MED ORDER — SODIUM CHLORIDE 0.9 % IV SOLN
1.0000 g | INTRAVENOUS | Status: DC
  Administered 2023-10-04 – 2023-10-06 (×3): 1 g via INTRAVENOUS
  Filled 2023-10-04 (×4): qty 10

## 2023-10-04 MED ORDER — PANTOPRAZOLE SODIUM 40 MG PO TBEC
40.0000 mg | DELAYED_RELEASE_TABLET | Freq: Every day | ORAL | Status: DC
  Administered 2023-10-04 – 2023-10-08 (×5): 40 mg via ORAL
  Filled 2023-10-04 (×5): qty 1

## 2023-10-04 MED ORDER — METOPROLOL TARTRATE 25 MG PO TABS
25.0000 mg | ORAL_TABLET | Freq: Two times a day (BID) | ORAL | Status: DC
  Administered 2023-10-04 – 2023-10-08 (×9): 25 mg via ORAL
  Filled 2023-10-04 (×9): qty 1

## 2023-10-04 NOTE — Progress Notes (Signed)
  Progress Note   Patient: Ana Franco [id number]:[id number] DOB: 16-Feb-1926 DOA: 10/03/2023     0 DOS: the patient was seen and examined on 10/04/2023   Brief hospital course: 89 y.o. female with medical history significant for essential pretension, hyperlipidemia, paroxysmal atrial fibrillation chronically anticoagulated on Eliquis, anemia of chronic disease associated baseline hemoglobin range 9.5-12, who is admitted to [facility] on 10/03/2023 with acute kidney injury after presenting from SNF to [facility] ED for further evaluation management of outpatient labs that reflected interval worsening in renal function.   Assessment and Plan: #) Acute Kidney Injury:  -Relative to her baseline creatinine range 1.1-1.3 -Presenting Cr of 1.92, down to 1.61 -Pt given gentle fluids overnight -holding home lasix  -encourage self hydration as tolerated -recheck bmet in AM   #) Hyponatremia:  -Presenting sodium level of 127  -Urine Na is elevated, urine osm and serum osm are both borderline low. Consistent with possible SIADH in the setting of recent COVID -Will fluid restrict to 1200cc -Recheck Na in AM   #) Recent COVID-19 diagnosis:  -The patient reports that she was recently diagnosed with COVID-19 infection 2 to 3 weeks ago, but has recovered from the mild symptoms  -Continue with supportive care   #) Sepsis with UTI present on admit:  -Pt presented with white blood cell count of 12,900, along with tachypnea.   -UA is suggestive of UTI with >50 WBC and large leuk with many bacteria -Urine cx pending -Cont on empiric rocephin -recheck cbc in AM   #) Essential Hypertension:  -continue outpatient amlodipine and metoprolol titrate.   -lasix on hold for now per above -Bp stable   #) Hyperlipidemia:  -continue home ezetimibe and simvastatin .     #) Paroxysmal atrial fibrillation:  -CHA2DS2-VASc score of  4 -continued on Eliquis -continue metoprolol as scheduled   #) Anemia of chronic  disease:  -Hemodynamically stable - Repeat CBC in the morning.    #) Depression:  -Celexa for now, as above.  -TSH 0.425    Subjective: Without complaints this AM  Physical Exam: Vitals:   10/04/23 0529 10/04/23 0840 10/04/23 0900 10/04/23 1536  BP: (!) 143/55 (!) 171/69  (!) 119/51  Pulse: 81 86  73  Resp: 17 16 (!) 21 16  Temp: 98.3 F (36.8 C) 98 F (36.7 C)  98.3 F (36.8 C)  TempSrc: Oral Oral  Oral  SpO2: 95% 96%  94%  Weight:      Height:       General exam: Awake, laying in bed, in nad Respiratory system: Normal respiratory effort, no wheezing Cardiovascular system: regular rate, s1, s2 Gastrointestinal system: Soft, nondistended, positive BS Central nervous system: CN2-12 grossly intact, strength intact Extremities: Perfused, no clubbing Skin: Normal skin turgor, no notable skin lesions seen Psychiatry: Mood normal // no visual hallucinations   Data Reviewed:  Labs reviewed: Na 129, K 4.1, Cr 1.61, BNP 295.6, WBC 10.9, Hgb 9.5, Plts 131  Family Communication: Pt in room, family not at bedside  Disposition: Status is: Observation The patient remains OBS appropriate and will d/c before 2 midnights.  Planned Discharge Destination:  Pending PT/OT eval    Author: Rickey Barbara, MD 10/04/2023 5:30 PM  For on call review [email or web address].

## 2023-10-04 NOTE — Care Management Obs Status (Signed)
 MEDICARE OBSERVATION STATUS NOTIFICATION   Patient Details  Name: Ana Franco MRN: [id number] Date of Birth: [birth year]   Medicare Observation Status Notification Given:  Yes    Ronny Bacon, RN 10/04/2023, 3:42 PM

## 2023-10-04 NOTE — Hospital Course (Signed)
 89 y.o. female with medical history significant for essential pretension, hyperlipidemia, paroxysmal atrial fibrillation chronically anticoagulated on Eliquis, anemia of chronic disease associated baseline hemoglobin range 9.5-12, who is admitted to [facility] on 10/03/2023 with acute kidney injury after presenting from SNF to [facility] ED for further evaluation management of outpatient labs that reflected interval worsening in renal function.

## 2023-10-05 DIAGNOSIS — I480 Paroxysmal atrial fibrillation: Secondary | ICD-10-CM | POA: Diagnosis present

## 2023-10-05 DIAGNOSIS — Z1612 Extended spectrum beta lactamase (ESBL) resistance: Secondary | ICD-10-CM | POA: Diagnosis present

## 2023-10-05 DIAGNOSIS — Z79899 Other long term (current) drug therapy: Secondary | ICD-10-CM | POA: Diagnosis not present

## 2023-10-05 DIAGNOSIS — Z7901 Long term (current) use of anticoagulants: Secondary | ICD-10-CM | POA: Diagnosis not present

## 2023-10-05 DIAGNOSIS — N179 Acute kidney failure, unspecified: Secondary | ICD-10-CM | POA: Diagnosis present

## 2023-10-05 DIAGNOSIS — N390 Urinary tract infection, site not specified: Secondary | ICD-10-CM | POA: Diagnosis present

## 2023-10-05 DIAGNOSIS — R110 Nausea: Secondary | ICD-10-CM | POA: Diagnosis not present

## 2023-10-05 DIAGNOSIS — I10 Essential (primary) hypertension: Secondary | ICD-10-CM | POA: Diagnosis present

## 2023-10-05 DIAGNOSIS — E860 Dehydration: Secondary | ICD-10-CM | POA: Diagnosis present

## 2023-10-05 DIAGNOSIS — Z8616 Personal history of COVID-19: Secondary | ICD-10-CM | POA: Diagnosis not present

## 2023-10-05 DIAGNOSIS — E861 Hypovolemia: Secondary | ICD-10-CM | POA: Diagnosis present

## 2023-10-05 DIAGNOSIS — E8809 Other disorders of plasma-protein metabolism, not elsewhere classified: Secondary | ICD-10-CM | POA: Diagnosis present

## 2023-10-05 DIAGNOSIS — Z66 Do not resuscitate: Secondary | ICD-10-CM | POA: Diagnosis present

## 2023-10-05 DIAGNOSIS — R768 Other specified abnormal immunological findings in serum: Secondary | ICD-10-CM | POA: Diagnosis not present

## 2023-10-05 DIAGNOSIS — Z23 Encounter for immunization: Secondary | ICD-10-CM | POA: Diagnosis not present

## 2023-10-05 DIAGNOSIS — E785 Hyperlipidemia, unspecified: Secondary | ICD-10-CM | POA: Diagnosis present

## 2023-10-05 DIAGNOSIS — K219 Gastro-esophageal reflux disease without esophagitis: Secondary | ICD-10-CM | POA: Diagnosis present

## 2023-10-05 DIAGNOSIS — F32A Depression, unspecified: Secondary | ICD-10-CM | POA: Diagnosis present

## 2023-10-05 DIAGNOSIS — E871 Hypo-osmolality and hyponatremia: Secondary | ICD-10-CM | POA: Diagnosis present

## 2023-10-05 DIAGNOSIS — A419 Sepsis, unspecified organism: Secondary | ICD-10-CM | POA: Diagnosis present

## 2023-10-05 DIAGNOSIS — D638 Anemia in other chronic diseases classified elsewhere: Secondary | ICD-10-CM | POA: Diagnosis present

## 2023-10-05 DIAGNOSIS — I252 Old myocardial infarction: Secondary | ICD-10-CM | POA: Diagnosis not present

## 2023-10-05 DIAGNOSIS — Z89512 Acquired absence of left leg below knee: Secondary | ICD-10-CM | POA: Diagnosis not present

## 2023-10-05 LAB — COMPREHENSIVE METABOLIC PANEL
ALT: 11 U/L (ref 0–44)
AST: 25 U/L (ref 15–41)
Albumin: 2.6 g/dL — ABNORMAL LOW (ref 3.5–5.0)
Alkaline Phosphatase: 59 U/L (ref 38–126)
Anion gap: 9 (ref 5–15)
BUN: 34 mg/dL — ABNORMAL HIGH (ref 8–23)
CO2: 24 mmol/L (ref 22–32)
Calcium: 8.8 mg/dL — ABNORMAL LOW (ref 8.9–10.3)
Chloride: 97 mmol/L — ABNORMAL LOW (ref 98–111)
Creatinine, Ser: 1.77 mg/dL — ABNORMAL HIGH (ref 0.44–1.00)
GFR, Estimated: 26 mL/min — ABNORMAL LOW (ref >60)
Glucose, Bld: 112 mg/dL — ABNORMAL HIGH (ref 70–99)
Potassium: 4.2 mmol/L (ref 3.5–5.1)
Sodium: 130 mmol/L — ABNORMAL LOW (ref 135–145)
Total Bilirubin: 0.7 mg/dL (ref 0.0–1.2)
Total Protein: 6.3 g/dL — ABNORMAL LOW (ref 6.5–8.1)

## 2023-10-05 LAB — CBC
HCT: 31.1 % — ABNORMAL LOW (ref 36.0–46.0)
Hemoglobin: 10 g/dL — ABNORMAL LOW (ref 12.0–15.0)
MCH: 29.3 pg (ref 26.0–34.0)
MCHC: 32.2 g/dL (ref 30.0–36.0)
MCV: 91.2 fL (ref 80.0–100.0)
Platelets: 155 10*3/uL (ref 150–400)
RBC: 3.41 MIL/uL — ABNORMAL LOW (ref 3.87–5.11)
RDW: 13.6 % (ref 11.5–15.5)
WBC: 10.3 10*3/uL (ref 4.0–10.5)
nRBC: 0 % (ref 0.0–0.2)

## 2023-10-05 MED ORDER — INFLUENZA VAC A&B SURF ANT ADJ 0.5 ML IM SUSY
0.5000 mL | PREFILLED_SYRINGE | INTRAMUSCULAR | Status: AC | PRN
  Administered 2023-10-06: 0.5 mL via INTRAMUSCULAR
  Filled 2023-10-05: qty 0.5

## 2023-10-05 MED ORDER — SODIUM CHLORIDE 0.9 % IV SOLN: INTRAVENOUS | Status: AC

## 2023-10-05 NOTE — Progress Notes (Signed)
  Progress Note   Patient: Ana Franco AVW:[id number] DOB: 1925-12-06 DOA: 10/03/2023     0 DOS: the patient was seen and examined on 10/05/2023   Brief hospital course: 89 y.o. female with medical history significant for essential pretension, hyperlipidemia, paroxysmal atrial fibrillation chronically anticoagulated on Eliquis, anemia of chronic disease associated baseline hemoglobin range 9.5-12, who is admitted to [facility] on 10/03/2023 with acute kidney injury after presenting from SNF to [facility] ED for further evaluation management of outpatient labs that reflected interval worsening in renal function.   Assessment and Plan: #) Acute Kidney Injury:  -Relative to her baseline creatinine range 1.1-1.3 -Presenting Cr of 1.92, down to 1.61 with trial of gentle IVF -holding home lasix  -still appears somewhat dehydrated. Cr up to 1.77 -Will resume NS at 75cc/hr -recheck bmet in AM   #) Hyponatremia:  -Presenting sodium level improved to 130 -Urine Na is elevated, urine osm and serum osm are both borderline low. Consistent with possible SIADH in the setting of recent COVID -Given concerns of underlying dehydration with ARF, will liberalize fluid restriction -Recheck Na in AM   #) Recent COVID-19 diagnosis:  -The patient reports that she was recently diagnosed with COVID-19 infection 2 to 3 weeks ago, but has recovered from the mild symptoms  -Continue with supportive care   #) Sepsis with UTI present on admit:  -Pt presented with white blood cell count of 12,900, along with tachypnea.   -UA is suggestive of UTI with >50 WBC and large leuk with many bacteria -Urine cx thus far pos for >100,000 ecoli species -Cont on empiric rocephin -recheck cbc in AM   #) Essential Hypertension:  -continue outpatient amlodipine and metoprolol titrate.   -lasix on hold for now per above -Bp stable   #) Hyperlipidemia:  -continue home ezetimibe and simvastatin .     #) Paroxysmal atrial  fibrillation:  -CHA2DS2-VASc score of  4 -continued on Eliquis -continue metoprolol as scheduled -remains rate controlled   #) Anemia of chronic disease:  -Hemodynamically stable - Repeat CBC in the morning.    #) Depression:  -Celexa for now, as above.  -TSH 0.425    Subjective: Does report feeling somewhat dehydrated this AM  Physical Exam: Vitals:   10/04/23 2121 10/05/23 0500 10/05/23 0511 10/05/23 1026  BP: (!) 145/66  (!) 132/52 106/75  Pulse: 90  63 77  Resp: 18  16 17   Temp: 99.5 F (37.5 C)  98 F (36.7 C) 98.4 F (36.9 C)  TempSrc: Oral  Oral Oral  SpO2: 96%  97% 96%  Weight:  80.2 kg    Height:       General exam: Conversant, in no acute distress Respiratory system: normal chest rise, clear, no audible wheezing Cardiovascular system: regular rhythm, s1-s2 Gastrointestinal system: Nondistended, nontender, pos BS Central nervous system: No seizures, no tremors Extremities: No cyanosis, no joint deformities Skin: No rashes, no pallor Psychiatry: Affect normal // no auditory hallucinations   Data Reviewed:  Labs reviewed: Na 130, K 4.2, Cr 1.77, WBC 10.3, Hgb 10.0  Family Communication: Pt in room, family not at bedside  Disposition: Status is: Observation The patient remains OBS appropriate and will d/c before 2 midnights.  Planned Discharge Destination: Skilled nursing facility    Author: Rickey Barbara, MD 10/05/2023 3:45 PM  For on call review [email or web address].

## 2023-10-05 NOTE — Evaluation (Signed)
 Occupational Therapy Evaluation Patient Details Name: Ana Franco MRN: [id number] DOB: 04-Jul-1926 Today's Date: 10/05/2023   History of Present Illness   89 y.o. female presents to [facility] from SNF on 10/03/23 for further evaluation of worsening renal function. Admitted w/ AKI, hyponatremia, and sepsis w/ UTI. PMH includes L BKA, afib on Eliquis, HTN, CAD, TIA/CVA with R weakness, HLD, MI, CVI.     Clinical Impressions Pt has assist at baseline for ADLs, reports ambulating short distances with RW LLE prosthesis, uses w/c otherwise. Pt currently needing up to mod A for ADLs, CGA for bed mobility and up to mod A for transfers/short distance ambulation with RW, cues for safety/pacing. Pt with RUE weakness from prior CVA, but functional for BADLs during session. Pt presenting with impairments listed below, will follow acutely. Patient will benefit from continued inpatient follow up therapy, <3 hours/day to maximize safety/ind with ADL/functional mobility.      If plan is discharge home, recommend the following:   A little help with walking and/or transfers;A lot of help with bathing/dressing/bathroom;Assistance with cooking/housework;Direct supervision/assist for financial management;Direct supervision/assist for medications management;Assist for transportation;Help with stairs or ramp for entrance     Functional Status Assessment   Patient has had a recent decline in their functional status and demonstrates the ability to make significant improvements in function in a reasonable and predictable amount of time.     Equipment Recommendations   Other (comment) (defer)     Recommendations for Other Services   PT consult     Precautions/Restrictions   Precautions Precautions: Fall Precaution/Restrictions Comments: L BKA, prosthetic in room Restrictions Weight Bearing Restrictions Per Provider Order: No     Mobility Bed Mobility Overal bed mobility: Needs Assistance Bed  Mobility: Supine to Sit, Sit to Supine     Supine to sit: Contact guard Sit to supine: Contact guard assist   General bed mobility comments: CGA for safety    Transfers Overall transfer level: Needs assistance Equipment used: Rolling walker (2 wheels) Transfers: Sit to/from Stand Sit to Stand: Mod assist                  Balance Overall balance assessment: Needs assistance, Mild deficits observed, not formally tested Sitting-balance support: No upper extremity supported, Feet supported Sitting balance-Leahy Scale: Fair     Standing balance support: Bilateral upper extremity supported, During functional activity, Reliant on assistive device for balance Standing balance-Leahy Scale: Poor Standing balance comment: reliant on RW                           ADL either performed or assessed with clinical judgement   ADL Overall ADL's : Needs assistance/impaired Eating/Feeding: Set up   Grooming: Oral care;Sitting;Minimal assistance   Upper Body Bathing: Moderate assistance   Lower Body Bathing: Moderate assistance   Upper Body Dressing : Moderate assistance   Lower Body Dressing: Moderate assistance Lower Body Dressing Details (indicate cue type and reason): assist for R shoe/sock Toilet Transfer: Moderate assistance;Rolling walker (2 wheels)   Toileting- Clothing Manipulation and Hygiene: Moderate assistance       Functional mobility during ADLs: Contact guard assist;Rolling walker (2 wheels)       Vision Baseline Vision/History: 4 Cataracts Additional Comments: pt reports vision is baseline but feels her cataracts may be coming back     Perception Perception: Within Functional Limits       Praxis Praxis: [facility]       Pertinent  Vitals/Pain Pain Assessment Pain Assessment: No/denies pain     Extremity/Trunk Assessment Upper Extremity Assessment Upper Extremity Assessment: Generalized weakness;RUE deficits/detail RUE Deficits / Details:  weakness at shoulder, pt reports it is weak from a CVA 5 weeks ago, however per chart review CVA was ~2 years ago. RUE functional for use of RW and performing grooming tasks at sink RUE Coordination: decreased gross motor;decreased fine motor   Lower Extremity Assessment Lower Extremity Assessment: Defer to PT evaluation LLE Deficits / Details: L BKA   Cervical / Trunk Assessment Cervical / Trunk Assessment: Kyphotic   Communication Communication Communication: No apparent difficulties   Cognition Arousal: Alert Behavior During Therapy: WFL for tasks assessed/performed Cognition: No family/caregiver present to determine baseline, Cognition impaired   Orientation impairments: Situation         OT - Cognition Comments: reports R arm is week from a "stroke 5 weeks ago", per chart review pt's stroke was ~2 years ago                 Following commands: Intact       Cueing  General Comments   Cueing Techniques: Verbal cues;Tactile cues      Exercises     Shoulder Instructions      Home Living Family/patient expects to be discharged to:: Skilled nursing facility                                 Additional Comments: staff assists with ADLs/medications/meals, son provides transportation      Prior Functioning/Environment Prior Level of Function : Needs assist             Mobility Comments: ambulating short distances to/from bathroom with RW and LLE prosthetic ADLs Comments: does not go to dining hall, typically eats in room (meals provided). staff assists with bathing on shower seat 2x/wk; pt reports mod indep with toileting    OT Problem List: Decreased strength;Decreased range of motion;Decreased activity tolerance;Impaired balance (sitting and/or standing);Decreased safety awareness;Decreased cognition;Impaired UE functional use   OT Treatment/Interventions: Self-care/ADL training;Therapeutic exercise;Energy conservation;DME and/or AE  instruction;Therapeutic activities;Patient/family education;Balance training      OT Goals(Current goals can be found in the care plan section)   Acute Rehab OT Goals Patient Stated Goal: none stated OT Goal Formulation: With patient Time For Goal Achievement: 10/19/23 Potential to Achieve Goals: Good ADL Goals Pt Will Perform Upper Body Dressing: with supervision;sitting Pt Will Perform Lower Body Dressing: with supervision;sit to/from stand;sitting/lateral leans Pt Will Transfer to Toilet: with supervision;ambulating;regular height toilet Additional ADL Goal #1: pt will perform standing functional task x5 min in prep for standing ADLs   OT Frequency:  Min 1X/week    Co-evaluation PT/OT/SLP Co-Evaluation/Treatment: Yes Reason for Co-Treatment: For patient/therapist safety;To address functional/ADL transfers PT goals addressed during session: Mobility/safety with mobility;Balance;Proper use of DME OT goals addressed during session: ADL's and self-care      AM-PAC OT "6 Clicks" Daily Activity     Outcome Measure Help from another person eating meals?: A Little Help from another person taking care of personal grooming?: A Little Help from another person toileting, which includes using toliet, bedpan, or urinal?: A Lot Help from another person bathing (including washing, rinsing, drying)?: A Lot Help from another person to put on and taking off regular upper body clothing?: A Little Help from another person to put on and taking off regular lower body clothing?: A Lot 6 Click Score:  15   End of Session Equipment Utilized During Treatment: Gait belt;Rolling walker (2 wheels) Nurse Communication: Mobility status  Activity Tolerance: Patient tolerated treatment well Patient left: in bed;with call bell/phone within reach;with bed alarm set  OT Visit Diagnosis: Unsteadiness on feet (R26.81);Other abnormalities of gait and mobility (R26.89);Muscle weakness (generalized) (M62.81)                 Time: 1478-2956 OT Time Calculation (min): 43 min Charges:  OT General Charges $OT Visit: 1 Visit OT Evaluation $OT Eval Moderate Complexity: 1 Mod OT Treatments $Self Care/Home Management : 8-22 mins  Carver Fila, OTD, OTR/L SecureChat Preferred Acute Rehab [phone]   Dalphine Handing 10/05/2023, 4:46 PM

## 2023-10-05 NOTE — Evaluation (Signed)
 Physical Therapy Evaluation Patient Details Name: Ana Franco MRN: [id number] DOB: 04-Mar-1926 Today's Date: 10/05/2023  History of Present Illness  89 y.o. female presents to [facility] from SNF on 10/03/23 for further evaluation of worsening renal function. Admitted w/ AKI, hyponatremia, and sepsis w/ UTI. PMH includes L BKA, afib on Eliquis, HTN, CAD, TIA/CVA, HLD, MI, CVI.   Clinical Impression  Pt in bed upon arrival and agreeable to PT eval. PTA, pt needed assistance with transfers and gait with RW and L LE prosthetic. In today's session, pt was able to don/doff prosthetic device with assist for set-up. Pt required ModA to stand with RW and MinA to ambulate ~30 ft. Pt was occasionally unsteady and had difficulty managing RW. Pt currently with functional limitations due to the deficits listed below (see PT Problem List). Pt would benefit from acute skilled PT to address functional impairments. Recommending post-acute to work towards independence with mobility. Acute PT to follow.         If plan is discharge home, recommend the following: A lot of help with walking and/or transfers;A lot of help with bathing/dressing/bathroom;Assist for transportation;Help with stairs or ramp for entrance   Can travel by private vehicle   No    Equipment Recommendations None recommended by PT     Functional Status Assessment Patient has had a recent decline in their functional status and demonstrates the ability to make significant improvements in function in a reasonable and predictable amount of time.     Precautions / Restrictions Precautions Precautions: Fall Precaution/Restrictions Comments: L BKA, prosthetic in room      Mobility  Bed Mobility Overal bed mobility: Needs Assistance Bed Mobility: Supine to Sit, Sit to Supine    Supine to sit: Contact guard Sit to supine: Contact guard assist   General bed mobility comments: CGA for safety    Transfers Overall transfer level: Needs  assistance Equipment used: Rolling walker (2 wheels) Transfers: Sit to/from Stand Sit to Stand: Mod assist    General transfer comment: ModA for boost up and steadying, cues for hand placement. Pt has difficulty eccentric lowering    Ambulation/Gait Ambulation/Gait assistance: Min assist Gait Distance (Feet): 30 Feet Assistive device: Rolling walker (2 wheels) Gait Pattern/deviations: Step-through pattern, Shuffle, Staggering left, Staggering right Gait velocity: decr     General Gait Details: slightly unsteady with staggering left/right. MinA for balance and slight RW management     Balance Overall balance assessment: Needs assistance, Mild deficits observed, not formally tested Sitting-balance support: No upper extremity supported, Feet supported Sitting balance-Leahy Scale: Fair     Standing balance support: Bilateral upper extremity supported, During functional activity, Reliant on assistive device for balance Standing balance-Leahy Scale: Poor Standing balance comment: reliant on RW         Pertinent Vitals/Pain Pain Assessment Pain Assessment: No/denies pain    Home Living Family/patient expects to be discharged to:: Skilled nursing facility     Additional Comments: staff assists with ADLs/medications/meals, son provides transportation    Prior Function Prior Level of Function : (P) Needs assist    Mobility Comments: ambulating short distances to/from bathroom with RW and LLE prosthetic ADLs Comments: does not go to dining hall, typically eats in room (meals provided). staff assists with bathing on shower seat 2x/wk; pt reports mod indep with toileting     Extremity/Trunk Assessment   Upper Extremity Assessment Upper Extremity Assessment: Defer to OT evaluation    Lower Extremity Assessment Lower Extremity Assessment: LLE deficits/detail;Generalized weakness LLE Deficits /  Details: L BKA    Cervical / Trunk Assessment Cervical / Trunk Assessment:  Kyphotic  Communication   Communication Communication: No apparent difficulties    Cognition Arousal: Alert Behavior During Therapy: WFL for tasks assessed/performed   PT - Cognitive impairments: No family/caregiver present to determine baseline, Sequencing, Problem solving, Safety/Judgement  PT - Cognition Comments: Has difficulty following commands for questions, often needs rephrasing for comprehension. Cues for sequencing Following commands: Intact       Cueing Cueing Techniques: Verbal cues, Tactile cues     General Comments General comments (skin integrity, edema, etc.): VSS on RA, reddish discoloration on R shin with overgrown toenails.           PT Assessment Patient needs continued PT services  PT Problem List Decreased strength;Decreased range of motion;Decreased activity tolerance;Decreased balance;Decreased mobility;Decreased coordination;Decreased knowledge of use of DME;Decreased safety awareness       PT Treatment Interventions DME instruction;Gait training;Functional mobility training;Stair training;Therapeutic exercise;Therapeutic activities;Balance training;Neuromuscular re-education;Patient/family education    PT Goals (Current goals can be found in the Care Plan section)  Acute Rehab PT Goals Patient Stated Goal: to get better PT Goal Formulation: With patient Time For Goal Achievement: 10/19/23 Potential to Achieve Goals: Good    Frequency Min 1X/week     Co-evaluation PT/OT/SLP Co-Evaluation/Treatment: Yes Reason for Co-Treatment: For patient/therapist safety;To address functional/ADL transfers PT goals addressed during session: Mobility/safety with mobility;Balance;Proper use of DME         AM-PAC PT "6 Clicks" Mobility  Outcome Measure Help needed turning from your back to your side while in a flat bed without using bedrails?: A Little Help needed moving from lying on your back to sitting on the side of a flat bed without using bedrails?: A  Little Help needed moving to and from a bed to a chair (including a wheelchair)?: A Lot Help needed standing up from a chair using your arms (e.g., wheelchair or bedside chair)?: A Lot Help needed to walk in hospital room?: A Little Help needed climbing 3-5 steps with a railing? : Total 6 Click Score: 14    End of Session Equipment Utilized During Treatment: Gait belt Activity Tolerance: Patient tolerated treatment well Patient left: in bed;with call bell/phone within reach;with bed alarm set Nurse Communication: Mobility status PT Visit Diagnosis: Unsteadiness on feet (R26.81);Muscle weakness (generalized) (M62.81)    Time: 5284-1324 PT Time Calculation (min) (ACUTE ONLY): 49 min   Charges:   PT Evaluation $PT Eval Low Complexity: 1 Low   PT General Charges $$ ACUTE PT VISIT: 1 Visit       Hilton Cork, PT, DPT Secure Chat Preferred  Rehab Office [phone]   Arturo Morton Brion Aliment 10/05/2023, 3:44 PM

## 2023-10-06 DIAGNOSIS — R768 Other specified abnormal immunological findings in serum: Secondary | ICD-10-CM | POA: Diagnosis not present

## 2023-10-06 DIAGNOSIS — E860 Dehydration: Secondary | ICD-10-CM | POA: Diagnosis not present

## 2023-10-06 DIAGNOSIS — N179 Acute kidney failure, unspecified: Secondary | ICD-10-CM | POA: Diagnosis not present

## 2023-10-06 LAB — COMPREHENSIVE METABOLIC PANEL
ALT: 11 U/L (ref 0–44)
AST: 26 U/L (ref 15–41)
Albumin: 2.4 g/dL — ABNORMAL LOW (ref 3.5–5.0)
Alkaline Phosphatase: 50 U/L (ref 38–126)
Anion gap: 7 (ref 5–15)
BUN: 29 mg/dL — ABNORMAL HIGH (ref 8–23)
CO2: 23 mmol/L (ref 22–32)
Calcium: 8.4 mg/dL — ABNORMAL LOW (ref 8.9–10.3)
Chloride: 100 mmol/L (ref 98–111)
Creatinine, Ser: 1.64 mg/dL — ABNORMAL HIGH (ref 0.44–1.00)
GFR, Estimated: 29 mL/min — ABNORMAL LOW (ref >60)
Glucose, Bld: 101 mg/dL — ABNORMAL HIGH (ref 70–99)
Potassium: 4 mmol/L (ref 3.5–5.1)
Sodium: 130 mmol/L — ABNORMAL LOW (ref 135–145)
Total Bilirubin: 0.6 mg/dL (ref 0.0–1.2)
Total Protein: 5.7 g/dL — ABNORMAL LOW (ref 6.5–8.1)

## 2023-10-06 LAB — URINE CULTURE: Culture: >=100000 — AB

## 2023-10-06 LAB — CBC
HCT: 29.5 % — ABNORMAL LOW (ref 36.0–46.0)
Hemoglobin: 9.6 g/dL — ABNORMAL LOW (ref 12.0–15.0)
MCH: 29.8 pg (ref 26.0–34.0)
MCHC: 32.5 g/dL (ref 30.0–36.0)
MCV: 91.6 fL (ref 80.0–100.0)
Platelets: 187 10*3/uL (ref 150–400)
RBC: 3.22 MIL/uL — ABNORMAL LOW (ref 3.87–5.11)
RDW: 13.6 % (ref 11.5–15.5)
WBC: 10 10*3/uL (ref 4.0–10.5)
nRBC: 0 % (ref 0.0–0.2)

## 2023-10-06 MED ORDER — FOSFOMYCIN TROMETHAMINE 3 G PO PACK
3.0000 g | PACK | Freq: Once | ORAL | Status: AC
  Administered 2023-10-06: 3 g via ORAL
  Filled 2023-10-06: qty 3

## 2023-10-06 MED ORDER — SODIUM CHLORIDE 0.9 % IV SOLN: INTRAVENOUS | Status: AC

## 2023-10-06 NOTE — TOC Initial Note (Signed)
 Transition of Care [facility]) - Initial/Assessment Note    Patient Details  Name: Ana Franco MRN: [id number] Date of Birth: 15-Dec-1925  Transition of Care [facility]) CM/SW Contact:    Carley Hammed, LCSW Phone Number: 10/06/2023, 12:50 PM  Clinical Narrative:                 CSW spoke with [facility] who noted pt is from ALF, after reviewing they determined that pt can return to ALF level and receive rehab from there. Facility notes pt will not need an auth and will need an ALF FL2 at DC. CSW spoke with pt's son who is in agreement. He requests PTAR transport for her at DC. CSW notified medical team and will continue to follow for DC needs.   Expected Discharge Plan: Assisted Living Barriers to Discharge: Continued Medical Work up   Patient Goals and CMS Choice     Choice offered to / list presented to : Adult Children      Expected Discharge Plan and Services     Post Acute Care Choice: Resumption of Svcs/PTA Provider Living arrangements for the past 2 months: Assisted Living Facility                                      Prior Living Arrangements/Services Living arrangements for the past 2 months: Assisted Living Facility Lives with:: Facility Resident Patient language and need for interpreter reviewed:: Yes Do you feel safe going back to the place where you live?: Yes      Need for Family Participation in Patient Care: Yes (Comment) Care giver support system in place?: Yes (comment)   Criminal Activity/Legal Involvement Pertinent to Current Situation/Hospitalization: No - Comment as needed  Activities of Daily Living   ADL Screening (condition at time of admission) Independently performs ADLs?: No Does the patient have a NEW difficulty with bathing/dressing/toileting/self-feeding that is expected to last >3 days?: No Does the patient have a NEW difficulty with getting in/out of bed, walking, or climbing stairs that is expected to last >3 days?: No Does the  patient have a NEW difficulty with communication that is expected to last >3 days?: No Is the patient deaf or have difficulty hearing?: No Does the patient have difficulty seeing, even when wearing glasses/contacts?: No Does the patient have difficulty concentrating, remembering, or making decisions?: No  Permission Sought/Granted Permission sought to share information with : Family Supports Permission granted to share information with : Yes, Verbal Permission Granted  Share Information with NAME: Brett Canales     Permission granted to share info w Relationship: Son     Emotional Assessment Appearance:: Appears stated age     Orientation: : Oriented to Self, Oriented to Place, Oriented to  Time Alcohol / Substance Use: Not Applicable Psych Involvement: No (comment)  Admission diagnosis:  Dehydration [E86.0] AKI (acute kidney injury) (HCC) [N17.9] COVID-19 virus antibody detected [R76.8] ARF (acute renal failure) (HCC) [N17.9] Patient Active Problem List   Diagnosis Date Noted   ARF (acute renal failure) (HCC) 10/05/2023   SIRS (systemic inflammatory response syndrome) (HCC) 10/04/2023   Anemia of chronic disease 10/04/2023   COVID-19 virus antibody detected 10/04/2023   AKI (acute kidney injury) (HCC) 10/03/2023   Hypokalemia 12/15/2022   Acute diastolic CHF (congestive heart failure) (HCC) 12/14/2022   Acute hyponatremia 12/14/2022   Normocytic anemia 12/14/2022   Coronary artery disease 12/14/2022   Hx of BKA,  left (HCC) 12/14/2022   Permanent atrial fibrillation (HCC) 12/18/2021   H/O: CVA (cerebrovascular accident) 12/18/2021   History of non-ST elevation myocardial infarction (NSTEMI) 12/18/2021   Acute CVA (cerebrovascular accident) (HCC) 01/07/2021   Thrombocytopenia (HCC) 01/06/2021   TIA (transient ischemic attack) 01/06/2021   DNR (do not resuscitate) 01/06/2021   Chronic kidney disease CKD IIIb 01/06/2021   Paroxysmal atrial fibrillation (HCC) 05/04/2018   Gangrene  of lower extremity (HCC)    Subacute osteomyelitis, left ankle and foot (HCC)    S/P ORIF (open reduction internal fixation) fracture 04/03/2018   Wound infection 04/02/2018   MRSA (methicillin resistant staph aureus) culture positive 04/02/2018   Acute blood loss anemia 04/02/2018   Closed left ankle fracture, with delayed healing, subsequent encounter 03/31/2018   Essential hypertension 06/23/2014   HLD (hyperlipidemia) 06/23/2014   GERD (gastroesophageal reflux disease) 06/23/2014   UTI (urinary tract infection) 06/23/2014   Depression 06/23/2014   PCP:  Sherron Monday, MD Pharmacy:   [facility] [address] [place] [address] Phone: [phone] Fax: [phone]     Social Drivers of Health (SDOH) Social History: SDOH Screenings   Food Insecurity: No Food Insecurity (10/04/2023)  Housing: Low Risk  (10/04/2023)  Transportation Needs: No Transportation Needs (10/04/2023)  Utilities: Not At Risk (10/04/2023)  Social Connections: Moderately Isolated (10/05/2023)  Tobacco Use: Low Risk  (10/04/2023)   SDOH Interventions:     Readmission Risk Interventions     No data to display

## 2023-10-06 NOTE — Plan of Care (Signed)

## 2023-10-06 NOTE — Progress Notes (Signed)
  Progress Note   Patient: Ana Franco ZOX:[id number] DOB: August 08, 1926 DOA: 10/03/2023     1 DOS: the patient was seen and examined on 10/06/2023   Brief hospital course: 89 y.o. female with medical history significant for essential pretension, hyperlipidemia, paroxysmal atrial fibrillation chronically anticoagulated on Eliquis, anemia of chronic disease associated baseline hemoglobin range 9.5-12, who is admitted to [facility] on 10/03/2023 with acute kidney injury after presenting from SNF to [facility] ED for further evaluation management of outpatient labs that reflected interval worsening in renal function.   Assessment and Plan: #) Acute Kidney Injury:  -Relative to her baseline creatinine range 1.1-1.3 -Presenting Cr of 1.92, down to 1.64 with trial of gentle IVF -holding home lasix  -Continued on gentle NS at 75cc/hr -recheck bmet in AM   #) Hyponatremia:  -Presenting sodium level improved to 130 -Urine Na is elevated, urine osm and serum osm are both borderline low. Consistent with possible SIADH in the setting of recent COVID -Given concerns of underlying dehydration with ARF, liberalized fluid restriction -Sodium remains stable   #) Recent COVID-19 diagnosis:  -The patient reports that she was recently diagnosed with COVID-19 infection 2 to 3 weeks ago, but has recovered from the mild symptoms  -Continue with supportive care   #) Sepsis with esbl UTI present on admit:  -Pt presented with white blood cell count of 12,900, along with tachypnea.   -UA is suggestive of UTI with >50 WBC and large leuk with many bacteria -Urine cx thus far pos for >100,000 ESBL ecoli species -Was on empiric rocephin. Discussed with pharmacy, plan to give fosfomycin -recheck cbc in AM   #) Essential Hypertension:  -continue outpatient amlodipine and metoprolol titrate.   -lasix remains on hold for now per above -Bp stable   #) Hyperlipidemia:  -continue home ezetimibe and simvastatin .      #) Paroxysmal atrial fibrillation:  -CHA2DS2-VASc score of  4 -continued on Eliquis -continue metoprolol as scheduled -remains rate controlled   #) Anemia of chronic disease:  -Hemodynamically stable - Repeat CBC in the morning.    #) Depression:  -Celexa for now, as above.  -TSH 0.425    Subjective: Feeling more weak this AM.   Physical Exam: Vitals:   10/05/23 2038 10/06/23 0305 10/06/23 0841 10/06/23 1636  BP: (!) 168/68 130/60 (!) 154/64 (!) 167/65  Pulse: 89 66 69 73  Resp: 14 (!) 21 17   Temp: 98.4 F (36.9 C) 97.7 F (36.5 C) 98.9 F (37.2 C) 99.9 F (37.7 C)  TempSrc: Oral Axillary Oral Oral  SpO2: 97% 97% 96% 96%  Weight:      Height:       General exam: Awake, laying in bed, in nad Respiratory system: Normal respiratory effort, no wheezing Cardiovascular system: regular rate, s1, s2 Gastrointestinal system: Soft, nondistended, positive BS Central nervous system: CN2-12 grossly intact, strength intact Extremities: Perfused, no clubbing Skin: Normal skin turgor, no notable skin lesions seen Psychiatry: Mood normal // no visual hallucinations   Data Reviewed:  Labs reviewed: Na 130, K 4.0, Cr 1.64, WBC 10.0, Hgb 9.6  Family Communication: Pt in room, family not at bedside  Disposition: Status is: Observation The patient remains OBS appropriate and will d/c before 2 midnights.  Planned Discharge Destination: Skilled nursing facility    Author: Rickey Barbara, MD 10/06/2023 6:07 PM  For on call review [email or web address].

## 2023-10-06 NOTE — Progress Notes (Signed)
 Mobility Specialist Progress Note:    10/06/23 1228  Mobility  Activity Dangled on edge of bed  Level of Assistance Minimal assist, patient does 75% or more  Assistive Device Other (Comment) (HHA)  Activity Response Tolerated well  Mobility Referral Yes  Mobility visit 1 Mobility  Mobility Specialist Start Time (ACUTE ONLY) 1040  Mobility Specialist Stop Time (ACUTE ONLY) 1058  Mobility Specialist Time Calculation (min) (ACUTE ONLY) 18 min   Pt received in bed agreeable to mobility. Pt c/o mild nausea declined ambulation but agreeable to dangle at EOB and perform LE exercises. No c/o pain during exercises. Returned to supine in bed, w/ call bell and personal belongings in reach. All needs met. Bed alarm on.  Thompson Grayer Mobility Specialist  Please contact vis Secure Chat or  Rehab Office [phone]

## 2023-10-07 DIAGNOSIS — E860 Dehydration: Secondary | ICD-10-CM | POA: Diagnosis not present

## 2023-10-07 DIAGNOSIS — R768 Other specified abnormal immunological findings in serum: Secondary | ICD-10-CM | POA: Diagnosis not present

## 2023-10-07 DIAGNOSIS — N179 Acute kidney failure, unspecified: Secondary | ICD-10-CM | POA: Diagnosis not present

## 2023-10-07 LAB — COMPREHENSIVE METABOLIC PANEL
ALT: 12 U/L (ref 0–44)
AST: 24 U/L (ref 15–41)
Albumin: 2.3 g/dL — ABNORMAL LOW (ref 3.5–5.0)
Alkaline Phosphatase: 46 U/L (ref 38–126)
Anion gap: 5 (ref 5–15)
BUN: 24 mg/dL — ABNORMAL HIGH (ref 8–23)
CO2: 20 mmol/L — ABNORMAL LOW (ref 22–32)
Calcium: 7.8 mg/dL — ABNORMAL LOW (ref 8.9–10.3)
Chloride: 106 mmol/L (ref 98–111)
Creatinine, Ser: 1.45 mg/dL — ABNORMAL HIGH (ref 0.44–1.00)
GFR, Estimated: 33 mL/min — ABNORMAL LOW (ref >60)
Glucose, Bld: 102 mg/dL — ABNORMAL HIGH (ref 70–99)
Potassium: 3.6 mmol/L (ref 3.5–5.1)
Sodium: 131 mmol/L — ABNORMAL LOW (ref 135–145)
Total Bilirubin: 0.5 mg/dL (ref 0.0–1.2)
Total Protein: 5.3 g/dL — ABNORMAL LOW (ref 6.5–8.1)

## 2023-10-07 LAB — CBC
HCT: 29.7 % — ABNORMAL LOW (ref 36.0–46.0)
Hemoglobin: 9.6 g/dL — ABNORMAL LOW (ref 12.0–15.0)
MCH: 29.2 pg (ref 26.0–34.0)
MCHC: 32.3 g/dL (ref 30.0–36.0)
MCV: 90.3 fL (ref 80.0–100.0)
Platelets: 169 10*3/uL (ref 150–400)
RBC: 3.29 MIL/uL — ABNORMAL LOW (ref 3.87–5.11)
RDW: 13.5 % (ref 11.5–15.5)
WBC: 8.4 10*3/uL (ref 4.0–10.5)
nRBC: 0 % (ref 0.0–0.2)

## 2023-10-07 NOTE — Progress Notes (Signed)
 Physical Therapy Treatment Patient Details Name: Ana Franco MRN: [id number] DOB: 03/04/1926 Today's Date: 10/07/2023   History of Present Illness 89 y.o. female presents to [facility] from SNF on 10/03/23 for further evaluation of worsening renal function. Admitted w/ AKI, hyponatremia, and sepsis w/ UTI. PMH includes L BKA, afib on Eliquis, HTN, CAD, TIA/CVA, HLD, MI, CVI.    PT Comments  Pt reports she doesn't think that she can walk with therapy today but does want to ge up. Pt requires total A for donning prosthetic and shoe for OOB. Pt min A for bed mobility, min A for sit<>stand. Pt marches in place x20 and then reports needing to have a BM. Pt modA for stepping transfer to [facility] and back to seated EoB. Total A for pericare. D/c plan remains appropriate. PT will continue to follow acutely.    If plan is discharge home, recommend the following: A lot of help with walking and/or transfers;A lot of help with bathing/dressing/bathroom;Assist for transportation;Help with stairs or ramp for entrance   Can travel by private vehicle     No  Equipment Recommendations  None recommended by PT       Precautions / Restrictions Precautions Precautions: Fall Precaution/Restrictions Comments: L BKA, prosthetic in room Restrictions Weight Bearing Restrictions Per Provider Order: No     Mobility  Bed Mobility Overal bed mobility: Needs Assistance Bed Mobility: Supine to Sit, Sit to Supine     Supine to sit: Contact guard Sit to supine: Contact guard assist   General bed mobility comments: CGA for safety    Transfers Overall transfer level: Needs assistance Equipment used: Rolling walker (2 wheels) Transfers: Sit to/from Stand, Bed to chair/wheelchair/BSC Sit to Stand: Mod assist   Step pivot transfers: Mod assist       General transfer comment: ModA for boost up and steadying, cues for hand placement. Pt has difficulty eccentric lowering      Balance Overall balance assessment:  Needs assistance, Mild deficits observed, not formally tested Sitting-balance support: No upper extremity supported, Feet supported Sitting balance-Leahy Scale: Fair     Standing balance support: Bilateral upper extremity supported, During functional activity, Reliant on assistive device for balance Standing balance-Leahy Scale: Poor Standing balance comment: reliant on RW                            Communication Communication Communication: No apparent difficulties  Cognition Arousal: Alert Behavior During Therapy: WFL for tasks assessed/performed   PT - Cognitive impairments: No family/caregiver present to determine baseline, Sequencing, Problem solving, Safety/Judgement                       PT - Cognition Comments: cues for sequencing Following commands: Intact      Cueing Cueing Techniques: Verbal cues, Tactile cues  Exercises General Exercises - Lower Extremity Hip Flexion/Marching: AROM, Both, 15 reps, Standing    General Comments General comments (skin integrity, edema, etc.): VSS on RA , total A for donning prosthetic and shoes      Pertinent Vitals/Pain Pain Assessment Pain Assessment: No/denies pain     PT Goals (current goals can now be found in the care plan section) Acute Rehab PT Goals Patient Stated Goal: to get better PT Goal Formulation: With patient Time For Goal Achievement: 10/19/23 Potential to Achieve Goals: Good Progress towards PT goals: Progressing toward goals    Frequency    Min 1X/week  AM-PAC PT "6 Clicks" Mobility   Outcome Measure  Help needed turning from your back to your side while in a flat bed without using bedrails?: A Little Help needed moving from lying on your back to sitting on the side of a flat bed without using bedrails?: A Little Help needed moving to and from a bed to a chair (including a wheelchair)?: A Lot Help needed standing up from a chair using your arms (e.g., wheelchair or  bedside chair)?: A Lot Help needed to walk in hospital room?: A Little Help needed climbing 3-5 steps with a railing? : Total 6 Click Score: 14    End of Session Equipment Utilized During Treatment: Gait belt Activity Tolerance: Patient tolerated treatment well Patient left: in bed;with call bell/phone within reach;with bed alarm set Nurse Communication: Mobility status PT Visit Diagnosis: Unsteadiness on feet (R26.81);Muscle weakness (generalized) (M62.81)     Time: 1610-9604 PT Time Calculation (min) (ACUTE ONLY): 56 min  Charges:    $Gait Training: 8-22 mins $Therapeutic Activity: 8-22 mins PT General Charges $$ ACUTE PT VISIT: 1 Visit                     Delando Satter B. Beverely Risen PT, DPT Acute Rehabilitation Services Please use secure chat or  Call Office [phone]    Elon Alas [facility] 10/07/2023, 3:03 PM

## 2023-10-07 NOTE — Progress Notes (Signed)
 Progress Note   Patient: Ana Franco ZOX:[id number] DOB: 1926-05-04 DOA: 10/03/2023     2 DOS: the patient was seen and examined on 10/07/2023   Brief hospital course: 89 y.o. female with medical history significant for essential pretension, hyperlipidemia, paroxysmal atrial fibrillation chronically anticoagulated on Eliquis, anemia of chronic disease associated baseline hemoglobin range 9.5-12, who is admitted to [facility] on 10/03/2023 with acute kidney injury after presenting from SNF to [facility] ED for further evaluation management of outpatient labs that reflected interval worsening in renal function.   Assessment and Plan: #) Acute Kidney Injury:  -Relative to her baseline creatinine range 1.1-1.3 -Presenting Cr of 1.92 -holding home lasix  -Continued on gentle NS at 75cc/hr. Cr improving , down to 1.42 -This AM reported nausea with complaints of dry lips. Will cont gentle hydration for now -recheck bmet in AM   #) Hyponatremia:  -Presenting sodium level improved to 130 -Urine Na is elevated, urine osm and serum osm are both borderline low. Consistent with possible SIADH in the setting of recent COVID -Given concerns of underlying dehydration with ARF, liberalized fluid restriction -Sodium improving   #) Recent COVID-19 diagnosis:  -The patient reports that she was recently diagnosed with COVID-19 infection 2 to 3 weeks ago, but has recovered from the mild symptoms  -Continue with supportive care   #) Sepsis with esbl UTI present on admit:  -Pt presented with white blood cell count of 12,900, along with tachypnea.   -UA is suggestive of UTI with >50 WBC and large leuk with many bacteria -Urine cx thus far pos for >100,000 ESBL ecoli species -Was on empiric rocephin. Discussed with pharmacy, plan to give fosfomycin -no leukocytosis   #) Essential Hypertension:  -continue outpatient amlodipine and metoprolol titrate.   -lasix remains on hold for now per above -Bp  stable   #) Hyperlipidemia:  -continue home ezetimibe and simvastatin .     #) Paroxysmal atrial fibrillation:  -CHA2DS2-VASc score of  4 -continued on Eliquis -continue metoprolol as scheduled -remains rate controlled   #) Anemia of chronic disease:  -Hemodynamically stable - Repeat CBC in the morning.    #) Depression:  -Celexa for now, as above.  -TSH 0.425    Subjective: Reports nausea this AM, improved with antiemetic. Also has dry lips  Physical Exam: Vitals:   10/06/23 1636 10/06/23 2008 10/07/23 0414 10/07/23 0645  BP: (!) 167/65 (!) 154/61 (!) 146/60 (!) 153/64  Pulse: 73 79 63 60  Resp:  (!) 25 (!) 26 20  Temp: 99.9 F (37.7 C) 99.8 F (37.7 C) 97.7 F (36.5 C) 98.7 F (37.1 C)  TempSrc: Oral Oral Oral Oral  SpO2: 96% 94% 99% 97%  Weight:      Height:       General exam: Conversant, in no acute distress Respiratory system: normal chest rise, clear, no audible wheezing Cardiovascular system: regular rhythm, s1-s2 Gastrointestinal system: Nondistended, nontender, pos BS Central nervous system: No seizures, no tremors Extremities: No cyanosis, no joint deformities Skin: No rashes, no pallor Psychiatry: Affect normal // no auditory hallucinations   Data Reviewed:  Labs reviewed: Na 131, K 3.6, Cr 1.46, WBC 8.4, Hgb 9.6  Family Communication: Pt in room, family not at bedside  Disposition: Status is: Inpatient The patient will require care spanning > 2 midnights and should be moved to inpatient because: severity of illness  Planned Discharge Destination:  ALF    Author: Rickey Barbara, MD 10/07/2023 4:48 PM  For on  call review [email or web address].

## 2023-10-07 NOTE — Plan of Care (Signed)
  Problem: Education: Goal: Knowledge of risk factors and measures for prevention of condition will improve Outcome: Progressing   Problem: Coping: Goal: Psychosocial and spiritual needs will be supported Outcome: Progressing   Problem: Respiratory: Goal: Complications related to the disease process, condition or treatment will be avoided or minimized Outcome: Progressing   Problem: Clinical Measurements: Goal: Diagnostic test results will improve Outcome: Progressing Goal: Respiratory complications will improve Outcome: Progressing

## 2023-10-07 NOTE — Plan of Care (Signed)
  Problem: Pain Managment: Goal: General experience of comfort will improve and/or be controlled Outcome: Progressing   Problem: Safety: Goal: Ability to remain free from injury will improve Outcome: Progressing

## 2023-10-08 DIAGNOSIS — N179 Acute kidney failure, unspecified: Secondary | ICD-10-CM | POA: Diagnosis not present

## 2023-10-08 LAB — COMPREHENSIVE METABOLIC PANEL
ALT: 13 U/L (ref 0–44)
AST: 26 U/L (ref 15–41)
Albumin: 2.6 g/dL — ABNORMAL LOW (ref 3.5–5.0)
Alkaline Phosphatase: 54 U/L (ref 38–126)
Anion gap: 10 (ref 5–15)
BUN: 19 mg/dL (ref 8–23)
CO2: 20 mmol/L — ABNORMAL LOW (ref 22–32)
Calcium: 8.6 mg/dL — ABNORMAL LOW (ref 8.9–10.3)
Chloride: 104 mmol/L (ref 98–111)
Creatinine, Ser: 1.25 mg/dL — ABNORMAL HIGH (ref 0.44–1.00)
GFR, Estimated: 40 mL/min — ABNORMAL LOW (ref >60)
Glucose, Bld: 91 mg/dL (ref 70–99)
Potassium: 4 mmol/L (ref 3.5–5.1)
Sodium: 134 mmol/L — ABNORMAL LOW (ref 135–145)
Total Bilirubin: 0.5 mg/dL (ref 0.0–1.2)
Total Protein: 6 g/dL — ABNORMAL LOW (ref 6.5–8.1)

## 2023-10-08 LAB — CBC
HCT: 31.8 % — ABNORMAL LOW (ref 36.0–46.0)
Hemoglobin: 10.4 g/dL — ABNORMAL LOW (ref 12.0–15.0)
MCH: 30.1 pg (ref 26.0–34.0)
MCHC: 32.7 g/dL (ref 30.0–36.0)
MCV: 91.9 fL (ref 80.0–100.0)
Platelets: 209 10*3/uL (ref 150–400)
RBC: 3.46 MIL/uL — ABNORMAL LOW (ref 3.87–5.11)
RDW: 13.7 % (ref 11.5–15.5)
WBC: 9.1 10*3/uL (ref 4.0–10.5)
nRBC: 0 % (ref 0.0–0.2)

## 2023-10-08 MED ORDER — FUROSEMIDE 20 MG PO TABS: 10.0000 mg | ORAL_TABLET | Freq: Every day | ORAL | Status: DC

## 2023-10-08 MED ORDER — FOSFOMYCIN TROMETHAMINE 3 G PO PACK: 3.0000 g | PACK | Freq: Once | ORAL | Status: AC

## 2023-10-08 NOTE — Care Management Important Message (Signed)
 Important Message  Patient Details  Name: Ana Franco MRN: [id number] Date of Birth: [birth year]   Important Message Given:  Yes - Medicare IM     Sherilyn Banker 10/08/2023, 11:06 AM

## 2023-10-08 NOTE — Plan of Care (Signed)
  Problem: Pain Managment: Goal: General experience of comfort will improve and/or be controlled 10/08/2023 0100 by Sammuel Cooper, RN Outcome: Progressing 10/07/2023 2254 by Sammuel Cooper, RN Outcome: Progressing   Problem: Safety: Goal: Ability to remain free from injury will improve 10/08/2023 0100 by Sammuel Cooper, RN Outcome: Progressing 10/07/2023 2254 by Sammuel Cooper, RN Outcome: Progressing

## 2023-10-08 NOTE — NC FL2 (Signed)
 [place] MEDICAID FL2 LEVEL OF CARE FORM     IDENTIFICATION  Patient Name: Ana Franco Birthdate: Jan 27, 1926 Sex: female Admission Date (Current Location): 10/03/2023  [facility] and [place] Number:  Producer, television/film/video and Address:  [facility], [address], [place], [place] [address]      Provider Number: [id number]  Attending Physician Name and Address:  Marinda Elk, MD  Relative Name and Phone Number:       Current Level of Care: Hospital Recommended Level of Care: Assisted Living Facility Prior Approval Number:    Date Approved/Denied:   PASRR Number: [id number]  Discharge Plan: Other (Comment) (ALF)    Current Diagnoses: Patient Active Problem List   Diagnosis Date Noted   ARF (acute renal failure) (HCC) 10/05/2023   SIRS (systemic inflammatory response syndrome) (HCC) 10/04/2023   Anemia of chronic disease 10/04/2023   COVID-19 virus antibody detected 10/04/2023   AKI (acute kidney injury) (HCC) 10/03/2023   Hypokalemia 12/15/2022   Acute diastolic CHF (congestive heart failure) (HCC) 12/14/2022   Acute hyponatremia 12/14/2022   Normocytic anemia 12/14/2022   Coronary artery disease 12/14/2022   Hx of BKA, left (HCC) 12/14/2022   Permanent atrial fibrillation (HCC) 12/18/2021   H/O: CVA (cerebrovascular accident) 12/18/2021   History of non-ST elevation myocardial infarction (NSTEMI) 12/18/2021   Acute CVA (cerebrovascular accident) (HCC) 01/07/2021   Thrombocytopenia (HCC) 01/06/2021   TIA (transient ischemic attack) 01/06/2021   DNR (do not resuscitate) 01/06/2021   Chronic kidney disease CKD IIIb 01/06/2021   Paroxysmal atrial fibrillation (HCC) 05/04/2018   Gangrene of lower extremity (HCC)    Subacute osteomyelitis, left ankle and foot (HCC)    S/P ORIF (open reduction internal fixation) fracture 04/03/2018   Wound infection 04/02/2018   MRSA (methicillin resistant staph aureus) culture positive 04/02/2018    Acute blood loss anemia 04/02/2018   Closed left ankle fracture, with delayed healing, subsequent encounter 03/31/2018   Essential hypertension 06/23/2014   HLD (hyperlipidemia) 06/23/2014   GERD (gastroesophageal reflux disease) 06/23/2014   UTI (urinary tract infection) 06/23/2014   Depression 06/23/2014    Orientation RESPIRATION BLADDER Height & Weight     Self  Normal Incontinent Weight: 176 lb 12.9 oz (80.2 kg) Height:  5\' 8"  (172.7 cm)  BEHAVIORAL SYMPTOMS/MOOD NEUROLOGICAL BOWEL NUTRITION STATUS      Incontinent Diet (Regular, Heart Healthy)  AMBULATORY STATUS COMMUNICATION OF NEEDS Skin   Extensive Assist Verbally Skin abrasions (Bilateral Buttocks and R back MASD)                       Personal Care Assistance Level of Assistance  Bathing, Feeding, Dressing Bathing Assistance: Limited assistance Feeding assistance: Limited assistance Dressing Assistance: Limited assistance     Functional Limitations Info  Sight, Hearing, Speech Sight Info: Adequate Hearing Info: Adequate Speech Info: Adequate    SPECIAL CARE FACTORS FREQUENCY  PT (By licensed PT), OT (By licensed OT)     PT Frequency: 1x week OT Frequency: 1x week            Contractures Contractures Info: Not present    Additional Factors Info  Code Status, Allergies Code Status Info: DNR Allergies Info: NKA           Current Medications (10/08/2023):  This is the current hospital active medication list Current Facility-Administered Medications  Medication Dose Route Frequency Provider Last Rate Last Admin   acetaminophen (TYLENOL) tablet 650 mg  650 mg  Oral Q6H PRN Howerter, Justin B, DO   650 mg at 10/08/23 1146   Or   acetaminophen (TYLENOL) suppository 650 mg  650 mg Rectal Q6H PRN Howerter, Justin B, DO       apixaban (ELIQUIS) tablet 2.5 mg  2.5 mg Oral BID Howerter, Justin B, DO   2.5 mg at 10/08/23 1146   ezetimibe (ZETIA) tablet 10 mg  10 mg Oral q1800 Howerter, Justin B, DO   10  mg at 10/07/23 1715   And   simvastatin (ZOCOR) tablet 20 mg  20 mg Oral q1800 Howerter, Justin B, DO   20 mg at 10/07/23 1715   melatonin tablet 3 mg  3 mg Oral QHS PRN Howerter, Justin B, DO   3 mg at 10/06/23 2023   metoprolol tartrate (LOPRESSOR) tablet 25 mg  25 mg Oral BID Howerter, Justin B, DO   25 mg at 10/08/23 1146   ondansetron (ZOFRAN) injection 4 mg  4 mg Intravenous Q6H PRN Howerter, Justin B, DO   4 mg at 10/08/23 1146   pantoprazole (PROTONIX) EC tablet 40 mg  40 mg Oral Daily Howerter, Justin B, DO   40 mg at 10/08/23 1146     Discharge Medications:  Acetaminophen 325 MG Chew Chew 650 mg by mouth every 6 (six) hours as needed (pain, fever, headache).    apixaban 5 MG Tabs tablet Commonly known as: ELIQUIS Take 1 tablet (5 mg total) by mouth 2 (two) times daily.    beta carotene 60454 UNIT capsule Take 25,000 Units by mouth daily.    Calcium+D3 500-10 MG-MCG Tabs Generic drug: Calcium Carb-Cholecalciferol Take 1 tablet by mouth in the morning.    citalopram 20 MG tablet Commonly known as: CELEXA Take 10 mg by mouth daily.    clotrimazole 1 % cream Commonly known as: LOTRIMIN Apply 1 Application topically 2 (two) times daily. Apply to right foot    esomeprazole 40 MG capsule Commonly known as: NEXIUM Take 40 mg by mouth daily.    ezetimibe-simvastatin 10-20 MG tablet Commonly known as: VYTORIN Take 1 tablet by mouth every evening.    furosemide 20 MG tablet Commonly known as: LASIX Take 0.5 tablets (10 mg total) by mouth at bedtime. Start taking on: October 14, 2023 What changed:  These instructions start on October 14, 2023. If you are unsure what to do until then, ask your doctor or other care provider. Another medication with the same name was removed. Continue taking this medication, and follow the directions you see here.    gabapentin 100 MG capsule Commonly known as: NEURONTIN Take 100 mg by mouth 3 (three) times daily.    metoprolol tartrate 25  MG tablet Commonly known as: LOPRESSOR Take 1 tablet (25 mg total) by mouth 2 (two) times daily. Hold for SBP < 105 or HR < 60    potassium chloride 20 MEQ packet Commonly known as: KLOR-CON Take 20 mEq by mouth in the morning.    vitamin B-12 500 MCG tablet Commonly known as: CYANOCOBALAMIN Take 500 mcg by mouth every morning.           Relevant Imaging Results:  Relevant Lab Results:   Additional Information SSN-[id number]  Carley Hammed, LCSW

## 2023-10-08 NOTE — TOC Transition Note (Signed)
 Transition of Care [facility]) - Discharge Note   Patient Details  Name: Ana Franco MRN: [id number] Date of Birth: 09-Feb-1926  [facility]) CM/SW Contact:  Carley Hammed, LCSW Phone Number: 10/08/2023, 2:00 PM   Clinical Narrative:    Pt to be transported to [facility] via [facility]. Nurse to call report to [phone]. Bed 6.   Final next level of care: Skilled Nursing Facility Barriers to Discharge: Barriers Resolved   Patient Goals and CMS Choice     Choice offered to / list presented to : Adult Children      Discharge Placement              Patient chooses bed at: [facility] Patient to be transferred to facility by: [facility] Name of family member notified: Brett Canales Patient and family notified of of transfer: 10/08/23  Discharge Plan and Services Additional resources added to the After Visit Summary for       Post Acute Care Choice: Resumption of Svcs/PTA Provider                               Social Drivers of Health (SDOH) Interventions SDOH Screenings   Food Insecurity: No Food Insecurity (10/04/2023)  Housing: Low Risk  (10/04/2023)  Transportation Needs: No Transportation Needs (10/04/2023)  Utilities: Not At Risk (10/04/2023)  Social Connections: Moderately Isolated (10/05/2023)  Tobacco Use: Low Risk  (10/04/2023)     Readmission Risk Interventions     No data to display

## 2023-10-08 NOTE — Discharge Summary (Signed)
 Physician Discharge Summary  Ana Franco WUJ:[id number] DOB: [birth year] DOA: 10/03/2023  PCP: Sherron Monday, MD  Admit date: 10/03/2023 Discharge date: 10/08/2023  Admitted From: SNF Disposition:  SNF  Recommendations for Outpatient Follow-up:  Follow up with PCP in 1-2 weeks Please obtain BMP/CBC in one week   Home Health:No Equipment/Devices:None  Discharge Condition:Stable CODE STATUS:DNR Diet recommendation: Heart Healthy  Brief/Interim Summary:  89 y.o. female past history significant for essential hypertension, hyperlipidemia paroxysmal atrial fibrillation on Eliquis anemia of chronic renal disease with a baseline around 9-12 admitted to [facility] in 10/03/2023 for acute kidney injury    Discharge Diagnoses:  Principal Problem:   AKI (acute kidney injury) (HCC) Active Problems:   Essential hypertension   HLD (hyperlipidemia)   Depression   Paroxysmal atrial fibrillation (HCC)   Acute hyponatremia   SIRS (systemic inflammatory response syndrome) (HCC)   Anemia of chronic disease   COVID-19 virus antibody detected   ARF (acute renal failure) (HCC)  AKI (acute kidney injury) (HCC) Baseline like around 1.1-1.3 on admission 1.9. Lasix was held will start on IV fluids.  Her creatinine this morning is at baseline. KVO IV fluids. PT valuated the patient will need skilled nursing facility placement.   Hypovolemic hyponatremia: Sodium improved with IV fluid resuscitation.   Recent COVID-19 infection: Noted.   Sepsis with ESBL UTI: White count on admission of 12 UA suggestive of infection. Urine culture grew more than 100,000 colonies of ESBL. Continue antibiotics as an outpatient for 2 days.   Essential hypertension: Continue amlodipine and metoprolol. Can resume Lasix as an outpatient.   Hyperlipidemia: Continue current medication.   Paroxysmal atrial fibrillation: With a chads Vascor greater than 4, Currently rate controlled continue  metoprolol. Continue Eliquis.   Anemia of chronic renal disease: Hemoglobin at baseline.   Depression: Continue current medication.  Discharge Instructions  Discharge Instructions     Diet - low sodium heart healthy   Complete by: As directed    Increase activity slowly   Complete by: As directed    No wound care   Complete by: As directed       Allergies as of 10/08/2023   No Known Allergies      Medication List     STOP taking these medications    nitroGLYCERIN 0.4 MG SL tablet Commonly known as: NITROSTAT       TAKE these medications    Acetaminophen 325 MG Chew Chew 650 mg by mouth every 6 (six) hours as needed (pain, fever, headache).   apixaban 5 MG Tabs tablet Commonly known as: ELIQUIS Take 1 tablet (5 mg total) by mouth 2 (two) times daily.   beta carotene 95621 UNIT capsule Take 25,000 Units by mouth daily.   Calcium+D3 500-10 MG-MCG Tabs Generic drug: Calcium Carb-Cholecalciferol Take 1 tablet by mouth in the morning.   citalopram 20 MG tablet Commonly known as: CELEXA Take 10 mg by mouth daily.   clotrimazole 1 % cream Commonly known as: LOTRIMIN Apply 1 Application topically 2 (two) times daily. Apply to right foot   esomeprazole 40 MG capsule Commonly known as: NEXIUM Take 40 mg by mouth daily.   ezetimibe-simvastatin 10-20 MG tablet Commonly known as: VYTORIN Take 1 tablet by mouth every evening.   furosemide 20 MG tablet Commonly known as: LASIX Take 0.5 tablets (10 mg total) by mouth at bedtime. Start taking on: October 14, 2023 What changed:  These instructions start on October 14, 2023. If you are unsure what  to do until then, ask your doctor or other care provider. Another medication with the same name was removed. Continue taking this medication, and follow the directions you see here.   gabapentin 100 MG capsule Commonly known as: NEURONTIN Take 100 mg by mouth 3 (three) times daily.   metoprolol tartrate 25 MG  tablet Commonly known as: LOPRESSOR Take 1 tablet (25 mg total) by mouth 2 (two) times daily. Hold for SBP < 105 or HR < 60   potassium chloride 20 MEQ packet Commonly known as: KLOR-CON Take 20 mEq by mouth in the morning.   vitamin B-12 500 MCG tablet Commonly known as: CYANOCOBALAMIN Take 500 mcg by mouth every morning.        No Known Allergies  Consultations: None   Procedures/Studies: DG Chest 2 View Result Date: 10/03/2023 CLINICAL DATA:  Generalized body aches, myalgia EXAM: CHEST - 2 VIEW COMPARISON:  12/14/2022 FINDINGS: Frontal and lateral views of the chest demonstrate stable enlargement the cardiac silhouette and dense calcification of the mitral annulus. Chronic lung scarring greatest at the bases. No acute airspace disease, effusion, or pneumothorax. No acute bony abnormalities. IMPRESSION: 1. No acute intrathoracic process. Electronically Signed   By: Sharlet Salina M.D.   On: 10/03/2023 20:32   (Echo, Carotid, EGD, Colonoscopy, ERCP)    Subjective: No complaints  Discharge Exam: Vitals:   10/08/23 0440 10/08/23 0803  BP: (!) 166/72 (!) 171/85  Pulse: 79 83  Resp: 15 18  Temp: 98.6 F (37 C) 98 F (36.7 C)  SpO2: 96% 98%   Vitals:   10/07/23 2111 10/08/23 0338 10/08/23 0440 10/08/23 0803  BP: (!) 155/56  (!) 166/72 (!) 171/85  Pulse: 63  79 83  Resp:   15 18  Temp:   98.6 F (37 C) 98 F (36.7 C)  TempSrc:   Oral Oral  SpO2:   96% 98%  Weight:  80.2 kg    Height:        General: Pt is alert, awake, not in acute distress Cardiovascular: RRR, S1/S2 +, no rubs, no gallops Respiratory: CTA bilaterally, no wheezing, no rhonchi Abdominal: Soft, NT, ND, bowel sounds + Extremities: no edema, no cyanosis    The results of significant diagnostics from this hospitalization (including imaging, microbiology, ancillary and laboratory) are listed below for reference.     Microbiology: Recent Results (from the past 240 hours)  Resp panel by RT-PCR  (RSV, Flu A&B, Covid) Anterior Nasal Swab     Status: Abnormal   Collection Time: 10/03/23  8:15 PM   Specimen: Anterior Nasal Swab  Result Value Ref Range Status   SARS Coronavirus 2 by RT PCR POSITIVE (A) NEGATIVE Final   Influenza A by PCR NEGATIVE NEGATIVE Final   Influenza B by PCR NEGATIVE NEGATIVE Final    Comment: (NOTE) The Xpert Xpress SARS-CoV-2/FLU/RSV plus assay is intended as an aid in the diagnosis of influenza from Nasopharyngeal swab specimens and should not be used as a sole basis for treatment. Nasal washings and aspirates are unacceptable for Xpert Xpress SARS-CoV-2/FLU/RSV testing.  Fact Sheet for Patients: BloggerCourse.com  Fact Sheet for Healthcare Providers: SeriousBroker.it  This test is not yet approved or cleared by the [place] FDA and has been authorized for detection and/or diagnosis of SARS-CoV-2 by FDA under an Emergency Use Authorization (EUA). This EUA will remain in effect (meaning this test can be used) for the duration of the COVID-19 declaration under Section 564(b)(1) of the Act, 21 U.S.C. section  360bbb-3(b)(1), unless the authorization is terminated or revoked.     Resp Syncytial Virus by PCR NEGATIVE NEGATIVE Final    Comment: (NOTE) Fact Sheet for Patients: BloggerCourse.com  Fact Sheet for Healthcare Providers: SeriousBroker.it  This test is not yet approved or cleared by the [place] FDA and has been authorized for detection and/or diagnosis of SARS-CoV-2 by FDA under an Emergency Use Authorization (EUA). This EUA will remain in effect (meaning this test can be used) for the duration of the COVID-19 declaration under Section 564(b)(1) of the Act, 21 U.S.C. section 360bbb-3(b)(1), unless the authorization is terminated or revoked.  Performed at [facility], [address] [place], [place] [address]   Urine  Culture (for pregnant, neutropenic or urologic patients or patients with an indwelling urinary catheter)     Status: Abnormal   Collection Time: 10/04/23 10:45 AM   Specimen: Urine, Clean Catch  Result Value Ref Range Status   Specimen Description URINE, CLEAN CATCH  Final   Special Requests   Final    NONE Performed at [facility], [address], [place], [place] [address]    Culture (A)  Final    >=100,000 COLONIES/mL ESCHERICHIA COLI Confirmed Extended Spectrum Beta-Lactamase Producer (ESBL).  In bloodstream infections from ESBL organisms, carbapenems are preferred over piperacillin/tazobactam. They are shown to have a lower risk of mortality.    Report Status 10/06/2023 FINAL  Final   Organism ID, Bacteria ESCHERICHIA COLI (A)  Final      Susceptibility   Escherichia coli - MIC*    AMPICILLIN >=32 RESISTANT Resistant     CEFAZOLIN >=64 RESISTANT Resistant     CEFEPIME 16 RESISTANT Resistant     CEFTRIAXONE >=64 RESISTANT Resistant     CIPROFLOXACIN >=4 RESISTANT Resistant     GENTAMICIN >=16 RESISTANT Resistant     IMIPENEM <=0.25 SENSITIVE Sensitive     NITROFURANTOIN <=16 SENSITIVE Sensitive     TRIMETH/SULFA <=20 SENSITIVE Sensitive     AMPICILLIN/SULBACTAM 4 SENSITIVE Sensitive     PIP/TAZO <=4 SENSITIVE Sensitive ug/mL    * >=100,000 COLONIES/mL ESCHERICHIA COLI  MRSA Next Gen by PCR, Nasal     Status: None   Collection Time: 10/04/23 12:24 PM   Specimen: Nasal Mucosa; Nasal Swab  Result Value Ref Range Status   MRSA by PCR Next Gen NOT DETECTED NOT DETECTED Final    Comment: (NOTE) The GeneXpert MRSA Assay (FDA approved for NASAL specimens only), is one component of a comprehensive MRSA colonization surveillance program. It is not intended to diagnose MRSA infection nor to guide or monitor treatment for MRSA infections. Test performance is not FDA approved in patients less than 17 years old. Performed at [facility], [address] [place],  [place] [address]      Labs: BNP (last 3 results) Recent Labs    12/14/22 0047 10/04/23 0601  BNP 486.5* 295.6*   Basic Metabolic Panel: Recent Labs  Lab 10/03/23 2015 10/04/23 0601 10/05/23 0702 10/06/23 0701 10/07/23 0545 10/08/23 0654  NA 127* 129* 130* 130* 131* 134*  K 4.3 4.1 4.2 4.0 3.6 4.0  CL 91* 95* 97* 100 106 104  CO2 22 21* 24 23 20* 20*  GLUCOSE 112* 107* 112* 101* 102* 91  BUN 37* 34* 34* 29* 24* 19  CREATININE 1.92* 1.61* 1.77* 1.64* 1.45* 1.25*  CALCIUM 8.7* 8.3* 8.8* 8.4* 7.8* 8.6*  MG 1.9 1.8  --   --   --   --  Liver Function Tests: Recent Labs  Lab 10/04/23 0601 10/05/23 0702 10/06/23 0701 10/07/23 0545 10/08/23 0654  AST 21 25 26 24 26   ALT 9 11 11 12 13   ALKPHOS 53 59 50 46 54  BILITOT 0.7 0.7 0.6 0.5 0.5  PROT 5.9* 6.3* 5.7* 5.3* 6.0*  ALBUMIN 2.7* 2.6* 2.4* 2.3* 2.6*   No results for input(s): "LIPASE", "AMYLASE" in the last 168 hours. No results for input(s): "AMMONIA" in the last 168 hours. CBC: Recent Labs  Lab 10/04/23 0538 10/05/23 0702 10/06/23 0701 10/07/23 0545 10/08/23 0654  WBC 10.9* 10.3 10.0 8.4 9.1  NEUTROABS 7.6  --   --   --   --   HGB 9.5* 10.0* 9.6* 9.6* 10.4*  HCT 28.6* 31.1* 29.5* 29.7* 31.8*  MCV 91.4 91.2 91.6 90.3 91.9  PLT 131* 155 187 169 209   Cardiac Enzymes: No results for input(s): "CKTOTAL", "CKMB", "CKMBINDEX", "TROPONINI" in the last 168 hours. BNP: Invalid input(s): "POCBNP" CBG: No results for input(s): "GLUCAP" in the last 168 hours. D-Dimer No results for input(s): "DDIMER" in the last 72 hours. Hgb A1c No results for input(s): "HGBA1C" in the last 72 hours. Lipid Profile No results for input(s): "CHOL", "HDL", "LDLCALC", "TRIG", "CHOLHDL", "LDLDIRECT" in the last 72 hours. Thyroid function studies No results for input(s): "TSH", "T4TOTAL", "T3FREE", "THYROIDAB" in the last 72 hours.  Invalid input(s): "FREET3" Anemia work up No results for input(s): "VITAMINB12", "FOLATE",  "FERRITIN", "TIBC", "IRON", "RETICCTPCT" in the last 72 hours. Urinalysis    Component Value Date/Time   COLORURINE YELLOW 10/04/2023 0759   APPEARANCEUR CLOUDY (A) 10/04/2023 0759   LABSPEC 1.009 10/04/2023 0759   PHURINE 5.0 10/04/2023 0759   GLUCOSEU NEGATIVE 10/04/2023 0759   HGBUR MODERATE (A) 10/04/2023 0759   BILIRUBINUR NEGATIVE 10/04/2023 0759   KETONESUR NEGATIVE 10/04/2023 0759   PROTEINUR 30 (A) 10/04/2023 0759   UROBILINOGEN 0.2 06/13/2014 0018   NITRITE NEGATIVE 10/04/2023 0759   LEUKOCYTESUR LARGE (A) 10/04/2023 0759   Sepsis Labs Recent Labs  Lab 10/05/23 0702 10/06/23 0701 10/07/23 0545 10/08/23 0654  WBC 10.3 10.0 8.4 9.1   Microbiology Recent Results (from the past 240 hours)  Resp panel by RT-PCR (RSV, Flu A&B, Covid) Anterior Nasal Swab     Status: Abnormal   Collection Time: 10/03/23  8:15 PM   Specimen: Anterior Nasal Swab  Result Value Ref Range Status   SARS Coronavirus 2 by RT PCR POSITIVE (A) NEGATIVE Final   Influenza A by PCR NEGATIVE NEGATIVE Final   Influenza B by PCR NEGATIVE NEGATIVE Final    Comment: (NOTE) The Xpert Xpress SARS-CoV-2/FLU/RSV plus assay is intended as an aid in the diagnosis of influenza from Nasopharyngeal swab specimens and should not be used as a sole basis for treatment. Nasal washings and aspirates are unacceptable for Xpert Xpress SARS-CoV-2/FLU/RSV testing.  Fact Sheet for Patients: BloggerCourse.com  Fact Sheet for Healthcare Providers: SeriousBroker.it  This test is not yet approved or cleared by the [place] FDA and has been authorized for detection and/or diagnosis of SARS-CoV-2 by FDA under an Emergency Use Authorization (EUA). This EUA will remain in effect (meaning this test can be used) for the duration of the COVID-19 declaration under Section 564(b)(1) of the Act, 21 U.S.C. section 360bbb-3(b)(1), unless the authorization is terminated  or revoked.     Resp Syncytial Virus by PCR NEGATIVE NEGATIVE Final    Comment: (NOTE) Fact Sheet for Patients: BloggerCourse.com  Fact Sheet for Healthcare Providers: SeriousBroker.it  This test is not yet approved or cleared by the [place] and has been authorized for detection and/or diagnosis of SARS-CoV-2 by FDA under an Emergency Use Authorization (EUA). This EUA will remain in effect (meaning this test can be used) for the duration of the COVID-19 declaration under Section 564(b)(1) of the Act, 21 U.S.C. section 360bbb-3(b)(1), unless the authorization is terminated or revoked.  Performed at [facility], [address], [place], [place] [address]   Urine Culture (for pregnant, neutropenic or urologic patients or patients with an indwelling urinary catheter)     Status: Abnormal   Collection Time: 10/04/23 10:45 AM   Specimen: Urine, Clean Catch  Result Value Ref Range Status   Specimen Description URINE, CLEAN CATCH  Final   Special Requests   Final    NONE Performed at [facility], [address], [place], [place] [address]    Culture (A)  Final    >=100,000 COLONIES/mL ESCHERICHIA COLI Confirmed Extended Spectrum Beta-Lactamase Producer (ESBL).  In bloodstream infections from ESBL organisms, carbapenems are preferred over piperacillin/tazobactam. They are shown to have a lower risk of mortality.    Report Status 10/06/2023 FINAL  Final   Organism ID, Bacteria ESCHERICHIA COLI (A)  Final      Susceptibility   Escherichia coli - MIC*    AMPICILLIN >=32 RESISTANT Resistant     CEFAZOLIN >=64 RESISTANT Resistant     CEFEPIME 16 RESISTANT Resistant     CEFTRIAXONE >=64 RESISTANT Resistant     CIPROFLOXACIN >=4 RESISTANT Resistant     GENTAMICIN >=16 RESISTANT Resistant     IMIPENEM <=0.25 SENSITIVE Sensitive     NITROFURANTOIN <=16 SENSITIVE Sensitive     TRIMETH/SULFA <=20 SENSITIVE Sensitive      AMPICILLIN/SULBACTAM 4 SENSITIVE Sensitive     PIP/TAZO <=4 SENSITIVE Sensitive ug/mL    * >=100,000 COLONIES/mL ESCHERICHIA COLI  MRSA Next Gen by PCR, Nasal     Status: None   Collection Time: 10/04/23 12:24 PM   Specimen: Nasal Mucosa; Nasal Swab  Result Value Ref Range Status   MRSA by PCR Next Gen NOT DETECTED NOT DETECTED Final    Comment: (NOTE) The GeneXpert MRSA Assay (FDA approved for NASAL specimens only), is one component of a comprehensive MRSA colonization surveillance program. It is not intended to diagnose MRSA infection nor to guide or monitor treatment for MRSA infections. Test performance is not FDA approved in patients less than 42 years old. Performed at [facility], [address], [place], [place] [address]      Time coordinating discharge: Over 35 minutes  SIGNED:   Marinda Elk, MD  [facility] 10/08/2023, 11:01 AM Pager   If 7PM-7AM, please contact night-coverage [email or web address] Password [email or web address]

## 2023-10-08 NOTE — Progress Notes (Incomplete)
 [facility] PROGRESS NOTE    Progress Note  Ana Franco  ZOX:[id number] DOB: Feb 07, 1926 DOA: 10/03/2023 PCP: Sherron Monday, MD     Brief Narrative:   Ana Franco is an 89 y.o. female past history significant for essential hypertension, hyperlipidemia paroxysmal atrial fibrillation on Eliquis anemia of chronic renal disease with a baseline around 9-12 admitted to [facility] in 10/03/2023 for acute kidney injury   Assessment/Plan:   AKI (acute kidney injury) (HCC) Baseline like around 1.1-1.3 on admission 1.9. Lasix was held will start on IV fluids.  Her creatinine this morning is at baseline. KVO IV fluids. PT valuated the patient will need skilled nursing facility placement.  Hypovolemic hyponatremia: Sodium improved with IV fluid resuscitation.  Recent COVID-19 infection: Noted.  Sepsis with ESBL UTI: White count on admission of 12 UA suggestive of infection. Urine culture grew more than 100,000 colonies of ESBL. Was started on fosfomycin for 3 days.  Essential hypertension: Continue amlodipine and metoprolol. Continue to hold Lasix.  Hyperlipidemia: Continue current medication.  Paroxysmal atrial fibrillation: With a chads Vascor greater than 4, Currently rate controlled continue metoprolol. Continue Eliquis.  Anemia of chronic renal disease: Hemoglobin at baseline.  Depression: Continue current medication.    DVT prophylaxis: lovenoxn Family Communication:none Status is: Inpatient Remains inpatient appropriate because: Acute kidney injury    Code Status:     Code Status Orders  (From admission, onward)           Start     Ordered   10/03/23 2255  Do not attempt resuscitation (DNR)- Limited -Do Not Intubate (DNI)  Continuous       Question Answer Comment  If pulseless and not breathing No CPR or chest compressions.   In Pre-Arrest Conditions (Patient Is Breathing and Has A Pulse) Do not intubate. Provide all  appropriate non-invasive medical interventions. Avoid ICU transfer unless indicated or required.   Consent: Discussion documented in EHR or advanced directives reviewed      10/03/23 2254           Code Status History     Date Active Date Inactive Code Status Order ID Comments User Context   12/14/2022 0511 12/16/2022 2120 DNR [id number]  Darlin Drop, DO ED   12/14/2022 0414 12/14/2022 0511 Full Code [id number]  Darlin Drop, DO ED   01/06/2021 1528 01/10/2021 1106 DNR [id number]  Clydie Braun, MD ED   01/06/2021 1514 01/06/2021 1528 Full Code [id number]  Clydie Braun, MD ED   04/08/2018 1517 04/15/2018 0010 DNR [id number]  Alba Cory, MD Inpatient   04/02/2018 1919 04/08/2018 1517 Full Code [id number]  Clydie Braun, MD Inpatient   03/31/2018 1421 04/01/2018 1705 Full Code [id number]  Albina Billet III, PA-C Inpatient   06/13/2014 1633 06/16/2014 1534 Full Code [id number]  Arrie Eastern, PA-C Inpatient      Advance Directive Documentation    Flowsheet Row Most Recent Value  Type of Advance Directive Out of facility DNR (pink MOST or yellow form)  Pre-existing out of facility DNR order (yellow form or pink MOST form) Pink Most/Yellow Form available - Physician notified to receive inpatient order  "MOST" Form in Place? --         IV Access:   Peripheral IV   Procedures and diagnostic studies:   No results found.   Medical Consultants:   None.   Subjective:    Ana Franco   Objective:    Vitals:   10/07/23 2111  10/08/23 0338 10/08/23 0440 10/08/23 0803  BP: (!) 155/56  (!) 166/72 (!) 171/85  Pulse: 63  79 83  Resp:   15 18  Temp:   98.6 F (37 C) 98 F (36.7 C)  TempSrc:   Oral Oral  SpO2:   96% 98%  Weight:  80.2 kg    Height:       SpO2: 98 %   Intake/Output Summary (Last 24 hours) at 10/08/2023 1044 Last data filed at 10/08/2023 0500 Gross per 24 hour  Intake 360 ml  Output 550 ml  Net -190 ml   Filed Weights   10/03/23 1954  10/05/23 0500 10/08/23 0338  Weight: 81.2 kg 80.2 kg 80.2 kg    Exam: General exam: In no acute distress. Respiratory system: Good air movement and clear to auscultation. Cardiovascular system: S1 & S2 heard, RRR. No JVD, murmurs, rubs, gallops or clicks.  Gastrointestinal system: Abdomen is nondistended, soft and nontender.  Central nervous system: Alert and oriented. No focal neurological deficits. Extremities: No pedal edema. Skin: No rashes, lesions or ulcers Psychiatry: Judgement and insight appear normal. Mood & affect appropriate.    Data Reviewed:    Labs: Basic Metabolic Panel: Recent Labs  Lab 10/03/23 2015 10/04/23 0601 10/05/23 0702 10/06/23 0701 10/07/23 0545 10/08/23 0654  NA 127* 129* 130* 130* 131* 134*  K 4.3 4.1 4.2 4.0 3.6 4.0  CL 91* 95* 97* 100 106 104  CO2 22 21* 24 23 20* 20*  GLUCOSE 112* 107* 112* 101* 102* 91  BUN 37* 34* 34* 29* 24* 19  CREATININE 1.92* 1.61* 1.77* 1.64* 1.45* 1.25*  CALCIUM 8.7* 8.3* 8.8* 8.4* 7.8* 8.6*  MG 1.9 1.8  --   --   --   --    GFR Estimated Creatinine Clearance: 29.9 mL/min (A) (by C-G formula based on SCr of 1.25 mg/dL (H)). Liver Function Tests: Recent Labs  Lab 10/04/23 0601 10/05/23 0702 10/06/23 0701 10/07/23 0545 10/08/23 0654  AST 21 25 26 24 26   ALT 9 11 11 12 13   ALKPHOS 53 59 50 46 54  BILITOT 0.7 0.7 0.6 0.5 0.5  PROT 5.9* 6.3* 5.7* 5.3* 6.0*  ALBUMIN 2.7* 2.6* 2.4* 2.3* 2.6*   No results for input(s): "LIPASE", "AMYLASE" in the last 168 hours. No results for input(s): "AMMONIA" in the last 168 hours. Coagulation profile No results for input(s): "INR", "PROTIME" in the last 168 hours. COVID-19 Labs  No results for input(s): "DDIMER", "FERRITIN", "LDH", "CRP" in the last 72 hours.  Lab Results  Component Value Date   SARSCOV2NAA POSITIVE (A) 10/03/2023   SARSCOV2NAA NEGATIVE 01/09/2021   SARSCOV2NAA NEGATIVE 01/06/2021    CBC: Recent Labs  Lab 10/04/23 0538 10/05/23 0702  10/06/23 0701 10/07/23 0545 10/08/23 0654  WBC 10.9* 10.3 10.0 8.4 9.1  NEUTROABS 7.6  --   --   --   --   HGB 9.5* 10.0* 9.6* 9.6* 10.4*  HCT 28.6* 31.1* 29.5* 29.7* 31.8*  MCV 91.4 91.2 91.6 90.3 91.9  PLT 131* 155 187 169 209   Cardiac Enzymes: No results for input(s): "CKTOTAL", "CKMB", "CKMBINDEX", "TROPONINI" in the last 168 hours. BNP (last 3 results) No results for input(s): "PROBNP" in the last 8760 hours. CBG: No results for input(s): "GLUCAP" in the last 168 hours. D-Dimer: No results for input(s): "DDIMER" in the last 72 hours. Hgb A1c: No results for input(s): "HGBA1C" in the last 72 hours. Lipid Profile: No results for input(s): "CHOL", "HDL", "LDLCALC", "TRIG", "  CHOLHDL", "LDLDIRECT" in the last 72 hours. Thyroid function studies: No results for input(s): "TSH", "T4TOTAL", "T3FREE", "THYROIDAB" in the last 72 hours.  Invalid input(s): "FREET3" Anemia work up: No results for input(s): "VITAMINB12", "FOLATE", "FERRITIN", "TIBC", "IRON", "RETICCTPCT" in the last 72 hours. Sepsis Labs: Recent Labs  Lab 10/04/23 0601 10/05/23 0702 10/06/23 0701 10/07/23 0545 10/08/23 0654  PROCALCITON 0.41  --   --   --   --   WBC  --  10.3 10.0 8.4 9.1   Microbiology Recent Results (from the past 240 hours)  Resp panel by RT-PCR (RSV, Flu A&B, Covid) Anterior Nasal Swab     Status: Abnormal   Collection Time: 10/03/23  8:15 PM   Specimen: Anterior Nasal Swab  Result Value Ref Range Status   SARS Coronavirus 2 by RT PCR POSITIVE (A) NEGATIVE Final   Influenza A by PCR NEGATIVE NEGATIVE Final   Influenza B by PCR NEGATIVE NEGATIVE Final    Comment: (NOTE) The Xpert Xpress SARS-CoV-2/FLU/RSV plus assay is intended as an aid in the diagnosis of influenza from Nasopharyngeal swab specimens and should not be used as a sole basis for treatment. Nasal washings and aspirates are unacceptable for Xpert Xpress SARS-CoV-2/FLU/RSV testing.  Fact Sheet for  Patients: BloggerCourse.com  Fact Sheet for Healthcare Providers: SeriousBroker.it  This test is not yet approved or cleared by the [place] FDA and has been authorized for detection and/or diagnosis of SARS-CoV-2 by FDA under an Emergency Use Authorization (EUA). This EUA will remain in effect (meaning this test can be used) for the duration of the COVID-19 declaration under Section 564(b)(1) of the Act, 21 U.S.C. section 360bbb-3(b)(1), unless the authorization is terminated or revoked.     Resp Syncytial Virus by PCR NEGATIVE NEGATIVE Final    Comment: (NOTE) Fact Sheet for Patients: BloggerCourse.com  Fact Sheet for Healthcare Providers: SeriousBroker.it  This test is not yet approved or cleared by the [place] FDA and has been authorized for detection and/or diagnosis of SARS-CoV-2 by FDA under an Emergency Use Authorization (EUA). This EUA will remain in effect (meaning this test can be used) for the duration of the COVID-19 declaration under Section 564(b)(1) of the Act, 21 U.S.C. section 360bbb-3(b)(1), unless the authorization is terminated or revoked.  Performed at [facility], [address] [place], [place] [address]   Urine Culture (for pregnant, neutropenic or urologic patients or patients with an indwelling urinary catheter)     Status: Abnormal   Collection Time: 10/04/23 10:45 AM   Specimen: Urine, Clean Catch  Result Value Ref Range Status   Specimen Description URINE, CLEAN CATCH  Final   Special Requests   Final    NONE Performed at [facility], [address], [place], [place] [address]    Culture (A)  Final    >=100,000 COLONIES/mL ESCHERICHIA COLI Confirmed Extended Spectrum Beta-Lactamase Producer (ESBL).  In bloodstream infections from ESBL organisms, carbapenems are preferred over piperacillin/tazobactam. They are shown to  have a lower risk of mortality.    Report Status 10/06/2023 FINAL  Final   Organism ID, Bacteria ESCHERICHIA COLI (A)  Final      Susceptibility   Escherichia coli - MIC*    AMPICILLIN >=32 RESISTANT Resistant     CEFAZOLIN >=64 RESISTANT Resistant     CEFEPIME 16 RESISTANT Resistant     CEFTRIAXONE >=64 RESISTANT Resistant     CIPROFLOXACIN >=4 RESISTANT Resistant     GENTAMICIN >=16 RESISTANT Resistant  IMIPENEM <=0.25 SENSITIVE Sensitive     NITROFURANTOIN <=16 SENSITIVE Sensitive     TRIMETH/SULFA <=20 SENSITIVE Sensitive     AMPICILLIN/SULBACTAM 4 SENSITIVE Sensitive     PIP/TAZO <=4 SENSITIVE Sensitive ug/mL    * >=100,000 COLONIES/mL ESCHERICHIA COLI  MRSA Next Gen by PCR, Nasal     Status: None   Collection Time: 10/04/23 12:24 PM   Specimen: Nasal Mucosa; Nasal Swab  Result Value Ref Range Status   MRSA by PCR Next Gen NOT DETECTED NOT DETECTED Final    Comment: (NOTE) The GeneXpert MRSA Assay (FDA approved for NASAL specimens only), is one component of a comprehensive MRSA colonization surveillance program. It is not intended to diagnose MRSA infection nor to guide or monitor treatment for MRSA infections. Test performance is not FDA approved in patients less than 67 years old. Performed at [facility], [address], [place], [place] [address]      Medications:    apixaban  2.5 mg Oral BID   ezetimibe  10 mg Oral q1800   And   simvastatin  20 mg Oral q1800   metoprolol tartrate  25 mg Oral BID   pantoprazole  40 mg Oral Daily   Continuous Infusions:    LOS: 3 days   Marinda Elk  [facility]  10/08/2023, 10:44 AM

## 2023-10-08 NOTE — Progress Notes (Signed)
 [facility] called at [phone]. Report called to [facility]. Awaiting PTAR at this time.

## 2023-10-11 ENCOUNTER — Inpatient Hospital Stay (HOSPITAL_COMMUNITY)
Admission: EM | Admit: 2023-10-11 | Discharge: 2023-10-14 | DRG: 291 | Disposition: A | Discharge status: Nursing Home (Includes ICF) | Source: Skilled Nursing Facility | Attending: Internal Medicine | Admitting: Internal Medicine

## 2023-10-11 DIAGNOSIS — Z8616 Personal history of COVID-19: Secondary | ICD-10-CM

## 2023-10-11 DIAGNOSIS — I5031 Acute diastolic (congestive) heart failure: Secondary | ICD-10-CM | POA: Diagnosis present

## 2023-10-11 DIAGNOSIS — D649 Anemia, unspecified: Secondary | ICD-10-CM | POA: Diagnosis present

## 2023-10-11 DIAGNOSIS — Z79899 Other long term (current) drug therapy: Secondary | ICD-10-CM

## 2023-10-11 DIAGNOSIS — K219 Gastro-esophageal reflux disease without esophagitis: Secondary | ICD-10-CM | POA: Diagnosis present

## 2023-10-11 DIAGNOSIS — Z89512 Acquired absence of left leg below knee: Secondary | ICD-10-CM

## 2023-10-11 DIAGNOSIS — J069 Acute upper respiratory infection, unspecified: Secondary | ICD-10-CM | POA: Diagnosis present

## 2023-10-11 DIAGNOSIS — I480 Paroxysmal atrial fibrillation: Secondary | ICD-10-CM | POA: Diagnosis present

## 2023-10-11 DIAGNOSIS — N1832 Chronic kidney disease, stage 3b: Secondary | ICD-10-CM | POA: Diagnosis present

## 2023-10-11 DIAGNOSIS — I4821 Permanent atrial fibrillation: Secondary | ICD-10-CM | POA: Diagnosis present

## 2023-10-11 DIAGNOSIS — I10 Essential (primary) hypertension: Secondary | ICD-10-CM | POA: Diagnosis present

## 2023-10-11 DIAGNOSIS — I130 Hypertensive heart and chronic kidney disease with heart failure and stage 1 through stage 4 chronic kidney disease, or unspecified chronic kidney disease: Secondary | ICD-10-CM | POA: Diagnosis not present

## 2023-10-11 DIAGNOSIS — I50811 Acute right heart failure: Secondary | ICD-10-CM | POA: Diagnosis not present

## 2023-10-11 DIAGNOSIS — I5023 Acute on chronic systolic (congestive) heart failure: Secondary | ICD-10-CM | POA: Diagnosis present

## 2023-10-11 DIAGNOSIS — F329 Major depressive disorder, single episode, unspecified: Secondary | ICD-10-CM | POA: Diagnosis present

## 2023-10-11 DIAGNOSIS — Z66 Do not resuscitate: Secondary | ICD-10-CM | POA: Diagnosis present

## 2023-10-11 DIAGNOSIS — I252 Old myocardial infarction: Secondary | ICD-10-CM

## 2023-10-11 DIAGNOSIS — Z7901 Long term (current) use of anticoagulants: Secondary | ICD-10-CM

## 2023-10-11 DIAGNOSIS — I509 Heart failure, unspecified: Secondary | ICD-10-CM

## 2023-10-11 DIAGNOSIS — D631 Anemia in chronic kidney disease: Secondary | ICD-10-CM | POA: Diagnosis present

## 2023-10-11 DIAGNOSIS — E785 Hyperlipidemia, unspecified: Secondary | ICD-10-CM | POA: Diagnosis present

## 2023-10-12 ENCOUNTER — Other Ambulatory Visit: Payer: Self-pay

## 2023-10-12 ENCOUNTER — Emergency Department (HOSPITAL_COMMUNITY)

## 2023-10-12 ENCOUNTER — Encounter (HOSPITAL_COMMUNITY): Payer: Self-pay | Admitting: Internal Medicine

## 2023-10-12 DIAGNOSIS — Z89512 Acquired absence of left leg below knee: Secondary | ICD-10-CM | POA: Diagnosis not present

## 2023-10-12 DIAGNOSIS — Z79899 Other long term (current) drug therapy: Secondary | ICD-10-CM | POA: Diagnosis not present

## 2023-10-12 DIAGNOSIS — J069 Acute upper respiratory infection, unspecified: Secondary | ICD-10-CM | POA: Diagnosis present

## 2023-10-12 DIAGNOSIS — K219 Gastro-esophageal reflux disease without esophagitis: Secondary | ICD-10-CM | POA: Diagnosis present

## 2023-10-12 DIAGNOSIS — I509 Heart failure, unspecified: Secondary | ICD-10-CM

## 2023-10-12 DIAGNOSIS — D649 Anemia, unspecified: Secondary | ICD-10-CM | POA: Diagnosis not present

## 2023-10-12 DIAGNOSIS — I252 Old myocardial infarction: Secondary | ICD-10-CM | POA: Diagnosis not present

## 2023-10-12 DIAGNOSIS — Z7901 Long term (current) use of anticoagulants: Secondary | ICD-10-CM | POA: Diagnosis not present

## 2023-10-12 DIAGNOSIS — I480 Paroxysmal atrial fibrillation: Secondary | ICD-10-CM

## 2023-10-12 DIAGNOSIS — I50811 Acute right heart failure: Secondary | ICD-10-CM | POA: Diagnosis present

## 2023-10-12 DIAGNOSIS — E785 Hyperlipidemia, unspecified: Secondary | ICD-10-CM | POA: Diagnosis present

## 2023-10-12 DIAGNOSIS — N1832 Chronic kidney disease, stage 3b: Secondary | ICD-10-CM | POA: Diagnosis present

## 2023-10-12 DIAGNOSIS — I5023 Acute on chronic systolic (congestive) heart failure: Secondary | ICD-10-CM | POA: Diagnosis present

## 2023-10-12 DIAGNOSIS — Z66 Do not resuscitate: Secondary | ICD-10-CM | POA: Diagnosis present

## 2023-10-12 DIAGNOSIS — I130 Hypertensive heart and chronic kidney disease with heart failure and stage 1 through stage 4 chronic kidney disease, or unspecified chronic kidney disease: Secondary | ICD-10-CM | POA: Diagnosis present

## 2023-10-12 DIAGNOSIS — F329 Major depressive disorder, single episode, unspecified: Secondary | ICD-10-CM | POA: Diagnosis present

## 2023-10-12 DIAGNOSIS — I4821 Permanent atrial fibrillation: Secondary | ICD-10-CM | POA: Diagnosis present

## 2023-10-12 DIAGNOSIS — Z8616 Personal history of COVID-19: Secondary | ICD-10-CM | POA: Diagnosis not present

## 2023-10-12 DIAGNOSIS — D631 Anemia in chronic kidney disease: Secondary | ICD-10-CM | POA: Diagnosis present

## 2023-10-12 LAB — CBC WITH DIFFERENTIAL/PLATELET
Abs Immature Granulocytes: 0.02 10*3/uL (ref 0.00–0.07)
Abs Immature Granulocytes: 0.03 10*3/uL (ref 0.00–0.07)
Basophils Absolute: 0 10*3/uL (ref 0.0–0.1)
Basophils Absolute: 0 10*3/uL (ref 0.0–0.1)
Basophils Relative: 0 %
Basophils Relative: 0 %
Eosinophils Absolute: 0.2 10*3/uL (ref 0.0–0.5)
Eosinophils Absolute: 0.2 10*3/uL (ref 0.0–0.5)
Eosinophils Relative: 2 %
Eosinophils Relative: 2 %
HCT: 30.1 % — ABNORMAL LOW (ref 36.0–46.0)
HCT: 31.5 % — ABNORMAL LOW (ref 36.0–46.0)
Hemoglobin: 9.4 g/dL — ABNORMAL LOW (ref 12.0–15.0)
Hemoglobin: 9.7 g/dL — ABNORMAL LOW (ref 12.0–15.0)
Immature Granulocytes: 0 %
Immature Granulocytes: 0 %
Lymphocytes Relative: 24 %
Lymphocytes Relative: 32 %
Lymphs Abs: 1.8 10*3/uL (ref 0.7–4.0)
Lymphs Abs: 2.1 10*3/uL (ref 0.7–4.0)
MCH: 29 pg (ref 26.0–34.0)
MCH: 29.6 pg (ref 26.0–34.0)
MCHC: 30.8 g/dL (ref 30.0–36.0)
MCHC: 31.2 g/dL (ref 30.0–36.0)
MCV: 94 fL (ref 80.0–100.0)
MCV: 94.7 fL (ref 80.0–100.0)
Monocytes Absolute: 0.6 10*3/uL (ref 0.1–1.0)
Monocytes Absolute: 0.8 10*3/uL (ref 0.1–1.0)
Monocytes Relative: 10 %
Monocytes Relative: 10 %
Neutro Abs: 3.6 10*3/uL (ref 1.7–7.7)
Neutro Abs: 4.8 10*3/uL (ref 1.7–7.7)
Neutrophils Relative %: 56 %
Neutrophils Relative %: 64 %
Platelets: 198 10*3/uL (ref 150–400)
Platelets: 245 10*3/uL (ref 150–400)
RBC: 3.18 MIL/uL — ABNORMAL LOW (ref 3.87–5.11)
RBC: 3.35 MIL/uL — ABNORMAL LOW (ref 3.87–5.11)
RDW: 13.8 % (ref 11.5–15.5)
RDW: 13.8 % (ref 11.5–15.5)
WBC: 6.4 10*3/uL (ref 4.0–10.5)
WBC: 7.5 10*3/uL (ref 4.0–10.5)
nRBC: 0 % (ref 0.0–0.2)
nRBC: 0 % (ref 0.0–0.2)

## 2023-10-12 LAB — TSH: TSH: 1.56 u[IU]/mL (ref 0.350–4.500)

## 2023-10-12 LAB — BASIC METABOLIC PANEL
Anion gap: 11 (ref 5–15)
Anion gap: 11 (ref 5–15)
BUN: 11 mg/dL (ref 8–23)
BUN: 11 mg/dL (ref 8–23)
CO2: 19 mmol/L — ABNORMAL LOW (ref 22–32)
CO2: 22 mmol/L (ref 22–32)
Calcium: 8.8 mg/dL — ABNORMAL LOW (ref 8.9–10.3)
Calcium: 9 mg/dL (ref 8.9–10.3)
Chloride: 106 mmol/L (ref 98–111)
Chloride: 107 mmol/L (ref 98–111)
Creatinine, Ser: 1.02 mg/dL — ABNORMAL HIGH (ref 0.44–1.00)
Creatinine, Ser: 1.06 mg/dL — ABNORMAL HIGH (ref 0.44–1.00)
GFR, Estimated: 48 mL/min — ABNORMAL LOW (ref >60)
GFR, Estimated: 51 mL/min — ABNORMAL LOW (ref >60)
Glucose, Bld: 110 mg/dL — ABNORMAL HIGH (ref 70–99)
Glucose, Bld: 97 mg/dL (ref 70–99)
Potassium: 3.9 mmol/L (ref 3.5–5.1)
Potassium: 4.1 mmol/L (ref 3.5–5.1)
Sodium: 137 mmol/L (ref 135–145)
Sodium: 139 mmol/L (ref 135–145)

## 2023-10-12 LAB — TROPONIN I (HIGH SENSITIVITY)
Troponin I (High Sensitivity): 15 ng/L (ref <18)
Troponin I (High Sensitivity): 15 ng/L (ref <18)

## 2023-10-12 LAB — HEPATIC FUNCTION PANEL
ALT: 12 U/L (ref 0–44)
AST: 30 U/L (ref 15–41)
Albumin: 2.7 g/dL — ABNORMAL LOW (ref 3.5–5.0)
Alkaline Phosphatase: 57 U/L (ref 38–126)
Bilirubin, Direct: 0.2 mg/dL (ref 0.0–0.2)
Indirect Bilirubin: 0.4 mg/dL (ref 0.3–0.9)
Total Bilirubin: 0.6 mg/dL (ref 0.0–1.2)
Total Protein: 6.1 g/dL — ABNORMAL LOW (ref 6.5–8.1)

## 2023-10-12 LAB — MAGNESIUM: Magnesium: 1.8 mg/dL (ref 1.7–2.4)

## 2023-10-12 LAB — BRAIN NATRIURETIC PEPTIDE: B Natriuretic Peptide: 453.6 pg/mL — ABNORMAL HIGH (ref 0.0–100.0)

## 2023-10-12 MED ORDER — POTASSIUM CHLORIDE 20 MEQ PO PACK
20.0000 meq | PACK | Freq: Every day | ORAL | Status: DC
  Administered 2023-10-12: 20 meq via ORAL
  Filled 2023-10-12 (×2): qty 1

## 2023-10-12 MED ORDER — APIXABAN 5 MG PO TABS
5.0000 mg | ORAL_TABLET | Freq: Two times a day (BID) | ORAL | Status: DC
  Administered 2023-10-12 – 2023-10-14 (×5): 5 mg via ORAL
  Filled 2023-10-12 (×3): qty 1
  Filled 2023-10-12: qty 2
  Filled 2023-10-12: qty 1

## 2023-10-12 MED ORDER — EZETIMIBE 10 MG PO TABS
10.0000 mg | ORAL_TABLET | Freq: Every evening | ORAL | Status: DC
  Administered 2023-10-12 – 2023-10-13 (×2): 10 mg via ORAL
  Filled 2023-10-12 (×2): qty 1

## 2023-10-12 MED ORDER — EZETIMIBE-SIMVASTATIN 10-20 MG PO TABS: 1.0000 | ORAL_TABLET | Freq: Every evening | ORAL | Status: DC

## 2023-10-12 MED ORDER — FUROSEMIDE 10 MG/ML IJ SOLN
20.0000 mg | Freq: Two times a day (BID) | INTRAMUSCULAR | Status: AC
  Administered 2023-10-12 – 2023-10-13 (×3): 20 mg via INTRAVENOUS
  Filled 2023-10-12 (×4): qty 2

## 2023-10-12 MED ORDER — CITALOPRAM HYDROBROMIDE 10 MG PO TABS
10.0000 mg | ORAL_TABLET | Freq: Every day | ORAL | Status: DC
  Administered 2023-10-12 – 2023-10-14 (×3): 10 mg via ORAL
  Filled 2023-10-12 (×3): qty 1

## 2023-10-12 MED ORDER — VITAMIN B-12 1000 MCG PO TABS
500.0000 ug | ORAL_TABLET | Freq: Every morning | ORAL | Status: DC
  Administered 2023-10-12 – 2023-10-14 (×3): 500 ug via ORAL
  Filled 2023-10-12 (×3): qty 1

## 2023-10-12 MED ORDER — SIMVASTATIN 20 MG PO TABS
20.0000 mg | ORAL_TABLET | Freq: Every day | ORAL | Status: DC
  Administered 2023-10-12 – 2023-10-13 (×2): 20 mg via ORAL
  Filled 2023-10-12 (×2): qty 1

## 2023-10-12 MED ORDER — GABAPENTIN 100 MG PO CAPS
100.0000 mg | ORAL_CAPSULE | Freq: Three times a day (TID) | ORAL | Status: DC
  Administered 2023-10-12 – 2023-10-14 (×7): 100 mg via ORAL
  Filled 2023-10-12 (×7): qty 1

## 2023-10-12 MED ORDER — PANTOPRAZOLE SODIUM 40 MG PO TBEC
40.0000 mg | DELAYED_RELEASE_TABLET | Freq: Every day | ORAL | Status: DC
  Administered 2023-10-12 – 2023-10-14 (×3): 40 mg via ORAL
  Filled 2023-10-12 (×3): qty 1

## 2023-10-12 MED ORDER — METOPROLOL TARTRATE 25 MG PO TABS
25.0000 mg | ORAL_TABLET | Freq: Two times a day (BID) | ORAL | Status: DC
  Administered 2023-10-12 – 2023-10-14 (×4): 25 mg via ORAL
  Filled 2023-10-12 (×4): qty 1

## 2023-10-12 MED ORDER — ACETAMINOPHEN 160 MG/5ML PO SOLN
650.0000 mg | Freq: Four times a day (QID) | ORAL | Status: DC | PRN
  Administered 2023-10-12: 650 mg via ORAL
  Filled 2023-10-12: qty 20.3

## 2023-10-12 MED ORDER — FUROSEMIDE 10 MG/ML IJ SOLN
40.0000 mg | Freq: Once | INTRAMUSCULAR | Status: AC
  Administered 2023-10-12: 40 mg via INTRAVENOUS
  Filled 2023-10-12: qty 4

## 2023-10-12 NOTE — ED Triage Notes (Signed)
 Pt with PMX CHF, AFIB, BIBA from [facility] with c/o [facility] and pain between shoulder blades on exertion. Pt recently discharged from hospital. Positive for COVID during admission on 2/21. Pt placed on 2LNC by facility due to saturations less than 90%. 100% room air on arrival. Pt denies any dizziness, chest pain, NV.

## 2023-10-12 NOTE — H&P (Signed)
 History and Physical    Ana Franco ZOX:[id number] DOB: 1926/07/21 DOA: 10/11/2023  Patient coming from: Assisted living facility.  Chief Complaint: Shortness of breath.  HPI: Ana Franco is a 89 y.o. female with history of atrial fibrillation, diastolic CHF, chronic kidney disease stage III, chronic anemia, GERD, hyperlipidemia who was recently admitted to the hospital for acute renal failure with COVID infection and ESBL UTI at the time patient was hydrated and patient's Lasix was held and to be restarted on October 14, 2023 has been experiencing shortness of breath last 2 days on exertion.  Denies any chest pain has been having some wheezing.  ED Course: In the ER chest x-ray shows congestion and bilateral pleural effusion.  BNP was 453 troponin is 15 and 15 EKG shows atrial fibrillation.  Creatinine 1.02 which is at baseline hemoglobin 9.4.  Patient was given Lasix 40 mg IV and admitted for further observation and management of CHF.  Review of Systems: As per HPI, rest all negative.   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)    Chronic venous insufficiency    Depression    Difficulty in walking    Displaced trimalleolar fracture of left lower leg    Essential (primary) hypertension    GERD (gastroesophageal reflux disease)    History of blood transfusion    02/2018    History of falling    Hyperlipidemia    Hyperlipidemia    Hypertension    Infection and inflammatory reaction due to other internal orthopedic prosthetic devices, implants and grafts, subsequent encounter    Iron deficiency    Major depressive disorder    Muscle weakness (generalized)    Myocardial infarction (HCC)    nonstemi mi - 02/19/2018 at [facility] after ankle fracture    Nutritional deficiency, unspecified    Obesity    Other lack of coordination    Unspecified atrial  fibrillation (HCC)    Unspecified osteoarthritis, unspecified site     Past Surgical History:  Procedure Laterality Date   AMPUTATION Left 04/10/2018   Procedure: LEFT BELOW KNEE AMPUTATION;  Surgeon: Nadara Mustard, MD;  Location: [facility] OR;  Service: Orthopedics;  Laterality: Left;   APPENDECTOMY     APPLICATION OF WOUND VAC Left 03/31/2018   Procedure: APPLICATION OF WOUND VAC;  Surgeon: Sheral Apley, MD;  Location: [facility] ORS;  Service: Orthopedics;  Laterality: Left;   HARDWARE REMOVAL Left 03/31/2018   Procedure: HARDWARE REMOVAL;  Surgeon: Sheral Apley, MD;  Location: [facility] ORS;  Service: Orthopedics;  Laterality: Left;   IRRIGATION AND DEBRIDEMENT FOOT Left 03/31/2018   Procedure: IRRIGATION AND DEBRIDEMENT OF LEFT ANKLE;  Surgeon: Sheral Apley, MD;  Location: [facility] OR[facility];  Service: Orthopedics;  Laterality: Left;   ORIF ANKLE FRACTURE Right 06/13/2014   Procedure: OPEN REDUCTION INTERNAL FIXATION (ORIF) BIMALLEOLAR ANKLE FRACTURE;  Surgeon: Loreta Ave, MD;  Location: [facility] OR;  Service: Orthopedics;  Laterality: Right;     reports that she has never smoked. She has never used smokeless tobacco. She reports that she does not drink alcohol and does not use drugs.  No Known Allergies  History reviewed. No pertinent family history.  Prior to Admission medications   Medication Sig Start Date End Date Taking? Authorizing Provider  Acetaminophen 325 MG CHEW Chew 650 mg by mouth every 6 (six) hours as  needed (pain, fever, headache).   Yes [provider]  apixaban (ELIQUIS) 5 MG TABS tablet Take 1 tablet (5 mg total) by mouth 2 (two) times daily. 01/09/21  Yes Tyrone Nine, MD  beta carotene [address] UNIT capsule Take 25,000 Units by mouth daily. 12/07/21  Yes [provider]  CALCIUM+D3 500-10 MG-MCG TABS Take 1 tablet by mouth in the morning. 12/16/22  Yes [provider]  citalopram (CELEXA) 20 MG tablet Take 10 mg by mouth daily.   Yes [provider]   clotrimazole (LOTRIMIN) 1 % cream Apply 1 Application topically 2 (two) times daily. Apply to right foot 04/29/23  Yes [provider]  esomeprazole (NEXIUM) 40 MG capsule Take 40 mg by mouth daily.    Yes [provider]  ezetimibe-simvastatin (VYTORIN) 10-20 MG tablet Take 1 tablet by mouth every evening. 11/17/21  Yes [provider]  furosemide (LASIX) 20 MG tablet Take 0.5 tablets (10 mg total) by mouth at bedtime. Patient not taking: Reported on 10/12/2023 10/14/23   Marinda Elk, MD  gabapentin (NEURONTIN) 100 MG capsule Take 100 mg by mouth 3 (three) times daily. 11/28/21  Yes [provider]  metoprolol tartrate (LOPRESSOR) 25 MG tablet Take 1 tablet (25 mg total) by mouth 2 (two) times daily. Hold for SBP < 105 or HR < 60 12/16/22  Yes Danford, Earl Lites, MD  potassium chloride (KLOR-CON) 20 MEQ packet Take 20 mEq by mouth in the morning.   Yes [provider]  vitamin B-12 (CYANOCOBALAMIN) 500 MCG tablet Take 500 mcg by mouth every morning.   Yes [provider]    Physical Exam: Constitutional: Moderately built and nourished. Vitals:   10/12/23 0200 10/12/23 0230 10/12/23 0330 10/12/23 0356  BP: (!) 153/46 (!) 158/56 (!) 170/69   Pulse: (!) 49 (!) 56 (!) 57   Resp: 15 16 12    Temp:    98 F (36.7 C)  TempSrc:    Oral  SpO2: 98% 96% 99%    Eyes: Anicteric no pallor. ENMT: No discharge from the ears eyes nose or mouth. Neck: No mass felt.  No JVD appreciated. Respiratory: No rhonchi or crepitations. Cardiovascular: S1-S2 heard. Abdomen: Soft nontender bowel sound present. Musculoskeletal: No edema.  Left BKA. Skin: No rash. Neurologic: Alert awake oriented time place and person.  Moves all extremities. Psychiatric: Appears normal.  Normal affect.   Labs on Admission: I have personally reviewed following labs and imaging studies  CBC: Recent Labs  Lab 10/05/23 0702 10/06/23 0701 10/07/23 0545 10/08/23 0654  10/12/23 0010  WBC 10.3 10.0 8.4 9.1 7.5  NEUTROABS  --   --   --   --  4.8  HGB 10.0* 9.6* 9.6* 10.4* 9.4*  HCT 31.1* 29.5* 29.7* 31.8* 30.1*  MCV 91.2 91.6 90.3 91.9 94.7  PLT 155 187 169 209 245   Basic Metabolic Panel: Recent Labs  Lab 10/05/23 0702 10/06/23 0701 10/07/23 0545 10/08/23 0654 10/12/23 0010  NA 130* 130* 131* 134* 137  K 4.2 4.0 3.6 4.0 4.1  CL 97* 100 106 104 107  CO2 24 23 20* 20* 19*  GLUCOSE 112* 101* 102* 91 97  BUN 34* 29* 24* 19 11  CREATININE 1.77* 1.64* 1.45* 1.25* 1.02*  CALCIUM 8.8* 8.4* 7.8* 8.6* 8.8*   GFR: Estimated Creatinine Clearance: 36.7 mL/min (A) (by C-G formula based on SCr of 1.02 mg/dL (H)). Liver Function Tests: Recent Labs  Lab 10/05/23 [phone] 10/06/23 0701 10/07/23 0545 10/08/23  0654  AST 25 26 24 26   ALT 11 11 12 13   ALKPHOS 59 50 46 54  BILITOT 0.7 0.6 0.5 0.5  PROT 6.3* 5.7* 5.3* 6.0*  ALBUMIN 2.6* 2.4* 2.3* 2.6*   No results for input(s): "LIPASE", "AMYLASE" in the last 168 hours. No results for input(s): "AMMONIA" in the last 168 hours. Coagulation Profile: No results for input(s): "INR", "PROTIME" in the last 168 hours. Cardiac Enzymes: No results for input(s): "CKTOTAL", "CKMB", "CKMBINDEX", "TROPONINI" in the last 168 hours. BNP (last 3 results) No results for input(s): "PROBNP" in the last 8760 hours. HbA1C: No results for input(s): "HGBA1C" in the last 72 hours. CBG: No results for input(s): "GLUCAP" in the last 168 hours. Lipid Profile: No results for input(s): "CHOL", "HDL", "LDLCALC", "TRIG", "CHOLHDL", "LDLDIRECT" in the last 72 hours. Thyroid Function Tests: No results for input(s): "TSH", "T4TOTAL", "FREET4", "T3FREE", "THYROIDAB" in the last 72 hours. Anemia Panel: No results for input(s): "VITAMINB12", "FOLATE", "FERRITIN", "TIBC", "IRON", "RETICCTPCT" in the last 72 hours. Urine analysis:    Component Value Date/Time   COLORURINE YELLOW 10/04/2023 0759   APPEARANCEUR CLOUDY (A) 10/04/2023  0759   LABSPEC 1.009 10/04/2023 0759   PHURINE 5.0 10/04/2023 0759   GLUCOSEU NEGATIVE 10/04/2023 0759   HGBUR MODERATE (A) 10/04/2023 0759   BILIRUBINUR NEGATIVE 10/04/2023 0759   KETONESUR NEGATIVE 10/04/2023 0759   PROTEINUR 30 (A) 10/04/2023 0759   UROBILINOGEN 0.2 06/13/2014 0018   NITRITE NEGATIVE 10/04/2023 0759   LEUKOCYTESUR LARGE (A) 10/04/2023 0759   Sepsis Labs: @LABRCNTIP (procalcitonin:4,lacticidven:4) ) Recent Results (from the past 240 hours)  Resp panel by RT-PCR (RSV, Flu A&B, Covid) Anterior Nasal Swab     Status: Abnormal   Collection Time: 10/03/23  8:15 PM   Specimen: Anterior Nasal Swab  Result Value Ref Range Status   SARS Coronavirus 2 by RT PCR POSITIVE (A) NEGATIVE Final   Influenza A by PCR NEGATIVE NEGATIVE Final   Influenza B by PCR NEGATIVE NEGATIVE Final    Comment: (NOTE) The Xpert Xpress SARS-CoV-2/FLU/RSV plus assay is intended as an aid in the diagnosis of influenza from Nasopharyngeal swab specimens and should not be used as a sole basis for treatment. Nasal washings and aspirates are unacceptable for Xpert Xpress SARS-CoV-2/FLU/RSV testing.  Fact Sheet for Patients: BloggerCourse.com  Fact Sheet for Healthcare Providers: SeriousBroker.it  This test is not yet approved or cleared by the [place] FDA and has been authorized for detection and/or diagnosis of SARS-CoV-2 by FDA under an Emergency Use Authorization (EUA). This EUA will remain in effect (meaning this test can be used) for the duration of the COVID-19 declaration under Section 564(b)(1) of the Act, 21 U.S.C. section 360bbb-3(b)(1), unless the authorization is terminated or revoked.     Resp Syncytial Virus by PCR NEGATIVE NEGATIVE Final    Comment: (NOTE) Fact Sheet for Patients: BloggerCourse.com  Fact Sheet for Healthcare Providers: SeriousBroker.it  This test is  not yet approved or cleared by the [place] FDA and has been authorized for detection and/or diagnosis of SARS-CoV-2 by FDA under an Emergency Use Authorization (EUA). This EUA will remain in effect (meaning this test can be used) for the duration of the COVID-19 declaration under Section 564(b)(1) of the Act, 21 U.S.C. section 360bbb-3(b)(1), unless the authorization is terminated or revoked.  Performed at [facility], [address], [place], [place] [address]   Urine Culture (for pregnant, neutropenic or urologic patients or patients with an indwelling urinary catheter)  Status: Abnormal   Collection Time: 10/04/23 10:45 AM   Specimen: Urine, Clean Catch  Result Value Ref Range Status   Specimen Description URINE, CLEAN CATCH  Final   Special Requests   Final    NONE Performed at [facility], [address] [place], [place] [address]    Culture (A)  Final    >=100,000 COLONIES/mL ESCHERICHIA COLI Confirmed Extended Spectrum Beta-Lactamase Producer (ESBL).  In bloodstream infections from ESBL organisms, carbapenems are preferred over piperacillin/tazobactam. They are shown to have a lower risk of mortality.    Report Status 10/06/2023 FINAL  Final   Organism ID, Bacteria ESCHERICHIA COLI (A)  Final      Susceptibility   Escherichia coli - MIC*    AMPICILLIN >=32 RESISTANT Resistant     CEFAZOLIN >=64 RESISTANT Resistant     CEFEPIME 16 RESISTANT Resistant     CEFTRIAXONE >=64 RESISTANT Resistant     CIPROFLOXACIN >=4 RESISTANT Resistant     GENTAMICIN >=16 RESISTANT Resistant     IMIPENEM <=0.25 SENSITIVE Sensitive     NITROFURANTOIN <=16 SENSITIVE Sensitive     TRIMETH/SULFA <=20 SENSITIVE Sensitive     AMPICILLIN/SULBACTAM 4 SENSITIVE Sensitive     PIP/TAZO <=4 SENSITIVE Sensitive ug/mL    * >=100,000 COLONIES/mL ESCHERICHIA COLI  MRSA Next Gen by PCR, Nasal     Status: None   Collection Time: 10/04/23 12:24 PM   Specimen: Nasal Mucosa; Nasal Swab   Result Value Ref Range Status   MRSA by PCR Next Gen NOT DETECTED NOT DETECTED Final    Comment: (NOTE) The GeneXpert MRSA Assay (FDA approved for NASAL specimens only), is one component of a comprehensive MRSA colonization surveillance program. It is not intended to diagnose MRSA infection nor to guide or monitor treatment for MRSA infections. Test performance is not FDA approved in patients less than 73 years old. Performed at [facility], [address], [place], [place] [address]      Radiological Exams on Admission: DG Chest Portable 1 View Result Date: 10/12/2023 CLINICAL DATA:  Shortness of breath.  COVID last week. EXAM: PORTABLE CHEST 1 VIEW COMPARISON:  10/03/2023 FINDINGS: Stable cardiomediastinal silhouette. Aortic atherosclerotic calcification. Small bilateral pleural effusions. Bibasilar atelectasis. Diffuse interstitial coarsening. No pneumothorax. IMPRESSION: 1. Small bilateral pleural effusions with bibasilar atelectasis. 2. Diffuse interstitial coarsening may be due to edema or atypical infection. Electronically Signed   By: Minerva Fester M.D.   On: 10/12/2023 00:21    EKG: Independently reviewed.  Fib rate controlled.  Assessment/Plan Principal Problem:   Acute on chronic HFrEF (heart failure with reduced ejection fraction) (HCC) Active Problems:   Acute diastolic CHF (congestive heart failure) (HCC)   Essential hypertension   HLD (hyperlipidemia)   GERD (gastroesophageal reflux disease)   Paroxysmal atrial fibrillation (HCC)   Permanent atrial fibrillation (HCC)   Chronic kidney disease CKD IIIb   Normocytic anemia   Hx of BKA, left (HCC)    Acute on chronic HFpEF last EF measured was 60 to 65% on 5//24.  Patient received Lasix 40 mg IV in the ER.  Will keep patient on 20 mg IV every 12 hourly for 4 more doses.  Closely monitor intake output metabolic panel and daily weights. Paroxysmal atrial fibrillation rate controlled on beta-blockers and on  Eliquis for anticoagulation. Chronic anemia likely from renal disease follow CBC. Chronic kidney disease stage III creatinine at around baseline. Hyperlipidemia on Vytorin. GERD on PPI. History of left BKA.  Since  patient has acute CHF will need close monitoring further management and more than 2 midnight stay.   DVT prophylaxis: Eliquis. Code Status: DNR. Family Communication: Discussed with patient. Disposition Plan: Cardiac telemetry. Consults called: None. Admission status: Inpatient.

## 2023-10-12 NOTE — Progress Notes (Signed)
 Progress Note   Patient: Ana Franco ZOX:[id number] DOB: 1926-08-11 DOA: 10/11/2023     0 DOS: the patient was seen and examined on 10/12/2023   Brief hospital course: 89 year old woman with PMH of atrial fibrillation, diastolic CHF, chronic kidney disease stage III, chronic anemia, GERD, hyperlipidemia who was recently admitted to the hospital for AKI on CKD  with COVID infection and ESBL UTI.  Lasix was held during hospitalization and at discharge.  Patient presented from her facility with shortness of breath and diagnosed with CHF exacerbation.  Assessment and Plan:  CHF exacerbation (acute on chronic HFpEF) EF 60-65% on 12/14/2022. Patient was on 20 mg Lasix daily prior to recent admission, and was discharged home on Lasix 10 mg daily to be started on 10/14/23 Patient received IV lasix on presentation to the ED. - Continue to diurese with IV lasix.  - daily weights - Fluid restriction - Monitor renal function closely.   Paroxysmal A-fib -Continue home beta-blocker, Eliquis.  CKD stage III Baseline creatinine is approximately 1.1 Patient presented with creatinine of 1.06. -Will monitor creatinine.  Anemia of chronic disease -Will monitor hemoglobin.  Hyperlipidemia -Continue home medications.      Subjective: Patient states she is feeling better.   Physical Exam: Vitals:   10/12/23 0500 10/12/23 0600 10/12/23 0902 10/12/23 0903  BP: (!) 169/68 (!) 172/58  (!) 167/83  Pulse: 77 (!) 55  68  Resp: 19 14  (!) 26  Temp:   98 F (36.7 C)   TempSrc:   Oral   SpO2: 100% 94%  96%    General: Alert, oriented X3  Eyes: Pupils equal, reactive  Oral cavity: moist mucous membranes  Head: Atraumatic, normocephalic  Neck: supple  Chest: Bilateral basal crackles CVS: S1,S2 RRR. No murmurs  Abd: No distention, soft, non-tender. No masses palpable  Extr: No edema   MSK: Left BKA Neurological: Grossly intact.     Data Reviewed:   CBC    Component Value Date/Time   WBC  6.4 10/12/2023 0429   RBC 3.35 (L) 10/12/2023 0429   HGB 9.7 (L) 10/12/2023 0429   HGB 12.0 03/27/2021 1446   HCT 31.5 (L) 10/12/2023 0429   HCT 36.9 03/27/2021 1446   PLT 198 10/12/2023 0429   PLT 143 (L) 03/27/2021 1446   MCV 94.0 10/12/2023 0429   MCV 90 03/27/2021 1446   MCH 29.0 10/12/2023 0429   MCHC 30.8 10/12/2023 0429   RDW 13.8 10/12/2023 0429   RDW 12.6 03/27/2021 1446   LYMPHSABS 2.1 10/12/2023 0429   MONOABS 0.6 10/12/2023 0429   EOSABS 0.2 10/12/2023 0429   BASOSABS 0.0 10/12/2023 0429      Latest Ref Rng & Units 10/12/2023    4:29 AM 10/12/2023   12:10 AM 10/08/2023    6:54 AM  BMP  Glucose 70 - 99 mg/dL 811  97  91   BUN 8 - 23 mg/dL 11  11  19    Creatinine 0.44 - 1.00 mg/dL 9.14  7.82  9.56   Sodium 135 - 145 mmol/L 139  137  134   Potassium 3.5 - 5.1 mmol/L 3.9  4.1  4.0   Chloride 98 - 111 mmol/L 106  107  104   CO2 22 - 32 mmol/L 22  19  20    Calcium 8.9 - 10.3 mg/dL 9.0  8.8  8.6      Family Communication: n/a  Disposition: Status is: Inpatient Remains inpatient appropriate because: Receiving IV Lasix for treatment of  CHF exacerbation  Planned Discharge Destination: Skilled nursing facility DVT PPX: On systemic anticoagulation with Eliquis.    Time spent: 35 minutes  Morning admit No charge  Author: MDALA-GAUSI, Gwenette Greet, MD 10/12/2023 11:59 AM  For on call review [email or web address].

## 2023-10-12 NOTE — ED Notes (Signed)
 Water and applesauce given per pt request. Declined any other needs at this time.

## 2023-10-12 NOTE — ED Notes (Signed)
 Talked with attending Eduard Clos, MD regarding patient's bradycardia. Verbally ordered another EKG to be taken.

## 2023-10-12 NOTE — ED Provider Notes (Signed)
 [place] EMERGENCY DEPARTMENT AT [facility] Provider Note   CSN: [id number] Arrival date & time: 10/11/23  2353     History  Chief Complaint  Patient presents with   Shortness of Breath    Ana Franco is a 89 y.o. female.  The history is provided by the patient and the EMS personnel.  Shortness of Breath Severity:  Severe Onset quality:  Sudden Timing:  Constant Progression:  Unchanged Chronicity:  New Context: URI   Relieved by:  Nothing Worsened by:  Nothing Ineffective treatments:  None tried Associated symptoms: no chest pain, no fever and no wheezing   Associated symptoms comment:  Peripheral edema  Risk factors: no hx of PE/DVT, no prolonged immobilization and no recent surgery   Patient with AFIB on ELiquis with previous h/o covid presents with DOE,  Shoulder pain and worsening peripheral edema     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)    Chronic venous insufficiency    Depression    Difficulty in walking    Displaced trimalleolar fracture of left lower leg    Essential (primary) hypertension    GERD (gastroesophageal reflux disease)    History of blood transfusion    02/2018    History of falling    Hyperlipidemia    Hyperlipidemia    Hypertension    Infection and inflammatory reaction due to other internal orthopedic prosthetic devices, implants and grafts, subsequent encounter    Iron deficiency    Major depressive disorder    Muscle weakness (generalized)    Myocardial infarction (HCC)    nonstemi mi - 02/19/2018 at [facility] after ankle fracture    Nutritional deficiency, unspecified    Obesity    Other lack of coordination    Unspecified atrial fibrillation (HCC)    Unspecified osteoarthritis, unspecified site      Home Medications Prior to Admission medications   Medication Sig Start Date End Date  Taking? Authorizing Provider  Acetaminophen 325 MG CHEW Chew 650 mg by mouth every 6 (six) hours as needed (pain, fever, headache).    [provider]  apixaban (ELIQUIS) 5 MG TABS tablet Take 1 tablet (5 mg total) by mouth 2 (two) times daily. 01/09/21   Tyrone Nine, MD  beta carotene 78295 UNIT capsule Take 25,000 Units by mouth daily. 12/07/21   [provider]  CALCIUM+D3 500-10 MG-MCG TABS Take 1 tablet by mouth in the morning. 12/16/22   [provider]  citalopram (CELEXA) 20 MG tablet Take 10 mg by mouth daily.    [provider]  clotrimazole (LOTRIMIN) 1 % cream Apply 1 Application topically 2 (two) times daily. Apply to right foot 04/29/23   [provider]  esomeprazole (NEXIUM) 40 MG capsule Take 40 mg by mouth daily.     [provider]  ezetimibe-simvastatin (VYTORIN) 10-20 MG tablet Take 1 tablet by mouth every evening. 11/17/21   [provider]  furosemide (LASIX) 20 MG tablet Take 0.5 tablets (10 mg total) by mouth at bedtime. 10/14/23   Marinda Elk, MD  gabapentin (NEURONTIN) 100 MG capsule Take 100 mg by mouth 3 (three) times daily. 11/28/21   [provider]  metoprolol tartrate (LOPRESSOR) 25 MG tablet Take 1 tablet (25 mg total) by mouth 2 (two) times daily. Hold for SBP < 105 or  HR < 60 12/16/22   Danford, Earl Lites, MD  potassium chloride (KLOR-CON) 20 MEQ packet Take 20 mEq by mouth in the morning.    [provider]  vitamin B-12 (CYANOCOBALAMIN) 500 MCG tablet Take 500 mcg by mouth every morning.    [provider]      Allergies    Patient has no known allergies.    Review of Systems   Review of Systems  Constitutional:  Negative for fever.  HENT:  Negative for facial swelling.   Respiratory:  Positive for shortness of breath. Negative for wheezing and stridor.   Cardiovascular:  Negative for chest pain.  All other systems reviewed and are negative.   Physical  Exam Updated Vital Signs BP (!) 167/74 (BP Location: Right Arm)   Pulse 62   Temp 98.2 F (36.8 C) (Oral)   Resp 18   SpO2 100%  Physical Exam Vitals and nursing note reviewed.  Constitutional:      General: She is not in acute distress.    Appearance: She is well-developed.  HENT:     Head: Normocephalic and atraumatic.     Nose: Nose normal.  Eyes:     Pupils: Pupils are equal, round, and reactive to light.  Cardiovascular:     Rate and Rhythm: Normal rate and regular rhythm.     Pulses: Normal pulses.     Heart sounds: Normal heart sounds.  Pulmonary:     Effort: Pulmonary effort is normal. No respiratory distress.     Breath sounds: Rales present.  Abdominal:     General: Bowel sounds are normal. There is no distension.     Palpations: Abdomen is soft.     Tenderness: There is no abdominal tenderness. There is no guarding or rebound.  Musculoskeletal:        General: Normal range of motion.     Cervical back: Normal range of motion and neck supple.     Right lower leg: Edema present.  Skin:    General: Skin is warm and dry.     Capillary Refill: Capillary refill takes less than 2 seconds.     Findings: No erythema or rash.  Neurological:     General: No focal deficit present.     Deep Tendon Reflexes: Reflexes normal.  Psychiatric:        Mood and Affect: Mood normal.     ED Results / Procedures / Treatments   Labs (all labs ordered are listed, but only abnormal results are displayed) Results for orders placed or performed during the hospital encounter of 10/11/23  CBC with Differential   Collection Time: 10/12/23 12:10 AM  Result Value Ref Range   WBC 7.5 4.0 - 10.5 K/uL   RBC 3.18 (L) 3.87 - 5.11 MIL/uL   Hemoglobin 9.4 (L) 12.0 - 15.0 g/dL   HCT 40.9 (L) 81.1 - 91.4 %   MCV 94.7 80.0 - 100.0 fL   MCH 29.6 26.0 - 34.0 pg   MCHC 31.2 30.0 - 36.0 g/dL   RDW 78.2 95.6 - 21.3 %   Platelets 245 150 - 400 K/uL   nRBC 0.0 0.0 - 0.2 %   Neutrophils Relative  % 64 %   Neutro Abs 4.8 1.7 - 7.7 K/uL   Lymphocytes Relative 24 %   Lymphs Abs 1.8 0.7 - 4.0 K/uL   Monocytes Relative 10 %   Monocytes Absolute 0.8 0.1 - 1.0 K/uL   Eosinophils Relative 2 %   Eosinophils  Absolute 0.2 0.0 - 0.5 K/uL   Basophils Relative 0 %   Basophils Absolute 0.0 0.0 - 0.1 K/uL   Immature Granulocytes 0 %   Abs Immature Granulocytes 0.03 0.00 - 0.07 K/uL  Basic metabolic panel   Collection Time: 10/12/23 12:10 AM  Result Value Ref Range   Sodium 137 135 - 145 mmol/L   Potassium 4.1 3.5 - 5.1 mmol/L   Chloride 107 98 - 111 mmol/L   CO2 19 (L) 22 - 32 mmol/L   Glucose, Bld 97 70 - 99 mg/dL   BUN 11 8 - 23 mg/dL   Creatinine, Ser 0.86 (H) 0.44 - 1.00 mg/dL   Calcium 8.8 (L) 8.9 - 10.3 mg/dL   GFR, Estimated 51 (L) >60 mL/min   Anion gap 11 5 - 15  Brain natriuretic peptide   Collection Time: 10/12/23 12:10 AM  Result Value Ref Range   B Natriuretic Peptide 453.6 (H) 0.0 - 100.0 pg/mL  Troponin I (High Sensitivity)   Collection Time: 10/12/23 12:10 AM  Result Value Ref Range   Troponin I (High Sensitivity) 15 <18 ng/L   DG Chest Portable 1 View Result Date: 10/12/2023 CLINICAL DATA:  Shortness of breath.  COVID last week. EXAM: PORTABLE CHEST 1 VIEW COMPARISON:  10/03/2023 FINDINGS: Stable cardiomediastinal silhouette. Aortic atherosclerotic calcification. Small bilateral pleural effusions. Bibasilar atelectasis. Diffuse interstitial coarsening. No pneumothorax. IMPRESSION: 1. Small bilateral pleural effusions with bibasilar atelectasis. 2. Diffuse interstitial coarsening may be due to edema or atypical infection. Electronically Signed   By: Minerva Fester M.D.   On: 10/12/2023 00:21   DG Chest 2 View Result Date: 10/03/2023 CLINICAL DATA:  Generalized body aches, myalgia EXAM: CHEST - 2 VIEW COMPARISON:  12/14/2022 FINDINGS: Frontal and lateral views of the chest demonstrate stable enlargement the cardiac silhouette and dense calcification of the mitral annulus.  Chronic lung scarring greatest at the bases. No acute airspace disease, effusion, or pneumothorax. No acute bony abnormalities. IMPRESSION: 1. No acute intrathoracic process. Electronically Signed   By: Sharlet Salina M.D.   On: 10/03/2023 20:32    EKG EKG Interpretation Date/Time:  Saturday October 11 2023 23:59:14 EST Ventricular Rate:  66 PR Interval:    QRS Duration:  93 QT Interval:  454 QTC Calculation: 476 R Axis:   -39  Text Interpretation: Atrial fibrillation Confirmed by Peighton Edgin ([id number]) on 10/12/2023 12:05:11 AM  Radiology DG Chest Portable 1 View Result Date: 10/12/2023 CLINICAL DATA:  Shortness of breath.  COVID last week. EXAM: PORTABLE CHEST 1 VIEW COMPARISON:  10/03/2023 FINDINGS: Stable cardiomediastinal silhouette. Aortic atherosclerotic calcification. Small bilateral pleural effusions. Bibasilar atelectasis. Diffuse interstitial coarsening. No pneumothorax. IMPRESSION: 1. Small bilateral pleural effusions with bibasilar atelectasis. 2. Diffuse interstitial coarsening may be due to edema or atypical infection. Electronically Signed   By: Minerva Fester M.D.   On: 10/12/2023 00:21    Procedures Procedures    Medications Ordered in ED Medications  furosemide (LASIX) injection 40 mg (40 mg Intravenous Given 10/12/23 0115)    ED Course/ Medical Decision Making/ A&P                                 Medical Decision Making Patient with SOB and shoulder blade pain and edema.    Amount and/or Complexity of Data Reviewed Independent Historian: EMS    Details: See above  External Data Reviewed: labs, radiology, ECG and notes.    Details: Previous  admission reviewed  Labs: ordered.    Details: Troponin normal 15.  BNP elevated 453.6, normal white count 7.5. low hemoglobin 9.4, normal platelets.  Normal sodium 137, normal potassium 4.1, normal creatinine.   Radiology: ordered and independent interpretation performed.    Details: Chf by me  ECG/medicine tests: ordered  and independent interpretation performed. Decision-making details documented in ED Course.  Risk Prescription drug management. Decision regarding hospitalization.    Final Clinical Impression(s) / ED Diagnoses Final diagnoses:  None   The patient appears reasonably stabilized for admission considering the current resources, flow, and capabilities available in the ED at this time, and I doubt any other [facility] requiring further screening and/or treatment in the ED prior to admission.  Rx / DC Orders ED Discharge Orders     None         Madelina Sanda, MD 10/12/23 5027

## 2023-10-13 LAB — BASIC METABOLIC PANEL
Anion gap: 12 (ref 5–15)
BUN: 12 mg/dL (ref 8–23)
CO2: 23 mmol/L (ref 22–32)
Calcium: 8.8 mg/dL — ABNORMAL LOW (ref 8.9–10.3)
Chloride: 104 mmol/L (ref 98–111)
Creatinine, Ser: 1.08 mg/dL — ABNORMAL HIGH (ref 0.44–1.00)
GFR, Estimated: 47 mL/min — ABNORMAL LOW (ref >60)
Glucose, Bld: 92 mg/dL (ref 70–99)
Potassium: 3.9 mmol/L (ref 3.5–5.1)
Sodium: 139 mmol/L (ref 135–145)

## 2023-10-13 LAB — MAGNESIUM: Magnesium: 1.6 mg/dL — ABNORMAL LOW (ref 1.7–2.4)

## 2023-10-13 MED ORDER — FUROSEMIDE 10 MG/ML IJ SOLN
20.0000 mg | Freq: Once | INTRAMUSCULAR | Status: AC
  Administered 2023-10-13: 20 mg via INTRAVENOUS
  Filled 2023-10-13: qty 2

## 2023-10-13 MED ORDER — POTASSIUM CHLORIDE CRYS ER 20 MEQ PO TBCR
20.0000 meq | EXTENDED_RELEASE_TABLET | Freq: Every day | ORAL | Status: DC
  Administered 2023-10-13 – 2023-10-14 (×2): 20 meq via ORAL
  Filled 2023-10-13 (×2): qty 1

## 2023-10-13 MED ORDER — MAGNESIUM SULFATE 2 GM/50ML IV SOLN
2.0000 g | Freq: Once | INTRAVENOUS | Status: AC
  Administered 2023-10-13: 2 g via INTRAVENOUS
  Filled 2023-10-13: qty 50

## 2023-10-13 MED ORDER — FUROSEMIDE 20 MG PO TABS
20.0000 mg | ORAL_TABLET | Freq: Every day | ORAL | Status: DC
  Administered 2023-10-14: 20 mg via ORAL
  Filled 2023-10-13: qty 1

## 2023-10-13 NOTE — Progress Notes (Signed)
 Heart Failure Navigator Progress Note  Assessed for Heart & Vascular TOC clinic readiness.  Patient does not meet criteria due to EF 60-65%, has a scheduled [facility] appointment on 12/09/23. Will discharge back to [facility]. .   Navigator will sign off at this time.   Rhae Hammock, BSN, Scientist, clinical (histocompatibility and immunogenetics) Only

## 2023-10-13 NOTE — Plan of Care (Signed)

## 2023-10-13 NOTE — TOC Initial Note (Addendum)
 Transition of Care [facility]) - Initial/Assessment Note    Patient Details  Name: Ana Franco MRN: [id number] Date of Birth: September 23, 1925  Transition of [facility] CM/SW Contact:    Michaela Corner, LCSWA Phone Number: 10/13/2023, 10:41 AM  Clinical Narrative:  Patient is from [facility]. CSW called [facility] to discuss dc with admissions [facility]). CSW left a VM and awaiting a call back from [facility].       Will need to copy diet and med list from dc summary to [facility].   12:49 PM Per Janett Labella will need FL2 and DC summary at discharge.  TOC will continue to follow.   Expected Discharge Plan: Assisted Living Barriers to Discharge: Continued Medical Work up, Other (must enter comment)   Patient Goals and CMS Choice Patient states their goals for this hospitalization and ongoing recovery are:: Unable to assess          Expected Discharge Plan and Services In-house Referral: Clinical Social Work     Living arrangements for the past 2 months: Assisted Living Facility                                      Prior Living Arrangements/Services Living arrangements for the past 2 months: Assisted Living Facility Lives with:: Facility Resident Patient language and need for interpreter reviewed:: Yes Do you feel safe going back to the place where you live?: Yes      Need for Family Participation in Patient Care: Yes (Comment) Care giver support system in place?: Yes (comment)   Criminal Activity/Legal Involvement Pertinent to Current Situation/Hospitalization: No - Comment as needed  Activities of Daily Living   ADL Screening (condition at time of admission) Independently performs ADLs?: No Does the patient have a NEW difficulty with bathing/dressing/toileting/self-feeding that is expected to last >3 days?: No Does the patient have a NEW difficulty with getting in/out of bed, walking, or climbing stairs that is expected to last >3 days?: No Does the patient  have a NEW difficulty with communication that is expected to last >3 days?: No Is the patient deaf or have difficulty hearing?: No Does the patient have difficulty seeing, even when wearing glasses/contacts?: No Does the patient have difficulty concentrating, remembering, or making decisions?: No  Permission Sought/Granted Permission sought to share information with : Family Supports Permission granted to share information with : Yes, Verbal Permission Granted  Share Information with NAME: Brett Canales     Permission granted to share info w Relationship: Son     Emotional Assessment Appearance:: Appears stated age Attitude/Demeanor/Rapport: Unable to Assess Affect (typically observed): Unable to Assess Orientation: : Oriented to Self, Oriented to Place, Oriented to  Time, Oriented to Situation Alcohol / Substance Use: Not Applicable Psych Involvement: No (comment)  Admission diagnosis:  Acute right-sided congestive heart failure (HCC) [I50.811] Acute CHF (congestive heart failure) (HCC) [I50.9] Acute on chronic HFrEF (heart failure with reduced ejection fraction) (HCC) [I50.23] Patient Active Problem List   Diagnosis Date Noted   Acute on chronic HFrEF (heart failure with reduced ejection fraction) (HCC) 10/12/2023   Acute CHF (congestive heart failure) (HCC) 10/12/2023   ARF (acute renal failure) (HCC) 10/05/2023   SIRS (systemic inflammatory response syndrome) (HCC) 10/04/2023   Anemia of chronic disease 10/04/2023   COVID-19 virus antibody detected 10/04/2023   AKI (acute kidney injury) (HCC) 10/03/2023   Hypokalemia 12/15/2022   Acute diastolic CHF (congestive heart failure) (  HCC) 12/14/2022   Acute hyponatremia 12/14/2022   Normocytic anemia 12/14/2022   Coronary artery disease 12/14/2022   Hx of BKA, left (HCC) 12/14/2022   Permanent atrial fibrillation (HCC) 12/18/2021   H/O: CVA (cerebrovascular accident) 12/18/2021   History of non-ST elevation myocardial infarction  (NSTEMI) 12/18/2021   Acute CVA (cerebrovascular accident) (HCC) 01/07/2021   Thrombocytopenia (HCC) 01/06/2021   TIA (transient ischemic attack) 01/06/2021   DNR (do not resuscitate) 01/06/2021   Chronic kidney disease CKD IIIb 01/06/2021   Paroxysmal atrial fibrillation (HCC) 05/04/2018   Gangrene of lower extremity (HCC)    Subacute osteomyelitis, left ankle and foot (HCC)    S/P ORIF (open reduction internal fixation) fracture 04/03/2018   Wound infection 04/02/2018   MRSA (methicillin resistant staph aureus) culture positive 04/02/2018   Acute blood loss anemia 04/02/2018   Closed left ankle fracture, with delayed healing, subsequent encounter 03/31/2018   Essential hypertension 06/23/2014   HLD (hyperlipidemia) 06/23/2014   GERD (gastroesophageal reflux disease) 06/23/2014   UTI (urinary tract infection) 06/23/2014   Depression 06/23/2014   PCP:  Sherron Monday, MD Pharmacy:   [facility] [address] [place] [address] Phone: [phone] Fax: [phone]     Social Drivers of Health (SDOH) Social History: SDOH Screenings   Food Insecurity: No Food Insecurity (10/12/2023)  Housing: Low Risk  (10/12/2023)  Transportation Needs: No Transportation Needs (10/12/2023)  Utilities: Not At Risk (10/12/2023)  Social Connections: Moderately Isolated (10/12/2023)  Tobacco Use: Low Risk  (10/12/2023)   SDOH Interventions:     Readmission Risk Interventions     No data to display

## 2023-10-13 NOTE — Progress Notes (Addendum)
 Progress Note   Patient: Ana Franco GEX:[id number] DOB: 1926-08-02 DOA: 10/11/2023     1 DOS: the patient was seen and examined on 10/13/2023   Brief hospital course: 89 year old woman with PMH of atrial fibrillation, diastolic CHF, chronic kidney disease stage III, chronic anemia, GERD, hyperlipidemia who was recently admitted to the hospital for AKI on CKD  with COVID infection and ESBL UTI.  Lasix was held during hospitalization and at discharge.  Patient presented from her facility with shortness of breath and diagnosed with CHF exacerbation.  Assessment and Plan:  CHF exacerbation (acute on chronic HFpEF) EF 60-65% on 12/14/2022. Patient was on 20 mg Lasix daily prior to recent admission, and was discharged home on Lasix 10 mg daily to be started on 10/14/23 Patient received IV lasix on presentation to the ED. - Continue to diurese with IV lasix and transition to PO lasix in AM. Will resume prior dose of 20 mg daily.  - daily weights - Fluid restriction - Monitor renal function closely.   Paroxysmal A-fib -Continue home beta-blocker, Eliquis.  CKD stage III Baseline creatinine is approximately 1.1 Patient presented with creatinine of 1.06. -Will monitor creatinine.  Anemia of chronic disease -Will monitor hemoglobin.  Hyperlipidemia -Continue home medications.      Subjective: Patient says she was a little short of breath overnight.    Physical Exam: Vitals:   10/12/23 2304 10/13/23 0500 10/13/23 0503 10/13/23 0850  BP: (!) 162/65  (!) 159/49 (!) 165/64  Pulse: 61  (!) 57 66  Resp:      Temp: 98.1 F (36.7 C) 97.6 F (36.4 C)    TempSrc: Oral Oral    SpO2: 95%  90%   Weight:   80.7 kg   Height:        General: Alert, oriented X3  Eyes: Pupils equal, reactive  Oral cavity: moist mucous membranes  Head: Atraumatic, normocephalic  Neck: supple  Chest: Few crackles left base CVS: S1,S2 RRR. No murmurs  Abd: No distention, soft, non-tender. No masses palpable   Extr: No edema   MSK: Left BKA Neurological: Grossly intact.     Data Reviewed:   CBC    Component Value Date/Time   WBC 6.4 10/12/2023 0429   RBC 3.35 (L) 10/12/2023 0429   HGB 9.7 (L) 10/12/2023 0429   HGB 12.0 03/27/2021 1446   HCT 31.5 (L) 10/12/2023 0429   HCT 36.9 03/27/2021 1446   PLT 198 10/12/2023 0429   PLT 143 (L) 03/27/2021 1446   MCV 94.0 10/12/2023 0429   MCV 90 03/27/2021 1446   MCH 29.0 10/12/2023 0429   MCHC 30.8 10/12/2023 0429   RDW 13.8 10/12/2023 0429   RDW 12.6 03/27/2021 1446   LYMPHSABS 2.1 10/12/2023 0429   MONOABS 0.6 10/12/2023 0429   EOSABS 0.2 10/12/2023 0429   BASOSABS 0.0 10/12/2023 0429      Latest Ref Rng & Units 10/13/2023    3:13 AM 10/12/2023    4:29 AM 10/12/2023   12:10 AM  BMP  Glucose 70 - 99 mg/dL 92  010  97   BUN 8 - 23 mg/dL 12  11  11    Creatinine 0.44 - 1.00 mg/dL 2.72  5.36  6.44   Sodium 135 - 145 mmol/L 139  139  137   Potassium 3.5 - 5.1 mmol/L 3.9  3.9  4.1   Chloride 98 - 111 mmol/L 104  106  107   CO2 22 - 32 mmol/L 23  22  19   Calcium 8.9 - 10.3 mg/dL 8.8  9.0  8.8      Family Communication: I called and updated the patient's son.  Disposition: Status is: Inpatient Remains inpatient appropriate because: Receiving IV Lasix for treatment of CHF exacerbation  Planned Discharge Destination: Skilled nursing facility DVT PPX: On systemic anticoagulation with Eliquis.    Time spent: 35 minutes Author: Marcine Matar, MD 10/13/2023 3:39 PM  For on call review [email or web address].

## 2023-10-14 LAB — BASIC METABOLIC PANEL
Anion gap: 10 (ref 5–15)
BUN: 14 mg/dL (ref 8–23)
CO2: 25 mmol/L (ref 22–32)
Calcium: 8.5 mg/dL — ABNORMAL LOW (ref 8.9–10.3)
Chloride: 101 mmol/L (ref 98–111)
Creatinine, Ser: 1.14 mg/dL — ABNORMAL HIGH (ref 0.44–1.00)
GFR, Estimated: 44 mL/min — ABNORMAL LOW (ref >60)
Glucose, Bld: 93 mg/dL (ref 70–99)
Potassium: 3.9 mmol/L (ref 3.5–5.1)
Sodium: 136 mmol/L (ref 135–145)

## 2023-10-14 LAB — MAGNESIUM: Magnesium: 1.9 mg/dL (ref 1.7–2.4)

## 2023-10-14 MED ORDER — FUROSEMIDE 20 MG PO TABS: 20.0000 mg | ORAL_TABLET | Freq: Every day | ORAL | 2 refills | Status: AC

## 2023-10-14 MED ORDER — FUROSEMIDE 20 MG PO TABS: 20.0000 mg | ORAL_TABLET | Freq: Every day | ORAL | Status: DC

## 2023-10-14 NOTE — Discharge Summary (Addendum)
 Physician Discharge Summary   Patient: Ana Franco MRN: [id number] DOB: 07/18/1926  Admit date:     10/11/2023  Discharge date: 10/14/23  Discharge Physician: MDALA-GAUSI, Gwenette Greet   PCP: Sherron Monday, MD   Recommendations at discharge:    Resume daily lasix  Discharge Diagnoses: Principal Problem:   Acute on chronic HFrEF (heart failure with reduced ejection fraction) (HCC) Active Problems:   Acute diastolic CHF (congestive heart failure) (HCC)   Essential hypertension   HLD (hyperlipidemia)   GERD (gastroesophageal reflux disease)   Paroxysmal atrial fibrillation (HCC)   Permanent atrial fibrillation (HCC)   Chronic kidney disease CKD IIIb   Normocytic anemia   Hx of BKA, left (HCC)   Acute CHF (congestive heart failure) (HCC)  Resolved Problems:   * No resolved hospital problems. *  Hospital Course: 89 year old woman with PMH of atrial fibrillation, diastolic CHF, chronic kidney disease stage III, chronic anemia, GERD, hyperlipidemia who was recently admitted to the hospital for AKI on CKD with COVID infection and ESBL UTI. Lasix was held during hospitalization and at discharge. Patient presented from her facility with shortness of breath and diagnosed with CHF exacerbation.   The hospital course is in problem-based format below:    CHF exacerbation (acute on chronic HFpEF) EF 60-65% on 12/14/2022. Patient was on 20 mg Lasix daily prior to recent admission, and was discharged home on Lasix 10 mg daily to be started on 10/14/23. Patient received IV lasix on presentation to the ED and diurese was continued with IV lasix. Shortness of breath resolved and patient was transitioned to PO lasix at prior dose of lasix 20 mg daily.  Patient to continue with 20 mg daily PO lasix, and to continue with fluid restriction and daily weights.    Paroxysmal A-fib Home metoprolol and Eliquis were continued.    CKD stage III Baseline creatinine is approximately 1.1 Patient  presented with creatinine of 1.06. Creatinine remained stable and was 1.14 on the day of discharge.    Anemia of chronic disease Hemoglobin remained stable and was 9.7 at discharge.   Hyperlipidemia Home Vytorin was continued.   Consultants: n/a Procedures performed: n/a  Disposition: Assisted living Diet recommendation:  Discharge Diet Orders (From admission, onward)     Start     Ordered   10/14/23 0000  Diet - low sodium heart healthy        10/14/23 0843           Cardiac diet DISCHARGE MEDICATION: Allergies as of 10/14/2023   No Known Allergies      Medication List     TAKE these medications    Acetaminophen 325 MG Chew Chew 650 mg by mouth every 6 (six) hours as needed (pain, fever, headache).   apixaban 5 MG Tabs tablet Commonly known as: ELIQUIS Take 1 tablet (5 mg total) by mouth 2 (two) times daily.   beta carotene 78295 UNIT capsule Take 25,000 Units by mouth daily.   Calcium+D3 500-10 MG-MCG Tabs Generic drug: Calcium Carb-Cholecalciferol Take 1 tablet by mouth in the morning.   citalopram 20 MG tablet Commonly known as: CELEXA Take 10 mg by mouth daily.   clotrimazole 1 % cream Commonly known as: LOTRIMIN Apply 1 Application topically 2 (two) times daily. Apply to right foot   esomeprazole 40 MG capsule Commonly known as: NEXIUM Take 40 mg by mouth daily.   ezetimibe-simvastatin 10-20 MG tablet Commonly known as: VYTORIN Take 1 tablet by mouth every evening.   furosemide  20 MG tablet Commonly known as: LASIX Take 1 tablet (20 mg total) by mouth daily. What changed:  how much to take when to take this   gabapentin 100 MG capsule Commonly known as: NEURONTIN Take 100 mg by mouth 3 (three) times daily.   metoprolol tartrate 25 MG tablet Commonly known as: LOPRESSOR Take 1 tablet (25 mg total) by mouth 2 (two) times daily. Hold for SBP < 105 or HR < 60   potassium chloride 20 MEQ packet Commonly known as: KLOR-CON Take 20 mEq  by mouth in the morning.   vitamin B-12 500 MCG tablet Commonly known as: CYANOCOBALAMIN Take 500 mcg by mouth every morning.        Discharge Exam: Filed Weights   10/13/23 0503 10/14/23 0445  Weight: 80.7 kg 76.1 kg   Physical Exam on Day of Discharge   General: Alert, cheerful, oriented X3  Oral cavity: moist mucous membranes  Neck: supple  Chest: Few basal crackles CVS: S1,S2 RRR. No murmurs  Abd: No distention, soft, non-tender. No masses palpable  Extr: No edema. L BKA  Condition at discharge: stable  The results of significant diagnostics from this hospitalization (including imaging, microbiology, ancillary and laboratory) are listed below for reference.   Imaging Studies: DG Chest Portable 1 View Result Date: 10/12/2023 CLINICAL DATA:  Shortness of breath.  COVID last week. EXAM: PORTABLE CHEST 1 VIEW COMPARISON:  10/03/2023 FINDINGS: Stable cardiomediastinal silhouette. Aortic atherosclerotic calcification. Small bilateral pleural effusions. Bibasilar atelectasis. Diffuse interstitial coarsening. No pneumothorax. IMPRESSION: 1. Small bilateral pleural effusions with bibasilar atelectasis. 2. Diffuse interstitial coarsening may be due to edema or atypical infection. Electronically Signed   By: Minerva Fester M.D.   On: 10/12/2023 00:21   DG Chest 2 View Result Date: 10/03/2023 CLINICAL DATA:  Generalized body aches, myalgia EXAM: CHEST - 2 VIEW COMPARISON:  12/14/2022 FINDINGS: Frontal and lateral views of the chest demonstrate stable enlargement the cardiac silhouette and dense calcification of the mitral annulus. Chronic lung scarring greatest at the bases. No acute airspace disease, effusion, or pneumothorax. No acute bony abnormalities. IMPRESSION: 1. No acute intrathoracic process. Electronically Signed   By: Sharlet Salina M.D.   On: 10/03/2023 20:32    Microbiology: Results for orders placed or performed during the hospital encounter of 10/03/23  Resp panel by  RT-PCR (RSV, Flu A&B, Covid) Anterior Nasal Swab     Status: Abnormal   Collection Time: 10/03/23  8:15 PM   Specimen: Anterior Nasal Swab  Result Value Ref Range Status   SARS Coronavirus 2 by RT PCR POSITIVE (A) NEGATIVE Final   Influenza A by PCR NEGATIVE NEGATIVE Final   Influenza B by PCR NEGATIVE NEGATIVE Final    Comment: (NOTE) The Xpert Xpress SARS-CoV-2/FLU/RSV plus assay is intended as an aid in the diagnosis of influenza from Nasopharyngeal swab specimens and should not be used as a sole basis for treatment. Nasal washings and aspirates are unacceptable for Xpert Xpress SARS-CoV-2/FLU/RSV testing.  Fact Sheet for Patients: BloggerCourse.com  Fact Sheet for Healthcare Providers: SeriousBroker.it  This test is not yet approved or cleared by the [place] FDA and has been authorized for detection and/or diagnosis of SARS-CoV-2 by FDA under an Emergency Use Authorization (EUA). This EUA will remain in effect (meaning this test can be used) for the duration of the COVID-19 declaration under Section 564(b)(1) of the Act, 21 U.S.C. section 360bbb-3(b)(1), unless the authorization is terminated or revoked.     Resp Syncytial Virus by  PCR NEGATIVE NEGATIVE Final    Comment: (NOTE) Fact Sheet for Patients: BloggerCourse.com  Fact Sheet for Healthcare Providers: SeriousBroker.it  This test is not yet approved or cleared by the [place] FDA and has been authorized for detection and/or diagnosis of SARS-CoV-2 by FDA under an Emergency Use Authorization (EUA). This EUA will remain in effect (meaning this test can be used) for the duration of the COVID-19 declaration under Section 564(b)(1) of the Act, 21 U.S.C. section 360bbb-3(b)(1), unless the authorization is terminated or revoked.  Performed at [facility], [address], [place], [place] [address]    Urine Culture (for pregnant, neutropenic or urologic patients or patients with an indwelling urinary catheter)     Status: Abnormal   Collection Time: 10/04/23 10:45 AM   Specimen: Urine, Clean Catch  Result Value Ref Range Status   Specimen Description URINE, CLEAN CATCH  Final   Special Requests   Final    NONE Performed at [facility], [address] [place], [place] [address]    Culture (A)  Final    >=100,000 COLONIES/mL ESCHERICHIA COLI Confirmed Extended Spectrum Beta-Lactamase Producer (ESBL).  In bloodstream infections from ESBL organisms, carbapenems are preferred over piperacillin/tazobactam. They are shown to have a lower risk of mortality.    Report Status 10/06/2023 FINAL  Final   Organism ID, Bacteria ESCHERICHIA COLI (A)  Final      Susceptibility   Escherichia coli - MIC*    AMPICILLIN >=32 RESISTANT Resistant     CEFAZOLIN >=64 RESISTANT Resistant     CEFEPIME 16 RESISTANT Resistant     CEFTRIAXONE >=64 RESISTANT Resistant     CIPROFLOXACIN >=4 RESISTANT Resistant     GENTAMICIN >=16 RESISTANT Resistant     IMIPENEM <=0.25 SENSITIVE Sensitive     NITROFURANTOIN <=16 SENSITIVE Sensitive     TRIMETH/SULFA <=20 SENSITIVE Sensitive     AMPICILLIN/SULBACTAM 4 SENSITIVE Sensitive     PIP/TAZO <=4 SENSITIVE Sensitive ug/mL    * >=100,000 COLONIES/mL ESCHERICHIA COLI  MRSA Next Gen by PCR, Nasal     Status: None   Collection Time: 10/04/23 12:24 PM   Specimen: Nasal Mucosa; Nasal Swab  Result Value Ref Range Status   MRSA by PCR Next Gen NOT DETECTED NOT DETECTED Final    Comment: (NOTE) The GeneXpert MRSA Assay (FDA approved for NASAL specimens only), is one component of a comprehensive MRSA colonization surveillance program. It is not intended to diagnose MRSA infection nor to guide or monitor treatment for MRSA infections. Test performance is not FDA approved in patients less than 86 years old. Performed at [facility], [address]  [place], [place] [address]     Labs: CBC: Recent Labs  Lab 10/08/23 0654 10/12/23 0010 10/12/23 0429  WBC 9.1 7.5 6.4  NEUTROABS  --  4.8 3.6  HGB 10.4* 9.4* 9.7*  HCT 31.8* 30.1* 31.5*  MCV 91.9 94.7 94.0  PLT 209 245 198   Basic Metabolic Panel: Recent Labs  Lab 10/08/23 0654 10/12/23 0010 10/12/23 0429 10/13/23 0313 10/14/23 0352  NA 134* 137 139 139 136  K 4.0 4.1 3.9 3.9 3.9  CL 104 107 106 104 101  CO2 20* 19* 22 23 25   GLUCOSE 91 97 110* 92 93  BUN 19 11 11 12 14   CREATININE 1.25* 1.02* 1.06* 1.08* 1.14*  CALCIUM 8.6* 8.8* 9.0 8.8* 8.5*  MG  --   --  1.8 1.6* 1.9   Liver Function  Tests: Recent Labs  Lab 10/08/23 0654 10/12/23 0429  AST 26 30  ALT 13 12  ALKPHOS 54 57  BILITOT 0.5 0.6  PROT 6.0* 6.1*  ALBUMIN 2.6* 2.7*   CBG: No results for input(s): "GLUCAP" in the last 168 hours.  Discharge time spent: greater than 30 minutes.  Signed: MDALA-GAUSI, Gwenette Greet, MD [facility] 10/14/2023

## 2023-10-14 NOTE — TOC Transition Note (Signed)
 Transition of Care [facility]) - Discharge Note   Patient Details  Name: Ana Franco MRN: [id number] Date of Birth: Jan 07, 1926  [facility]) CM/SW Contact:  Michaela Corner, LCSWA Phone Number: 10/14/2023, 10:21 AM   Clinical Narrative:   Patient will DC to: [facility]  Anticipated DC date: 10/14/23  Family notified: Brett Canales (son)  Transport by: Sharin Mons    Per MD patient ready for DC to [facility]. RN to call report prior to discharge [phone]; room 6). RN, patient, patient's family, and facility notified of DC. Discharge Summary and FL2 sent to facility. DC packet on chart. Ambulance transport requested for patient.   CSW will sign off for now as social work intervention is no longer needed. Please consult [place] again if new needs arise.  Final next level of care: Skilled Nursing Facility Barriers to Discharge: Barriers Resolved   Patient Goals and CMS Choice Patient states their goals for this hospitalization and ongoing recovery are:: Unable to assess          Discharge Placement              Patient [facility] bed at: [facility] Patient to be transferred to facility by: [facility] Name of family member notified: Brett Canales Patient and family notified of of transfer: 10/14/23  Discharge Plan and Services Additional resources added to the After Visit Summary for   In-house Referral: Clinical Social Work                                   Social Drivers of Health (SDOH) Interventions SDOH Screenings   Food Insecurity: No Food Insecurity (10/12/2023)  Housing: Low Risk  (10/12/2023)  Transportation Needs: No Transportation Needs (10/12/2023)  Utilities: Not At Risk (10/12/2023)  Social Connections: Moderately Isolated (10/12/2023)  Tobacco Use: Low Risk  (10/12/2023)     Readmission Risk Interventions     No data to display

## 2023-10-14 NOTE — NC FL2 (Addendum)
 [place] MEDICAID FL2 LEVEL OF CARE FORM     IDENTIFICATION  Patient Name: Ana Franco Birthdate: 1926/06/16 Sex: female Admission Date (Current Location): 10/11/2023  [facility] and [place] Number:  Producer, television/film/video and Address:  [facility] [place], [address], [place], [place] [address]      Provider Number: [id number]  Attending Physician Name and Address:  Mdala-Gausi, Masiku Agat*  Relative Name and Phone Number:       Current Level of Care: Hospital Recommended Level of Care: Assisted Living Facility Prior Approval Number:    Date Approved/Denied:   PASRR Number: [id number]  Discharge Plan: Other (Comment) (ALF)    Current Diagnoses: Patient Active Problem List   Diagnosis Date Noted   Acute on chronic HFrEF (heart failure with reduced ejection fraction) (HCC) 10/12/2023   Acute CHF (congestive heart failure) (HCC) 10/12/2023   ARF (acute renal failure) (HCC) 10/05/2023   SIRS (systemic inflammatory response syndrome) (HCC) 10/04/2023   Anemia of chronic disease 10/04/2023   COVID-19 virus antibody detected 10/04/2023   AKI (acute kidney injury) (HCC) 10/03/2023   Hypokalemia 12/15/2022   Acute diastolic CHF (congestive heart failure) (HCC) 12/14/2022   Acute hyponatremia 12/14/2022   Normocytic anemia 12/14/2022   Coronary artery disease 12/14/2022   Hx of BKA, left (HCC) 12/14/2022   Permanent atrial fibrillation (HCC) 12/18/2021   H/O: CVA (cerebrovascular accident) 12/18/2021   History of non-ST elevation myocardial infarction (NSTEMI) 12/18/2021   Acute CVA (cerebrovascular accident) (HCC) 01/07/2021   Thrombocytopenia (HCC) 01/06/2021   TIA (transient ischemic attack) 01/06/2021   DNR (do not resuscitate) 01/06/2021   Chronic kidney disease CKD IIIb 01/06/2021   Paroxysmal atrial fibrillation (HCC) 05/04/2018   Gangrene of lower extremity (HCC)    Subacute osteomyelitis, left ankle and foot (HCC)    S/P ORIF (open  reduction internal fixation) fracture 04/03/2018   Wound infection 04/02/2018   MRSA (methicillin resistant staph aureus) culture positive 04/02/2018   Acute blood loss anemia 04/02/2018   Closed left ankle fracture, with delayed healing, subsequent encounter 03/31/2018   Essential hypertension 06/23/2014   HLD (hyperlipidemia) 06/23/2014   GERD (gastroesophageal reflux disease) 06/23/2014   UTI (urinary tract infection) 06/23/2014   Depression 06/23/2014    Orientation RESPIRATION BLADDER Height & Weight     Self, Time, Situation, Place  Normal Incontinent, External catheter Weight: 167 lb 12.3 oz (76.1 kg) Height:  5\' 8"  (172.7 cm)  BEHAVIORAL SYMPTOMS/MOOD NEUROLOGICAL BOWEL NUTRITION STATUS      Incontinent Diet (low sodium heart healthy)  AMBULATORY STATUS COMMUNICATION OF NEEDS Skin   Extensive Assist Verbally Normal                       Personal Care Assistance Level of Assistance  Bathing, Dressing, Feeding Bathing Assistance: Limited assistance Feeding assistance: Limited assistance Dressing Assistance: Limited assistance     Functional Limitations Info  Hearing, Sight, Speech Sight Info: Adequate Hearing Info: Adequate Speech Info: Adequate    SPECIAL CARE FACTORS FREQUENCY  PT (By licensed PT), OT (By licensed OT)     PT Frequency: 1x week OT Frequency: 1x week            Contractures Contractures Info: Not present    Additional Factors Info  Code Status, Allergies Code Status Info: DNR limited Allergies Info: NKA      Isolation Precautions Info: Contact pre, ESBL       TAKE these medications  Acetaminophen 325 MG Chew Chew 650 mg by mouth every 6 (six) hours as needed (pain, fever, headache).    apixaban 5 MG Tabs tablet Commonly known as: ELIQUIS Take 1 tablet (5 mg total) by mouth 2 (two) times daily.    beta carotene 40981 UNIT capsule Take 25,000 Units by mouth daily.    Calcium+D3 500-10 MG-MCG Tabs Generic drug:  Calcium Carb-Cholecalciferol Take 1 tablet by mouth in the morning.    citalopram 20 MG tablet Commonly known as: CELEXA Take 10 mg by mouth daily.    clotrimazole 1 % cream Commonly known as: LOTRIMIN Apply 1 Application topically 2 (two) times daily. Apply to right foot    esomeprazole 40 MG capsule Commonly known as: NEXIUM Take 40 mg by mouth daily.    ezetimibe-simvastatin 10-20 MG tablet Commonly known as: VYTORIN Take 1 tablet by mouth every evening.    furosemide 20 MG tablet Commonly known as: LASIX Take 1 tablet (20 mg total) by mouth daily. What changed:  how much to take when to take this    gabapentin 100 MG capsule Commonly known as: NEURONTIN Take 100 mg by mouth 3 (three) times daily.    metoprolol tartrate 25 MG tablet Commonly known as: LOPRESSOR Take 1 tablet (25 mg total) by mouth 2 (two) times daily. Hold for SBP < 105 or HR < 60    potassium chloride 20 MEQ packet Commonly known as: KLOR-CON Take 20 mEq by mouth in the morning.    vitamin B-12 500 MCG tablet Commonly known as: CYANOCOBALAMIN Take 500 mcg by mouth every morning.    Relevant Imaging Results:  Relevant Lab Results:   Additional Information SSN-[id number]  Michaela Corner, [place]

## 2023-10-14 NOTE — Plan of Care (Signed)
  Problem: Education: Goal: Knowledge of risk factors and measures for prevention of condition will improve Outcome: Progressing   Problem: Coping: Goal: Psychosocial and spiritual needs will be supported Outcome: Progressing   Problem: Education: Goal: Knowledge of General Education information will improve Description: Including pain rating scale, medication(s)/side effects and non-pharmacologic comfort measures Outcome: Progressing   

## 2023-10-21 ENCOUNTER — Emergency Department (HOSPITAL_COMMUNITY)

## 2023-10-21 ENCOUNTER — Other Ambulatory Visit: Payer: Self-pay

## 2023-10-21 ENCOUNTER — Inpatient Hospital Stay (HOSPITAL_COMMUNITY)
Admission: EM | Admit: 2023-10-21 | Discharge: 2023-10-23 | DRG: 872 | Disposition: A | Discharge status: Skilled Nursing Facility | Source: Skilled Nursing Facility | Attending: Internal Medicine | Admitting: Internal Medicine

## 2023-10-21 ENCOUNTER — Encounter (HOSPITAL_COMMUNITY): Payer: Self-pay

## 2023-10-21 ENCOUNTER — Observation Stay (HOSPITAL_COMMUNITY)

## 2023-10-21 DIAGNOSIS — D519 Vitamin B12 deficiency anemia, unspecified: Secondary | ICD-10-CM | POA: Diagnosis present

## 2023-10-21 DIAGNOSIS — Z7901 Long term (current) use of anticoagulants: Secondary | ICD-10-CM

## 2023-10-21 DIAGNOSIS — E875 Hyperkalemia: Secondary | ICD-10-CM | POA: Diagnosis not present

## 2023-10-21 DIAGNOSIS — B9620 Unspecified Escherichia coli [E. coli] as the cause of diseases classified elsewhere: Secondary | ICD-10-CM | POA: Diagnosis present

## 2023-10-21 DIAGNOSIS — I130 Hypertensive heart and chronic kidney disease with heart failure and stage 1 through stage 4 chronic kidney disease, or unspecified chronic kidney disease: Secondary | ICD-10-CM | POA: Diagnosis present

## 2023-10-21 DIAGNOSIS — I252 Old myocardial infarction: Secondary | ICD-10-CM

## 2023-10-21 DIAGNOSIS — A419 Sepsis, unspecified organism: Secondary | ICD-10-CM | POA: Diagnosis not present

## 2023-10-21 DIAGNOSIS — E785 Hyperlipidemia, unspecified: Secondary | ICD-10-CM | POA: Diagnosis present

## 2023-10-21 DIAGNOSIS — K920 Hematemesis: Secondary | ICD-10-CM | POA: Diagnosis not present

## 2023-10-21 DIAGNOSIS — R5381 Other malaise: Secondary | ICD-10-CM | POA: Diagnosis present

## 2023-10-21 DIAGNOSIS — Z89512 Acquired absence of left leg below knee: Secondary | ICD-10-CM

## 2023-10-21 DIAGNOSIS — Z79899 Other long term (current) drug therapy: Secondary | ICD-10-CM

## 2023-10-21 DIAGNOSIS — N179 Acute kidney failure, unspecified: Principal | ICD-10-CM | POA: Diagnosis present

## 2023-10-21 DIAGNOSIS — Z66 Do not resuscitate: Secondary | ICD-10-CM | POA: Diagnosis present

## 2023-10-21 DIAGNOSIS — Z8673 Personal history of transient ischemic attack (TIA), and cerebral infarction without residual deficits: Secondary | ICD-10-CM

## 2023-10-21 DIAGNOSIS — N390 Urinary tract infection, site not specified: Secondary | ICD-10-CM

## 2023-10-21 DIAGNOSIS — I2510 Atherosclerotic heart disease of native coronary artery without angina pectoris: Secondary | ICD-10-CM | POA: Diagnosis present

## 2023-10-21 DIAGNOSIS — I4821 Permanent atrial fibrillation: Secondary | ICD-10-CM | POA: Diagnosis present

## 2023-10-21 DIAGNOSIS — E871 Hypo-osmolality and hyponatremia: Secondary | ICD-10-CM | POA: Diagnosis present

## 2023-10-21 DIAGNOSIS — F32A Depression, unspecified: Secondary | ICD-10-CM | POA: Diagnosis present

## 2023-10-21 DIAGNOSIS — R0902 Hypoxemia: Secondary | ICD-10-CM | POA: Diagnosis present

## 2023-10-21 DIAGNOSIS — Z1152 Encounter for screening for COVID-19: Secondary | ICD-10-CM

## 2023-10-21 DIAGNOSIS — I5032 Chronic diastolic (congestive) heart failure: Secondary | ICD-10-CM | POA: Diagnosis present

## 2023-10-21 DIAGNOSIS — R739 Hyperglycemia, unspecified: Secondary | ICD-10-CM | POA: Diagnosis present

## 2023-10-21 DIAGNOSIS — E861 Hypovolemia: Secondary | ICD-10-CM | POA: Diagnosis present

## 2023-10-21 DIAGNOSIS — N1831 Chronic kidney disease, stage 3a: Secondary | ICD-10-CM | POA: Diagnosis present

## 2023-10-21 DIAGNOSIS — N136 Pyonephrosis: Secondary | ICD-10-CM | POA: Diagnosis present

## 2023-10-21 DIAGNOSIS — K219 Gastro-esophageal reflux disease without esophagitis: Secondary | ICD-10-CM | POA: Diagnosis present

## 2023-10-21 LAB — URINALYSIS, W/ REFLEX TO CULTURE (INFECTION SUSPECTED)
Bilirubin Urine: NEGATIVE
Glucose, UA: NEGATIVE mg/dL
Ketones, ur: NEGATIVE mg/dL
Nitrite: NEGATIVE
Protein, ur: 100 mg/dL — AB
RBC / HPF: >50 RBC/hpf (ref 0–5)
Specific Gravity, Urine: 1.008 (ref 1.005–1.030)
WBC, UA: >50 WBC/hpf (ref 0–5)
pH: 7 (ref 5.0–8.0)

## 2023-10-21 LAB — COMPREHENSIVE METABOLIC PANEL
ALT: 15 U/L (ref 0–44)
AST: 40 U/L (ref 15–41)
Albumin: 3 g/dL — ABNORMAL LOW (ref 3.5–5.0)
Alkaline Phosphatase: 57 U/L (ref 38–126)
Anion gap: 14 (ref 5–15)
BUN: 21 mg/dL (ref 8–23)
CO2: 21 mmol/L — ABNORMAL LOW (ref 22–32)
Calcium: 8.4 mg/dL — ABNORMAL LOW (ref 8.9–10.3)
Chloride: 96 mmol/L — ABNORMAL LOW (ref 98–111)
Creatinine, Ser: 1.8 mg/dL — ABNORMAL HIGH (ref 0.44–1.00)
GFR, Estimated: 26 mL/min — ABNORMAL LOW (ref >60)
Glucose, Bld: 107 mg/dL — ABNORMAL HIGH (ref 70–99)
Potassium: 5.7 mmol/L — ABNORMAL HIGH (ref 3.5–5.1)
Sodium: 131 mmol/L — ABNORMAL LOW (ref 135–145)
Total Bilirubin: 1.4 mg/dL — ABNORMAL HIGH (ref 0.0–1.2)
Total Protein: 6.3 g/dL — ABNORMAL LOW (ref 6.5–8.1)

## 2023-10-21 LAB — TROPONIN I (HIGH SENSITIVITY)
Troponin I (High Sensitivity): 16 ng/L (ref <18)
Troponin I (High Sensitivity): 16 ng/L (ref <18)

## 2023-10-21 LAB — RESP PANEL BY RT-PCR (RSV, FLU A&B, COVID)  RVPGX2
Influenza A by PCR: NEGATIVE
Influenza B by PCR: NEGATIVE
Resp Syncytial Virus by PCR: NEGATIVE
SARS Coronavirus 2 by RT PCR: NEGATIVE

## 2023-10-21 LAB — CBC WITH DIFFERENTIAL/PLATELET
Abs Immature Granulocytes: 0.03 10*3/uL (ref 0.00–0.07)
Basophils Absolute: 0 10*3/uL (ref 0.0–0.1)
Basophils Relative: 0 %
Eosinophils Absolute: 0 10*3/uL (ref 0.0–0.5)
Eosinophils Relative: 0 %
HCT: 31 % — ABNORMAL LOW (ref 36.0–46.0)
Hemoglobin: 9.9 g/dL — ABNORMAL LOW (ref 12.0–15.0)
Immature Granulocytes: 0 %
Lymphocytes Relative: 8 %
Lymphs Abs: 1.1 10*3/uL (ref 0.7–4.0)
MCH: 29.6 pg (ref 26.0–34.0)
MCHC: 31.9 g/dL (ref 30.0–36.0)
MCV: 92.5 fL (ref 80.0–100.0)
Monocytes Absolute: 1.3 10*3/uL — ABNORMAL HIGH (ref 0.1–1.0)
Monocytes Relative: 10 %
Neutro Abs: 10.7 10*3/uL — ABNORMAL HIGH (ref 1.7–7.7)
Neutrophils Relative %: 82 %
Platelets: 180 10*3/uL (ref 150–400)
RBC: 3.35 MIL/uL — ABNORMAL LOW (ref 3.87–5.11)
RDW: 14.2 % (ref 11.5–15.5)
WBC: 13.1 10*3/uL — ABNORMAL HIGH (ref 4.0–10.5)
nRBC: 0 % (ref 0.0–0.2)

## 2023-10-21 LAB — APTT: aPTT: 35 s (ref 24–36)

## 2023-10-21 LAB — BRAIN NATRIURETIC PEPTIDE: B Natriuretic Peptide: 485.9 pg/mL — ABNORMAL HIGH (ref 0.0–100.0)

## 2023-10-21 LAB — I-STAT CG4 LACTIC ACID, ED: Lactic Acid, Venous: 1.5 mmol/L (ref 0.5–1.9)

## 2023-10-21 LAB — LIPASE, BLOOD: Lipase: 44 U/L (ref 11–51)

## 2023-10-21 LAB — PROTIME-INR
INR: 1.5 — ABNORMAL HIGH (ref 0.8–1.2)
Prothrombin Time: 18.3 s — ABNORMAL HIGH (ref 11.4–15.2)

## 2023-10-21 MED ORDER — EZETIMIBE 10 MG PO TABS
10.0000 mg | ORAL_TABLET | Freq: Every day | ORAL | Status: DC
  Administered 2023-10-22 – 2023-10-23 (×2): 10 mg via ORAL
  Filled 2023-10-21 (×2): qty 1

## 2023-10-21 MED ORDER — EZETIMIBE-SIMVASTATIN 10-20 MG PO TABS
1.0000 | ORAL_TABLET | Freq: Every evening | ORAL | Status: DC
  Filled 2023-10-21: qty 1

## 2023-10-21 MED ORDER — SIMVASTATIN 20 MG PO TABS
20.0000 mg | ORAL_TABLET | Freq: Every day | ORAL | Status: DC
  Administered 2023-10-22 – 2023-10-23 (×2): 20 mg via ORAL
  Filled 2023-10-21 (×2): qty 1

## 2023-10-21 MED ORDER — SODIUM CHLORIDE 0.9 % IV SOLN
1.0000 g | Freq: Once | INTRAVENOUS | Status: AC
  Administered 2023-10-21: 1 g via INTRAVENOUS
  Filled 2023-10-21: qty 10

## 2023-10-21 MED ORDER — ACETAMINOPHEN 650 MG RE SUPP: 650.0000 mg | Freq: Four times a day (QID) | RECTAL | Status: DC | PRN

## 2023-10-21 MED ORDER — SODIUM CHLORIDE 0.9 % IV BOLUS
500.0000 mL | Freq: Once | INTRAVENOUS | Status: AC
  Administered 2023-10-21: 500 mL via INTRAVENOUS

## 2023-10-21 MED ORDER — ACETAMINOPHEN 325 MG PO TABS
650.0000 mg | ORAL_TABLET | Freq: Four times a day (QID) | ORAL | Status: DC | PRN
  Administered 2023-10-21 – 2023-10-23 (×3): 650 mg via ORAL
  Filled 2023-10-21 (×4): qty 2

## 2023-10-21 MED ORDER — CITALOPRAM HYDROBROMIDE 20 MG PO TABS
10.0000 mg | ORAL_TABLET | Freq: Every day | ORAL | Status: DC
  Administered 2023-10-23: 10 mg via ORAL
  Filled 2023-10-21 (×2): qty 1

## 2023-10-21 MED ORDER — ACETAMINOPHEN 325 MG PO TABS
650.0000 mg | ORAL_TABLET | Freq: Four times a day (QID) | ORAL | Status: DC | PRN
  Administered 2023-10-21: 650 mg via ORAL
  Filled 2023-10-21: qty 2

## 2023-10-21 MED ORDER — SODIUM ZIRCONIUM CYCLOSILICATE 10 G PO PACK
10.0000 g | PACK | Freq: Once | ORAL | Status: AC
  Administered 2023-10-21: 10 g via ORAL
  Filled 2023-10-21: qty 1

## 2023-10-21 MED ORDER — ONDANSETRON HCL 4 MG PO TABS: 4.0000 mg | ORAL_TABLET | Freq: Four times a day (QID) | ORAL | Status: DC | PRN

## 2023-10-21 MED ORDER — PANTOPRAZOLE SODIUM 40 MG IV SOLR
40.0000 mg | Freq: Two times a day (BID) | INTRAVENOUS | Status: DC
  Administered 2023-10-21 – 2023-10-23 (×4): 40 mg via INTRAVENOUS
  Filled 2023-10-21 (×4): qty 10

## 2023-10-21 MED ORDER — ONDANSETRON HCL 4 MG/2ML IJ SOLN: 4.0000 mg | Freq: Four times a day (QID) | INTRAMUSCULAR | Status: DC | PRN

## 2023-10-21 MED ORDER — SODIUM CHLORIDE 0.9 % IV SOLN
500.0000 mg | Freq: Two times a day (BID) | INTRAVENOUS | Status: DC
  Administered 2023-10-21 – 2023-10-23 (×4): 500 mg via INTRAVENOUS
  Filled 2023-10-21 (×5): qty 10

## 2023-10-21 NOTE — H&P (Signed)
 History and Physical    Patient: Ana Franco ZOX:[id number] DOB: 20-Oct-1925 DOA: 10/21/2023 DOS: the patient was seen and examined on 10/21/2023 PCP: Sherron Monday, MD  Patient coming from: Nursing facility  Chief Complaint:  Chief Complaint  Patient presents with   Fever   Hematemesis   HPI: Ana Franco is a 89 y.o. female with medical history significant of chronic diastolic heart failure, CKD 3B, CAD, history of CVA, hypertension, GERD, history of left BKA, hyperlipidemia, permanent atrial fibrillation, normocytic anemia presenting to the ED after an episode of hematemesis.  Patient reports that she was in her usual state of health until earlier today.  She had a large breakfast consisting of eggs, bacon, toast, jelly and after finishing she experienced an episode of hematemesis.  States that her vomit was dark in color.  Given this, EMS was called who arrived on scene and noted that she had low oxygen.  She was placed on 2 L nasal cannula and brought to the ED.  Patient denies any fevers, chills, chest pain, palpitations, shortness of breath, cough, abdominal pain, urinary changes.  Patient does report a history of urinary difficulties after her last pregnancy.  Says that she has a history of prolapse following her last pregnancy and sometimes feels increased pressure when trying to urinate.  However, she notes that aside from intermittent dysuria, she feels otherwise fine.  ED course: Vital signs notable for fever to 101.54F, otherwise initial vital signs stable.  Blood pressures have trended downwards, now in the 90s-110s/50s.  CBC with leukocytosis to 13.1, hemoglobin 9.9 (baseline around 9.5-10).  CMP with mild hyponatremia, hyperkalemia to 5.7, mild hyperglycemia, creatinine 1.8 (baseline around 1-1.1), BUN 21.  PT 18.3, INR 1.5.  Respiratory panel negative for COVID, flu, RSV.  BNP 485.  Troponins flat at 16 x 2. EKG with atrial fibrillation and ventricular bigeminy noted.  Lactate 1.5.  Urinalysis with hematuria, moderate leukocytes, greater than 50 WBCs, many bacteria (though does have 6-10 squamous epithelial cells).  Chest x-ray showing likely left pleural effusion with associated atelectasis, though left basilar pneumonia unable to be excluded.  Patient started on IV Rocephin empirically for UTI.  Patient also given 500 cc of normal saline by ED provider.  [facility] hospitalist asked to evaluate patient for admission.   Review of Systems: As mentioned in the history of present illness. All other systems reviewed and are negative. 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)    Chronic venous insufficiency    Depression    Difficulty in walking    Displaced trimalleolar fracture of left lower leg    Essential (primary) hypertension    GERD (gastroesophageal reflux disease)    History of blood transfusion    02/2018    History of falling    Hyperlipidemia    Hyperlipidemia    Hypertension    Infection and inflammatory reaction due to other internal orthopedic prosthetic devices, implants and grafts, subsequent encounter    Iron deficiency    Major depressive disorder    Muscle weakness (generalized)    Myocardial infarction (HCC)    nonstemi mi - 02/19/2018 at [facility] after ankle fracture    Nutritional deficiency, unspecified    Obesity    Other lack of coordination    Unspecified atrial fibrillation (HCC)    Unspecified osteoarthritis, unspecified site  Past Surgical History:  Procedure Laterality Date   AMPUTATION Left 04/10/2018   Procedure: LEFT BELOW KNEE AMPUTATION;  Surgeon: Nadara Mustard, MD;  Location: [facility] OR;  Service: Orthopedics;  Laterality: Left;   APPENDECTOMY     APPLICATION OF WOUND VAC Left 03/31/2018   Procedure: APPLICATION OF WOUND VAC;  Surgeon: Sheral Apley, MD;  Location: [facility] ORS;  Service:  Orthopedics;  Laterality: Left;   HARDWARE REMOVAL Left 03/31/2018   Procedure: HARDWARE REMOVAL;  Surgeon: Sheral Apley, MD;  Location: [facility] ORS;  Service: Orthopedics;  Laterality: Left;   IRRIGATION AND DEBRIDEMENT FOOT Left 03/31/2018   Procedure: IRRIGATION AND DEBRIDEMENT OF LEFT ANKLE;  Surgeon: Sheral Apley, MD;  Location: [facility] ORS;  Service: Orthopedics;  Laterality: Left;   ORIF ANKLE FRACTURE Right 06/13/2014   Procedure: OPEN REDUCTION INTERNAL FIXATION (ORIF) BIMALLEOLAR ANKLE FRACTURE;  Surgeon: Loreta Ave, MD;  Location: [facility] OR;  Service: Orthopedics;  Laterality: Right;   Social History:  reports that she has never smoked. She has never used smokeless tobacco. She reports that she does not drink alcohol and does not use drugs.  No Known Allergies  History reviewed. No pertinent family history.  Prior to Admission medications   Medication Sig Start Date End Date Taking? Authorizing Provider  Acetaminophen 325 MG CHEW Chew 650 mg by mouth every 6 (six) hours as needed (pain, fever, headache).   Yes [provider]  apixaban (ELIQUIS) 5 MG TABS tablet Take 1 tablet (5 mg total) by mouth 2 (two) times daily. 01/09/21  Yes Tyrone Nine, MD  beta carotene 40981 UNIT capsule Take 25,000 Units by mouth daily. 12/07/21  Yes [provider]  CALCIUM+D3 500-10 MG-MCG TABS Take 1 tablet by mouth in the morning. 12/16/22  Yes [provider]  citalopram (CELEXA) 20 MG tablet Take 10 mg by mouth daily.   Yes [provider]  clotrimazole (LOTRIMIN) 1 % cream Apply 1 Application topically 2 (two) times daily. Apply to right foot 04/29/23  Yes [provider]  esomeprazole (NEXIUM) 40 MG capsule Take 40 mg by mouth daily.    Yes [provider]  ezetimibe-simvastatin (VYTORIN) 10-20 MG tablet Take 1 tablet by mouth every evening. 11/17/21  Yes [provider]  furosemide (LASIX) 20 MG tablet Take 1 tablet (20 mg total) by mouth  daily. 10/14/23  Yes Mdala-Gausi, Gwenette Greet, MD  gabapentin (NEURONTIN) 100 MG capsule Take 100 mg by mouth 3 (three) times daily. 11/28/21  Yes [provider]  metoprolol tartrate (LOPRESSOR) 25 MG tablet Take 1 tablet (25 mg total) by mouth 2 (two) times daily. Hold for SBP < 105 or HR < 60 12/16/22  Yes Danford, Earl Lites, MD  potassium chloride (KLOR-CON) 20 MEQ packet Take 20 mEq by mouth in the morning.   Yes [provider]  vitamin B-12 (CYANOCOBALAMIN) 500 MCG tablet Take 500 mcg by mouth every morning.   Yes [provider]    Physical Exam: Vitals:   10/21/23 1600 10/21/23 1700 10/21/23 1800 10/21/23 1827  BP: (!) 126/50 (!) 99/53 (!) 110/52   Pulse: 87 82 80   Resp: 18 (!) 22 16   Temp:    99 F (37.2 C)  TempSrc:    Oral  SpO2: 94% 93% 97%   Weight:      Height:       Physical Exam Constitutional:      Appearance: Normal appearance. She is not ill-appearing.  HENT:     Head: Normocephalic and atraumatic.     Mouth/Throat:     Mouth: Mucous membranes are dry.     Pharynx: Oropharynx is clear. No posterior oropharyngeal erythema.  Eyes:     General: No scleral icterus.    Extraocular Movements: Extraocular movements intact.     Pupils: Pupils are equal, round, and reactive to light.  Cardiovascular:     Rate and Rhythm: Normal rate. Rhythm irregular.     Heart sounds: Normal heart sounds. No murmur heard.    No friction rub. No gallop.     Comments: No obvious JVD. Pulmonary:     Effort: Pulmonary effort is normal.     Breath sounds: Normal breath sounds. No wheezing or rhonchi.     Comments: Bibasilar crackles noted (L > R). Saturating >92% on RA. Abdominal:     General: Bowel sounds are normal. There is no distension.     Palpations: Abdomen is soft.     Tenderness: There is no abdominal tenderness. There is no guarding or rebound.  Musculoskeletal:        General: Normal range of motion.     Cervical back: Normal range of  motion.     Comments: L BKA noted with prosthesis in place. RLE without peripheral edema, does have chronic venous stasis changes.   Skin:    General: Skin is warm and dry.  Neurological:     General: No focal deficit present.     Mental Status: She is alert.  Psychiatric:        Mood and Affect: Mood normal.        Behavior: Behavior normal.      Data Reviewed:  There are no new results to review at this time.    Latest Ref Rng & Units 10/21/2023    4:03 PM 10/12/2023    4:29 AM 10/12/2023   12:10 AM  CBC  WBC 4.0 - 10.5 K/uL 13.1  6.4  7.5   Hemoglobin 12.0 - 15.0 g/dL 9.9  9.7  9.4   Hematocrit 36.0 - 46.0 % 31.0  31.5  30.1   Platelets 150 - 400 K/uL 180  198  245       Latest Ref Rng & Units 10/21/2023    4:03 PM 10/14/2023    3:52 AM 10/13/2023    3:13 AM  CMP  Glucose 70 - 99 mg/dL 161  93  92   BUN 8 - 23 mg/dL 21  14  12    Creatinine 0.44 - 1.00 mg/dL 0.96  0.45  4.09   Sodium 135 - 145 mmol/L 131  136  139   Potassium 3.5 - 5.1 mmol/L 5.7  3.9  3.9   Chloride 98 - 111 mmol/L 96  101  104   CO2 22 - 32 mmol/L 21  25  23    Calcium 8.9 - 10.3 mg/dL 8.4  8.5  8.8   Total Protein 6.5 - 8.1 g/dL 6.3     Total Bilirubin 0.0 - 1.2 mg/dL 1.4     Alkaline Phos 38 - 126 U/L 57     AST 15 - 41 U/L 40     ALT 0 - 44 U/L 15      Urinalysis    Component Value Date/Time   COLORURINE YELLOW 10/21/2023 1616   APPEARANCEUR TURBID (A) 10/21/2023 1616   LABSPEC 1.008 10/21/2023 1616   PHURINE 7.0 10/21/2023 1616   GLUCOSEU NEGATIVE 10/21/2023 1616  HGBUR SMALL (A) 10/21/2023 1616   BILIRUBINUR NEGATIVE 10/21/2023 1616   KETONESUR NEGATIVE 10/21/2023 1616   PROTEINUR 100 (A) 10/21/2023 1616   UROBILINOGEN 0.2 06/13/2014 0018   NITRITE NEGATIVE 10/21/2023 1616   LEUKOCYTESUR MODERATE (A) 10/21/2023 1616    Lactic Acid, Venous    Component Value Date/Time   LATICACIDVEN 1.5 10/21/2023 1612   DG Chest Port 1 View Result Date: 10/21/2023 CLINICAL DATA:  Sepsis, vomiting  EXAM: PORTABLE CHEST 1 VIEW COMPARISON:  10/12/2023 FINDINGS: Lung apices obscured by the patient's chin and facial tissues. The patient is rotated to the left on today's radiograph, reducing diagnostic sensitivity and specificity. Atherosclerotic calcification of the aortic arch. Mitral annular calcification suspected. Blunting of the left lateral costophrenic angle with left basilar airspace opacity. Appearance favors a left pleural effusion and likely associated atelectasis although left basilar pneumonia is not excluded. Bony demineralization. Faint persistent interstitial accentuation in the lungs. Old rib deformities compatible with healed fractures. Previous blunting of the right lateral costophrenic angle is no longer readily apparent. IMPRESSION: 1. Left basilar airspace opacity with blunting of the left lateral costophrenic angle. Appearance favors a left pleural effusion and likely associated atelectasis although left basilar pneumonia is not excluded. 2. Faint persistent interstitial accentuation in the lungs. 3. Bony demineralization. 4. Aortic Atherosclerosis (ICD10-I70.0). Electronically Signed   By: Gaylyn Rong M.D.   On: 10/21/2023 17:38    Assessment and Plan: No notes have been filed under this hospital service. Service: Hospitalist  Sepsis, presumably 2/2 UTI Hx of recent ESBL UTI (09/2023) Patient found to have sepsis on presentation.  Noted to have a fever of 101.57F has become hypotensive since arrival to the ED with blood pressures ranging in the 90s-110s/50s.  Also leukocytosis and AKI. Urinalysis with turbid urine with hematuria, moderate leukocytes, >50 WBCs, many bacteria (though mild sq epithelial cells present).  Awaiting urine culture and blood cultures.  Patient started on IV Rocephin empirically in ED, but I have transitioned her to meropenem given recent ESBL UTI. Fortunately, lactic acid is normal. -meropenem per pharmacy, renally dosed -received 500cc IVF in ED,  providing another 500cc given low BP -f/u urine cultures, blood cultures x2 -trend fever curve -trend BP curve, goal MAP >65 -SCDs for DVT ppx (holding eliquis given concern for upper GI bleed) -telemetry -progressive floor  Hematemesis Hx of normocytic anemia Patient presenting after an episode of hematemesis. Only one episode noted thus far. She is on eliquis at nursing facility, last dose this morning. Her hemoglobin is stable at 9.9 (around her baseline).  BUN is normal at 21.  No history of liver disease.  She does have mildly elevated total bilirubin but this is likely due to cholestatic changes in the setting of sepsis.  Have consulted GI, who will evaluate patient tomorrow morning.  Recommended starting IV Protonix twice daily and making patient n.p.o. at midnight. -GI consulted, appreciate assistance -Holding home Eliquis -Trend hemoglobin curve, transfuse if hemoglobin less than 7 -IV PPI twice daily -zofran 4mg  q6h prn for nausea -N.p.o. at midnight  AKI on CKD 3A Baseline Cr 1-1.1. On admission, Cr 1.80. Suspected to be secondary to hypovolemia. Despite elevated BNP, she is dry on exam. Received 500cc IVF in the ED, providing another 500cc given low BP. Will check renal U/S.  -IVF -f/u renal ultrasound -trend kidney function -monitor UOP -avoid nephrotoxic medications as able  Left pleural effusion with associated atelectasis Patient with bibasilar crackles on exam, left greater than right.  Chest x-ray  showing left pleural effusion with associated atelectasis, though unable to rule out left basilar pneumonia.  I suspect that patient's findings are more related to pleural effusion and bilateral positional atelectasis.  She is saturating well on room air and does not have any acute respiratory symptoms, so low suspicion for pneumonia as etiology of her sepsis. -incentive spirometry -PT/OT  Hyperkalemia -K 5.7 -given dose of lokelma 10g once -trend potassium -no obvious  hyperkalemia-related EKG changes noted  Mild hyponatremia -suspected 2/2 hypovolemia -received 500cc IVF in the ED -trend sodium  Permanent atrial fibrillation with ventricular bigeminy -patient on eliquis at home, currently holding given suspicion for GI bleed -holding home metoprolol tartrate BID given low BP -currently rate controlled -telemetry  Chronic diastolic HF ECHO 08/6107 showing LVEF 60-65%, no RWMA, moderate LVH, indeterminate diastolic parameters, severe LA dilation, severe RA dilation. She does not appear volume overloaded on exam. She does have bibasilar crackles (L > R), but I suspect this is reflective of positional atelectasis with a left pleural effusion also noted on CXR. Discussed with cardiology briefly, who advised that BNP alone is not reflective of volume overload. She is on lasix 20mg  daily and metoprolol 25mg  BID at home. Will hold these for now while providing volume repletion. -f/u ECHO -holding home lasix while providing IVF -holding metoprolol given low BP -can consider cardiology consult at a later time should patient develop overt volume overload -daily weights -strict I/O's  Hx of CVA HLD -holding eliquis given concern for upper GI bleed -resume home ezetimibe-simvastatin 10-20mg  qhs  Depression -resume home celexa 10mg  daily   Advance Care Planning:   Code Status: Limited: Do not attempt resuscitation (DNR) -DNR-LIMITED -Do Not Intubate/DNI    Consults: GI  Family Communication: no family at bedside  Severity of Illness: The appropriate patient status for this patient is OBSERVATION. Observation status is judged to be reasonable and necessary in order to provide the required intensity of service to ensure the patient's safety. The patient's presenting symptoms, physical exam findings, and initial radiographic and laboratory data in the context of their medical condition is felt to place them at decreased risk for further clinical deterioration.  Furthermore, it is anticipated that the patient will be medically stable for discharge from the hospital within 2 midnights of admission.   Portions of this note were generated with Scientist, clinical (histocompatibility and immunogenetics). Dictation errors may occur despite best attempts at proofreading.   Author: Briscoe Burns, MD 10/21/2023 7:34 PM  For on call review [email or web address].

## 2023-10-21 NOTE — ED Triage Notes (Signed)
 Per EMS  Vomiting Black/brown Happened after breakfast Low oxygen 86% RA Placed on 4L   96% Fever 103 at the facility

## 2023-10-21 NOTE — ED Provider Notes (Signed)
 [place] EMERGENCY DEPARTMENT AT [facility] Provider Note   CSN: [id number] Arrival date & time: 10/21/23  1435     History  Chief Complaint  Patient presents with  . Fever  . Hematemesis    Ana Franco is a 89 y.o. female.  This is a pleasant 89 year old female presenting emergency department for dark emesis this morning after breakfast.  Patient is alert to person place, not time.  She has no specific complaints states she feels like her normal self.  Reports that she had some nausea earlier and had dark-colored emesis.  EMS reported that her oxygen was low, denies shortness of breath currently and is on room air saturating in the mid 90s.  Denies headache, chest pain, shortness of breath, abdominal pain.   Fever      Home Medications Prior to Admission medications   Medication Sig Start Date End Date Taking? Authorizing Provider  Acetaminophen 325 MG CHEW Chew 650 mg by mouth every 6 (six) hours as needed (pain, fever, headache).   Yes [provider]  apixaban (ELIQUIS) 5 MG TABS tablet Take 1 tablet (5 mg total) by mouth 2 (two) times daily. 01/09/21  Yes Tyrone Nine, MD  beta carotene 40981 UNIT capsule Take 25,000 Units by mouth daily. 12/07/21  Yes [provider]  CALCIUM+D3 500-10 MG-MCG TABS Take 1 tablet by mouth in the morning. 12/16/22  Yes [provider]  citalopram (CELEXA) 20 MG tablet Take 10 mg by mouth daily.   Yes [provider]  clotrimazole (LOTRIMIN) 1 % cream Apply 1 Application topically 2 (two) times daily. Apply to right foot 04/29/23  Yes [provider]  esomeprazole (NEXIUM) 40 MG capsule Take 40 mg by mouth daily.    Yes [provider]  ezetimibe-simvastatin (VYTORIN) 10-20 MG tablet Take 1 tablet by mouth every evening. 11/17/21  Yes [provider]  furosemide (LASIX) 20 MG tablet Take 1 tablet (20 mg total) by mouth daily. 10/14/23  Yes Mdala-Gausi, Gwenette Greet, MD   gabapentin (NEURONTIN) 100 MG capsule Take 100 mg by mouth 3 (three) times daily. 11/28/21  Yes [provider]  metoprolol tartrate (LOPRESSOR) 25 MG tablet Take 1 tablet (25 mg total) by mouth 2 (two) times daily. Hold for SBP < 105 or HR < 60 12/16/22  Yes Danford, Earl Lites, MD  potassium chloride (KLOR-CON) 20 MEQ packet Take 20 mEq by mouth in the morning.   Yes [provider]  vitamin B-12 (CYANOCOBALAMIN) 500 MCG tablet Take 500 mcg by mouth every morning.   Yes [provider]      Allergies    Patient has no known allergies.    Review of Systems   Review of Systems  Constitutional:  Positive for fever.    Physical Exam Updated Vital Signs BP (!) 110/52   Pulse 80   Temp 99 F (37.2 C) (Oral)   Resp 16   Ht 5\' 8"  (1.727 m)   Wt 77.1 kg   SpO2 97%   BMI 25.85 kg/m  Physical Exam Vitals and nursing note reviewed.  Constitutional:      General: She is not in acute distress.    Appearance: She is not toxic-appearing.  HENT:     Head: Normocephalic.     Nose: Nose normal.     Mouth/Throat:     Mouth: Mucous membranes are dry.  Eyes:     Conjunctiva/sclera: Conjunctivae normal.  Cardiovascular:     Rate  and Rhythm: Normal rate. Rhythm irregular.  Pulmonary:     Effort: Pulmonary effort is normal.     Breath sounds: Normal breath sounds.  Abdominal:     General: Abdomen is flat. There is no distension.     Palpations: Abdomen is soft.     Tenderness: There is no abdominal tenderness. There is no guarding or rebound.  Musculoskeletal:        General: Normal range of motion.  Skin:    General: Skin is warm and dry.     Capillary Refill: Capillary refill takes less than 2 seconds.  Neurological:     General: No focal deficit present.     Mental Status: She is alert.  Psychiatric:        Mood and Affect: Mood normal.        Behavior: Behavior normal.     ED Results / Procedures / Treatments   Labs (all labs ordered are  listed, but only abnormal results are displayed) Labs Reviewed  COMPREHENSIVE METABOLIC PANEL - Abnormal; Notable for the following components:      Result Value   Sodium 131 (*)    Potassium 5.7 (*)    Chloride 96 (*)    CO2 21 (*)    Glucose, Bld 107 (*)    Creatinine, Ser 1.80 (*)    Calcium 8.4 (*)    Total Protein 6.3 (*)    Albumin 3.0 (*)    Total Bilirubin 1.4 (*)    GFR, Estimated 26 (*)    All other components within normal limits  CBC WITH DIFFERENTIAL/PLATELET - Abnormal; Notable for the following components:   WBC 13.1 (*)    RBC 3.35 (*)    Hemoglobin 9.9 (*)    HCT 31.0 (*)    Neutro Abs 10.7 (*)    Monocytes Absolute 1.3 (*)    All other components within normal limits  PROTIME-INR - Abnormal; Notable for the following components:   Prothrombin Time 18.3 (*)    INR 1.5 (*)    All other components within normal limits  URINALYSIS, W/ REFLEX TO CULTURE (INFECTION SUSPECTED) - Abnormal; Notable for the following components:   APPearance TURBID (*)    Hgb urine dipstick SMALL (*)    Protein, ur 100 (*)    Leukocytes,Ua MODERATE (*)    Bacteria, UA MANY (*)    All other components within normal limits  BRAIN NATRIURETIC PEPTIDE - Abnormal; Notable for the following components:   B Natriuretic Peptide 485.9 (*)    All other components within normal limits  RESP PANEL BY RT-PCR (RSV, FLU A&B, COVID)  RVPGX2  CULTURE, BLOOD (ROUTINE X 2)  CULTURE, BLOOD (ROUTINE X 2)  APTT  LIPASE, BLOOD  I-STAT CG4 LACTIC ACID, ED  I-STAT CG4 LACTIC ACID, ED  TROPONIN I (HIGH SENSITIVITY)  TROPONIN I (HIGH SENSITIVITY)    EKG EKG Interpretation Date/Time:  Tuesday October 21 2023 16:31:22 EDT Ventricular Rate:  80 PR Interval:    QRS Duration:  91 QT Interval:  410 QTC Calculation: 473 R Axis:   -38  Text Interpretation: Atrial fibrillation Ventricular bigeminy Inferior infarct, old Consider anterior infarct Confirmed by Estanislado Pandy [id number]) on 10/21/2023 5:19:10  PM  Radiology DG Chest Port 1 View Result Date: 10/21/2023 CLINICAL DATA:  Sepsis, vomiting EXAM: PORTABLE CHEST 1 VIEW COMPARISON:  10/12/2023 FINDINGS: Lung apices obscured by the patient's chin and facial tissues. The patient is rotated to the left on today's radiograph, reducing diagnostic sensitivity  and specificity. Atherosclerotic calcification of the aortic arch. Mitral annular calcification suspected. Blunting of the left lateral costophrenic angle with left basilar airspace opacity. Appearance favors a left pleural effusion and likely associated atelectasis although left basilar pneumonia is not excluded. Bony demineralization. Faint persistent interstitial accentuation in the lungs. Old rib deformities compatible with healed fractures. Previous blunting of the right lateral costophrenic angle is no longer readily apparent. IMPRESSION: 1. Left basilar airspace opacity with blunting of the left lateral costophrenic angle. Appearance favors a left pleural effusion and likely associated atelectasis although left basilar pneumonia is not excluded. 2. Faint persistent interstitial accentuation in the lungs. 3. Bony demineralization. 4. Aortic Atherosclerosis (ICD10-I70.0). Electronically Signed   By: Gaylyn Rong M.D.   On: 10/21/2023 17:38    Procedures Procedures    Medications Ordered in ED Medications  acetaminophen (TYLENOL) tablet 650 mg (650 mg Oral Given 10/21/23 1543)  cefTRIAXone (ROCEPHIN) 1 g in sodium chloride 0.9 % 100 mL IVPB (1 g Intravenous New Bag/Given 10/21/23 1728)  sodium chloride 0.9 % bolus 500 mL (500 mLs Intravenous New Bag/Given 10/21/23 1727)    ED Course/ Medical Decision Making/ A&P Clinical Course as of 10/21/23 1850  Tue Oct 21, 2023  1505 Discharged on 3/4 per chart review: "89 year old woman with PMH of atrial fibrillation, diastolic CHF, chronic kidney disease stage III, chronic anemia, GERD, hyperlipidemia who was recently admitted to the hospital for  AKI on CKD with COVID infection and ESBL UTI. Lasix was held during hospitalization and at discharge. Patient presented from her facility with shortness of breath and diagnosed with CHF exacerbation. " [TY]  1547 Patient with fever; however clinically well appearing and was recently diagnosed with COVID and UTI. Fever could be 2/2 to COVID rather than bacterial process. Aditttionally, was recently admitted for CHF exacerbation. Will hold on IVFs as she was reportedly hypoxic with EMS and I am concerned given her CHF history that aggressive IVFs could be catastrophic from a respiratory/CHF standpoint. [TY]  1631 Lactic Acid, Venous: 1.5 [TY]  1657 Urinalysis, w/ Reflex to Culture (Infection Suspected) -Urine, Clean Catch(!) C/w UTI. Will treat with antibiotics.  [TY]  1657 WBC(!): 13.1 [TY]  1840 B Natriuretic Peptide(!): 485.9 Appears similar to recent hospitalization [TY]  1840 Potassium(!): 5.7 Hemolysis.  Not having EKG changes. [TY]  1840 Comprehensive metabolic panel(!) AKI.  Low sodium and chloride, clinically does appear slightly dry.  Family members at bedside note that they have been restricting her fluid intake at facility.  Small IV fluid bolus given [TY]  1840 Troponin I (High Sensitivity): 16 ACS unlikely [TY]  1840 Lipase: 44 Pancreatitis unlikely [TY]  1841 Hemoglobin(!): 9.9 Stable compared to prior admission.  No further episodes of vomiting here. [TY]  1841 BUN: 21 Not significantly elevated; with which would point away from upper GI bleed [TY]  1841 This is a 89 year old female presenting emergency department with fever and dark vomit.  Appears to have AKI, and UTI.  Given Rocephin and small IV fluid bolus.  Clinically well-appearing.  Given advanced age and comorbid disease will admit for further management of her UTI and her AKI. [TY]    Clinical Course User Index [TY] Coral Spikes, DO                                 Medical Decision Making This is a 89 year old  female presenting emergency department for dark-colored emesis today.  Per chart review was recently in hospital for CHF exacerbation, noted to have COVID and ESBL UTI at that time.  She is febrile here one 1.4.,  Nontachycardic, normotensive.  Reportedly was hypoxic with EMS 86% on room air, however currently saturating in the mid 90s on room air and does not appear to be in respiratory distress. Not vomitting currently; benign abdominal exam.  She is taking Eliquis which would predispose her to a GI hemorrhage.  Do not see prior EGD/colonoscopy records in the chart.  Will get broad workup to evaluate for infectious process as well as include blood loss anemia/GI bleed.  Patient does have DNR paperwork with her.  See ED course for further MDM/disposition.  Amount and/or Complexity of Data Reviewed Independent Historian:     Details: Family members note fluid restriction while at facility. External Data Reviewed:     Details: See ED course Labs: ordered. Decision-making details documented in ED Course.    Details: See ED course Radiology: ordered and independent interpretation performed. Decision-making details documented in ED Course. ECG/medicine tests:  Decision-making details documented in ED Course.  Risk OTC drugs. Decision regarding hospitalization.         Final Clinical Impression(s) / ED Diagnoses Final diagnoses:  AKI (acute kidney injury) (HCC)  Sepsis, due to unspecified organism, unspecified whether acute organ dysfunction present [facility])    Rx / DC Orders ED Discharge Orders     None         Coral Spikes, DO 10/21/23 1843

## 2023-10-21 NOTE — Progress Notes (Signed)
 Pharmacy Antibiotic Note  Ana Franco is a 89 y.o. female who was recently discharged with ESBL UTI now admitted on 10/21/2023 with sepsis.  Pharmacy has been consulted for meropenem dosing.  Plan: Meropenem 500mg  IV Q12h Trend WBC, fever, renal function F/u cultures, clinical progress, levels as indicated De-escalate when able   Height: 5\' 8"  (172.7 cm) Weight: 77.1 kg (170 lb) IBW/kg (Calculated) : 63.9  Temp (24hrs), Avg:100.2 F (37.9 C), Min:99 F (37.2 C), Max:101.4 F (38.6 C)  Recent Labs  Lab 10/21/23 1603 10/21/23 1612  WBC 13.1*  --   CREATININE 1.80*  --   LATICACIDVEN  --  1.5    Estimated Creatinine Clearance: 20.4 mL/min (A) (by C-G formula based on SCr of 1.8 mg/dL (H)).    No Known Allergies  Thank you for allowing pharmacy to be a part of this patient's care.  Thelma Barge, PharmD, BCPS Clinical Pharmacist

## 2023-10-22 ENCOUNTER — Encounter (HOSPITAL_COMMUNITY): Payer: Self-pay | Admitting: Student

## 2023-10-22 ENCOUNTER — Observation Stay (HOSPITAL_BASED_OUTPATIENT_CLINIC_OR_DEPARTMENT_OTHER)

## 2023-10-22 DIAGNOSIS — A419 Sepsis, unspecified organism: Secondary | ICD-10-CM | POA: Diagnosis present

## 2023-10-22 DIAGNOSIS — F32A Depression, unspecified: Secondary | ICD-10-CM | POA: Diagnosis present

## 2023-10-22 DIAGNOSIS — E861 Hypovolemia: Secondary | ICD-10-CM | POA: Diagnosis present

## 2023-10-22 DIAGNOSIS — D649 Anemia, unspecified: Secondary | ICD-10-CM | POA: Diagnosis not present

## 2023-10-22 DIAGNOSIS — Z66 Do not resuscitate: Secondary | ICD-10-CM | POA: Diagnosis present

## 2023-10-22 DIAGNOSIS — D519 Vitamin B12 deficiency anemia, unspecified: Secondary | ICD-10-CM | POA: Diagnosis present

## 2023-10-22 DIAGNOSIS — R739 Hyperglycemia, unspecified: Secondary | ICD-10-CM | POA: Diagnosis present

## 2023-10-22 DIAGNOSIS — Z79899 Other long term (current) drug therapy: Secondary | ICD-10-CM | POA: Diagnosis not present

## 2023-10-22 DIAGNOSIS — K920 Hematemesis: Secondary | ICD-10-CM | POA: Diagnosis present

## 2023-10-22 DIAGNOSIS — E875 Hyperkalemia: Secondary | ICD-10-CM | POA: Diagnosis present

## 2023-10-22 DIAGNOSIS — N1831 Chronic kidney disease, stage 3a: Secondary | ICD-10-CM | POA: Diagnosis present

## 2023-10-22 DIAGNOSIS — I4891 Unspecified atrial fibrillation: Secondary | ICD-10-CM | POA: Diagnosis not present

## 2023-10-22 DIAGNOSIS — N136 Pyonephrosis: Secondary | ICD-10-CM | POA: Diagnosis present

## 2023-10-22 DIAGNOSIS — I5032 Chronic diastolic (congestive) heart failure: Secondary | ICD-10-CM | POA: Diagnosis present

## 2023-10-22 DIAGNOSIS — I509 Heart failure, unspecified: Secondary | ICD-10-CM

## 2023-10-22 DIAGNOSIS — N179 Acute kidney failure, unspecified: Secondary | ICD-10-CM | POA: Diagnosis present

## 2023-10-22 DIAGNOSIS — Z89512 Acquired absence of left leg below knee: Secondary | ICD-10-CM | POA: Diagnosis not present

## 2023-10-22 DIAGNOSIS — I130 Hypertensive heart and chronic kidney disease with heart failure and stage 1 through stage 4 chronic kidney disease, or unspecified chronic kidney disease: Secondary | ICD-10-CM | POA: Diagnosis present

## 2023-10-22 DIAGNOSIS — Z7901 Long term (current) use of anticoagulants: Secondary | ICD-10-CM | POA: Diagnosis not present

## 2023-10-22 DIAGNOSIS — I2510 Atherosclerotic heart disease of native coronary artery without angina pectoris: Secondary | ICD-10-CM | POA: Diagnosis present

## 2023-10-22 DIAGNOSIS — I4821 Permanent atrial fibrillation: Secondary | ICD-10-CM | POA: Diagnosis present

## 2023-10-22 DIAGNOSIS — E785 Hyperlipidemia, unspecified: Secondary | ICD-10-CM | POA: Diagnosis present

## 2023-10-22 DIAGNOSIS — Z1152 Encounter for screening for COVID-19: Secondary | ICD-10-CM | POA: Diagnosis not present

## 2023-10-22 DIAGNOSIS — Z8673 Personal history of transient ischemic attack (TIA), and cerebral infarction without residual deficits: Secondary | ICD-10-CM | POA: Diagnosis not present

## 2023-10-22 DIAGNOSIS — E871 Hypo-osmolality and hyponatremia: Secondary | ICD-10-CM | POA: Diagnosis present

## 2023-10-22 DIAGNOSIS — I252 Old myocardial infarction: Secondary | ICD-10-CM | POA: Diagnosis not present

## 2023-10-22 DIAGNOSIS — K219 Gastro-esophageal reflux disease without esophagitis: Secondary | ICD-10-CM | POA: Diagnosis present

## 2023-10-22 LAB — CBC
HCT: 26.2 % — ABNORMAL LOW (ref 36.0–46.0)
Hemoglobin: 8.4 g/dL — ABNORMAL LOW (ref 12.0–15.0)
MCH: 29.4 pg (ref 26.0–34.0)
MCHC: 32.1 g/dL (ref 30.0–36.0)
MCV: 91.6 fL (ref 80.0–100.0)
Platelets: 163 10*3/uL (ref 150–400)
RBC: 2.86 MIL/uL — ABNORMAL LOW (ref 3.87–5.11)
RDW: 14.5 % (ref 11.5–15.5)
WBC: 8.7 10*3/uL (ref 4.0–10.5)
nRBC: 0 % (ref 0.0–0.2)

## 2023-10-22 LAB — ECHOCARDIOGRAM COMPLETE
AR max vel: 1.22 cm2
AV Area VTI: 1.16 cm2
AV Area mean vel: 1.21 cm2
AV Mean grad: 6 mmHg
AV Peak grad: 10.4 mmHg
Ao pk vel: 1.61 m/s
Area-P 1/2: 3.49 cm2
Calc EF: 54.6 %
Height: 68 in
MV VTI: 1.4 cm2
S' Lateral: 2.3 cm
Single Plane A2C EF: 54.6 %
Single Plane A4C EF: 53.2 %
Weight: 2814.83 [oz_av]

## 2023-10-22 LAB — BASIC METABOLIC PANEL
Anion gap: 10 (ref 5–15)
BUN: 23 mg/dL (ref 8–23)
CO2: 23 mmol/L (ref 22–32)
Calcium: 8 mg/dL — ABNORMAL LOW (ref 8.9–10.3)
Chloride: 99 mmol/L (ref 98–111)
Creatinine, Ser: 1.94 mg/dL — ABNORMAL HIGH (ref 0.44–1.00)
GFR, Estimated: 23 mL/min — ABNORMAL LOW (ref >60)
Glucose, Bld: 100 mg/dL — ABNORMAL HIGH (ref 70–99)
Potassium: 4.4 mmol/L (ref 3.5–5.1)
Sodium: 132 mmol/L — ABNORMAL LOW (ref 135–145)

## 2023-10-22 LAB — HEMOGLOBIN AND HEMATOCRIT, BLOOD
HCT: 26.9 % — ABNORMAL LOW (ref 36.0–46.0)
HCT: 28.2 % — ABNORMAL LOW (ref 36.0–46.0)
Hemoglobin: 8.6 g/dL — ABNORMAL LOW (ref 12.0–15.0)
Hemoglobin: 9 g/dL — ABNORMAL LOW (ref 12.0–15.0)

## 2023-10-22 NOTE — Plan of Care (Signed)
  Problem: Education: Goal: Knowledge of General Education information will improve Description: Including pain rating scale, medication(s)/side effects and non-pharmacologic comfort measures Outcome: Progressing   Problem: Health Behavior/Discharge Planning: Goal: Ability to manage health-related needs will improve Outcome: Progressing   Problem: Clinical Measurements: Goal: Ability to maintain clinical measurements within normal limits will improve Outcome: Progressing Goal: Will remain free from infection Outcome: Progressing Goal: Diagnostic test results will improve Outcome: Progressing Goal: Respiratory complications will improve Outcome: Progressing Goal: Cardiovascular complication will be avoided Outcome: Progressing   Problem: Activity: Goal: Risk for activity intolerance will decrease Outcome: Progressing   Problem: Nutrition: Goal: Adequate nutrition will be maintained Outcome: Progressing   Problem: Coping: Goal: Level of anxiety will decrease Outcome: Progressing   Problem: Elimination: Goal: Will not experience complications related to bowel motility Outcome: Progressing Goal: Will not experience complications related to urinary retention Outcome: Progressing   Problem: Pain Managment: Goal: General experience of comfort will improve and/or be controlled Outcome: Progressing   Problem: Safety: Goal: Ability to remain free from injury will improve Outcome: Progressing   Problem: Skin Integrity: Goal: Risk for impaired skin integrity will decrease Outcome: Progressing   Problem: Education: Goal: Ability to demonstrate management of disease process will improve Outcome: Progressing Goal: Ability to verbalize understanding of medication therapies will improve Outcome: Progressing Goal: Individualized Educational Video(s) Outcome: Progressing   Problem: Activity: Goal: Capacity to carry out activities will improve Outcome: Progressing    Problem: Cardiac: Goal: Ability to achieve and maintain adequate cardiopulmonary perfusion will improve Outcome: Progressing   Problem: Education: Goal: Knowledge of disease or condition will improve Outcome: Progressing Goal: Understanding of medication regimen will improve Outcome: Progressing Goal: Individualized Educational Video(s) Outcome: Progressing   Problem: Activity: Goal: Ability to tolerate increased activity will improve Outcome: Progressing   Problem: Cardiac: Goal: Ability to achieve and maintain adequate cardiopulmonary perfusion will improve Outcome: Progressing   Problem: Health Behavior/Discharge Planning: Goal: Ability to safely manage health-related needs after discharge will improve Outcome: Progressing

## 2023-10-22 NOTE — Progress Notes (Signed)
 Heart Failure Navigator Progress Note  Assessed for Heart & Vascular TOC clinic readiness.  Patient does not meet criteria due to EF 60-65%. Has a scheduled [facility] appointment on 12/09/23, No HF TOC .   Navigator will sign off at this time.   Rhae Hammock, BSN, Scientist, clinical (histocompatibility and immunogenetics) Only

## 2023-10-22 NOTE — Evaluation (Signed)
 Physical Therapy Evaluation Patient Details Name: Ana Franco MRN: [id number] DOB: January 18, 1926 Today's Date: 10/22/2023  History of Present Illness  89 y.o. female presents to [facility]  presenting to the ED 10/21/23 after an episode of hematemesis. Sepsis, UTI, AKI, L pleural effusion, PMH includes L BKA, afib on Eliquis, HTN, CHF, CAD, CKD, TIA/CVA, HLD, MI  Clinical Impression  Pt admitted secondary to problem above with deficits below. PTA patient was residing at ALF and had recent decline in functional status from ambulating independently with RW to requiring assist. Pt currently requires mod assist to come to stand and CGA to ambulate x 15 ft with RW.  Anticipate patient will benefit from PT to address problems listed below.Will continue to follow acutely to maximize functional mobility independence and safety.  Recommend HHPT at ALF for continued PT on discharge.          If plan is discharge home, recommend the following: A lot of help with walking and/or transfers;A lot of help with bathing/dressing/bathroom;Assist for transportation;Help with stairs or ramp for entrance;Assistance with cooking/housework   Can travel by private vehicle   Yes    Equipment Recommendations None recommended by PT  Recommendations for Other Services       Functional Status Assessment Patient has had a recent decline in their functional status and demonstrates the ability to make significant improvements in function in a reasonable and predictable amount of time.     Precautions / Restrictions Precautions Precautions: Fall Precaution/Restrictions Comments: L BKA, prosthetic in room Restrictions Weight Bearing Restrictions Per Provider Order: No      Mobility  Bed Mobility               General bed mobility comments: up on BSC on arrival (with nurse tech)    Transfers Overall transfer level: Needs assistance Equipment used: Rolling walker (2 wheels) Transfers: Sit to/from Stand, Bed to  chair/wheelchair/BSC Sit to Stand: Mod assist   Step pivot transfers: Mod assist       General transfer comment: ModA for boost up and steadying, cues for hand placement. Pt has difficulty eccentric lowering    Ambulation/Gait Ambulation/Gait assistance: Contact guard assist Gait Distance (Feet): 15 Feet Assistive device: Rolling walker (2 wheels) Gait Pattern/deviations: Step-through pattern, Shuffle, Staggering left, Staggering right Gait velocity: decr     General Gait Details: no imbalance noted  Stairs            Wheelchair Mobility     Tilt Bed    Modified Rankin (Stroke Patients Only)       Balance Overall balance assessment: Needs assistance, Mild deficits observed, not formally tested Sitting-balance support: No upper extremity supported, Feet supported Sitting balance-Leahy Scale: Fair     Standing balance support: Bilateral upper extremity supported, During functional activity, Reliant on assistive device for balance Standing balance-Leahy Scale: Poor Standing balance comment: reliant on RW                             Pertinent Vitals/Pain Pain Assessment Pain Assessment: No/denies pain    Home Living Family/patient expects to be discharged to:: Assisted living                 Home Equipment: Agricultural consultant (2 wheels);Wheelchair - manual;Shower seat      Prior Function Prior Level of Function : Needs assist             Mobility Comments: ambulating short distances to/from  bathroom with RW and LLE prosthetic and assist ADLs Comments: does not go to dining hall, typically eats in room (meals provided). staff assists with bathing on shower seat 2x/wk; pt reports assist with toileting     Extremity/Trunk Assessment   Upper Extremity Assessment Upper Extremity Assessment: Defer to OT evaluation    Lower Extremity Assessment LLE Deficits / Details: L BKA    Cervical / Trunk Assessment Cervical / Trunk Assessment:  Kyphotic  Communication   Communication Communication: No apparent difficulties    Cognition Arousal: Alert Behavior During Therapy: WFL for tasks assessed/performed   PT - Cognitive impairments: No family/caregiver present to determine baseline, Sequencing, Problem solving, Safety/Judgement                       PT - Cognition Comments: cues for sequencing Following commands: Intact       Cueing Cueing Techniques: Verbal cues, Tactile cues     General Comments General comments (skin integrity, edema, etc.): HR 102-112    Exercises     Assessment/Plan    PT Assessment Patient needs continued PT services  PT Problem List Decreased strength;Decreased activity tolerance;Decreased balance;Decreased mobility;Decreased knowledge of use of DME;Decreased safety awareness       PT Treatment Interventions DME instruction;Gait training;Functional mobility training;Stair training;Therapeutic exercise;Therapeutic activities;Balance training;Neuromuscular re-education;Patient/family education    PT Goals (Current goals can be found in the Care Plan section)  Acute Rehab PT Goals Patient Stated Goal: to get better PT Goal Formulation: With patient Time For Goal Achievement: 11/05/23 Potential to Achieve Goals: Good    Frequency Min 1X/week     Co-evaluation               AM-PAC PT "6 Clicks" Mobility  Outcome Measure Help needed turning from your back to your side while in a flat bed without using bedrails?: A Little Help needed moving from lying on your back to sitting on the side of a flat bed without using bedrails?: A Little Help needed moving to and from a bed to a chair (including a wheelchair)?: A Lot Help needed standing up from a chair using your arms (e.g., wheelchair or bedside chair)?: A Lot Help needed to walk in hospital room?: A Little Help needed climbing 3-5 steps with a railing? : Total 6 Click Score: 14    End of Session Equipment Utilized  During Treatment: Gait belt Activity Tolerance: Patient tolerated treatment well Patient left: with call bell/phone within reach;in chair;Other (comment) (chair alarm in place; unit has no batteries; pt able to verbalize not to get up alone and RN aware batteries have been ordered.) Nurse Communication: Mobility status PT Visit Diagnosis: Unsteadiness on feet (R26.81);Muscle weakness (generalized) (M62.81)    Time: 2536-6440 PT Time Calculation (min) (ACUTE ONLY): 23 min   Charges:   PT Evaluation $PT Eval Low Complexity: 1 Low PT Treatments $Gait Training: 8-22 mins PT General Charges $$ ACUTE PT VISIT: 1 Visit          Jerolyn Center, PT Acute Rehabilitation Services  Office [phone]   Zena Amos 10/22/2023, 11:02 AM

## 2023-10-22 NOTE — Progress Notes (Signed)
 PROGRESS NOTE  Ana Franco VHQ:[id number] DOB: [birth year] DOA: 10/21/2023 PCP: Sherron Monday, MD   LOS: 0 days   Brief narrative:   Ana Franco is a 89 y.o. female with medical history significant of chronic diastolic heart failure, CKD 3B, CAD, history of CVA, hypertension, GERD, hyperlipidemia, permanent atrial fibrillation, normocytic anemia to the hospital after an episode of hematemesis.  Nasocort after taking breakfast.  EMS was called in and was noted to have hypoxia as well and was started on nasal cannula oxygen.  In the ED patient was noted to be febrile with a temperature of 101.4 F.  Had mild leukocytosis at 13.1.  CMP with mild hyponatremia, hyperkalemia to 5.7, mild hyperglycemia, creatinine 1.8 (baseline around 1-1.1), BUN 21.  PT 18.3, INR 1.5.  Respiratory panel negative for COVID, flu, RSV.  BNP 485.  Troponins flat at 16 x 2. EKG with atrial fibrillation and ventricular bigeminy noted. Lactate 1.5.  Urinalysis with hematuria, moderate leukocytes, greater than 50 WBCs, many bacteria (though does have 6-10 squamous epithelial cells).  Chest x-ray showing likely left pleural effusion with associated atelectasis, though left basilar pneumonia unable to be excluded. Patient also given 500 cc of normal saline by ED provider.  Patient was then considered for admission to hospital for further evaluation and treatment.     Assessment/Plan: Principal Problem:   Sepsis (HCC) Active Problems:   HLD (hyperlipidemia)   Permanent atrial fibrillation (HCC)   H/O: CVA (cerebrovascular accident)   Hx of BKA, left (HCC)   Acute kidney injury superimposed on stage 3a chronic kidney disease (HCC)   Hematemesis   Hyperkalemia  Sepsis, secondary to UTI Hx of recent ESBL UTI (09/2023) Patient had TEE with leukocytosis and abnormal urinalysis with AKI.  Follow urine and blood cultures.  History of ESBL UTI in the past on 10/04/23 so has been started on meropenem.  Received IV fluids.    Hematemesis Hx of normocytic anemia Had 1 episode of hematemesis.  On Eliquis at the skilled nursing facility.  Her hemoglobin was however at baseline..  Hemoglobin today at 8.4 from around 9.9.  Will continue to monitor.  Continue Protonix twice daily.  GI was consulted and plan for conservative treatment at this time..  Continue to hold Eliquis.  Antiemetics.  Monitor hemoglobin closely.   AKI on CKD 3A Baseline Cr 1-1.1. On admission, Cr 1.80.  Creatinine today at 1.9.  Received IV fluid bolus.  Ultrasound of the kidneys shows mild right hydronephrosis.  Continue intake and output charting.  Daily weights.  Will do Bladder scan.  Left pleural effusion with associated atelectasis No respiratory symptoms.  Continue incentive spirometry.   Hyperkalemia On presentation.  Received Lokelma x 1.  Potassium today 4.4.   Mild hyponatremia Thought to be secondary to hypovolemia.  Sodium today at 132 from 131.[name]  Received IV fluids.  Permanent atrial fibrillation with ventricular bigeminy Rate controlled.  On metoprolol twice daily at home.  Eliquis on hold.  Currently metoprolol on hold as well  Chronic diastolic HF ECHO 11/1322 showing LVEF 60-65%, no RWMA, moderate LVH, indeterminate diastolic parameters, severe LA dilation, severe RA dilation. She does not appear volume overloaded on exam.  Patient is on Lasix and metoprolol at home.  Currently on hold.  Continue daily weights intake and output charting.  Hx of CVA Hyperlipidemia. Hold Eliquis.  Continue ezetimibe-simvastatin   Depression Continue Celexa. DVT prophylaxis: SCDs Start: 10/21/23 1852   Disposition: Patient from assisted living facility.  Likely back  to facility in 1 to 2 days.  Status is: Observation  The patient will require care spanning > 2 midnights and should be moved to inpatient because: Pending clinical improvement, IV antibiotic,    Code Status:     Code Status: Limited: Do not attempt resuscitation (DNR)  -DNR-LIMITED -Do Not Intubate/DNI   Family Communication: Spoke with the patient's son Mr. Brett Canales at bedside.  Consultants: GI  Procedures: None  Anti-infectives:  Meropenem  Anti-infectives (From admission, onward)    Start     Dose/Rate Route Frequency Ordered Stop   10/21/23 2100  meropenem (MERREM) 500 mg in sodium chloride 0.9 % 100 mL IVPB        500 mg 200 mL/hr over 30 Minutes Intravenous Every 12 hours 10/21/23 2029     10/21/23 1700  cefTRIAXone (ROCEPHIN) 1 g in sodium chloride 0.9 % 100 mL IVPB        1 g 200 mL/hr over 30 Minutes Intravenous  Once 10/21/23 1657 10/21/23 1859        Subjective: Today, patient was seen and examined at bedside.  Patient sitting at bedside.  Denies any pain, nausea, vomiting, fever or chills.  Patient's son at bedside.  States that she feels better today.  Breathing is stable.  No further episodes of hematemesis or vomiting.  Objective: Vitals:   10/22/23 1217 10/22/23 1300  BP: (!) 143/63   Pulse:    Resp:  (!) 26  Temp:    SpO2:      Intake/Output Summary (Last 24 hours) at 10/22/2023 1352 Last data filed at 10/22/2023 0345 Gross per 24 hour  Intake 280 ml  Output 240 ml  Net 40 ml   Filed Weights   10/21/23 1440 10/22/23 0454  Weight: 77.1 kg 79.8 kg   Body mass index is 26.75 kg/m.   Physical Exam:  GENERAL: Patient is alert awake and oriented. Not in obvious distress.  Elderly female, Communicative, HENT: No scleral pallor or icterus. Pupils equally reactive to light. Oral mucosa is moist NECK: is supple, no gross swelling noted. CHEST: Clear to auscultation. No crackles or wheezes.  CVS: S1 and S2 heard, no murmur. Regular rate and rhythm.  ABDOMEN: Soft, non-tender, bowel sounds are present. EXTREMITIES: No edema.  Left lower extremity on prosthesis. CNS: Cranial nerves are intact. No focal motor deficits. SKIN: warm and dry without rashes.  Data Review: I have personally reviewed the following  laboratory data and studies,  CBC: Recent Labs  Lab 10/21/23 1603 10/22/23 0444 10/22/23 1151  WBC 13.1* 8.7  --   NEUTROABS 10.7*  --   --   HGB 9.9* 8.4* 8.6*  HCT 31.0* 26.2* 26.9*  MCV 92.5 91.6  --   PLT 180 163  --    Basic Metabolic Panel: Recent Labs  Lab 10/21/23 1603 10/22/23 0444  NA 131* 132*  K 5.7* 4.4  CL 96* 99  CO2 21* 23  GLUCOSE 107* 100*  BUN 21 23  CREATININE 1.80* 1.94*  CALCIUM 8.4* 8.0*   Liver Function Tests: Recent Labs  Lab 10/21/23 1603  AST 40  ALT 15  ALKPHOS 57  BILITOT 1.4*  PROT 6.3*  ALBUMIN 3.0*   Recent Labs  Lab 10/21/23 1603  LIPASE 44   No results for input(s): "AMMONIA" in the last 168 hours. Cardiac Enzymes: No results for input(s): "CKTOTAL", "CKMB", "CKMBINDEX", "TROPONINI" in the last 168 hours. BNP (last 3 results) Recent Labs    10/04/23 0601 10/12/23 0010  10/21/23 1603  BNP 295.6* 453.6* 485.9*    ProBNP (last 3 results) No results for input(s): "PROBNP" in the last 8760 hours.  CBG: No results for input(s): "GLUCAP" in the last 168 hours. Recent Results (from the past 240 hours)  Blood Culture (routine x 2)     Status: None (Preliminary result)   Collection Time: 10/21/23  4:03 PM   Specimen: BLOOD RIGHT ARM  Result Value Ref Range Status   Specimen Description BLOOD RIGHT ARM  Final   Special Requests   Final    BOTTLES DRAWN AEROBIC AND ANAEROBIC Blood Culture adequate volume   Culture   Final    NO GROWTH < 24 HOURS Performed at [facility], [address] [place], [place] [address]    Report Status PENDING  Incomplete  Blood Culture (routine x 2)     Status: None (Preliminary result)   Collection Time: 10/21/23  4:03 PM   Specimen: BLOOD RIGHT ARM  Result Value Ref Range Status   Specimen Description BLOOD RIGHT ARM  Final   Special Requests   Final    BOTTLES DRAWN AEROBIC AND ANAEROBIC Blood Culture adequate volume   Culture   Final    NO GROWTH < 24 HOURS Performed at  [facility], [address] [place], [place] [address]    Report Status PENDING  Incomplete  Resp panel by RT-PCR (RSV, Flu A&B, Covid) Anterior Nasal Swab     Status: None   Collection Time: 10/21/23  4:03 PM   Specimen: Anterior Nasal Swab  Result Value Ref Range Status   SARS Coronavirus 2 by RT PCR NEGATIVE NEGATIVE Final   Influenza A by PCR NEGATIVE NEGATIVE Final   Influenza B by PCR NEGATIVE NEGATIVE Final    Comment: (NOTE) The Xpert Xpress SARS-CoV-2/FLU/RSV plus assay is intended as an aid in the diagnosis of influenza from Nasopharyngeal swab specimens and should not be used as a sole basis for treatment. Nasal washings and aspirates are unacceptable for Xpert Xpress SARS-CoV-2/FLU/RSV testing.  Fact Sheet for Patients: BloggerCourse.com  Fact Sheet for Healthcare Providers: SeriousBroker.it  This test is not yet approved or cleared by the [place] FDA and has been authorized for detection and/or diagnosis of SARS-CoV-2 by FDA under an Emergency Use Authorization (EUA). This EUA will remain in effect (meaning this test can be used) for the duration of the COVID-19 declaration under Section 564(b)(1) of the Act, 21 U.S.C. section 360bbb-3(b)(1), unless the authorization is terminated or revoked.     Resp Syncytial Virus by PCR NEGATIVE NEGATIVE Final    Comment: (NOTE) Fact Sheet for Patients: BloggerCourse.com  Fact Sheet for Healthcare Providers: SeriousBroker.it  This test is not yet approved or cleared by the [place] FDA and has been authorized for detection and/or diagnosis of SARS-CoV-2 by FDA under an Emergency Use Authorization (EUA). This EUA will remain in effect (meaning this test can be used) for the duration of the COVID-19 declaration under Section 564(b)(1) of the Act, 21 U.S.C. section 360bbb-3(b)(1), unless the authorization is  terminated or revoked.  Performed at [facility], [address], [place], [place] [address]      Studies: ECHOCARDIOGRAM COMPLETE Result Date: 10/22/2023    ECHOCARDIOGRAM REPORT   Patient Name:   BRIA SPARR Date of Exam: 10/22/2023 Medical Rec #:  [id number]      Height:       68.0 in Accession #:    [id number]  Weight:       175.9 lb Date of Birth:  1926/03/28      BSA:          1.935 m Patient Age:    89 years       BP:           139/56 mmHg Patient Gender: F              HR:           40 bpm. Exam Location:  Inpatient Procedure: 2D Echo, Cardiac Doppler and Color Doppler (Both Spectral and Color            Flow Doppler were utilized during procedure). Indications:    CHF  History:        Patient has prior history of Echocardiogram examinations, most                 recent 12/14/2022. Stroke, Arrythmias:Atrial Fibrillation; Risk                 Factors:Hypertension.  Sonographer:    Amy Chionchio Referring Phys: [id number] Dimas Alexandria JINWALA IMPRESSIONS  1. Left ventricular ejection fraction, by estimation, is 60 to 65%. The left ventricle has normal function. The left ventricle has no regional wall motion abnormalities. There is moderate asymmetric left ventricular hypertrophy of the basal-septal segment. Left ventricular diastolic function could not be evaluated.  2. Right ventricular systolic function is normal. The right ventricular size is normal. There is mildly elevated pulmonary artery systolic pressure. The estimated right ventricular systolic pressure is 39.6 mmHg.  3. Left atrial size was moderately dilated.  4. The mitral valve is degenerative. Mild mitral valve regurgitation. No evidence of mitral stenosis. Severe mitral annular calcification.  5. Tricuspid valve regurgitation is moderate.  6. The aortic valve is tricuspid. There is mild calcification of the aortic valve. Aortic valve regurgitation is not visualized. Aortic valve sclerosis/calcification is present, without any evidence  of aortic stenosis.  7. The inferior vena cava is normal in size with greater than 50% respiratory variability, suggesting right atrial pressure of 3 mmHg. FINDINGS  Left Ventricle: Left ventricular ejection fraction, by estimation, is 60 to 65%. The left ventricle has normal function. The left ventricle has no regional wall motion abnormalities. The left ventricular internal cavity size was normal in size. There is  moderate asymmetric left ventricular hypertrophy of the basal-septal segment. Left ventricular diastolic function could not be evaluated due to atrial fibrillation. Left ventricular diastolic function could not be evaluated. Right Ventricle: The right ventricular size is normal. No increase in right ventricular wall thickness. Right ventricular systolic function is normal. There is mildly elevated pulmonary artery systolic pressure. The tricuspid regurgitant velocity is 2.94  m/s, and with an assumed right atrial pressure of 5 mmHg, the estimated right ventricular systolic pressure is 39.6 mmHg. Left Atrium: Left atrial size was moderately dilated. Right Atrium: Right atrial size was normal in size. Pericardium: There is no evidence of pericardial effusion. Mitral Valve: The mitral valve is degenerative in appearance. Severe mitral annular calcification. Mild mitral valve regurgitation. No evidence of mitral valve stenosis. MV peak gradient, 17.3 mmHg. The mean mitral valve gradient is 10.0 mmHg. Tricuspid Valve: The tricuspid valve is normal in structure. Tricuspid valve regurgitation is moderate . No evidence of tricuspid stenosis. Aortic Valve: The aortic valve is tricuspid. There is mild calcification of the aortic valve. Aortic valve regurgitation is not visualized. Aortic valve sclerosis/calcification is present, without any evidence of  aortic stenosis. Aortic valve mean gradient measures 6.0 mmHg. Aortic valve peak gradient measures 10.4 mmHg. Aortic valve area, by VTI measures 1.16 cm. Pulmonic  Valve: The pulmonic valve was normal in structure. Pulmonic valve regurgitation is trivial. No evidence of pulmonic stenosis. Aorta: The aortic root is normal in size and structure. Venous: The inferior vena cava is normal in size with greater than 50% respiratory variability, suggesting right atrial pressure of 3 mmHg. IAS/Shunts: No atrial level shunt detected by color flow Doppler.  LEFT VENTRICLE PLAX 2D LVIDd:         3.00 cm LVIDs:         2.30 cm LV PW:         1.00 cm LV IVS:        1.70 cm LVOT diam:     1.90 cm LV SV:         42 LV SV Index:   22 LVOT Area:     2.84 cm  LV Volumes (MOD) LV vol d, MOD A2C: 61.9 ml LV vol d, MOD A4C: 58.6 ml LV vol s, MOD A2C: 28.1 ml LV vol s, MOD A4C: 27.4 ml LV SV MOD A2C:     33.8 ml LV SV MOD A4C:     58.6 ml LV SV MOD BP:      33.1 ml RIGHT VENTRICLE          IVC RV Basal diam:  3.30 cm  IVC diam: 2.20 cm RV Mid diam:    3.10 cm TAPSE (M-mode): 1.0 cm LEFT ATRIUM              Index        RIGHT ATRIUM           Index LA Vol (A2C):   152.0 ml 78.54 ml/m  RA Area:     22.30 cm LA Vol (A4C):   178.0 ml 91.97 ml/m  RA Volume:   62.40 ml  32.24 ml/m LA Biplane Vol: 175.0 ml 90.42 ml/m  AORTIC VALVE                     PULMONIC VALVE AV Area (Vmax):    1.22 cm      PV Vmax:       0.85 m/s AV Area (Vmean):   1.21 cm      PV Peak grad:  2.9 mmHg AV Area (VTI):     1.16 cm AV Vmax:           161.00 cm/s AV Vmean:          121.000 cm/s AV VTI:            0.359 m AV Peak Grad:      10.4 mmHg AV Mean Grad:      6.0 mmHg LVOT Vmax:         69.40 cm/s LVOT Vmean:        51.700 cm/s LVOT VTI:          0.147 m LVOT/AV VTI ratio: 0.41  AORTA Ao Root diam: 3.10 cm Ao Asc diam:  2.90 cm MITRAL VALVE               TRICUSPID VALVE MV Area (PHT): 3.49 cm    TR Peak grad:   34.6 mmHg MV Area VTI:   1.40 cm    TR Vmax:        294.00 cm/s MV Peak grad:  17.3 mmHg MV Mean grad:  10.0 mmHg   SHUNTS MV Vmax:       2.08 m/s    Systemic VTI:  0.15 m MV Vmean:      148.0 cm/s  Systemic  Diam: 1.90 cm Arvilla Meres MD Electronically signed by Arvilla Meres MD Signature Date/Time: 10/22/2023/9:10:56 AM    Final    US RENAL Result Date: 10/22/2023 CLINICAL DATA:  Acute kidney injury. EXAM: RENAL / URINARY TRACT ULTRASOUND COMPLETE COMPARISON:  June 11, 2004 FINDINGS: Right Kidney: Renal measurements: 10.2 cm x 4.5 cm x 4.1 cm = volume: 99.99 mL. Echogenicity within normal limits. No mass is visualized. There is mild right-sided hydronephrosis. Left Kidney: Renal measurements: 10.2 cm x 4.1 cm x 4.1 cm = volume: 91.36 mL. Echogenicity within normal limits. No mass or hydronephrosis visualized. Bladder: The urinary bladder is poorly distended and subsequently limited in evaluation. Other: None. IMPRESSION: Mild right-sided hydronephrosis. Electronically Signed   By: Aram Candela M.D.   On: 10/22/2023 01:41   DG Chest Port 1 View Result Date: 10/21/2023 CLINICAL DATA:  Sepsis, vomiting EXAM: PORTABLE CHEST 1 VIEW COMPARISON:  10/12/2023 FINDINGS: Lung apices obscured by the patient's chin and facial tissues. The patient is rotated to the left on today's radiograph, reducing diagnostic sensitivity and specificity. Atherosclerotic calcification of the aortic arch. Mitral annular calcification suspected. Blunting of the left lateral costophrenic angle with left basilar airspace opacity. Appearance favors a left pleural effusion and likely associated atelectasis although left basilar pneumonia is not excluded. Bony demineralization. Faint persistent interstitial accentuation in the lungs. Old rib deformities compatible with healed fractures. Previous blunting of the right lateral costophrenic angle is no longer readily apparent. IMPRESSION: 1. Left basilar airspace opacity with blunting of the left lateral costophrenic angle. Appearance favors a left pleural effusion and likely associated atelectasis although left basilar pneumonia is not excluded. 2. Faint persistent interstitial  accentuation in the lungs. 3. Bony demineralization. 4. Aortic Atherosclerosis (ICD10-I70.0). Electronically Signed   By: Gaylyn Rong M.D.   On: 10/21/2023 17:38      Joycelyn Das, MD  [facility] 10/22/2023  If 7PM-7AM, please contact night-coverage

## 2023-10-22 NOTE — Evaluation (Signed)
 Occupational Therapy Evaluation Patient Details Name: Ana Franco MRN: [id number] DOB: [birth year] Today's Date: 10/22/2023   History of Present Illness   89 y.o. female presents to [facility]  presenting to the ED 10/21/23 after an episode of hematemesis. Sepsis, UTI, AKI, L pleural effusion, PMH includes L BKA, afib on Eliquis, HTN, CHF, CAD, CKD, TIA/CVA, HLD, MI     Clinical Impressions Pt typically stands from higher surfaces with RW and walks short distances. She is able to don her prosthesis and is assisted for showering, toileting and LB dressing. Pt uses a brief as she is incontinent. Pt received up in chair with complaints of her buttocks being sore after sitting up a while. Stood with light mod assist to rise and steady and RW, ambulated in room with CGA. Assisted with pericare and to change chair linen, pillow placed under buttocks. Pt completed grooming in chair with set up. Recommending return to ALF with HHOT.      If plan is discharge home, recommend the following:   A lot of help with walking and/or transfers;A lot of help with bathing/dressing/bathroom;Assistance with cooking/housework;Direct supervision/assist for medications management;Direct supervision/assist for financial management;Assist for transportation;Help with stairs or ramp for entrance     Functional Status Assessment   Patient has had a recent decline in their functional status and/or demonstrates limited ability to make significant improvements in function in a reasonable and predictable amount of time     Equipment Recommendations   None recommended by OT     Recommendations for Other Services         Precautions/Restrictions   Precautions Precautions: Fall Precaution/Restrictions Comments: L BKA, prosthetic in room Restrictions Weight Bearing Restrictions Per Provider Order: No     Mobility Bed Mobility               General bed mobility comments: in chair, returned to chair  with pillow under buttocks    Transfers Overall transfer level: Needs assistance Equipment used: Rolling walker (2 wheels) Transfers: Sit to/from Stand Sit to Stand: Mod assist           General transfer comment: light mod to stand and gain balance      Balance Overall balance assessment: Needs assistance, Mild deficits observed, not formally tested   Sitting balance-Leahy Scale: Fair     Standing balance support: Bilateral upper extremity supported, During functional activity, Reliant on assistive device for balance Standing balance-Leahy Scale: Poor Standing balance comment: unable to release walker in static standing for pericare                           ADL either performed or assessed with clinical judgement   ADL Overall ADL's : Needs assistance/impaired Eating/Feeding: Set up;Sitting   Grooming: Wash/dry hands;Wash/dry face;Sitting;Set up   Upper Body Bathing: Moderate assistance;Sitting   Lower Body Bathing: Total assistance;Sit to/from stand   Upper Body Dressing : Minimal assistance;Sitting   Lower Body Dressing: Total assistance;Sit to/from stand Lower Body Dressing Details (indicate cue type and reason): can don her prosthesis     Toileting- Clothing Manipulation and Hygiene: Total assistance;Sit to/from stand       Functional mobility during ADLs: Contact guard assist;Rolling walker (2 wheels)       Vision Baseline Vision/History: 4 Cataracts Ability to See in Adequate Light: 0 Adequate       Perception         Praxis  Pertinent Vitals/Pain Pain Assessment Pain Assessment: Faces Faces Pain Scale: Hurts a little bit Pain Location: buttocks Pain Descriptors / Indicators: Discomfort Pain Intervention(s): Repositioned     Extremity/Trunk Assessment Upper Extremity Assessment Upper Extremity Assessment: Right hand dominant;RUE deficits/detail RUE Deficits / Details: longstanding shoulder weakness from stroke RUE  Coordination: decreased gross motor   Lower Extremity Assessment Lower Extremity Assessment: Defer to PT evaluation LLE Deficits / Details: L BKA   Cervical / Trunk Assessment Cervical / Trunk Assessment: Kyphotic   Communication Communication Communication: No apparent difficulties Factors Affecting Communication: Hearing impaired   Cognition Arousal: Alert Behavior During Therapy: WFL for tasks assessed/performed Cognition: No family/caregiver present to determine baseline             OT - Cognition Comments: memory deficits, didn't recall eating lunch                 Following commands: Intact       Cueing  General Comments      HR 102-112   Exercises     Shoulder Instructions      Home Living Family/patient expects to be discharged to:: Assisted living                             Home Equipment: Agricultural consultant (2 wheels);Wheelchair - manual;Shower seat;Grab bars - toilet;Grab bars - tub/shower;Hand held shower head          Prior Functioning/Environment Prior Level of Function : Needs assist             Mobility Comments: ambulating short distances to/from bathroom with RW and LLE prosthetic and assist, uses lift chair to stand ADLs Comments: does not go to dining hall, typically eats in room (meals provided). staff assists with bathing on shower seat 2x/wk, LB dressing daily. pt reports assist with toileting    OT Problem List: Decreased strength;Decreased range of motion;Decreased activity tolerance;Impaired balance (sitting and/or standing);Decreased safety awareness;Decreased cognition;Impaired UE functional use   OT Treatment/Interventions: Self-care/ADL training;DME and/or AE instruction;Therapeutic activities;Patient/family education;Balance training      OT Goals(Current goals can be found in the care plan section)   Acute Rehab OT Goals OT Goal Formulation: With patient Time For Goal Achievement: 11/05/23 Potential  to Achieve Goals: Fair   OT Frequency:  Min 1X/week    Co-evaluation              AM-PAC OT "6 Clicks" Daily Activity     Outcome Measure Help from another person eating meals?: A Little Help from another person taking care of personal grooming?: A Little Help from another person toileting, which includes using toliet, bedpan, or urinal?: Total Help from another person bathing (including washing, rinsing, drying)?: A Lot Help from another person to put on and taking off regular upper body clothing?: A Little Help from another person to put on and taking off regular lower body clothing?: Total 6 Click Score: 13   End of Session Equipment Utilized During Treatment: Gait belt;Rolling walker (2 wheels)  Activity Tolerance: Patient tolerated treatment well Patient left: in chair;with call bell/phone within reach;with chair alarm set  OT Visit Diagnosis: Unsteadiness on feet (R26.81);Other abnormalities of gait and mobility (R26.89);Muscle weakness (generalized) (M62.81);Other symptoms and signs involving cognitive function                Time: 5284-1324 OT Time Calculation (min): 39 min Charges:  OT General Charges $OT Visit: 1 Visit OT Evaluation $  OT Eval Moderate Complexity: 1 Mod OT Treatments $Self Care/Home Management : 23-37 mins Berna Spare, OTR/L Acute Rehabilitation Services Office: [phone]  Evern Bio 10/22/2023, 2:35 PM

## 2023-10-22 NOTE — Hospital Course (Addendum)
 Ana Franco is a 89 y.o. female with medical history significant of chronic diastolic heart failure, CKD 3B, CAD, history of CVA, hypertension, GERD, hyperlipidemia, permanent atrial fibrillation, normocytic anemia to the hospital after an episode of hematemesis.  Nasocort after taking breakfast.  EMS was called in and was noted to have hypoxia as well and was started on nasal cannula oxygen.  In the ED patient was noted to be febrile with a temperature of 101.4 F.  Had mild leukocytosis at 13.1.  CMP with mild hyponatremia, hyperkalemia to 5.7, mild hyperglycemia, creatinine 1.8 (baseline around 1-1.1), BUN 21.  PT 18.3, INR 1.5.  Respiratory panel negative for COVID, flu, RSV.  BNP 485.  Troponins flat at 16 x 2. EKG with atrial fibrillation and ventricular bigeminy noted. Lactate 1.5.  Urinalysis with hematuria, moderate leukocytes, greater than 50 WBCs, many bacteria (though does have 6-10 squamous epithelial cells).  Chest x-ray showing likely left pleural effusion with associated atelectasis, though left basilar pneumonia unable to be excluded. Patient also given 500 cc of normal saline by ED provider.  Patient was then considered for admission to hospital for further evaluation and treatment.   Sepsis, secondary to UTI Hx of recent ESBL UTI (09/2023) Patient had TEE with leukocytosis and abnormal urinalysis with AKI.  Follow urine and blood cultures.  History of ESBL UTI in the past on 10/04/23 so has been started on meropenem.  Received IV fluids.   Hematemesis Hx of normocytic anemia Had 1 episode of hematemesis.  On Eliquis at the skilled nursing facility.  Her hemoglobin was however at baseline..  Hemoglobin today at 8.4 from around 9.9.  Will continue to monitor.  Continue Protonix twice daily.  GI was consulted and plan for conservative treatment at this time..  Continue to hold Eliquis.  Antiemetics.  Monitor hemoglobin closely.   AKI on CKD 3A Baseline Cr 1-1.1. On admission, Cr 1.80.   Creatinine today at 1.9.  Received IV fluid bolus.  Ultrasound of the kidneys shows mild right hydronephrosis.  Continue intake and output charting.  Daily weights.  Will do Bladder scan.  Left pleural effusion with associated atelectasis No respiratory symptoms.  Continue incentive spirometry.   Hyperkalemia On presentation.  Received Lokelma x 1.  Potassium today 4.4.   Mild hyponatremia Thought to be secondary to hypovolemia.  Sodium today at 132 from 131.[name]  Received IV fluids.  Permanent atrial fibrillation with ventricular bigeminy Rate controlled.  On metoprolol twice daily at home.  Eliquis on hold.  Currently metoprolol on hold as well  Chronic diastolic HF ECHO 08/6107 showing LVEF 60-65%, no RWMA, moderate LVH, indeterminate diastolic parameters, severe LA dilation, severe RA dilation. She does not appear volume overloaded on exam.  Patient is on Lasix and metoprolol at home.  Currently on hold.  Continue daily weights intake and output charting.  Hx of CVA Hyperlipidemia. Hold Eliquis.  Continue ezetimibe-simvastatin   Depression Continue Celexa.

## 2023-10-22 NOTE — Consult Note (Signed)
 Consultation  Referring Provider: [facility]/ [facility]rel Primary Care Physician:  Sherron Monday, MD Primary Gastroenterologist:  unassigned  Reason for Consultation:  dark emesis, anemia  HPI: Ana Franco is a 89 y.o. female who was admitted yesterday from her nursing facility/[facility] after she developed fever yesterday and possible hematemesis with an episode of very dark emesis.  Patient has history of atrial fibrillation, is on chronic Eliquis, diastolic congestive heart failure, coronary artery disease, chronic kidney disease stage III, chronic anemia/B12 deficiency, history of GERD, hyperlipidemia.  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 She had a recent admission here with acute kidney injury, COVID-19 infection and also had ESBL UTI at that time. Patient says that she noted that she was feeling little short of breath over the past couple of days and had not had a good appetite.  Yesterday she developed a fever, and then had 1 episode of vomiting which she says was dark but no obvious blood.  That was the first time she had vomited and the first time she had brought up anything dark.  She has not had a bowel movement for couple of days but does not recall any melena or hematochezia prior to that.  She has no complaints of abdominal pain.  No complaints of heartburn or indigestion though she is on Nexium daily chronically, no dysphagia or odynophagia. She believes that she had remote GI evaluation but nothing in the past 15 years or so. Her med list has aspirin listed on it but she says she only takes Tylenol at the nursing facility on a as needed basis.  Her baseline hemoglobin is in the 10 range. On admission yesterday WBC 13.1/hemoglobin 9.9/hematocrit 31.0 INR 1.5 BNP 485 Creatinine 1.8  COVID PCR is positive  Labs today BUN 23/creatinine 1.94 WBC 8.7/hemoglobin 8.4/hematocrit 26.3  Chest x-ray yesterday shows left pleural effusion possible left lower  lobe pneumonia UA was also abnormal and cultures pending  She feels better today, no complaints of nausea and would like to try something to eat.   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)    Chronic venous insufficiency    Depression    Difficulty in walking    Displaced trimalleolar fracture of left lower leg    Essential (primary) hypertension    GERD (gastroesophageal reflux disease)    History of blood transfusion    02/2018    History of falling    Hyperlipidemia    Hyperlipidemia    Hypertension    Infection and inflammatory reaction due to other internal orthopedic prosthetic devices, implants and grafts, subsequent encounter    Iron deficiency    Major depressive disorder    Muscle weakness (generalized)    Myocardial infarction (HCC)    nonstemi mi - 02/19/2018 at [facility] after ankle fracture    Nutritional deficiency, unspecified    Obesity    Other lack of coordination    Unspecified atrial fibrillation (HCC)    Unspecified osteoarthritis, unspecified site     Past Surgical History:  Procedure Laterality Date   AMPUTATION Left 04/10/2018   Procedure: LEFT BELOW KNEE AMPUTATION;  Surgeon: Nadara Mustard, MD;  Location: [facility] OR;  Service: Orthopedics;  Laterality: Left;   APPENDECTOMY     APPLICATION OF WOUND VAC Left 03/31/2018   Procedure: APPLICATION OF WOUND  VAC;  Surgeon: Sheral Apley, MD;  Location: [facility] ORS;  Service: Orthopedics;  Laterality: Left;   HARDWARE REMOVAL Left 03/31/2018   Procedure: HARDWARE REMOVAL;  Surgeon: Sheral Apley, MD;  Location: [facility] ORS;  Service: Orthopedics;  Laterality: Left;   IRRIGATION AND DEBRIDEMENT FOOT Left 03/31/2018   Procedure: IRRIGATION AND DEBRIDEMENT OF LEFT ANKLE;  Surgeon: Sheral Apley, MD;  Location: [facility] ORS;  Service: Orthopedics;  Laterality: Left;   ORIF ANKLE FRACTURE Right  06/13/2014   Procedure: OPEN REDUCTION INTERNAL FIXATION (ORIF) BIMALLEOLAR ANKLE FRACTURE;  Surgeon: Loreta Ave, MD;  Location[facility] OR;  Service: Orthopedics;  Laterality: Right;    Prior to Admission medications   Medication Sig Start Date End Date Taking? Authorizing Provider  Acetaminophen 325 MG CHEW Chew 650 mg by mouth every 6 (six) hours as needed (pain, fever, headache).   Yes [provider]  apixaban (ELIQUIS) 5 MG TABS tablet Take 1 tablet (5 mg total) by mouth 2 (two) times daily. 01/09/21  Yes Tyrone Nine, MD  beta carotene 40347 UNIT capsule Take 25,000 Units by mouth daily. 12/07/21  Yes [provider]  CALCIUM+D3 500-10 MG-MCG TABS Take 1 tablet by mouth in the morning. 12/16/22  Yes [provider]  citalopram (CELEXA) 20 MG tablet Take 10 mg by mouth daily.   Yes [provider]  clotrimazole (LOTRIMIN) 1 % cream Apply 1 Application topically 2 (two) times daily. Apply to right foot 04/29/23  Yes [provider]  esomeprazole (NEXIUM) 40 MG capsule Take 40 mg by mouth daily.    Yes [provider]  ezetimibe-simvastatin (VYTORIN) 10-20 MG tablet Take 1 tablet by mouth every evening. 11/17/21  Yes [provider]  furosemide (LASIX) 20 MG tablet Take 1 tablet (20 mg total) by mouth daily. 10/14/23  Yes Mdala-Gausi, Gwenette Greet, MD  gabapentin (NEURONTIN) 100 MG capsule Take 100 mg by mouth 3 (three) times daily. 11/28/21  Yes [provider]  metoprolol tartrate (LOPRESSOR) 25 MG tablet Take 1 tablet (25 mg total) by mouth 2 (two) times daily. Hold for SBP < 105 or HR < 60 12/16/22  Yes Danford, Earl Lites, MD  potassium chloride (KLOR-CON) 20 MEQ packet Take 20 mEq by mouth in the morning.   Yes [provider]  vitamin B-12 (CYANOCOBALAMIN) 500 MCG tablet Take 500 mcg by mouth every morning.   Yes [provider]    Current Facility-Administered Medications  Medication Dose Route  Frequency Provider Last Rate Last Admin   acetaminophen (TYLENOL) tablet 650 mg  650 mg Oral Q6H PRN Briscoe Burns, MD   650 mg at 10/21/23 2331   Or   acetaminophen (TYLENOL) suppository 650 mg  650 mg Rectal Q6H PRN Briscoe Burns, MD       citalopram (CELEXA) tablet 10 mg  10 mg Oral Daily Briscoe Burns, MD       ezetimibe (ZETIA) tablet 10 mg  10 mg Oral Daily Briscoe Burns, MD       And   simvastatin (ZOCOR) tablet 20 mg  20 mg Oral q1800 Briscoe Burns, MD       meropenem (MERREM) 500 mg in sodium chloride 0.9 % 100 mL IVPB  500 mg Intravenous Q12H Briscoe Burns, MD 200 mL/hr at 10/22/23 0653 Rate Change at 10/22/23 0653   ondansetron (ZOFRAN) tablet 4 mg  4 mg Oral Q6H PRN Briscoe Burns, MD  Or   ondansetron (ZOFRAN) injection 4 mg  4 mg Intravenous Q6H PRN Briscoe Burns, MD       pantoprazole (PROTONIX) injection 40 mg  40 mg Intravenous Q12H Briscoe Burns, MD   40 mg at 10/22/23 0911    Allergies as of 10/21/2023   (No Known Allergies)    History reviewed. No pertinent family history.  Social History   Socioeconomic History   Marital status: Widowed    Spouse name: Not on file   Number of children: Not on file   Years of education: Not on file   Highest education level: Not on file  Occupational History   Not on file  Tobacco Use   Smoking status: Never   Smokeless tobacco: Never  Vaping Use   Vaping status: Never Used  Substance and Sexual Activity   Alcohol use: No   Drug use: No   Sexual activity: Not Currently  Other Topics Concern   Not on file  Social History Narrative   Not on file   Social Drivers of Health   Financial Resource Strain: Not on file  Food Insecurity: No Food Insecurity (10/12/2023)   Hunger Vital Sign    Worried About Running Out of Food in the Last Year: Never true    Ran Out of Food in the Last Year: Never true  Transportation Needs: No Transportation Needs (10/12/2023)   PRAPARE - Doctor, general practice (Medical): No    Lack of Transportation (Non-Medical): No  Physical Activity: Not on file  Stress: Not on file  Social Connections: Moderately Isolated (10/12/2023)   Social Connection and Isolation Panel [NHANES]    Frequency of Communication with Friends and Family: Three times a week    Frequency of Social Gatherings with Friends and Family: Three times a week    Attends Religious Services: Never    Active Member of Clubs or Organizations: Yes    Attends Banker Meetings: Never    Marital Status: Widowed  Intimate Partner Violence: Not At Risk (10/12/2023)   Humiliation, Afraid, Rape, and Kick questionnaire    Fear of Current or Ex-Partner: No    Emotionally Abused: No    Physically Abused: No    Sexually Abused: No    Review of Systems: Pertinent positive and negative review of systems were noted in the above HPI section.  All other review of systems was otherwise negative.   Physical Exam: Vital signs in last 24 hours: Temp:  [97.9 F (36.6 C)-101.4 F (38.6 C)] 98 F (36.7 C) (03/12 0841) Pulse Rate:  [35-87] 35 (03/12 0840) Resp:  [11-22] 11 (03/12 0840) BP: (98-153)/(48-66) 153/60 (03/12 0833) SpO2:  [92 %-97 %] 95 % (03/12 0840) Weight:  [77.1 kg-79.8 kg] 79.8 kg (03/12 0454) Last BM Date : 10/20/23 General:   Alert,  Well-developed, well-nourished, very elderly white female pleasant and cooperative in NAD Head:  Normocephalic and atraumatic. Eyes:  Sclera clear, no icterus.   Conjunctiva pink. Ears:  Normal auditory acuity. Nose:  No deformity, discharge,  or lesions. Mouth:  No deformity or lesions.   Neck:  Supple; no masses or thyromegaly. Lungs:   Clear throughout to auscultation.   No wheezing, decreased breath sounds at the bases . Heart:  Regular rate and rhythm; no murmurs, clicks, rubs,  or gallops. Abdomen:  Soft,nontender, BS active,nonpalp mass or hsm.   Rectal: Not done Msk:  Symmetrical without gross deformities.  . Pulses:  Normal  pulses noted. Extremities: Status post left BKA Neurologic:  Alert and  oriented x4;  grossly normal neurologically. Skin:  Intact without significant lesions or rashes.. Psych:  Alert and cooperative. Normal mood and affect.  Intake/Output from previous day: 03/11 0701 - 03/12 0700 In: 280 [P.O.:180; IV Piggyback:100] Out: 240 [Urine:240] Intake/Output this shift: No intake/output data recorded.  Lab Results: Recent Labs    10/21/23 1603 10/22/23 0444  WBC 13.1* 8.7  HGB 9.9* 8.4*  HCT 31.0* 26.2*  PLT 180 163   BMET Recent Labs    10/21/23 1603 10/22/23 0444  NA 131* 132*  K 5.7* 4.4  CL 96* 99  CO2 21* 23  GLUCOSE 107* 100*  BUN 21 23  CREATININE 1.80* 1.94*  CALCIUM 8.4* 8.0*   LFT Recent Labs    10/21/23 1603  PROT 6.3*  ALBUMIN 3.0*  AST 40  ALT 15  ALKPHOS 57  BILITOT 1.4*   PT/INR Recent Labs    10/21/23 1603  LABPROT 18.3*  INR 1.5*   Hepatitis Panel No results for input(s): "HEPBSAG", "HCVAB", "HEPAIGM", "HEPBIGM" in the last 72 hours.  IMPRESSION:  #56 89 year old white female admitted from skilled nursing facility with fever documented at 101.4 in the ER, 1 episode of nausea and vomiting yesterday which was reported as dark, patient also feeling short of breath over the past couple days.  Workup thus far with finding of a left pleural effusion possible left lower lobe pneumonia and abnormal UA with culture in progress. She has history of a recent ESBL UTI.  Also recent COVID-19 infection, and recent hospitalization for CHF exacerbation.  She has not had any further vomiting or coffee-ground emesis, no melena or hematochezia. Hemodynamically has been stable though O2 sats borderline at 89-90 on room air  Hemoglobin drifted from 9.9 on admission to 8.7 this morning this may be multifactoral  #2 history of GERD on chronic PPI as an outpatient #3 history of atrial fibrillation-on Eliquis #4 history of diastolic  congestive heart failure with recent admission with CHF exacerbation last echo showed EF 60 to 65%  #5 history of chronic anemia, B12 deficiency-baseline hemoglobin around 10 #6 chronic kidney disease #7 coronary artery disease  Plan; full liquid diet today Continue IV PPI twice daily Hold Eliquis Trend hemoglobin every 8 hours and transfuse as indicated for hemoglobin less than 8 given her very advanced age and multiple comorbidities Will not plan EGD at present, will help to manage conservatively, but if she manifests evidence of further active bleeding or significant drop in hemoglobin then EGD may be needed. GI will follow with you      Ensley Blas PA-C 10/22/2023, 9:46 AM

## 2023-10-22 NOTE — TOC Initial Note (Signed)
 Transition of Care [facility]) - Initial/Assessment Note    Patient Details  Name: Ana Franco MRN: [id number] Date of Birth: 12/02/1925  Transition of Care [facility]) CM/SW Contact:    Mearl Latin, LCSW Phone Number: 10/22/2023, 2:20 PM  Clinical Narrative:                 Patient admitted from [facility]. CSW provided update to facility and will continue to follow.  Expected Discharge Plan: Assisted Living Barriers to Discharge: Continued Medical Work up   Patient Goals and CMS Choice            Expected Discharge Plan and Services In-house Referral: Clinical Social Work   Post Acute Care Choice: Resumption of Svcs/PTA Provider Living arrangements for the past 2 months: Assisted Living Facility                                      Prior Living Arrangements/Services Living arrangements for the past 2 months: Assisted Living Facility Lives with:: Facility Resident Patient language and need for interpreter reviewed:: Yes Do you feel safe going back to the place where you live?: Yes      Need for Family Participation in Patient Care: Yes (Comment) Care giver support system in place?: Yes (comment)   Criminal Activity/Legal Involvement Pertinent to Current Situation/Hospitalization: No - Comment as needed  Activities of Daily Living   ADL Screening (condition at time of admission) Independently performs ADLs?: No Does the patient have a NEW difficulty with bathing/dressing/toileting/self-feeding that is expected to last >3 days?: No Does the patient have a NEW difficulty with getting in/out of bed, walking, or climbing stairs that is expected to last >3 days?: No Does the patient have a NEW difficulty with communication that is expected to last >3 days?: No Is the patient deaf or have difficulty hearing?: No Does the patient have difficulty seeing, even when wearing glasses/contacts?: No Does the patient have difficulty concentrating, remembering, or making  decisions?: Yes  Permission Sought/Granted Permission sought to share information with : Facility Medical sales representative, Family Supports Permission granted to share information with : Yes, Verbal Permission Granted  Share Information with NAME: Brett Canales  Permission granted to share info w AGENCY: [facility]  Permission granted to share info w Relationship: Son  Permission granted to share info w Contact Information: [phone]  Emotional Assessment Appearance:: Appears stated age Attitude/Demeanor/Rapport: Gracious Affect (typically observed): Accepting Orientation: : Oriented to Self, Oriented to Place, Oriented to  Time, Oriented to Situation Alcohol / Substance Use: Not Applicable Psych Involvement: No (comment)  Admission diagnosis:  AKI (acute kidney injury) (HCC) [N17.9] Sepsis (HCC) [A41.9] Sepsis, due to unspecified organism, unspecified whether acute organ dysfunction present [facility]) [A41.9] Patient Active Problem List   Diagnosis Date Noted   Sepsis (HCC) 10/21/2023   Hematemesis 10/21/2023   Hyperkalemia 10/21/2023   Acute on chronic HFrEF (heart failure with reduced ejection fraction) (HCC) 10/12/2023   Acute CHF (congestive heart failure) (HCC) 10/12/2023   ARF (acute renal failure) (HCC) 10/05/2023   SIRS (systemic inflammatory response syndrome) (HCC) 10/04/2023   Anemia of chronic disease 10/04/2023   COVID-19 virus antibody detected 10/04/2023   Acute kidney injury superimposed on stage 3a chronic kidney disease (HCC) 10/03/2023   Hypokalemia 12/15/2022   Acute diastolic CHF (congestive heart failure) (HCC) 12/14/2022   Acute hyponatremia 12/14/2022   Normocytic anemia 12/14/2022   Coronary artery disease 12/14/2022  Hx of BKA, left (HCC) 12/14/2022   Permanent atrial fibrillation (HCC) 12/18/2021   H/O: CVA (cerebrovascular accident) 12/18/2021   History of non-ST elevation myocardial infarction (NSTEMI) 12/18/2021   Acute CVA (cerebrovascular accident)  (HCC) 01/07/2021   Thrombocytopenia (HCC) 01/06/2021   TIA (transient ischemic attack) 01/06/2021   DNR (do not resuscitate) 01/06/2021   Chronic kidney disease CKD IIIb 01/06/2021   Paroxysmal atrial fibrillation (HCC) 05/04/2018   Gangrene of lower extremity (HCC)    Subacute osteomyelitis, left ankle and foot (HCC)    S/P ORIF (open reduction internal fixation) fracture 04/03/2018   Wound infection 04/02/2018   MRSA (methicillin resistant staph aureus) culture positive 04/02/2018   Acute blood loss anemia 04/02/2018   Closed left ankle fracture, with delayed healing, subsequent encounter 03/31/2018   Essential hypertension 06/23/2014   HLD (hyperlipidemia) 06/23/2014   GERD (gastroesophageal reflux disease) 06/23/2014   UTI (urinary tract infection) 06/23/2014   Depression 06/23/2014   PCP:  Sherron Monday, MD Pharmacy:   [facility] [address] [place] [address] Phone: [phone] Fax: [phone]     Social Drivers of Health (SDOH) Social History: SDOH Screenings   Food Insecurity: No Food Insecurity (10/12/2023)  Housing: Low Risk  (10/12/2023)  Transportation Needs: No Transportation Needs (10/12/2023)  Utilities: Not At Risk (10/12/2023)  Social Connections: Moderately Isolated (10/12/2023)  Tobacco Use: Low Risk  (10/21/2023)   SDOH Interventions:     Readmission Risk Interventions     No data to display

## 2023-10-23 DIAGNOSIS — D649 Anemia, unspecified: Secondary | ICD-10-CM | POA: Diagnosis not present

## 2023-10-23 DIAGNOSIS — A419 Sepsis, unspecified organism: Secondary | ICD-10-CM | POA: Diagnosis not present

## 2023-10-23 DIAGNOSIS — I4891 Unspecified atrial fibrillation: Secondary | ICD-10-CM

## 2023-10-23 DIAGNOSIS — Z7901 Long term (current) use of anticoagulants: Secondary | ICD-10-CM | POA: Diagnosis not present

## 2023-10-23 LAB — COMPREHENSIVE METABOLIC PANEL
ALT: 10 U/L (ref 0–44)
AST: 19 U/L (ref 15–41)
Albumin: 2.5 g/dL — ABNORMAL LOW (ref 3.5–5.0)
Alkaline Phosphatase: 50 U/L (ref 38–126)
Anion gap: 7 (ref 5–15)
BUN: 20 mg/dL (ref 8–23)
CO2: 24 mmol/L (ref 22–32)
Calcium: 8.4 mg/dL — ABNORMAL LOW (ref 8.9–10.3)
Chloride: 106 mmol/L (ref 98–111)
Creatinine, Ser: 1.62 mg/dL — ABNORMAL HIGH (ref 0.44–1.00)
GFR, Estimated: 29 mL/min — ABNORMAL LOW (ref >60)
Glucose, Bld: 101 mg/dL — ABNORMAL HIGH (ref 70–99)
Potassium: 4.2 mmol/L (ref 3.5–5.1)
Sodium: 137 mmol/L (ref 135–145)
Total Bilirubin: 0.6 mg/dL (ref 0.0–1.2)
Total Protein: 5.6 g/dL — ABNORMAL LOW (ref 6.5–8.1)

## 2023-10-23 LAB — URINE CULTURE: Culture: 50000 — AB

## 2023-10-23 LAB — HEMOGLOBIN AND HEMATOCRIT, BLOOD
HCT: 26.7 % — ABNORMAL LOW (ref 36.0–46.0)
HCT: 28.3 % — ABNORMAL LOW (ref 36.0–46.0)
Hemoglobin: 8.7 g/dL — ABNORMAL LOW (ref 12.0–15.0)
Hemoglobin: 9 g/dL — ABNORMAL LOW (ref 12.0–15.0)

## 2023-10-23 LAB — MAGNESIUM: Magnesium: 1.9 mg/dL (ref 1.7–2.4)

## 2023-10-23 MED ORDER — APIXABAN 2.5 MG PO TABS
2.5000 mg | ORAL_TABLET | Freq: Two times a day (BID) | ORAL | Status: DC
  Administered 2023-10-23: 2.5 mg via ORAL
  Filled 2023-10-23: qty 1

## 2023-10-23 MED ORDER — PANTOPRAZOLE SODIUM 40 MG PO TBEC: 40.0000 mg | DELAYED_RELEASE_TABLET | Freq: Two times a day (BID) | ORAL | Status: DC

## 2023-10-23 MED ORDER — FOSFOMYCIN TROMETHAMINE 3 G PO PACK: 3.0000 g | PACK | Freq: Once | ORAL | Status: AC

## 2023-10-23 MED ORDER — APIXABAN 2.5 MG PO TABS: 2.5000 mg | ORAL_TABLET | Freq: Two times a day (BID) | ORAL | Status: DC

## 2023-10-23 MED ORDER — FOSFOMYCIN TROMETHAMINE 3 G PO PACK
3.0000 g | PACK | ORAL | Status: DC
  Administered 2023-10-23: 3 g via ORAL
  Filled 2023-10-23: qty 3

## 2023-10-23 MED ORDER — ESOMEPRAZOLE MAGNESIUM 40 MG PO CPDR: DELAYED_RELEASE_CAPSULE | ORAL | Status: AC

## 2023-10-23 NOTE — Progress Notes (Addendum)
   10/23/23 0956  Mobility  Activity Ambulated with assistance in hallway  Level of Assistance Contact guard assist, steadying assist (ModA for STS)  Assistive Device Front wheel walker  Distance Ambulated (ft) 50 ft  Activity Response Tolerated fair  Mobility Referral Yes  Mobility visit 1 Mobility  Mobility Specialist Start Time (ACUTE ONLY) [id number]  Mobility Specialist Stop Time (ACUTE ONLY) 0956  Mobility Specialist Time Calculation (min) (ACUTE ONLY) 27 min   Mobility Specialist: Progress Note - Visits:3  Pre-Mobility:      HR 102 During Mobility: HR 140s Post-Mobility:    HR 93 Pt agreeable to mobility session - received in bed. C/o LUE shoulder and forearm "hurts." Returned to chair with all needs met - call bell within reach. Chair alarm on. Pt able to don prosthetic ind.   Pt agreeable to mobility session - received in chair. C/o same sx as before. Returned to chair with all needs met - call bell within reach.  Pt agreeable to mobility session- RN request pt be put back to bed awaiting PTAR - received in chair. Pt was asymptomatic throughout session with no complaints.Returned to chair with all needs met - call bell within reach. Bed alarm on.   Barnie Mort, BS Mobility Specialist Please contact via SecureChat or  Rehab office at [phone].

## 2023-10-23 NOTE — Progress Notes (Signed)
 [facility] ROUNDING NOTE   Subjective: Patient feeling poorly today, no specific complaints.  No vomiting since the single episode back at her nursing facility.  She states that her vomit appeared green in color, nonbloody.  She had a nonbloody stool yesterday.  Her hemoglobin has essentially been stable.   Objective: Vital signs in last 24 hours: Temp:  [97.7 F (36.5 C)-98.2 F (36.8 C)] 97.9 F (36.6 C) (03/13 0802) Pulse Rate:  [80-95] 80 (03/13 0802) Resp:  [13-26] 13 (03/13 0802) BP: (143-176)/(55-104) 171/64 (03/13 0802) SpO2:  [89 %-98 %] 93 % (03/13 0802) Weight:  [81.4 kg] 81.4 kg (03/13 0445) Last BM Date : 10/20/23 General: NAD, pleasant elderly Caucasian female, lying in bed Lungs:  CTA b/l, no w/r/r Heart:  RRR, no m/r/g Abdomen:  Soft, NT, ND, +BS Ext:  No c/c/e, status post left BKA    Intake/Output from previous day: 03/12 0701 - 03/13 0700 In: 480 [P.O.:480] Out: 100 [Urine:100] Intake/Output this shift: Total I/O In: -  Out: 400 [Urine:400]   Lab Results: Recent Labs    10/21/23 1603 10/22/23 0444 10/22/23 1151 10/22/23 1957 10/23/23 0456  WBC 13.1* 8.7  --   --   --   HGB 9.9* 8.4* 8.6* 9.0* 8.7*  PLT 180 163  --   --   --   MCV 92.5 91.6  --   --   --    BMET Recent Labs    10/21/23 1603 10/22/23 0444 10/23/23 0456  NA 131* 132* 137  K 5.7* 4.4 4.2  CL 96* 99 106  CO2 21* 23 24  GLUCOSE 107* 100* 101*  BUN 21 23 20   CREATININE 1.80* 1.94* 1.62*  CALCIUM 8.4* 8.0* 8.4*   LFT Recent Labs    10/21/23 1603 10/23/23 0456  PROT 6.3* 5.6*  ALBUMIN 3.0* 2.5*  AST 40 19  ALT 15 10  ALKPHOS 57 50  BILITOT 1.4* 0.6   PT/INR Recent Labs    10/21/23 1603  INR 1.5*      Imaging/Other results: ECHOCARDIOGRAM COMPLETE Result Date: 10/22/2023    ECHOCARDIOGRAM REPORT   Patient Name:   Ana Franco Date of Exam: 10/22/2023 Medical Rec #:  [id number]      Height:       68.0 in Accession #:    [id number]      Weight:       175.9 lb Date of Birth:  April 16, 1926      BSA:          1.935 m Patient Age:    89 years       BP:           139/56 mmHg Patient Gender: F              HR:           40 bpm. Exam Location:  Inpatient Procedure: 2D Echo, Cardiac Doppler and Color Doppler (Both Spectral and Color            Flow Doppler were utilized during procedure). Indications:    CHF  History:        Patient has prior history of Echocardiogram examinations, most                 recent 12/14/2022. Stroke, Arrythmias:Atrial Fibrillation; Risk                 Factors:Hypertension.  Sonographer:    Amy Chionchio Referring Phys: [id number] Briscoe Burns IMPRESSIONS  1. Left ventricular ejection fraction, by estimation, is 60 to 65%. The left ventricle has normal function. The left ventricle has no regional wall motion abnormalities. There is moderate asymmetric left ventricular hypertrophy of the basal-septal segment. Left ventricular diastolic function could not be evaluated.  2. Right ventricular systolic function is normal. The right ventricular size is normal. There is mildly elevated pulmonary artery systolic pressure. The estimated right ventricular systolic pressure is 39.6 mmHg.  3. Left atrial size was moderately dilated.  4. The mitral valve is degenerative. Mild mitral valve regurgitation. No evidence of mitral stenosis. Severe mitral annular calcification.  5. Tricuspid valve regurgitation is moderate.  6. The aortic valve is tricuspid. There is mild calcification of the aortic valve. Aortic valve regurgitation is not visualized. Aortic valve sclerosis/calcification is present, without any evidence of aortic stenosis.  7. The inferior vena cava is normal in size with greater than 50% respiratory variability, suggesting right atrial pressure of 3 mmHg. FINDINGS  Left Ventricle: Left ventricular ejection fraction, by estimation, is 60 to 65%. The left ventricle has normal function. The left ventricle has no regional wall motion  abnormalities. The left ventricular internal cavity size was normal in size. There is  moderate asymmetric left ventricular hypertrophy of the basal-septal segment. Left ventricular diastolic function could not be evaluated due to atrial fibrillation. Left ventricular diastolic function could not be evaluated. Right Ventricle: The right ventricular size is normal. No increase in right ventricular wall thickness. Right ventricular systolic function is normal. There is mildly elevated pulmonary artery systolic pressure. The tricuspid regurgitant velocity is 2.94  m/s, and with an assumed right atrial pressure of 5 mmHg, the estimated right ventricular systolic pressure is 39.6 mmHg. Left Atrium: Left atrial size was moderately dilated. Right Atrium: Right atrial size was normal in size. Pericardium: There is no evidence of pericardial effusion. Mitral Valve: The mitral valve is degenerative in appearance. Severe mitral annular calcification. Mild mitral valve regurgitation. No evidence of mitral valve stenosis. MV peak gradient, 17.3 mmHg. The mean mitral valve gradient is 10.0 mmHg. Tricuspid Valve: The tricuspid valve is normal in structure. Tricuspid valve regurgitation is moderate . No evidence of tricuspid stenosis. Aortic Valve: The aortic valve is tricuspid. There is mild calcification of the aortic valve. Aortic valve regurgitation is not visualized. Aortic valve sclerosis/calcification is present, without any evidence of aortic stenosis. Aortic valve mean gradient measures 6.0 mmHg. Aortic valve peak gradient measures 10.4 mmHg. Aortic valve area, by VTI measures 1.16 cm. Pulmonic Valve: The pulmonic valve was normal in structure. Pulmonic valve regurgitation is trivial. No evidence of pulmonic stenosis. Aorta: The aortic root is normal in size and structure. Venous: The inferior vena cava is normal in size with greater than 50% respiratory variability, suggesting right atrial pressure of 3 mmHg. IAS/Shunts:  No atrial level shunt detected by color flow Doppler.  LEFT VENTRICLE PLAX 2D LVIDd:         3.00 cm LVIDs:         2.30 cm LV PW:         1.00 cm LV IVS:        1.70 cm LVOT diam:     1.90 cm LV SV:         42 LV SV Index:   22 LVOT Area:     2.84 cm  LV Volumes (MOD) LV vol d, MOD A2C: 61.9 ml LV vol d, MOD A4C: 58.6 ml LV vol s, MOD A2C: 28.1 ml LV  vol s, MOD A4C: 27.4 ml LV SV MOD A2C:     33.8 ml LV SV MOD A4C:     58.6 ml LV SV MOD BP:      33.1 ml RIGHT VENTRICLE          IVC RV Basal diam:  3.30 cm  IVC diam: 2.20 cm RV Mid diam:    3.10 cm TAPSE (M-mode): 1.0 cm LEFT ATRIUM              Index        RIGHT ATRIUM           Index LA Vol (A2C):   152.0 ml 78.54 ml/m  RA Area:     22.30 cm LA Vol (A4C):   178.0 ml 91.97 ml/m  RA Volume:   62.40 ml  32.24 ml/m LA Biplane Vol: 175.0 ml 90.42 ml/m  AORTIC VALVE                     PULMONIC VALVE AV Area (Vmax):    1.22 cm      PV Vmax:       0.85 m/s AV Area (Vmean):   1.21 cm      PV Peak grad:  2.9 mmHg AV Area (VTI):     1.16 cm AV Vmax:           161.00 cm/s AV Vmean:          121.000 cm/s AV VTI:            0.359 m AV Peak Grad:      10.4 mmHg AV Mean Grad:      6.0 mmHg LVOT Vmax:         69.40 cm/s LVOT Vmean:        51.700 cm/s LVOT VTI:          0.147 m LVOT/AV VTI ratio: 0.41  AORTA Ao Root diam: 3.10 cm Ao Asc diam:  2.90 cm MITRAL VALVE               TRICUSPID VALVE MV Area (PHT): 3.49 cm    TR Peak grad:   34.6 mmHg MV Area VTI:   1.40 cm    TR Vmax:        294.00 cm/s MV Peak grad:  17.3 mmHg MV Mean grad:  10.0 mmHg   SHUNTS MV Vmax:       2.08 m/s    Systemic VTI:  0.15 m MV Vmean:      148.0 cm/s  Systemic Diam: 1.90 cm Arvilla Meres MD Electronically signed by Arvilla Meres MD Signature Date/Time: 10/22/2023/9:10:56 AM    Final    US RENAL Result Date: 10/22/2023 CLINICAL DATA:  Acute kidney injury. EXAM: RENAL / URINARY TRACT ULTRASOUND COMPLETE COMPARISON:  June 11, 2004 FINDINGS: Right Kidney: Renal measurements: 10.2 cm  x 4.5 cm x 4.1 cm = volume: 99.99 mL. Echogenicity within normal limits. No mass is visualized. There is mild right-sided hydronephrosis. Left Kidney: Renal measurements: 10.2 cm x 4.1 cm x 4.1 cm = volume: 91.36 mL. Echogenicity within normal limits. No mass or hydronephrosis visualized. Bladder: The urinary bladder is poorly distended and subsequently limited in evaluation. Other: None. IMPRESSION: Mild right-sided hydronephrosis. Electronically Signed   By: Aram Candela M.D.   On: 10/22/2023 01:41   DG Chest Port 1 View Result Date: 10/21/2023 CLINICAL DATA:  Sepsis, vomiting EXAM: PORTABLE CHEST 1 VIEW COMPARISON:  10/12/2023 FINDINGS: Lung apices  obscured by the patient's chin and facial tissues. The patient is rotated to the left on today's radiograph, reducing diagnostic sensitivity and specificity. Atherosclerotic calcification of the aortic arch. Mitral annular calcification suspected. Blunting of the left lateral costophrenic angle with left basilar airspace opacity. Appearance favors a left pleural effusion and likely associated atelectasis although left basilar pneumonia is not excluded. Bony demineralization. Faint persistent interstitial accentuation in the lungs. Old rib deformities compatible with healed fractures. Previous blunting of the right lateral costophrenic angle is no longer readily apparent. IMPRESSION: 1. Left basilar airspace opacity with blunting of the left lateral costophrenic angle. Appearance favors a left pleural effusion and likely associated atelectasis although left basilar pneumonia is not excluded. 2. Faint persistent interstitial accentuation in the lungs. 3. Bony demineralization. 4. Aortic Atherosclerosis (ICD10-I70.0). Electronically Signed   By: Gaylyn Rong M.D.   On: 10/21/2023 17:38      Assessment and Plan:  89 year old female with history of atrial fibrillation on Eliquis, coronary artery disease, CKD, admitted from her nursing facility after an  episode of suspected hematemesis at her nursing facility, found to be febrile with UTI +/- pneumonia  She has not had any further episodes of vomiting.  Her stool appeared normal yesterday.  Her hemoglobin Epicyn stable.  Her BUN is not elevated.  Very low concern for upper GI bleed at this time.  Okay to restart Eliquis from GI standpoint.  Patient desiring to advance diet, will advance to soft diet. Would continue twice daily PPI while in hospital, then resume once daily at discharge. No plans for endoscopic evaluation.  GI will sign off at this time.  Please reconsult if there are further concerns for active bleeding.  Questionable hematemesis - Continue twice daily PPI while in hospital, then resume once daily as outpatient - Okay to resume Eliquis - Advance diet to soft/low fiber  Normocytic anemia - Near baseline, evidence of GI bleed  Atrial fibrillation - Okay to resume Eliquis from GI standpoint  Sepsis, secondary to UTI - Antibiotics per hospitalist  GI will sign off at this time.    Jenel Lucks, MD  10/23/2023, 9:29 AM [facility]

## 2023-10-23 NOTE — NC FL2 (Signed)
 [place] MEDICAID FL2 LEVEL OF CARE FORM     IDENTIFICATION  Patient Name: Ana Franco Birthdate: Jan 13, 1926 Sex: female Admission Date (Current Location): 10/21/2023  [facility] and [place] Number:  Producer, television/film/video and Address:  [facility], [address] [place], [place] [address]      Provider Number: [id number]  Attending Physician Name and Address:  Joycelyn Das, MD  Relative Name and Phone Number:       Current Level of Care: Hospital Recommended Level of Care: Assisted Living Facility Prior Approval Number:    Date Approved/Denied:   PASRR Number: [id number]  Discharge Plan: Other (Comment) (ALF)    Current Diagnoses: Patient Active Problem List   Diagnosis Date Noted   Sepsis (HCC) 10/21/2023   Hematemesis 10/21/2023   Hyperkalemia 10/21/2023   Acute on chronic HFrEF (heart failure with reduced ejection fraction) (HCC) 10/12/2023   Acute CHF (congestive heart failure) (HCC) 10/12/2023   ARF (acute renal failure) (HCC) 10/05/2023   SIRS (systemic inflammatory response syndrome) (HCC) 10/04/2023   Anemia of chronic disease 10/04/2023   COVID-19 virus antibody detected 10/04/2023   Acute kidney injury superimposed on stage 3a chronic kidney disease (HCC) 10/03/2023   Hypokalemia 12/15/2022   Acute diastolic CHF (congestive heart failure) (HCC) 12/14/2022   Acute hyponatremia 12/14/2022   Normocytic anemia 12/14/2022   Coronary artery disease 12/14/2022   Hx of BKA, left (HCC) 12/14/2022   Permanent atrial fibrillation (HCC) 12/18/2021   H/O: CVA (cerebrovascular accident) 12/18/2021   History of non-ST elevation myocardial infarction (NSTEMI) 12/18/2021   Acute CVA (cerebrovascular accident) (HCC) 01/07/2021   Thrombocytopenia (HCC) 01/06/2021   TIA (transient ischemic attack) 01/06/2021   DNR (do not resuscitate) 01/06/2021   Chronic kidney disease CKD IIIb 01/06/2021   Paroxysmal atrial fibrillation (HCC) 05/04/2018    Gangrene of lower extremity (HCC)    Subacute osteomyelitis, left ankle and foot (HCC)    S/P ORIF (open reduction internal fixation) fracture 04/03/2018   Wound infection 04/02/2018   MRSA (methicillin resistant staph aureus) culture positive 04/02/2018   Acute blood loss anemia 04/02/2018   Closed left ankle fracture, with delayed healing, subsequent encounter 03/31/2018   Essential hypertension 06/23/2014   HLD (hyperlipidemia) 06/23/2014   GERD (gastroesophageal reflux disease) 06/23/2014   UTI (urinary tract infection) 06/23/2014   Depression 06/23/2014    Orientation RESPIRATION BLADDER Height & Weight     Self, Situation, Place  Normal Incontinent, External catheter Weight: 179 lb 7.3 oz (81.4 kg) Height:  5\' 8"  (172.7 cm)  BEHAVIORAL SYMPTOMS/MOOD NEUROLOGICAL BOWEL NUTRITION STATUS      Continent Diet (See dc summary)  AMBULATORY STATUS COMMUNICATION OF NEEDS Skin   Limited Assist Verbally Normal                       Personal Care Assistance Level of Assistance  Bathing, Feeding, Dressing Bathing Assistance: Limited assistance Feeding assistance: Limited assistance Dressing Assistance: Limited assistance     Functional Limitations Info  Sight Sight Info: Impaired        SPECIAL CARE FACTORS FREQUENCY  PT (By licensed PT), OT (By licensed OT)     PT Frequency: 5x/week OT Frequency: 5x/week            Contractures Contractures Info: Not present    Additional Factors Info  Code Status, Allergies, Isolation Precautions Code Status Info: DNR Allergies Info: NKA     Isolation Precautions Info: ESBL  Current Medications (10/23/2023):  This is the current hospital active medication list Current Facility-Administered Medications  Medication Dose Route Frequency Provider Last Rate Last Admin   acetaminophen (TYLENOL) tablet 650 mg  650 mg Oral Q6H PRN Briscoe Burns, MD   650 mg at 10/23/23 0302   Or   acetaminophen (TYLENOL) suppository  650 mg  650 mg Rectal Q6H PRN Briscoe Burns, MD       apixaban Everlene Balls) tablet 2.5 mg  2.5 mg Oral BID Hammons, Kimberly B, RPH   2.5 mg at 10/23/23 1226   citalopram (CELEXA) tablet 10 mg  10 mg Oral Daily Briscoe Burns, MD   10 mg at 10/23/23 0932   ezetimibe (ZETIA) tablet 10 mg  10 mg Oral Daily Briscoe Burns, MD   10 mg at 10/22/23 1732   And   simvastatin (ZOCOR) tablet 20 mg  20 mg Oral q1800 Briscoe Burns, MD   20 mg at 10/22/23 1732   fosfomycin (MONUROL) packet 3 g  3 g Oral Q3 days Pokhrel, Laxman, MD   3 g at 10/23/23 1406   ondansetron (ZOFRAN) tablet 4 mg  4 mg Oral Q6H PRN Briscoe Burns, MD       Or   ondansetron [facility]) injection 4 mg  4 mg Intravenous Q6H PRN Briscoe Burns, MD       pantoprazole (PROTONIX) EC tablet 40 mg  40 mg Oral BID Hammons, Gerhard Munch, [facility]         Discharge Medications: Please see discharge summary for a list of discharge medications.  Relevant Imaging Results:  Relevant Lab Results:   Additional Information [id number]  Mearl Latin, LCSW

## 2023-10-23 NOTE — Discharge Summary (Addendum)
 Physician Discharge Summary  Ana Franco ZOX:[id number] DOB: 1925/10/16 DOA: 10/21/2023  PCP: Sherron Monday, MD  Admit date: 10/21/2023 Discharge date: 10/23/2023  Admitted From: Assisted living facility  Discharge disposition: Skilled nursing facility   Recommendations for Outpatient Follow-Up:   Follow up with your primary care provider at the skilled nursing facility in 3 to 5 days. Check CBC, BMP, magnesium in the next visit Follow-up renal ultrasound as outpatient for follow-up of mild hydronephrosis.   Discharge Diagnosis:   Principal Problem:   Sepsis (HCC) Active Problems:   HLD (hyperlipidemia)   Permanent atrial fibrillation (HCC)   H/O: CVA (cerebrovascular accident)   Hx of BKA, left (HCC)   Acute kidney injury superimposed on stage 3a chronic kidney disease (HCC)   Hematemesis   Hyperkalemia   Discharge Condition: Improved.  Diet recommendation: Soft diet  Wound care: None.  Code status: DNR   History of Present Illness:   Ana Franco is a 89 y.o. female with medical history significant of chronic diastolic heart failure, CKD 3b, CAD, history of CVA, hypertension, GERD, hyperlipidemia, permanent atrial fibrillation, normocytic anemia to the hospital after an episode of hematemesis.  Nasocort after taking breakfast.  EMS was called in and was noted to have hypoxia as well and was started on nasal cannula oxygen.  In the ED patient was noted to be febrile with a temperature of 101.4 F.  Had mild leukocytosis at 13.1.  CMP with mild hyponatremia, hyperkalemia to 5.7, mild hyperglycemia, creatinine 1.8 (baseline around 1-1.1), BUN 21.  PT 18.3, INR 1.5.  Respiratory panel negative for COVID, flu, RSV.  BNP 485.  Troponins flat at 16 x 2. EKG with atrial fibrillation and ventricular bigeminy noted. Lactate 1.5.  Urinalysis with hematuria, moderate leukocytes, greater than 50 WBCs, many bacteria (though does have 6-10 squamous epithelial cells).  Chest  x-ray showing likely left pleural effusion with associated atelectasis, though left basilar pneumonia unable to be excluded. Patient also given 500 cc of normal saline by ED provider.  Patient was then considered for admission to hospital for further evaluation and treatment.    Hospital Course:   Following conditions were addressed during hospitalization as listed below,  Sepsis, secondary to UTI Hx of recent ESBL UTI (09/2023) Pneumonia ruled out Patient had TEE with leukocytosis and abnormal urinalysis with AKI.  Received IV fluids.  Hospitalization.  Urine culture with 50,000 colonies of ESBL E. coli. History of ESBL UTI in the past on 10/04/23 so has been receiving IV meropenem since admission.  Sepsis physiology has improved including no fever or leukocytosis.  Will give 1 dose of fosfomycin 3 g followed by repeat 3 g in 3 days at the skilled nursing facility..    Hematemesis Hx of normocytic anemia Had 1 episode of hematemesis.  On Eliquis at the skilled nursing facility.  Her hemoglobin was however at baseline..  Hemoglobin today at 9.0 today from 8.4 today and has remained stable.  No further episodes.  During hospitalization patient was seen by GI who recommended conservative treatment.  At this time patient has been restarted back on Eliquis as per GI.  Plan to continue Protonix twice daily for the next 2 weeks followed by daily on discharge.     AKI on CKD 3A Baseline Cr 1-1.1. On admission, Cr 1.80.  Creatinine today at 1.6.  Initially received IV fluid bolus.  Ultrasound of the kidneys showed mild right hydronephrosis.  Continue intake and output charting.  Daily weights.  Recommend  follow-up as outpatient with renal function and ultrasound.   Left pleural effusion with associated atelectasis No respiratory symptoms.  Continue incentive spirometry on discharge.   Hyperkalemia On presentation.  Received Lokelma x 1.  Potassium today 4.2   Mild hyponatremia Resolved.  Thought to  be secondary to hypovolemia.  Sodium prior to discharge was 137.   Permanent atrial fibrillation with ventricular bigeminy Rate controlled.  On metoprolol and Eliquis which will be continued on discharge.  Chronic diastolic HF ECHO 08/6107 showing LVEF 60-65%, no RWMA, moderate LVH, indeterminate diastolic parameters, severe LA dilation, severe RA dilation. She does not appear volume overloaded on exam.  Patient is on Lasix and metoprolol at home.  Appears compensated at this time.   Hx of CVA Hyperlipidemia. Continue Eliquis, ezetimibe-simvastatin  Depression Continue Celexa.  Deconditioning debility.  Patient was seen by physical therapy who recommended skilled facility placement.  TOC was notified and plan is skilled nursing facility at this time.  Disposition.  At this time, patient is stable for disposition to skilled nursing facility.  Communicated with the patient's son prior to discharge.  Medical Consultants:   GI  Procedures:    None Subjective:   Today, patient was seen and examined at bedside.  Sitting on the bedside.  Denies any nausea vomiting fever chills or rigor.  No hematemesis.  Has been tolerating oral diet okay.  Did participate with physical therapy.  Discharge Exam:   Vitals:   10/23/23 0802 10/23/23 1118  BP: (!) 171/64 (!) 160/76  Pulse: 80 85  Resp: 13 10  Temp: 97.9 F (36.6 C) 98.1 F (36.7 C)  SpO2: 93% 93%   Vitals:   10/23/23 0400 10/23/23 0445 10/23/23 0802 10/23/23 1118  BP: (!) 164/56  (!) 171/64 (!) 160/76  Pulse: 80  80 85  Resp: 17  13 10   Temp:   97.9 F (36.6 C) 98.1 F (36.7 C)  TempSrc:   Oral Oral  SpO2: 97%  93% 93%  Weight:  81.4 kg    Height:       Body mass index is 27.29 kg/m.   General: Alert awake, not in obvious distress, elderly female Communicative HENT: pupils equally reacting to light,  No scleral pallor or icterus noted. Oral mucosa is moist.  Chest:  Clear breath sounds.  Diminished breath sounds  bilaterally. No crackles or wheezes.  CVS: S1 &S2 heard. No murmur.  Regular rate and rhythm. Abdomen: Soft, nontender, nondistended.  Bowel sounds are heard.   Extremities: No cyanosis, left lower extremity on prosthesis. Psych: Alert, awake and oriented, normal mood CNS:  No cranial nerve deficits.  Power equal in all extremities.   Skin: Warm and dry.  No rashes noted.  The results of significant diagnostics from this hospitalization (including imaging, microbiology, ancillary and laboratory) are listed below for reference.     Diagnostic Studies:   ECHOCARDIOGRAM COMPLETE Result Date: 10/22/2023    ECHOCARDIOGRAM REPORT   Patient Name:   YANITZA SHVARTSMAN Date of Exam: 10/22/2023 Medical Rec #:  [id number]      Height:       68.0 in Accession #:    [id number]     Weight:       175.9 lb Date of Birth:  Jul 11, 1926      BSA:          1.935 m Patient Age:    89 years       BP:  139/56 mmHg Patient Gender: F              HR:           40 bpm. Exam Location:  Inpatient Procedure: 2D Echo, Cardiac Doppler and Color Doppler (Both Spectral and Color            Flow Doppler were utilized during procedure). Indications:    CHF  History:        Patient has prior history of Echocardiogram examinations, most                 recent 12/14/2022. Stroke, Arrythmias:Atrial Fibrillation; Risk                 Factors:Hypertension.  Sonographer:    Amy Chionchio Referring Phys: [id number] Dimas Alexandria JINWALA IMPRESSIONS  1. Left ventricular ejection fraction, by estimation, is 60 to 65%. The left ventricle has normal function. The left ventricle has no regional wall motion abnormalities. There is moderate asymmetric left ventricular hypertrophy of the basal-septal segment. Left ventricular diastolic function could not be evaluated.  2. Right ventricular systolic function is normal. The right ventricular size is normal. There is mildly elevated pulmonary artery systolic pressure. The estimated right ventricular systolic  pressure is 39.6 mmHg.  3. Left atrial size was moderately dilated.  4. The mitral valve is degenerative. Mild mitral valve regurgitation. No evidence of mitral stenosis. Severe mitral annular calcification.  5. Tricuspid valve regurgitation is moderate.  6. The aortic valve is tricuspid. There is mild calcification of the aortic valve. Aortic valve regurgitation is not visualized. Aortic valve sclerosis/calcification is present, without any evidence of aortic stenosis.  7. The inferior vena cava is normal in size with greater than 50% respiratory variability, suggesting right atrial pressure of 3 mmHg. FINDINGS  Left Ventricle: Left ventricular ejection fraction, by estimation, is 60 to 65%. The left ventricle has normal function. The left ventricle has no regional wall motion abnormalities. The left ventricular internal cavity size was normal in size. There is  moderate asymmetric left ventricular hypertrophy of the basal-septal segment. Left ventricular diastolic function could not be evaluated due to atrial fibrillation. Left ventricular diastolic function could not be evaluated. Right Ventricle: The right ventricular size is normal. No increase in right ventricular wall thickness. Right ventricular systolic function is normal. There is mildly elevated pulmonary artery systolic pressure. The tricuspid regurgitant velocity is 2.94  m/s, and with an assumed right atrial pressure of 5 mmHg, the estimated right ventricular systolic pressure is 39.6 mmHg. Left Atrium: Left atrial size was moderately dilated. Right Atrium: Right atrial size was normal in size. Pericardium: There is no evidence of pericardial effusion. Mitral Valve: The mitral valve is degenerative in appearance. Severe mitral annular calcification. Mild mitral valve regurgitation. No evidence of mitral valve stenosis. MV peak gradient, 17.3 mmHg. The mean mitral valve gradient is 10.0 mmHg. Tricuspid Valve: The tricuspid valve is normal in structure.  Tricuspid valve regurgitation is moderate . No evidence of tricuspid stenosis. Aortic Valve: The aortic valve is tricuspid. There is mild calcification of the aortic valve. Aortic valve regurgitation is not visualized. Aortic valve sclerosis/calcification is present, without any evidence of aortic stenosis. Aortic valve mean gradient measures 6.0 mmHg. Aortic valve peak gradient measures 10.4 mmHg. Aortic valve area, by VTI measures 1.16 cm. Pulmonic Valve: The pulmonic valve was normal in structure. Pulmonic valve regurgitation is trivial. No evidence of pulmonic stenosis. Aorta: The aortic root is normal in size and structure. Venous:  The inferior vena cava is normal in size with greater than 50% respiratory variability, suggesting right atrial pressure of 3 mmHg. IAS/Shunts: No atrial level shunt detected by color flow Doppler.  LEFT VENTRICLE PLAX 2D LVIDd:         3.00 cm LVIDs:         2.30 cm LV PW:         1.00 cm LV IVS:        1.70 cm LVOT diam:     1.90 cm LV SV:         42 LV SV Index:   22 LVOT Area:     2.84 cm  LV Volumes (MOD) LV vol d, MOD A2C: 61.9 ml LV vol d, MOD A4C: 58.6 ml LV vol s, MOD A2C: 28.1 ml LV vol s, MOD A4C: 27.4 ml LV SV MOD A2C:     33.8 ml LV SV MOD A4C:     58.6 ml LV SV MOD BP:      33.1 ml RIGHT VENTRICLE          IVC RV Basal diam:  3.30 cm  IVC diam: 2.20 cm RV Mid diam:    3.10 cm TAPSE (M-mode): 1.0 cm LEFT ATRIUM              Index        RIGHT ATRIUM           Index LA Vol (A2C):   152.0 ml 78.54 ml/m  RA Area:     22.30 cm LA Vol (A4C):   178.0 ml 91.97 ml/m  RA Volume:   62.40 ml  32.24 ml/m LA Biplane Vol: 175.0 ml 90.42 ml/m  AORTIC VALVE                     PULMONIC VALVE AV Area (Vmax):    1.22 cm      PV Vmax:       0.85 m/s AV Area (Vmean):   1.21 cm      PV Peak grad:  2.9 mmHg AV Area (VTI):     1.16 cm AV Vmax:           161.00 cm/s AV Vmean:          121.000 cm/s AV VTI:            0.359 m AV Peak Grad:      10.4 mmHg AV Mean Grad:      6.0 mmHg LVOT  Vmax:         69.40 cm/s LVOT Vmean:        51.700 cm/s LVOT VTI:          0.147 m LVOT/AV VTI ratio: 0.41  AORTA Ao Root diam: 3.10 cm Ao Asc diam:  2.90 cm MITRAL VALVE               TRICUSPID VALVE MV Area (PHT): 3.49 cm    TR Peak grad:   34.6 mmHg MV Area VTI:   1.40 cm    TR Vmax:        294.00 cm/s MV Peak grad:  17.3 mmHg MV Mean grad:  10.0 mmHg   SHUNTS MV Vmax:       2.08 m/s    Systemic VTI:  0.15 m MV Vmean:      148.0 cm/s  Systemic Diam: 1.90 cm Arvilla Meres MD Electronically signed by Arvilla Meres MD Signature Date/Time: 10/22/2023/9:10:56 AM  Final    US RENAL Result Date: 10/22/2023 CLINICAL DATA:  Acute kidney injury. EXAM: RENAL / URINARY TRACT ULTRASOUND COMPLETE COMPARISON:  June 11, 2004 FINDINGS: Right Kidney: Renal measurements: 10.2 cm x 4.5 cm x 4.1 cm = volume: 99.99 mL. Echogenicity within normal limits. No mass is visualized. There is mild right-sided hydronephrosis. Left Kidney: Renal measurements: 10.2 cm x 4.1 cm x 4.1 cm = volume: 91.36 mL. Echogenicity within normal limits. No mass or hydronephrosis visualized. Bladder: The urinary bladder is poorly distended and subsequently limited in evaluation. Other: None. IMPRESSION: Mild right-sided hydronephrosis. Electronically Signed   By: Aram Candela M.D.   On: 10/22/2023 01:41   DG Chest Port 1 View Result Date: 10/21/2023 CLINICAL DATA:  Sepsis, vomiting EXAM: PORTABLE CHEST 1 VIEW COMPARISON:  10/12/2023 FINDINGS: Lung apices obscured by the patient's chin and facial tissues. The patient is rotated to the left on today's radiograph, reducing diagnostic sensitivity and specificity. Atherosclerotic calcification of the aortic arch. Mitral annular calcification suspected. Blunting of the left lateral costophrenic angle with left basilar airspace opacity. Appearance favors a left pleural effusion and likely associated atelectasis although left basilar pneumonia is not excluded. Bony demineralization. Faint  persistent interstitial accentuation in the lungs. Old rib deformities compatible with healed fractures. Previous blunting of the right lateral costophrenic angle is no longer readily apparent. IMPRESSION: 1. Left basilar airspace opacity with blunting of the left lateral costophrenic angle. Appearance favors a left pleural effusion and likely associated atelectasis although left basilar pneumonia is not excluded. 2. Faint persistent interstitial accentuation in the lungs. 3. Bony demineralization. 4. Aortic Atherosclerosis (ICD10-I70.0). Electronically Signed   By: Gaylyn Rong M.D.   On: 10/21/2023 17:38     Labs:   Basic Metabolic Panel: Recent Labs  Lab 10/21/23 1603 10/22/23 0444 10/23/23 0456  NA 131* 132* 137  K 5.7* 4.4 4.2  CL 96* 99 106  CO2 21* 23 24  GLUCOSE 107* 100* 101*  BUN 21 23 20   CREATININE 1.80* 1.94* 1.62*  CALCIUM 8.4* 8.0* 8.4*  MG  --   --  1.9   GFR Estimated Creatinine Clearance: 23.3 mL/min (A) (by C-G formula based on SCr of 1.62 mg/dL (H)). Liver Function Tests: Recent Labs  Lab 10/21/23 1603 10/23/23 0456  AST 40 19  ALT 15 10  ALKPHOS 57 50  BILITOT 1.4* 0.6  PROT 6.3* 5.6*  ALBUMIN 3.0* 2.5*   Recent Labs  Lab 10/21/23 1603  LIPASE 44   No results for input(s): "AMMONIA" in the last 168 hours. Coagulation profile Recent Labs  Lab 10/21/23 1603  INR 1.5*    CBC: Recent Labs  Lab 10/21/23 1603 10/22/23 0444 10/22/23 1151 10/22/23 1957 10/23/23 0456 10/23/23 1303  WBC 13.1* 8.7  --   --   --   --   NEUTROABS 10.7*  --   --   --   --   --   HGB 9.9* 8.4* 8.6* 9.0* 8.7* 9.0*  HCT 31.0* 26.2* 26.9* 28.2* 26.7* 28.3*  MCV 92.5 91.6  --   --   --   --   PLT 180 163  --   --   --   --    Cardiac Enzymes: No results for input(s): "CKTOTAL", "CKMB", "CKMBINDEX", "TROPONINI" in the last 168 hours. BNP: Invalid input(s): "POCBNP" CBG: No results for input(s): "GLUCAP" in the last 168 hours. D-Dimer No results for  input(s): "DDIMER" in the last 72 hours. Hgb A1c No results for  input(s): "HGBA1C" in the last 72 hours. Lipid Profile No results for input(s): "CHOL", "HDL", "LDLCALC", "TRIG", "CHOLHDL", "LDLDIRECT" in the last 72 hours. Thyroid function studies No results for input(s): "TSH", "T4TOTAL", "T3FREE", "THYROIDAB" in the last 72 hours.  Invalid input(s): "FREET3" Anemia work up No results for input(s): "VITAMINB12", "FOLATE", "FERRITIN", "TIBC", "IRON", "RETICCTPCT" in the last 72 hours. Microbiology Recent Results (from the past 240 hours)  Blood Culture (routine x 2)     Status: None (Preliminary result)   Collection Time: 10/21/23  4:03 PM   Specimen: BLOOD RIGHT ARM  Result Value Ref Range Status   Specimen Description BLOOD RIGHT ARM  Final   Special Requests   Final    BOTTLES DRAWN AEROBIC AND ANAEROBIC Blood Culture adequate volume   Culture   Final    NO GROWTH 2 DAYS Performed at [facility], [address], [place], [place] [address]    Report Status PENDING  Incomplete  Blood Culture (routine x 2)     Status: None (Preliminary result)   Collection Time: 10/21/23  4:03 PM   Specimen: BLOOD RIGHT ARM  Result Value Ref Range Status   Specimen Description BLOOD RIGHT ARM  Final   Special Requests   Final    BOTTLES DRAWN AEROBIC AND ANAEROBIC Blood Culture adequate volume   Culture   Final    NO GROWTH 2 DAYS Performed at [facility], [address], [place], [place] [address]    Report Status PENDING  Incomplete  Resp panel by RT-PCR (RSV, Flu A&B, Covid) Anterior Nasal Swab     Status: None   Collection Time: 10/21/23  4:03 PM   Specimen: Anterior Nasal Swab  Result Value Ref Range Status   SARS Coronavirus 2 by RT PCR NEGATIVE NEGATIVE Final   Influenza A by PCR NEGATIVE NEGATIVE Final   Influenza B by PCR NEGATIVE NEGATIVE Final    Comment: (NOTE) The Xpert Xpress SARS-CoV-2/FLU/RSV plus assay is intended as an aid in the diagnosis of influenza  from Nasopharyngeal swab specimens and should not be used as a sole basis for treatment. Nasal washings and aspirates are unacceptable for Xpert Xpress SARS-CoV-2/FLU/RSV testing.  Fact Sheet for Patients: BloggerCourse.com  Fact Sheet for Healthcare Providers: SeriousBroker.it  This test is not yet approved or cleared by the [place] FDA and has been authorized for detection and/or diagnosis of SARS-CoV-2 by FDA under an Emergency Use Authorization (EUA). This EUA will remain in effect (meaning this test can be used) for the duration of the COVID-19 declaration under Section 564(b)(1) of the Act, 21 U.S.C. section 360bbb-3(b)(1), unless the authorization is terminated or revoked.     Resp Syncytial Virus by PCR NEGATIVE NEGATIVE Final    Comment: (NOTE) Fact Sheet for Patients: BloggerCourse.com  Fact Sheet for Healthcare Providers: SeriousBroker.it  This test is not yet approved or cleared by the [place] FDA and has been authorized for detection and/or diagnosis of SARS-CoV-2 by FDA under an Emergency Use Authorization (EUA). This EUA will remain in effect (meaning this test can be used) for the duration of the COVID-19 declaration under Section 564(b)(1) of the Act, 21 U.S.C. section 360bbb-3(b)(1), unless the authorization is terminated or revoked.  Performed at [facility], [address] [place], [place] [address]   Urine Culture     Status: Abnormal   Collection Time: 10/21/23  4:16 PM   Specimen: Urine, Clean Catch  Result Value Ref Range Status  Specimen Description URINE, CLEAN CATCH  Final   Special Requests   Final    ADDED 2135 Performed at [facility], [address], [place], [place] [address]    Culture (A)  Final    50,000 COLONIES/mL ESCHERICHIA COLI Confirmed Extended Spectrum Beta-Lactamase Producer (ESBL).  In bloodstream  infections from ESBL organisms, carbapenems are preferred over piperacillin/tazobactam. They are shown to have a lower risk of mortality.    Report Status 10/23/2023 FINAL  Final   Organism ID, Bacteria ESCHERICHIA COLI (A)  Final      Susceptibility   Escherichia coli - MIC*    AMPICILLIN >=32 RESISTANT Resistant     CEFAZOLIN >=64 RESISTANT Resistant     CEFEPIME 16 RESISTANT Resistant     CEFTRIAXONE >=64 RESISTANT Resistant     CIPROFLOXACIN >=4 RESISTANT Resistant     GENTAMICIN >=16 RESISTANT Resistant     IMIPENEM <=0.25 SENSITIVE Sensitive     NITROFURANTOIN <=16 SENSITIVE Sensitive     TRIMETH/SULFA <=20 SENSITIVE Sensitive     AMPICILLIN/SULBACTAM 4 SENSITIVE Sensitive     PIP/TAZO <=4 SENSITIVE Sensitive ug/mL    * 50,000 COLONIES/mL ESCHERICHIA COLI     Discharge Instructions:   Discharge Instructions     Call MD for:  temperature >100.4   Complete by: As directed    Diet - low sodium heart healthy   Complete by: As directed    Discharge instructions   Complete by: As directed    Follow-up with your primary care provider at the skilled nursing facility in 3 to 5 days.  Check blood work at that time.  Take medications as prescribed.  Seek medical attention for worsening symptoms.  Increase fluid intake.   Increase activity slowly   Complete by: As directed       Allergies as of 10/23/2023   No Known Allergies      Medication List     TAKE these medications    Acetaminophen 325 MG Chew Chew 650 mg by mouth every 6 (six) hours as needed (pain, fever, headache).   apixaban 2.5 MG Tabs tablet Commonly known as: ELIQUIS Take 1 tablet (2.5 mg total) by mouth 2 (two) times daily. What changed:  medication strength how much to take   beta carotene [address] UNIT capsule Take 25,000 Units by mouth daily.   Calcium+D3 500-10 MG-MCG Tabs Generic drug: Calcium Carb-Cholecalciferol Take 1 tablet by mouth in the morning.   citalopram 20 MG tablet Commonly  known as: CELEXA Take 10 mg by mouth daily.   clotrimazole 1 % cream Commonly known as: LOTRIMIN Apply 1 Application topically 2 (two) times daily. Apply to right foot   esomeprazole 40 MG capsule Commonly known as: NEXIUM Take 1 capsule (40 mg total) by mouth 2 (two) times daily before a meal for 14 days, THEN 1 capsule (40 mg total) daily after breakfast. Start taking on: October 23, 2023 What changed: See the new instructions.   ezetimibe-simvastatin 10-20 MG tablet Commonly known as: VYTORIN Take 1 tablet by mouth every evening.   fosfomycin 3 g Pack Commonly known as: MONUROL Take 3 g by mouth once for 1 dose. Give on 10/27/23 Start taking on: October 27, 2023   furosemide 20 MG tablet Commonly known as: LASIX Take 1 tablet (20 mg total) by mouth daily.   gabapentin 100 MG capsule Commonly known as: NEURONTIN Take 100 mg by mouth 3 (three) times daily.   metoprolol tartrate 25 MG tablet Commonly known  as: LOPRESSOR Take 1 tablet (25 mg total) by mouth 2 (two) times daily. Hold for SBP < 105 or HR < 60   potassium chloride 20 MEQ packet Commonly known as: KLOR-CON Take 20 mEq by mouth in the morning.   vitamin B-12 500 MCG tablet Commonly known as: CYANOCOBALAMIN Take 500 mcg by mouth every morning.          Time coordinating discharge: 39 minutes  Signed:  Yuki Brunsman  [facility] 10/23/2023, 1:53 PM

## 2023-10-23 NOTE — TOC Progression Note (Addendum)
 Transition of Care [facility]) - Progression Note    Patient Details  Name: Ana Franco MRN: [id number] Date of Birth: [birth year]  Transition of [facility]) CM/SW Contact  Mearl Latin, LCSW Phone Number: 10/23/2023, 9:16 AM  Clinical Narrative:    9am-CSW received request From [facility] to obtain SNF auth as they would like to bring patient to SNF side. CSW will initiate insurance process.   CSW obtained SNF authorization for Ana Franco, Ref# [id number], 10/24/2023-10/28/2023.    12pm-CSW met with patient to discuss plan. Patient stated that she does not want to go to the SNF side. She provided permission to speak with her son. CSW spoke with patient's son, Brett Canales. He wants patient to go to SNF side so he will call her and call CSW back.  1:48 PM-Steve was able to speak with patient and she is in agreement with SNF. CSW updated [employer]. Brett Canales requested [facility] for transport.   Expected Discharge Plan: Assisted Living Barriers to Discharge: Continued Medical Work up  Expected Discharge Plan and Services In-house Referral: Clinical Social Work   Post Acute Care Choice: Resumption of Svcs/PTA Provider Living arrangements for the past 2 months: Assisted Living Facility                                       Social Determinants of Health (SDOH) Interventions SDOH Screenings   Food Insecurity: No Food Insecurity (10/12/2023)  Housing: Low Risk  (10/12/2023)  Transportation Needs: No Transportation Needs (10/12/2023)  Utilities: Not At Risk (10/12/2023)  Social Connections: Moderately Isolated (10/12/2023)  Tobacco Use: Low Risk  (10/21/2023)    Readmission Risk Interventions     No data to display

## 2023-10-23 NOTE — TOC Transition Note (Addendum)
 Transition of Care [facility]) - Discharge Note   Patient Details  Name: Ana Franco MRN: [id number] Date of Birth: [birth year]  Transition of Care [facility]) CM/SW Contact:  Mearl Latin, LCSW Phone Number: 10/23/2023, 4:40 PM   Clinical Narrative:    CSW received call from SNF stating they cannot accept her there due to not having a private room until Saturday for her ESBL. CSW escalated to leadership but patient's discharge order is in so patient needs to return to ALF since SNF is not the therapy recommendation versus finding an alternative SNF. Patient wanting to return to her ALF.   Per [facility], they have arranged for patient to return to her ALF room and therapies will come to her instead. CSW contacted [facility] to resume transport. CSW updated patient's son.    Final next level of care: Assisted Living Barriers to Discharge: Barriers Resolved   Patient Goals and CMS Choice Patient states their goals for this hospitalization and ongoing recovery are:: Return to ALF CMS Medicare.gov Compare Post Acute Care list provided to:: Patient Choice offered to / list presented to : Patient [place] ownership interest in [facility]provided to:: Patient    Discharge Placement   Existing PASRR number confirmed : 10/23/23          Patient chooses bed at: [facility] Patient to be transferred to facility by: [facility] Name of family member notified: Brett Canales Patient and family notified of of transfer: 10/23/23  Discharge Plan and Services Additional resources added to the After Visit Summary for   In-house Referral: Clinical Social Work   Post Acute Care Choice: Resumption of Svcs/PTA Provider                               Social Drivers of Health (SDOH) Interventions SDOH Screenings   Food Insecurity: No Food Insecurity (10/12/2023)  Housing: Low Risk  (10/12/2023)  Transportation Needs: No Transportation Needs (10/12/2023)  Utilities: Not At Risk (10/12/2023)  Social  Connections: Moderately Isolated (10/12/2023)  Tobacco Use: Low Risk  (10/21/2023)     Readmission Risk Interventions     No data to display

## 2023-10-23 NOTE — Plan of Care (Signed)
  Problem: Education: Goal: Knowledge of General Education information will improve Description: Including pain rating scale, medication(s)/side effects and non-pharmacologic comfort measures Outcome: Adequate for Discharge   Problem: Health Behavior/Discharge Planning: Goal: Ability to manage health-related needs will improve Outcome: Adequate for Discharge   Problem: Clinical Measurements: Goal: Ability to maintain clinical measurements within normal limits will improve Outcome: Adequate for Discharge Goal: Will remain free from infection Outcome: Adequate for Discharge Goal: Diagnostic test results will improve Outcome: Adequate for Discharge Goal: Respiratory complications will improve Outcome: Adequate for Discharge Goal: Cardiovascular complication will be avoided Outcome: Adequate for Discharge   Problem: Activity: Goal: Risk for activity intolerance will decrease Outcome: Adequate for Discharge   Problem: Nutrition: Goal: Adequate nutrition will be maintained Outcome: Adequate for Discharge   Problem: Coping: Goal: Level of anxiety will decrease Outcome: Adequate for Discharge   Problem: Elimination: Goal: Will not experience complications related to bowel motility Outcome: Adequate for Discharge Goal: Will not experience complications related to urinary retention Outcome: Adequate for Discharge   Problem: Pain Managment: Goal: General experience of comfort will improve and/or be controlled Outcome: Adequate for Discharge   Problem: Safety: Goal: Ability to remain free from injury will improve Outcome: Adequate for Discharge   Problem: Skin Integrity: Goal: Risk for impaired skin integrity will decrease Outcome: Adequate for Discharge   Problem: Activity: Goal: Capacity to carry out activities will improve Outcome: Adequate for Discharge   Problem: Cardiac: Goal: Ability to achieve and maintain adequate cardiopulmonary perfusion will improve Outcome:  Adequate for Discharge   Problem: Education: Goal: Knowledge of disease or condition will improve Outcome: Adequate for Discharge Goal: Understanding of medication regimen will improve Outcome: Adequate for Discharge Goal: Individualized Educational Video(s) Outcome: Adequate for Discharge   Problem: Activity: Goal: Ability to tolerate increased activity will improve Outcome: Adequate for Discharge   Problem: Cardiac: Goal: Ability to achieve and maintain adequate cardiopulmonary perfusion will improve Outcome: Adequate for Discharge   Problem: Health Behavior/Discharge Planning: Goal: Ability to safely manage health-related needs after discharge will improve Outcome: Adequate for Discharge

## 2023-10-23 NOTE — Progress Notes (Signed)
 Report given to Alain Marion, Charity fundraiser at [facility].  IV and tele monitor removed, all belongings packed, including her LLE prosthetic.  Pt in agreement with DC plan

## 2023-10-23 NOTE — NC FL2 (Signed)
 [place] MEDICAID FL2 LEVEL OF CARE FORM     IDENTIFICATION  Patient Name: Ana Franco Grade Birthdate: 1925-11-27 Sex: female Admission Date (Current Location): 10/21/2023  [facility] and [place] Number:  Producer, television/film/video and Address:  [facility] [employer], [address], [place], [place] [address]      Provider Number: [id number]  Attending Physician Name and Address:  Joycelyn Das, MD  Relative Name and Phone Number:       Current Level of Care: Hospital Recommended Level of Care: Skilled Nursing Facility Prior Approval Number:    Date Approved/Denied:   PASRR Number: [id number]  Discharge Plan: SNF    Current Diagnoses: Patient Active Problem List   Diagnosis Date Noted   Sepsis (HCC) 10/21/2023   Hematemesis 10/21/2023   Hyperkalemia 10/21/2023   Acute on chronic HFrEF (heart failure with reduced ejection fraction) (HCC) 10/12/2023   Acute CHF (congestive heart failure) (HCC) 10/12/2023   ARF (acute renal failure) (HCC) 10/05/2023   SIRS (systemic inflammatory response syndrome) (HCC) 10/04/2023   Anemia of chronic disease 10/04/2023   COVID-19 virus antibody detected 10/04/2023   Acute kidney injury superimposed on stage 3a chronic kidney disease (HCC) 10/03/2023   Hypokalemia 12/15/2022   Acute diastolic CHF (congestive heart failure) (HCC) 12/14/2022   Acute hyponatremia 12/14/2022   Normocytic anemia 12/14/2022   Coronary artery disease 12/14/2022   Hx of BKA, left (HCC) 12/14/2022   Permanent atrial fibrillation (HCC) 12/18/2021   H/O: CVA (cerebrovascular accident) 12/18/2021   History of non-ST elevation myocardial infarction (NSTEMI) 12/18/2021   Acute CVA (cerebrovascular accident) (HCC) 01/07/2021   Thrombocytopenia (HCC) 01/06/2021   TIA (transient ischemic attack) 01/06/2021   DNR (do not resuscitate) 01/06/2021   Chronic kidney disease CKD IIIb 01/06/2021   Paroxysmal atrial fibrillation (HCC) 05/04/2018   Gangrene of  lower extremity (HCC)    Subacute osteomyelitis, left ankle and foot (HCC)    S/P ORIF (open reduction internal fixation) fracture 04/03/2018   Wound infection 04/02/2018   MRSA (methicillin resistant staph aureus) culture positive 04/02/2018   Acute blood loss anemia 04/02/2018   Closed left ankle fracture, with delayed healing, subsequent encounter 03/31/2018   Essential hypertension 06/23/2014   HLD (hyperlipidemia) 06/23/2014   GERD (gastroesophageal reflux disease) 06/23/2014   UTI (urinary tract infection) 06/23/2014   Depression 06/23/2014    Orientation RESPIRATION BLADDER Height & Weight     Self, Situation, Place  Normal Incontinent, External catheter Weight: 179 lb 7.3 oz (81.4 kg) Height:  5\' 8"  (172.7 cm)  BEHAVIORAL SYMPTOMS/MOOD NEUROLOGICAL BOWEL NUTRITION STATUS      Continent Diet (See dc summary)  AMBULATORY STATUS COMMUNICATION OF NEEDS Skin   Limited Assist Verbally Normal                       Personal Care Assistance Level of Assistance  Bathing, Feeding, Dressing Bathing Assistance: Limited assistance Feeding assistance: Limited assistance Dressing Assistance: Limited assistance     Functional Limitations Info  Sight Sight Info: Impaired        SPECIAL CARE FACTORS FREQUENCY  PT (By licensed PT), OT (By licensed OT)     PT Frequency: 5x/week OT Frequency: 5x/week            Contractures Contractures Info: Not present    Additional Factors Info  Code Status, Allergies, Isolation Precautions Code Status Info: DNR Allergies Info: NKA     Isolation Precautions Info: ESBL     Current  Medications (10/23/2023):  This is the current hospital active medication list Current Facility-Administered Medications  Medication Dose Route Frequency Provider Last Rate Last Admin   acetaminophen (TYLENOL) tablet 650 mg  650 mg Oral Q6H PRN Briscoe Burns, MD   650 mg at 10/23/23 0302   Or   acetaminophen (TYLENOL) suppository 650 mg  650 mg  Rectal Q6H PRN Briscoe Burns, MD       citalopram (CELEXA) tablet 10 mg  10 mg Oral Daily Briscoe Burns, MD   10 mg at 10/23/23 0932   ezetimibe (ZETIA) tablet 10 mg  10 mg Oral Daily Briscoe Burns, MD   10 mg at 10/22/23 1732   And   simvastatin (ZOCOR) tablet 20 mg  20 mg Oral q1800 Briscoe Burns, MD   20 mg at 10/22/23 1732   meropenem (MERREM) 500 mg in sodium chloride 0.9 % 100 mL IVPB  500 mg Intravenous Q12H Briscoe Burns, MD 200 mL/hr at 10/22/23 2130 500 mg at 10/22/23 2130   ondansetron (ZOFRAN) tablet 4 mg  4 mg Oral Q6H PRN Briscoe Burns, MD       Or   ondansetron [facility]) injection 4 mg  4 mg Intravenous Q6H PRN Briscoe Burns, MD       pantoprazole (PROTONIX) EC tablet 40 mg  40 mg Oral BID Hammons, Gerhard Munch, [facility]         Discharge Medications: Please see discharge summary for a list of discharge medications.  Relevant Imaging Results:  Relevant Lab Results:   Additional Information [id number]  Mearl Latin, LCSW

## 2023-10-23 NOTE — TOC Transition Note (Signed)
 Transition of Care [facility]) - Discharge Note   Patient Details  Name: Ana Franco MRN: [id number] Date of Birth: [birth year]  Transition [facility] Care [facility]) CM/SW Contact:  Mearl Latin, LCSW Phone Number: 10/23/2023, 3:25 PM   Clinical Narrative:    Patient will DC to: [facility] SNF side Anticipated DC date: 10/23/23 Family notified: Son, Brett Canales Transport by: Sharin Mons   Per MD patient ready for DC to [facility]. RN to call report prior to discharge [phone] room 57B). RN, patient, patient's family, and facility notified of DC. Discharge Summary and FL2 sent to facility. DC packet on chart including signed DNR. Ambulance transport requested for patient.   CSW will sign off for now as social work intervention is no longer needed. Please consult [place] again if new needs arise.     Final next level of care: Assisted Living Barriers to Discharge: Barriers Resolved   Patient Goals and CMS Choice Patient states their goals for this hospitalization and ongoing recovery are:: Return to ALF CMS Medicare.gov Compare Post Acute Care list provided to:: Patient Choice offered to / list presented to : Patient [place] ownership interest in [facility].provided to:: Patient    Discharge Placement   Existing PASRR number confirmed : 10/23/23          Patient chooses bed at: [facility] Patient to be transferred to facility by: [facility] Name of family member notified: Brett Canales Patient and family notified of of transfer: 10/23/23  Discharge Plan and Services Additional resources added to the After Visit Summary for   In-house Referral: Clinical Social Work   Post Acute Care Choice: Resumption of Svcs/PTA Provider                               Social Drivers of Health (SDOH) Interventions SDOH Screenings   Food Insecurity: No Food Insecurity (10/12/2023)  Housing: Low Risk  (10/12/2023)  Transportation Needs: No Transportation Needs (10/12/2023)  Utilities: Not At  Risk (10/12/2023)  Social Connections: Moderately Isolated (10/12/2023)  Tobacco Use: Low Risk  (10/21/2023)     Readmission Risk Interventions     No data to display

## 2023-10-26 LAB — CULTURE, BLOOD (ROUTINE X 2)
Culture: NO GROWTH
Culture: NO GROWTH
Special Requests: ADEQUATE
Special Requests: ADEQUATE

## 2023-11-19 ENCOUNTER — Emergency Department (HOSPITAL_COMMUNITY)

## 2023-11-19 ENCOUNTER — Emergency Department (HOSPITAL_COMMUNITY)
Admission: EM | Admit: 2023-11-19 | Discharge: 2023-11-19 | Disposition: A | Discharge status: Home / Self Care | Attending: Emergency Medicine | Admitting: Emergency Medicine

## 2023-11-19 ENCOUNTER — Other Ambulatory Visit: Payer: Self-pay

## 2023-11-19 ENCOUNTER — Encounter (HOSPITAL_COMMUNITY): Payer: Self-pay | Admitting: Pharmacy Technician

## 2023-11-19 DIAGNOSIS — Z7901 Long term (current) use of anticoagulants: Secondary | ICD-10-CM | POA: Insufficient documentation

## 2023-11-19 DIAGNOSIS — Z8673 Personal history of transient ischemic attack (TIA), and cerebral infarction without residual deficits: Secondary | ICD-10-CM | POA: Diagnosis not present

## 2023-11-19 DIAGNOSIS — N189 Chronic kidney disease, unspecified: Secondary | ICD-10-CM | POA: Diagnosis not present

## 2023-11-19 DIAGNOSIS — R0602 Shortness of breath: Secondary | ICD-10-CM | POA: Diagnosis not present

## 2023-11-19 DIAGNOSIS — I2510 Atherosclerotic heart disease of native coronary artery without angina pectoris: Secondary | ICD-10-CM | POA: Diagnosis not present

## 2023-11-19 DIAGNOSIS — Z79899 Other long term (current) drug therapy: Secondary | ICD-10-CM | POA: Diagnosis not present

## 2023-11-19 DIAGNOSIS — I129 Hypertensive chronic kidney disease with stage 1 through stage 4 chronic kidney disease, or unspecified chronic kidney disease: Secondary | ICD-10-CM | POA: Diagnosis not present

## 2023-11-19 DIAGNOSIS — R600 Localized edema: Secondary | ICD-10-CM | POA: Insufficient documentation

## 2023-11-19 DIAGNOSIS — R079 Chest pain, unspecified: Secondary | ICD-10-CM | POA: Diagnosis present

## 2023-11-19 LAB — CBC WITH DIFFERENTIAL/PLATELET
Abs Immature Granulocytes: 0.01 10*3/uL (ref 0.00–0.07)
Basophils Absolute: 0 10*3/uL (ref 0.0–0.1)
Basophils Relative: 0 %
Eosinophils Absolute: 0.1 10*3/uL (ref 0.0–0.5)
Eosinophils Relative: 1 %
HCT: 32.7 % — ABNORMAL LOW (ref 36.0–46.0)
Hemoglobin: 10.3 g/dL — ABNORMAL LOW (ref 12.0–15.0)
Immature Granulocytes: 0 %
Lymphocytes Relative: 19 %
Lymphs Abs: 1.2 10*3/uL (ref 0.7–4.0)
MCH: 30 pg (ref 26.0–34.0)
MCHC: 31.5 g/dL (ref 30.0–36.0)
MCV: 95.3 fL (ref 80.0–100.0)
Monocytes Absolute: 0.6 10*3/uL (ref 0.1–1.0)
Monocytes Relative: 9 %
Neutro Abs: 4.6 10*3/uL (ref 1.7–7.7)
Neutrophils Relative %: 71 %
Platelets: 170 10*3/uL (ref 150–400)
RBC: 3.43 MIL/uL — ABNORMAL LOW (ref 3.87–5.11)
RDW: 15.6 % — ABNORMAL HIGH (ref 11.5–15.5)
WBC: 6.5 10*3/uL (ref 4.0–10.5)
nRBC: 0 % (ref 0.0–0.2)

## 2023-11-19 LAB — BASIC METABOLIC PANEL WITH GFR
Anion gap: 11 (ref 5–15)
BUN: 13 mg/dL (ref 8–23)
CO2: 22 mmol/L (ref 22–32)
Calcium: 8.6 mg/dL — ABNORMAL LOW (ref 8.9–10.3)
Chloride: 101 mmol/L (ref 98–111)
Creatinine, Ser: 0.94 mg/dL (ref 0.44–1.00)
GFR, Estimated: 56 mL/min — ABNORMAL LOW (ref >60)
Glucose, Bld: 108 mg/dL — ABNORMAL HIGH (ref 70–99)
Potassium: 5.1 mmol/L (ref 3.5–5.1)
Sodium: 134 mmol/L — ABNORMAL LOW (ref 135–145)

## 2023-11-19 LAB — BRAIN NATRIURETIC PEPTIDE: B Natriuretic Peptide: 433.7 pg/mL — ABNORMAL HIGH (ref 0.0–100.0)

## 2023-11-19 LAB — TROPONIN I (HIGH SENSITIVITY)
Troponin I (High Sensitivity): 11 ng/L (ref <18)
Troponin I (High Sensitivity): 11 ng/L (ref <18)

## 2023-11-19 MED ORDER — FUROSEMIDE 10 MG/ML IJ SOLN
40.0000 mg | Freq: Once | INTRAMUSCULAR | Status: AC
  Administered 2023-11-19: 40 mg via INTRAVENOUS
  Filled 2023-11-19: qty 4

## 2023-11-19 NOTE — ED Notes (Signed)
[facility] called - 2nd on list

## 2023-11-19 NOTE — ED Provider Notes (Signed)
 [place] EMERGENCY DEPARTMENT AT [facility] Provider Note   CSN: [id number] Arrival date & time: 11/19/23  1302     History  Chief Complaint  Patient presents with   Chest Pain    Ana Franco is a 89 y.o. female.  Patient is a 89 year old female with a history of coronary artery disease, chronic kidney disease, CVA, hypertension, atrial fibrillation on Eliquis.  She lives in a facility.  She presents with chest pain.  She states that she has had some intermittent chest pain.  Its left-sided.  She says it is worse when she eats but she has it other x 2.  She denies any worsening shortness of breath.  No cough or cold symptoms.  No worsening symptoms with exertion although she says when she walks to the bathroom and comes back she feels more out of breath.  She denies any increased leg swelling.  She has been admitted several times in the recent past for multiple things, most recently for pneumonia, prior to that for acute CHF exacerbation.       Home Medications Prior to Admission medications   Medication Sig Start Date End Date Taking? Authorizing Provider  Acetaminophen 325 MG CHEW Chew 650 mg by mouth every 6 (six) hours as needed (pain, fever, headache).   Yes [provider]  apixaban (ELIQUIS) 2.5 MG TABS tablet Take 1 tablet (2.5 mg total) by mouth 2 (two) times daily. 10/23/23  Yes Pokhrel, Laxman, MD  beta carotene 40981 UNIT capsule Take 25,000 Units by mouth daily. 12/07/21  Yes [provider]  CALCIUM+D3 500-10 MG-MCG TABS Take 1 tablet by mouth in the morning. 12/16/22  Yes [provider]  citalopram (CELEXA) 20 MG tablet Take 10 mg by mouth daily.   Yes [provider]  clotrimazole (LOTRIMIN) 1 % cream Apply 1 Application topically 2 (two) times daily. Apply to right foot 04/29/23  Yes [provider]  esomeprazole (NEXIUM) 40 MG capsule Take 1 capsule (40 mg total) by mouth 2 (two) times daily before a meal for 14  days, THEN 1 capsule (40 mg total) daily after breakfast. 10/23/23 11/05/24 Yes Pokhrel, Laxman, MD  ezetimibe-simvastatin (VYTORIN) 10-20 MG tablet Take 1 tablet by mouth every evening. 11/17/21  Yes [provider]  furosemide (LASIX) 20 MG tablet Take 1 tablet (20 mg total) by mouth daily. 10/14/23  Yes Mdala-Gausi, Gwenette Greet, MD  gabapentin (NEURONTIN) 100 MG capsule Take 100 mg by mouth 3 (three) times daily. 11/28/21  Yes [provider]  metoprolol tartrate (LOPRESSOR) 25 MG tablet Take 1 tablet (25 mg total) by mouth 2 (two) times daily. Hold for SBP < 105 or HR < 60 12/16/22  Yes Danford, Earl Lites, MD  potassium chloride (KLOR-CON) 20 MEQ packet Take 20 mEq by mouth in the morning.   Yes [provider]  vitamin B-12 (CYANOCOBALAMIN) 500 MCG tablet Take 500 mcg by mouth every morning.   Yes [provider]      Allergies    Patient has no known allergies.    Review of Systems   Review of Systems  Constitutional:  Positive for fatigue. Negative for chills, diaphoresis and fever.  HENT:  Negative for congestion, rhinorrhea and sneezing.   Eyes: Negative.   Respiratory:  Positive for chest tightness and shortness of breath. Negative for cough.   Cardiovascular:  Negative for chest pain and leg swelling.  Gastrointestinal:  Negative for abdominal pain, blood in stool, diarrhea, nausea and  vomiting.  Genitourinary:  Negative for difficulty urinating, flank pain, frequency and hematuria.  Musculoskeletal:  Negative for arthralgias and back pain.  Skin:  Negative for rash.  Neurological:  Negative for dizziness, speech difficulty, weakness, numbness and headaches.    Physical Exam Updated Vital Signs BP (!) 165/62   Pulse (!) 35   Temp (!) 97.4 F (36.3 C)   Resp 14   SpO2 98%  Physical Exam Constitutional:      Appearance: She is well-developed.  HENT:     Head: Normocephalic and atraumatic.  Eyes:     Pupils: Pupils are equal, round, and  reactive to light.  Cardiovascular:     Rate and Rhythm: Normal rate and regular rhythm.     Heart sounds: Normal heart sounds.  Pulmonary:     Effort: Pulmonary effort is normal. No respiratory distress.     Breath sounds: Normal breath sounds. No wheezing or rales.  Chest:     Chest wall: No tenderness.  Abdominal:     General: Bowel sounds are normal.     Palpations: Abdomen is soft.     Tenderness: There is no abdominal tenderness. There is no guarding or rebound.  Musculoskeletal:        General: Normal range of motion.     Cervical back: Normal range of motion and neck supple.     Comments: Left leg amputation.  1+ pitting edema to the right lower extremity which patient says is baseline for her.  Chronic skin changes noted.  Lymphadenopathy:     Cervical: No cervical adenopathy.  Skin:    General: Skin is warm and dry.     Findings: No rash.  Neurological:     Mental Status: She is alert and oriented to person, place, and time.     ED Results / Procedures / Treatments   Labs (all labs ordered are listed, but only abnormal results are displayed) Labs Reviewed  BASIC METABOLIC PANEL WITH GFR - Abnormal; Notable for the following components:      Result Value   Sodium 134 (*)    Glucose, Bld 108 (*)    Calcium 8.6 (*)    GFR, Estimated 56 (*)    All other components within normal limits  CBC WITH DIFFERENTIAL/PLATELET - Abnormal; Notable for the following components:   RBC 3.43 (*)    Hemoglobin 10.3 (*)    HCT 32.7 (*)    RDW 15.6 (*)    All other components within normal limits  BRAIN NATRIURETIC PEPTIDE - Abnormal; Notable for the following components:   B Natriuretic Peptide 433.7 (*)    All other components within normal limits  TROPONIN I (HIGH SENSITIVITY)  TROPONIN I (HIGH SENSITIVITY)    EKG EKG Interpretation Date/Time:  Wednesday November 19 2023 13:11:31 EDT Ventricular Rate:  82 PR Interval:    QRS Duration:  96 QT Interval:  424 QTC  Calculation: 461 R Axis:   -58  Text Interpretation: Atrial fibrillation Ventricular bigeminy Inferior infarct, old Probable anterior infarct, old Confirmed by Rolan Bucco [phone]) on 11/19/2023 1:16:07 PM  Radiology DG Chest Port 1 View Result Date: 11/19/2023 CLINICAL DATA:  Chest pain EXAM: PORTABLE CHEST 1 VIEW COMPARISON:  Chest x-ray performed October 21, 2023 FINDINGS: Heart mediastinum are not significantly changed. Mild interstitial prominence, grossly similar. Modest improvement in left basilar aeration. Possible trace pleural effusions. IMPRESSION: 1. Modest improvement in left basilar aeration. 2. Mild central congestion. 3. Trace pleural effusions. Electronically Signed  By: Lowell Guitar M.D.   On: 11/19/2023 14:39    Procedures Procedures    Medications Ordered in ED Medications  furosemide (LASIX) injection 40 mg (has no administration in time range)    ED Course/ Medical Decision Making/ A&P                                 Medical Decision Making Amount and/or Complexity of Data Reviewed Labs: ordered. Radiology: ordered.  Risk Prescription drug management.   Patient is a 89 year old who presents with left-sided chest pain.  She also has noted some shortness of breath on ambulation.  Per nursing home notes, she was hypoxic with oxygen saturations in the upper 80s over there although she has not had any hypoxia here.  She is maintaining normal oxygen saturations on room air.  Chest x-ray was interpreted by me and confirmed by the radiologist to show some mild central congestion but no overt edema.  EKG shows atrial fibrillation with an underlying bradycardic rhythm and multiple PVCs with some bigeminy.  I reviewed her chart and she was seen previously with similar EKGs.  Her heart rate varies but is anywhere from the 40s to the 80s.  Her blood pressures remained stable.  Her first troponin is negative.  Her BNP is elevated in the 40s but similar to prior values.  Given  her symptoms, she was given an extra dose of Lasix here in the ED, 40 mg of IV Lasix.  She is currently asymptomatic.  If second troponin is normal, can likely be discharged back to the nursing facility.  Other labs reviewed and are nonconcerning.  Care turned over to Dr. Andria Meuse pending second troponin.  Final Clinical Impression(s) / ED Diagnoses Final diagnoses:  Nonspecific chest pain    Rx / DC Orders ED Discharge Orders     None         Rolan Bucco, MD 11/19/23 1519

## 2023-11-19 NOTE — ED Triage Notes (Signed)
 Pt here via ems from [facility] with reports of chest pressure for the last few days. EMS found pt to be in afib with a rate of 30-40.

## 2023-11-19 NOTE — ED Provider Notes (Signed)
  Physical Exam  BP (!) 174/63   Pulse (!) 38   Temp (!) 97.4 F (36.3 C)   Resp 12   SpO2 100%   Physical Exam  Procedures  Procedures  ED Course / MDM    Medical Decision Making Amount and/or Complexity of Data Reviewed Labs: ordered. Radiology: ordered.  Risk Prescription drug management.   Ana Franco, assumed care for this patient.  In brief 89 year old female came in today for chest pain.  Patient was signed out pending delta troponin.  Upon my assessment the patient she overall looks well.  Her troponin is flat at 11.  She has atrial fibrillation with a variable rate.  This appears to be consistent with prior visits to the ED and hospitalizations by chart review.  Patient overall looks well, feels well.  Believe discharge is appropriate.  Will have her follow-up with her PCP.       Anders Simmonds T, DO 11/19/23 1652

## 2023-11-19 NOTE — Discharge Instructions (Signed)
 While you were in the emergency room, he had blood work done that was normal.  Please continue taking all of your medications as prescribed, and follow-up with your PCP within 1 week.

## 2023-12-09 ENCOUNTER — Encounter (HOSPITAL_BASED_OUTPATIENT_CLINIC_OR_DEPARTMENT_OTHER): Payer: Self-pay | Admitting: Cardiology

## 2023-12-09 ENCOUNTER — Ambulatory Visit (HOSPITAL_BASED_OUTPATIENT_CLINIC_OR_DEPARTMENT_OTHER): Payer: Medicare Other | Admitting: Cardiology

## 2023-12-09 VITALS — BP 134/74 | HR 68 | Ht 68.0 in

## 2023-12-09 DIAGNOSIS — I4821 Permanent atrial fibrillation: Secondary | ICD-10-CM

## 2023-12-09 DIAGNOSIS — Z8673 Personal history of transient ischemic attack (TIA), and cerebral infarction without residual deficits: Secondary | ICD-10-CM | POA: Diagnosis not present

## 2023-12-09 DIAGNOSIS — Z7901 Long term (current) use of anticoagulants: Secondary | ICD-10-CM

## 2023-12-09 DIAGNOSIS — R0789 Other chest pain: Secondary | ICD-10-CM

## 2023-12-09 DIAGNOSIS — D6859 Other primary thrombophilia: Secondary | ICD-10-CM | POA: Diagnosis not present

## 2023-12-09 DIAGNOSIS — I5032 Chronic diastolic (congestive) heart failure: Secondary | ICD-10-CM | POA: Diagnosis not present

## 2023-12-09 DIAGNOSIS — I10 Essential (primary) hypertension: Secondary | ICD-10-CM

## 2023-12-09 DIAGNOSIS — I252 Old myocardial infarction: Secondary | ICD-10-CM

## 2023-12-09 NOTE — Patient Instructions (Signed)
 Medication Instructions:  No changes *If you need a refill on your cardiac medications before your next appointment, please call your pharmacy*  Lab Work: No labs If you have labs (blood work) drawn today and your tests are completely normal, you will receive your results only by: MyChart Message (if you have MyChart) OR A paper copy in the mail If you have any lab test that is abnormal or we need to change your treatment, we will call you to review the results.  Testing/Procedures: No testing  Follow-Up: At [facility], you and your health needs are our priority.  As part of our continuing mission to provide you with exceptional heart care, our providers are all part of one team.  This team includes your primary Cardiologist (physician) and Advanced Practice Providers or APPs (Physician Assistants and Nurse Practitioners) who all work together to provide you with the care you need, when you need it.  Your next appointment:   Already scheduled  We recommend signing up for the patient portal called "MyChart".  Sign up information is provided on this After Visit Summary.  MyChart is used to connect with patients for Virtual Visits (Telemedicine).  Patients are able to view lab/test results, encounter notes, upcoming appointments, etc.  Non-urgent messages can be sent to your provider as well.   To learn more about what you can do with MyChart, go to [email or web address].

## 2023-12-09 NOTE — Progress Notes (Signed)
 Cardiology Office Note:  .   Date:  12/09/2023  ID:  Ana Franco, DOB [birth year], MRN [id number] PCP: Shari Daughters, MD  [facility] Providers Cardiologist:  Sheryle Donning, MD {  History of Present Illness: .   Ana Franco is a 89 y.o. 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 I initially met her in 2019 as a new patient for the evaluation and management of hypertension.   CV history: she suffered a fall while staying [place] the beach in [place] in 02/2018. She had an ankle fracture, which was treated with open reduction and internal fixation. Unfortunately, her course was complicated by troponin elevation consistent with NSTEMI; did not pursue cardiac catheterization. Did have echocardiogram, which patient and her son report as normal. Unfortunately she had complications with resistant MRSA infection. She had hardware removal on 03/31/18, and ultimately she underwent transtibial amputation on 04/10/18.  Today: I have not seen her since 2023, last seen by Neomi Banks in 12/2022. Has had multiple admissions/ER visits recently, starting with Covid in February. Had admission for fluid overload, then dehydration/sepsis/UTI. Most recent ER visit was 11/19/23 for chest pain. She notes that this pain occurs on the left lateral aspect of her ribs after she eats, improves with belching.   Reviewed her recent hospitalizations, including echo from March.   Feeling near her baseline. No swelling, breathing is stable.  ROS: Denies chest pain, shortness of breath at rest or with normal exertion. No PND, orthopnea, LE edema or unexpected weight gain. No syncope or palpitations. ROS otherwise negative except as noted.   Studies Reviewed: Aaron Aas    EKG:       Physical Exam:   VS:  BP 134/74   Pulse 68   Ht 5\' 8"  (1.727 m)   SpO2 92%    BMI 27.29 kg/m    Wt Readings from Last 3 Encounters:  10/23/23 179 lb 7.3 oz (81.4 kg)  10/14/23 167 lb 12.3 oz (76.1 kg)  10/08/23 176 lb 12.9 oz (80.2 kg)    GEN: Well nourished, well developed in no acute distress HEENT: Normal, moist mucous membranes NECK: No JVD CARDIAC: irregularly irregular rhythm, normal S1 and S2, no rubs or gallops. 1/6 systolic murmur. VASCULAR: Radial pulses 2+ bilaterally. No carotid bruits RESPIRATORY:  Clear to auscultation without rales, wheezing or rhonchi  ABDOMEN: Soft, non-tender, non-distended MUSCULOSKELETAL:  S/P L transtibial amputation SKIN: Warm and dry, no edema NEUROLOGIC:  Alert and oriented x 3. No focal neuro deficits noted. PSYCHIATRIC:  Normal affect    ASSESSMENT AND PLAN: .    Chest pain, noncardiac -after eating, resolved with belching, HsTn normal -no further eval at this time  Chronic diastolic heart failure -reviewed multiple recent hospitalizations. BNP chronically elevated and within her recent range. Euvolemic on exam -recent echo with EF 60-65%, moderate TR -continue lasix  20 mg daily -reviewed fluid restrictions, signs/symptoms to watch for  History of NSTEMI, 02/2018 History of CVA 12/2020 Hyperlipidemia -continue apixaban  as above -on ezetimibe -simvastatin    Hypertension -now only on furosemide . Reviewed log from facility, within reasonable range   Permanent atrial fibrillation Frequent PVCs -CHA2DS2/VAS Stroke Risk Points= 7 -continue apixaban , dose reduced for age/renal function based on Cr at prior hospitalization. Most recent Cr 0.94. Would hold on increasing dose to 5 mg until we are sure her Cr remains stable -asymptomatic -continue metoprolol  for rate  control/PVC suppression. Limited options for uptitration given heart rate  Dispo: 3 months or sooner as needed  Total time of encounter: I spent 41 minutes dedicated to the care of this patient on the date of this encounter to include pre-visit  review of records, face-to-face time with the patient discussing conditions above, and clinical documentation with the electronic health record. We specifically spent time today discussing her multiple recent hospitalizations, reviewing workup, discussing diastolic heart failure, reviewing medications and management   Signed, Sheryle Donning, MD   Sheryle Donning, MD, PhD, [facility]  [facility]  [place] [address], [address] [place], [place] [address] [phone]

## 2024-01-14 ENCOUNTER — Telehealth: Payer: Self-pay | Admitting: Pharmacy Technician

## 2024-01-14 NOTE — Telephone Encounter (Signed)
 Received this fax. I scanned in media as well under "ELIQUIS "

## 2024-01-15 NOTE — Telephone Encounter (Addendum)
 Request for Eliquis  received. Indication: afib  Last office visit: 12/09/2023 Scr: 0.94, 11/19/2023 Age: 89 yo  Weight:  81.4 kg   Per dosing criteria pt qualifies for a dose increase. However per Dr. Idolina Maker note from 12/09/2023:    CHA2DS2/VAS Stroke Risk Points= 7 -continue apixaban , dose reduced for age/renal function based on Cr at prior hospitalization. Most recent Cr 0.94. Would hold on increasing dose to 5 mg until we are sure her Cr remains stable -asymptomatic -continue metoprolol  for rate control/PVC suppression. Limited options for uptitration given heart rate     Will send to Dr. Veryl Gottron to confirm.

## 2024-01-16 NOTE — Telephone Encounter (Signed)
 Ana Donning, MD to Me    01/16/24 12:52 PM I cannot see that she has had a recent kidney function. This would determine her apixaban  dose. If her kidney function is still Cr <1.5 then I would do the 5 mg BID dose, but if Cr >1.5 I would keep to the 2.5 mg BID dose. Has she had labs anywhere else recently? If not, would short term refill at the 2.5 mg BID dose and order BMET for further dosing.  Discussed with Dr Veryl Gottron, will obtain CBC (anemia hospital stay in March)  and BMET

## 2024-01-16 NOTE — Telephone Encounter (Signed)
 Spoke with Ana Franco, labs below   5/19 BUN 17.4 Cr 1.12  5/7 Hgb 10 HCT 30.8 RBC 3.38    Discussed with Dr Veryl Gottron via secure chat and will increase Eliquis  to 5 mg twice a day   Advised Ana Franco, verbalized understanding

## 2024-01-16 NOTE — Addendum Note (Signed)
 Addended by: Marci Setter B on: 01/16/2024 02:56 PM   Modules accepted: Orders

## 2024-01-16 NOTE — Telephone Encounter (Signed)
 Calling to check the status of this message.

## 2024-03-01 ENCOUNTER — Encounter (INDEPENDENT_AMBULATORY_CARE_PROVIDER_SITE_OTHER): Payer: Self-pay | Admitting: Physician Assistant

## 2024-03-01 ENCOUNTER — Ambulatory Visit (INDEPENDENT_AMBULATORY_CARE_PROVIDER_SITE_OTHER): Admitting: Physician Assistant

## 2024-03-01 VITALS — BP 181/90 | HR 59

## 2024-03-01 DIAGNOSIS — H6123 Impacted cerumen, bilateral: Secondary | ICD-10-CM | POA: Diagnosis not present

## 2024-03-01 NOTE — Progress Notes (Signed)
 Dear Dr. Brutus, Here is my assessment for our mutual patient, Ana Franco. Thank you for allowing me the opportunity to care for your patient. Please do not hesitate to contact me should you have any other questions. Sincerely, Chyrl Cohen PA-C  Otolaryngology Clinic Note Referring provider: Dr. Brutus HPI:  Ana Franco is a 89 y.o. female kindly referred by Dr. Brutus   The patient is a 89 year old female seen today for evaluation of cerumen impaction.  She is companied by her son.  He notes that she is in assisted living, as part of her comprehensive exam they evaluate her ears.  They noted bilateral cerumen impaction, they attempted to clean it out but were unsuccessful.  Patient reports some difficulty hearing, she feels like the left is worse than the right.  No history of trauma to the ears no ear surgery.    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)    Chronic venous insufficiency    Depression    Difficulty in walking    Displaced trimalleolar fracture of left lower leg    Essential (primary) hypertension    GERD (gastroesophageal reflux disease)    History of blood transfusion    02/2018    History of falling    Hyperlipidemia    Hyperlipidemia    Hypertension    Infection and inflammatory reaction due to other internal orthopedic prosthetic devices, implants and grafts, subsequent encounter    Iron deficiency    Major depressive disorder    Muscle weakness (generalized)    Myocardial infarction (HCC)    nonstemi mi - 02/19/2018 at [facility] after ankle fracture    Nutritional deficiency, unspecified    Obesity    Other lack of coordination    Unspecified atrial fibrillation (HCC)    Unspecified osteoarthritis, unspecified site      Past  Surgical History:  Procedure Laterality Date   AMPUTATION Left 04/10/2018   Procedure: LEFT BELOW KNEE AMPUTATION;  Surgeon: Harden Jerona GAILS, MD;  Location: [facility] OR;  Service: Orthopedics;  Laterality: Left;   APPENDECTOMY     APPLICATION OF WOUND VAC Left 03/31/2018   Procedure: APPLICATION OF WOUND VAC;  Surgeon: Beverley Evalene BIRCH, MD;  Location: [facility] ORS;  Service: Orthopedics;  Laterality: Left;   HARDWARE REMOVAL Left 03/31/2018   Procedure: HARDWARE REMOVAL;  Surgeon: Beverley Evalene BIRCH, MD;  Location: [facility] ORS;  Service: Orthopedics;  Laterality: Left;   IRRIGATION AND DEBRIDEMENT FOOT Left 03/31/2018   Procedure: IRRIGATION AND DEBRIDEMENT OF LEFT ANKLE;  Surgeon: Beverley Evalene BIRCH, MD;  Location: [facility] ORS;  Service: Orthopedics;  Laterality: Left;   ORIF ANKLE FRACTURE Right 06/13/2014   Procedure: OPEN REDUCTION INTERNAL FIXATION (ORIF) BIMALLEOLAR ANKLE FRACTURE;  Surgeon: Toribio JULIANNA Beverley, MD;  Location: [facility] OR;  Service: Orthopedics;  Laterality: Right;    History reviewed. No pertinent family history.   Social Connections: Moderately Isolated (10/12/2023)   Social Connection and Isolation Panel    Frequency of Communication with Friends and Family: Three times a week    Frequency of Social Gatherings with Friends and Family: Three times a week    Attends Religious Services: Never    Active Member of Clubs or Organizations: Yes    Attends Banker Meetings: Never  Marital Status: Widowed      Current Outpatient Medications:    Acetaminophen  325 MG CHEW, Chew 650 mg by mouth every 6 (six) hours as needed (pain, fever, headache)., Disp: , Rfl:    apixaban  (ELIQUIS ) 5 MG TABS tablet, Take 5 mg by mouth 2 (two) times daily., Disp: , Rfl:    beta carotene 25000 UNIT capsule, Take 25,000 Units by mouth daily., Disp: , Rfl:    CALCIUM +D3 500-10 MG-MCG TABS, Take 1 tablet by mouth in the morning., Disp: , Rfl:    citalopram  (CELEXA ) 20 MG tablet, Take 10 mg by mouth daily., Disp: ,  Rfl:    clotrimazole (LOTRIMIN) 1 % cream, Apply 1 Application topically 2 (two) times daily. Apply to right foot, Disp: , Rfl:    esomeprazole  (NEXIUM ) 40 MG capsule, Take 1 capsule (40 mg total) by mouth 2 (two) times daily before a meal for 14 days, THEN 1 capsule (40 mg total) daily after breakfast., Disp: , Rfl:    ezetimibe -simvastatin  (VYTORIN ) 10-20 MG tablet, Take 1 tablet by mouth every evening., Disp: , Rfl:    furosemide  (LASIX ) 20 MG tablet, Take 1 tablet (20 mg total) by mouth daily., Disp: 30 tablet, Rfl: 2   gabapentin  (NEURONTIN ) 100 MG capsule, Take 100 mg by mouth 3 (three) times daily., Disp: , Rfl:    metoprolol  tartrate (LOPRESSOR ) 25 MG tablet, Take 1 tablet (25 mg total) by mouth 2 (two) times daily. Hold for SBP < 105 or HR < 60, Disp: 60 tablet, Rfl: 3   potassium chloride  (KLOR-CON ) 20 MEQ packet, Take 20 mEq by mouth in the morning., Disp: , Rfl:    vitamin B-12 (CYANOCOBALAMIN ) 500 MCG tablet, Take 500 mcg by mouth every morning., Disp: , Rfl:    Physical Exam:   BP (!) 181/90   Pulse (!) 59   SpO2 94%   Pertinent Findings  CN II-XII intact Bilateral external auditory canal with cerumen impaction Weber 512: equal Rinne 512: AC > BC b/l  Anterior rhinoscopy: Septum midline; bilateral inferior turbinates with hypertrophy No lesions of oral cavity/oropharynx; dentition within normal limits No obviously palpable neck masses/lymphadenopathy/thyromegaly No respiratory distress or stridor  Seprately Identifiable Procedures:  Procedure: Bilateral ear microscopy and cerumen removal using microscope (CPT [phone]) - Mod 50 Pre-procedure diagnosis: bilateral cerumen impaction external auditory canals Post-procedure diagnosis: same Indication: bilateral cerumen impaction; given patient's otologic complaints and history as well as for improved and comprehensive examination of external ear and tympanic membrane, bilateral otologic examination using microscope was performed  and impacted cerumen removed  Procedure: Patient was placed semi-recumbent. Both ear canals were examined using the microscope with findings above. Cerumen removed from bilateral external auditory canals using suction and currette with improvement in EAC examination and patency. Left: EAC was patent. TM was intact . Middle ear was aerated. Drainage: none Right: EAC was patent. TM was intact . Middle ear was aerated . Drainage: none Patient tolerated the procedure well.   Impression & Plans:  Ana Franco is a 89 y.o. female with the following   Cerumen impaction-  The patient presented today with cerumen impaction.  This was removed without difficulty.  I see no signs of infection.  The patient will reach out to the office if she develops any new or worsening signs or symptoms.  She does not feel she has any significant change to her baseline hearing and did not elect for audiology evaluation today.    - f/u 6 months for repeat  exam, sooner as needed   Thank you for allowing me the opportunity to care for your patient. Please do not hesitate to contact me should you have any other questions.  Sincerely, Chyrl Cohen PA-C [facility] Phone: [phone] Fax: [phone]  03/01/2024, 10:32 AM

## 2024-04-01 ENCOUNTER — Ambulatory Visit (HOSPITAL_BASED_OUTPATIENT_CLINIC_OR_DEPARTMENT_OTHER): Admitting: Cardiology

## 2024-05-02 ENCOUNTER — Emergency Department (HOSPITAL_COMMUNITY)

## 2024-05-02 ENCOUNTER — Inpatient Hospital Stay (HOSPITAL_COMMUNITY)
Admission: EM | Admit: 2024-05-02 | Discharge: 2024-05-06 | DRG: 206 | Disposition: A | Discharge status: Skilled Nursing Facility | Source: Skilled Nursing Facility | Attending: General Surgery | Admitting: General Surgery

## 2024-05-02 DIAGNOSIS — K5730 Diverticulosis of large intestine without perforation or abscess without bleeding: Secondary | ICD-10-CM | POA: Diagnosis present

## 2024-05-02 DIAGNOSIS — N3289 Other specified disorders of bladder: Secondary | ICD-10-CM | POA: Diagnosis present

## 2024-05-02 DIAGNOSIS — S42252A Displaced fracture of greater tuberosity of left humerus, initial encounter for closed fracture: Secondary | ICD-10-CM | POA: Diagnosis present

## 2024-05-02 DIAGNOSIS — E871 Hypo-osmolality and hyponatremia: Secondary | ICD-10-CM | POA: Diagnosis present

## 2024-05-02 DIAGNOSIS — Z9181 History of falling: Secondary | ICD-10-CM

## 2024-05-02 DIAGNOSIS — D62 Acute posthemorrhagic anemia: Secondary | ICD-10-CM | POA: Diagnosis present

## 2024-05-02 DIAGNOSIS — S2232XA Fracture of one rib, left side, initial encounter for closed fracture: Secondary | ICD-10-CM | POA: Diagnosis present

## 2024-05-02 DIAGNOSIS — E785 Hyperlipidemia, unspecified: Secondary | ICD-10-CM | POA: Diagnosis present

## 2024-05-02 DIAGNOSIS — R0902 Hypoxemia: Secondary | ICD-10-CM | POA: Diagnosis present

## 2024-05-02 DIAGNOSIS — N323 Diverticulum of bladder: Secondary | ICD-10-CM | POA: Diagnosis present

## 2024-05-02 DIAGNOSIS — T1490XA Injury, unspecified, initial encounter: Secondary | ICD-10-CM | POA: Diagnosis present

## 2024-05-02 DIAGNOSIS — Z89512 Acquired absence of left leg below knee: Secondary | ICD-10-CM | POA: Diagnosis not present

## 2024-05-02 DIAGNOSIS — I2510 Atherosclerotic heart disease of native coronary artery without angina pectoris: Secondary | ICD-10-CM | POA: Diagnosis present

## 2024-05-02 DIAGNOSIS — S32810A Multiple fractures of pelvis with stable disruption of pelvic ring, initial encounter for closed fracture: Secondary | ICD-10-CM

## 2024-05-02 DIAGNOSIS — W19XXXA Unspecified fall, initial encounter: Secondary | ICD-10-CM | POA: Diagnosis present

## 2024-05-02 DIAGNOSIS — S0990XA Unspecified injury of head, initial encounter: Secondary | ICD-10-CM | POA: Diagnosis present

## 2024-05-02 DIAGNOSIS — Z79899 Other long term (current) drug therapy: Secondary | ICD-10-CM

## 2024-05-02 DIAGNOSIS — S32592A Other specified fracture of left pubis, initial encounter for closed fracture: Secondary | ICD-10-CM | POA: Diagnosis present

## 2024-05-02 DIAGNOSIS — Z8673 Personal history of transient ischemic attack (TIA), and cerebral infarction without residual deficits: Secondary | ICD-10-CM | POA: Diagnosis not present

## 2024-05-02 DIAGNOSIS — M6289 Other specified disorders of muscle: Secondary | ICD-10-CM | POA: Diagnosis present

## 2024-05-02 DIAGNOSIS — I509 Heart failure, unspecified: Secondary | ICD-10-CM | POA: Diagnosis present

## 2024-05-02 DIAGNOSIS — Z7901 Long term (current) use of anticoagulants: Secondary | ICD-10-CM

## 2024-05-02 DIAGNOSIS — I252 Old myocardial infarction: Secondary | ICD-10-CM

## 2024-05-02 DIAGNOSIS — I872 Venous insufficiency (chronic) (peripheral): Secondary | ICD-10-CM | POA: Diagnosis present

## 2024-05-02 DIAGNOSIS — I110 Hypertensive heart disease with heart failure: Secondary | ICD-10-CM | POA: Diagnosis present

## 2024-05-02 DIAGNOSIS — I4891 Unspecified atrial fibrillation: Secondary | ICD-10-CM | POA: Diagnosis present

## 2024-05-02 DIAGNOSIS — M25522 Pain in left elbow: Secondary | ICD-10-CM | POA: Diagnosis present

## 2024-05-02 DIAGNOSIS — M79622 Pain in left upper arm: Secondary | ICD-10-CM | POA: Diagnosis not present

## 2024-05-02 DIAGNOSIS — N289 Disorder of kidney and ureter, unspecified: Secondary | ICD-10-CM | POA: Diagnosis present

## 2024-05-02 DIAGNOSIS — K219 Gastro-esophageal reflux disease without esophagitis: Secondary | ICD-10-CM | POA: Diagnosis present

## 2024-05-02 DIAGNOSIS — R590 Localized enlarged lymph nodes: Secondary | ICD-10-CM | POA: Diagnosis present

## 2024-05-02 LAB — PROTIME-INR
INR: 1.3 — ABNORMAL HIGH (ref 0.8–1.2)
Prothrombin Time: 17 s — ABNORMAL HIGH (ref 11.4–15.2)

## 2024-05-02 LAB — I-STAT CHEM 8, ED
BUN: 20 mg/dL (ref 8–23)
Calcium, Ion: 1.12 mmol/L — ABNORMAL LOW (ref 1.15–1.40)
Chloride: 97 mmol/L — ABNORMAL LOW (ref 98–111)
Creatinine, Ser: 1.7 mg/dL — ABNORMAL HIGH (ref 0.44–1.00)
Glucose, Bld: 123 mg/dL — ABNORMAL HIGH (ref 70–99)
HCT: 33 % — ABNORMAL LOW (ref 36.0–46.0)
Hemoglobin: 11.2 g/dL — ABNORMAL LOW (ref 12.0–15.0)
Potassium: 4.4 mmol/L (ref 3.5–5.1)
Sodium: 133 mmol/L — ABNORMAL LOW (ref 135–145)
TCO2: 26 mmol/L (ref 22–32)

## 2024-05-02 LAB — CBC
HCT: 34.8 % — ABNORMAL LOW (ref 36.0–46.0)
Hemoglobin: 11.1 g/dL — ABNORMAL LOW (ref 12.0–15.0)
MCH: 29.7 pg (ref 26.0–34.0)
MCHC: 31.9 g/dL (ref 30.0–36.0)
MCV: 93 fL (ref 80.0–100.0)
Platelets: 176 K/uL (ref 150–400)
RBC: 3.74 MIL/uL — ABNORMAL LOW (ref 3.87–5.11)
RDW: 13.9 % (ref 11.5–15.5)
WBC: 13.7 K/uL — ABNORMAL HIGH (ref 4.0–10.5)
nRBC: 0 % (ref 0.0–0.2)

## 2024-05-02 LAB — COMPREHENSIVE METABOLIC PANEL WITH GFR
ALT: 15 U/L (ref 0–44)
AST: 29 U/L (ref 15–41)
Albumin: 3.4 g/dL — ABNORMAL LOW (ref 3.5–5.0)
Alkaline Phosphatase: 82 U/L (ref 38–126)
Anion gap: 9 (ref 5–15)
BUN: 19 mg/dL (ref 8–23)
CO2: 24 mmol/L (ref 22–32)
Calcium: 8.8 mg/dL — ABNORMAL LOW (ref 8.9–10.3)
Chloride: 97 mmol/L — ABNORMAL LOW (ref 98–111)
Creatinine, Ser: 1.44 mg/dL — ABNORMAL HIGH (ref 0.44–1.00)
GFR, Estimated: 33 mL/min — ABNORMAL LOW (ref >60)
Glucose, Bld: 121 mg/dL — ABNORMAL HIGH (ref 70–99)
Potassium: 4.4 mmol/L (ref 3.5–5.1)
Sodium: 130 mmol/L — ABNORMAL LOW (ref 135–145)
Total Bilirubin: 0.7 mg/dL (ref 0.0–1.2)
Total Protein: 6.6 g/dL (ref 6.5–8.1)

## 2024-05-02 LAB — SAMPLE TO BLOOD BANK

## 2024-05-02 LAB — I-STAT CG4 LACTIC ACID, ED: Lactic Acid, Venous: 1 mmol/L (ref 0.5–1.9)

## 2024-05-02 LAB — ETHANOL: Alcohol, Ethyl (B): <15 mg/dL (ref <15)

## 2024-05-02 MED ORDER — ONDANSETRON 4 MG PO TBDP: 4.0000 mg | ORAL_TABLET | Freq: Four times a day (QID) | ORAL | Status: DC | PRN

## 2024-05-02 MED ORDER — EZETIMIBE 10 MG PO TABS
10.0000 mg | ORAL_TABLET | Freq: Every day | ORAL | Status: DC
Start: 2024-05-02 — End: 2024-05-06
  Administered 2024-05-02 – 2024-05-05 (×4): 10 mg via ORAL
  Filled 2024-05-02 (×4): qty 1

## 2024-05-02 MED ORDER — IOHEXOL 350 MG/ML SOLN
75.0000 mL | Freq: Once | INTRAVENOUS | Status: AC | PRN
  Administered 2024-05-02: 75 mL via INTRAVENOUS

## 2024-05-02 MED ORDER — CITALOPRAM HYDROBROMIDE 20 MG PO TABS
10.0000 mg | ORAL_TABLET | Freq: Every day | ORAL | Status: DC
  Administered 2024-05-03 – 2024-05-06 (×4): 10 mg via ORAL
  Filled 2024-05-02 (×4): qty 1

## 2024-05-02 MED ORDER — METOPROLOL TARTRATE 12.5 MG HALF TABLET
12.5000 mg | ORAL_TABLET | Freq: Two times a day (BID) | ORAL | Status: DC
  Administered 2024-05-02 – 2024-05-06 (×8): 12.5 mg via ORAL
  Filled 2024-05-02 (×8): qty 1

## 2024-05-02 MED ORDER — CALCIUM GLUCONATE-NACL 2-0.675 GM/100ML-% IV SOLN
2.0000 g | Freq: Once | INTRAVENOUS | Status: AC
  Administered 2024-05-02: 2000 mg via INTRAVENOUS
  Filled 2024-05-02: qty 100

## 2024-05-02 MED ORDER — LACTATED RINGERS IV SOLN: INTRAVENOUS | Status: AC

## 2024-05-02 MED ORDER — DOCUSATE SODIUM 100 MG PO CAPS
100.0000 mg | ORAL_CAPSULE | Freq: Two times a day (BID) | ORAL | Status: DC
  Administered 2024-05-02 – 2024-05-06 (×8): 100 mg via ORAL
  Filled 2024-05-02 (×8): qty 1

## 2024-05-02 MED ORDER — GABAPENTIN 100 MG PO CAPS
100.0000 mg | ORAL_CAPSULE | Freq: Three times a day (TID) | ORAL | Status: DC
Start: 2024-05-03 — End: 2024-05-06
  Administered 2024-05-03 – 2024-05-06 (×10): 100 mg via ORAL
  Filled 2024-05-02 (×10): qty 1

## 2024-05-02 MED ORDER — PANTOPRAZOLE SODIUM 40 MG PO TBEC
80.0000 mg | DELAYED_RELEASE_TABLET | Freq: Every day | ORAL | Status: DC
  Administered 2024-05-03 – 2024-05-06 (×4): 80 mg via ORAL
  Filled 2024-05-02 (×4): qty 2

## 2024-05-02 MED ORDER — POLYETHYLENE GLYCOL 3350 17 G PO PACK: 17.0000 g | PACK | Freq: Every day | ORAL | Status: DC | PRN

## 2024-05-02 MED ORDER — OXYCODONE HCL 5 MG PO TABS: 2.5000 mg | ORAL_TABLET | ORAL | Status: DC | PRN

## 2024-05-02 MED ORDER — ACETAMINOPHEN 325 MG PO TABS
650.0000 mg | ORAL_TABLET | Freq: Four times a day (QID) | ORAL | Status: DC
  Administered 2024-05-02 – 2024-05-03 (×3): 650 mg via ORAL
  Filled 2024-05-02 (×3): qty 2

## 2024-05-02 MED ORDER — GABAPENTIN 100 MG PO CAPS
100.0000 mg | ORAL_CAPSULE | Freq: Once | ORAL | Status: AC
Start: 2024-05-02 — End: 2024-05-02
  Administered 2024-05-02: 100 mg via ORAL
  Filled 2024-05-02: qty 1

## 2024-05-02 MED ORDER — EZETIMIBE-SIMVASTATIN 10-20 MG PO TABS: 1.0000 | ORAL_TABLET | Freq: Every evening | ORAL | Status: DC

## 2024-05-02 MED ORDER — LIDOCAINE 5 % EX PTCH
1.0000 | MEDICATED_PATCH | CUTANEOUS | Status: DC
  Administered 2024-05-02 – 2024-05-05 (×4): 1 via TRANSDERMAL
  Filled 2024-05-02 (×4): qty 1

## 2024-05-02 MED ORDER — SIMVASTATIN 20 MG PO TABS
20.0000 mg | ORAL_TABLET | Freq: Every day | ORAL | Status: DC
Start: 2024-05-02 — End: 2024-05-06
  Administered 2024-05-02 – 2024-05-05 (×4): 20 mg via ORAL
  Filled 2024-05-02 (×5): qty 1

## 2024-05-02 MED ORDER — METOPROLOL TARTRATE 5 MG/5ML IV SOLN: 5.0000 mg | Freq: Four times a day (QID) | INTRAVENOUS | Status: DC | PRN

## 2024-05-02 MED ORDER — HYDROMORPHONE HCL 1 MG/ML IJ SOLN: 0.5000 mg | INTRAMUSCULAR | Status: DC | PRN

## 2024-05-02 MED ORDER — ONDANSETRON HCL 4 MG/2ML IJ SOLN: 4.0000 mg | Freq: Four times a day (QID) | INTRAMUSCULAR | Status: DC | PRN

## 2024-05-02 MED ORDER — ENOXAPARIN SODIUM 30 MG/0.3ML IJ SOSY
30.0000 mg | PREFILLED_SYRINGE | INTRAMUSCULAR | Status: DC
  Administered 2024-05-04 – 2024-05-06 (×3): 30 mg via SUBCUTANEOUS
  Filled 2024-05-02 (×3): qty 0.3

## 2024-05-02 MED ORDER — HYDRALAZINE HCL 20 MG/ML IJ SOLN: 10.0000 mg | INTRAMUSCULAR | Status: DC | PRN

## 2024-05-02 NOTE — Progress Notes (Signed)
 Brief Orthopedic Note  Received consult call from the ER this evening for left-sided pubic rami fractures.  I reviewed the imaging.  These can be treated nonoperatively.  She can be weightbearing as tolerated on the left lower extremity.  Okay for diet and DVT prophylaxis from ortho perspective.  Per ED, patient being admitted to trauma. Full consult note to follow in AM.   Ozell DELENA Ada, MD Orthopedic Surgeon

## 2024-05-02 NOTE — ED Provider Notes (Signed)
 [place] [facility] AT [facility] Provider Note   CSN: [id number] Arrival date & time: 05/02/24  1727     Patient presents with: No chief complaint on file.   Ana Franco is a 89 y.o. female.   The history is provided by the patient and medical records. No language interpreter was used.  Trauma Mechanism of injury: Fall Injury location: head/neck, pelvis, torso and shoulder/arm Injury location detail: scalp, L shoulder, L chest and pelvis and L hip Incident location: outdoors Arrived directly from scene: yes   Fall:      Fall occurred: from a wheelchair.      Entrapped after fall: no  Current symptoms:      Associated symptoms:            Reports chest pain (left lateral chest wall pain).            Denies abdominal pain, back pain, headache, nausea, neck pain and vomiting.      Prior to Admission medications   Medication Sig Start Date End Date Taking? Authorizing Provider  Acetaminophen  325 MG CHEW Chew 650 mg by mouth every 6 (six) hours as needed (pain, fever, headache).    [provider]  apixaban  (ELIQUIS ) 5 MG TABS tablet Take 5 mg by mouth 2 (two) times daily.    [provider]  beta carotene 25000 UNIT capsule Take 25,000 Units by mouth daily. 12/07/21   [provider]  CALCIUM +D3 500-10 MG-MCG TABS Take 1 tablet by mouth in the morning. 12/16/22   [provider]  citalopram  (CELEXA ) 20 MG tablet Take 10 mg by mouth daily.    [provider]  clotrimazole (LOTRIMIN) 1 % cream Apply 1 Application topically 2 (two) times daily. Apply to right foot 04/29/23   [provider]  esomeprazole  (NEXIUM ) 40 MG capsule Take 1 capsule (40 mg total) by mouth 2 (two) times daily before a meal for 14 days, THEN 1 capsule (40 mg total) daily after breakfast. 10/23/23 11/05/24  Pokhrel, Vernal, MD  ezetimibe -simvastatin  (VYTORIN ) 10-20 MG tablet Take 1 tablet by mouth every evening. 11/17/21   [provider]  furosemide  (LASIX ) 20 MG tablet Take 1 tablet (20 mg total) by mouth daily. 10/14/23   Mdala-Gausi, Masiku Agatha, MD  gabapentin  (NEURONTIN ) 100 MG capsule Take 100 mg by mouth 3 (three) times daily. 11/28/21   [provider]  metoprolol  tartrate (LOPRESSOR ) 25 MG tablet Take 1 tablet (25 mg total) by mouth 2 (two) times daily. Hold for SBP < 105 or HR < 60 12/16/22   Danford, Lonni SQUIBB, MD  potassium chloride  (KLOR-CON ) 20 MEQ packet Take 20 mEq by mouth in the morning.    [provider]  vitamin B-12 (CYANOCOBALAMIN ) 500 MCG tablet Take 500 mcg by mouth every morning.    [provider]    Allergies: Patient has no known allergies.    Review of Systems  Constitutional:  Negative for chills, fatigue and fever.  HENT:  Negative for congestion.   Respiratory:  Negative for cough, chest tightness, shortness of breath and wheezing.   Cardiovascular:  Positive for chest pain (left lateral chest wall pain).  Gastrointestinal:  Negative for abdominal pain, constipation, diarrhea, nausea and vomiting.  Genitourinary:  Negative for dysuria.  Musculoskeletal:  Negative for back pain, neck pain and neck stiffness.  Skin:  Negative for rash and wound.  Neurological:  Negative for light-headedness and headaches.  Psychiatric/Behavioral:  Negative for agitation.  All other systems reviewed and are negative.   Updated Vital Signs BP 122/60   Pulse 90   Temp 98.8 F (37.1 C) (Oral)   Resp (!) 24   Ht 5' 8 (1.727 m)   Wt 82 kg   SpO2 92%   BMI 27.49 kg/m   Physical Exam Vitals and nursing note reviewed.  Constitutional:      General: She is not in acute distress.    Appearance: She is well-developed. She is not ill-appearing, toxic-appearing or diaphoretic.  HENT:     Head:      Mouth/Throat:     Mouth: Mucous membranes are moist.     Pharynx: No oropharyngeal exudate or posterior oropharyngeal erythema.  Eyes:     Extraocular Movements:  Extraocular movements intact.     Conjunctiva/sclera: Conjunctivae normal.     Pupils: Pupils are equal, round, and reactive to light.  Cardiovascular:     Rate and Rhythm: Normal rate and regular rhythm.     Heart sounds: No murmur heard. Pulmonary:     Effort: Pulmonary effort is normal. No respiratory distress.     Breath sounds: Normal breath sounds. No wheezing, rhonchi or rales.  Chest:     Chest wall: Tenderness present.    Abdominal:     General: Abdomen is flat.     Palpations: Abdomen is soft.     Tenderness: There is no abdominal tenderness. There is no guarding or rebound.  Musculoskeletal:        General: Tenderness present. No swelling.     Cervical back: Neck supple. No tenderness.     Comments: Mild tenderness to the left pelvis.  No other abdominal tenderness or anterior chest tenderness.  Distally she has amputation and prosthesis in the left lower leg.  Skin:    General: Skin is warm and dry.     Capillary Refill: Capillary refill takes less than 2 seconds.     Findings: Bruising present. No erythema or rash.  Neurological:     General: No focal deficit present.     Mental Status: She is alert.     Sensory: No sensory deficit.     Motor: No weakness.  Psychiatric:        Mood and Affect: Mood normal.     (all labs ordered are listed, but only abnormal results are displayed) Labs Reviewed  COMPREHENSIVE METABOLIC PANEL WITH GFR - Abnormal; Notable for the following components:      Result Value   Sodium 130 (*)    Chloride 97 (*)    Glucose, Bld 121 (*)    Creatinine, Ser 1.44 (*)    Calcium  8.8 (*)    Albumin 3.4 (*)    GFR, Estimated 33 (*)    All other components within normal limits  CBC - Abnormal; Notable for the following components:   WBC 13.7 (*)    RBC 3.74 (*)    Hemoglobin 11.1 (*)    HCT 34.8 (*)    All other components within normal limits  PROTIME-INR - Abnormal; Notable for the following components:   Prothrombin Time 17.0 (*)     INR 1.3 (*)    All other components within normal limits  I-STAT CHEM 8, ED - Abnormal; Notable for the following components:   Sodium 133 (*)    Chloride 97 (*)    Creatinine, Ser 1.70 (*)    Glucose, Bld 123 (*)    Calcium , Ion 1.12 (*)  Hemoglobin 11.2 (*)    HCT 33.0 (*)    All other components within normal limits  ETHANOL  URINALYSIS, ROUTINE W REFLEX MICROSCOPIC  I-STAT CG4 LACTIC ACID, ED  SAMPLE TO BLOOD BANK    EKG: None  Radiology: CT CHEST ABDOMEN PELVIS W CONTRAST Result Date: 05/02/2024 CLINICAL DATA:  Trauma.  Possible left-sided rib fractures. EXAM: CT CHEST, ABDOMEN, AND PELVIS WITH CONTRAST TECHNIQUE: Multidetector CT imaging of the chest, abdomen and pelvis was performed following the standard protocol during bolus administration of intravenous contrast. RADIATION DOSE REDUCTION: This exam was performed according to the departmental dose-optimization program which includes automated exposure control, adjustment of the mA and/or kV according to patient size and/or use of iterative reconstruction technique. CONTRAST:  75mL OMNIPAQUE  IOHEXOL  350 MG/ML SOLN COMPARISON:  None Available. FINDINGS: CT CHEST FINDINGS Cardiovascular: There is moderate severe cardiomegaly. There is no pericardial effusion. The aorta is normal in size. There are atherosclerotic calcifications of the. Mediastinum/Nodes: There is an enlarged subcarinal lymph node measuring 13 mm. Visualized thyroid  gland and esophagus are within normal limits. There is a small hiatal hernia. Lungs/Pleura: There scattered mild peripheral reticular opacities throughout both lungs. There some mild ground-glass opacities in the inferior left upper lobe. There some chronic bronchiectasis in the right lower lobe. There is no focal lung consolidation, pleural effusion or pneumothorax. There scattered 1-2 mm nodular densities throughout both lungs. Musculoskeletal: There is an acute lateral left seventh rib fracture. There  are healed bilateral rib fractures. The bones are diffusely osteopenic. Degenerative changes affect the spine. CT ABDOMEN PELVIS FINDINGS Hepatobiliary: No hepatic injury or perihepatic hematoma. Gallbladder is unremarkable. There is a subcentimeter hypodensity in the right lobe of the liver which is too small to characterize, likely cysts. Pancreas: Unremarkable. No pancreatic ductal dilatation or surrounding inflammatory changes. Spleen: No splenic injury or perisplenic hematoma. Adrenals/Urinary Tract: There is no adrenal hemorrhage or renal injury identified. There is no hydronephrosis. There scattered bilateral renal cysts measuring up to 16 mm. Mildly hyperdense rounded lesion in the left kidney measures 16 mm in diameter on image 6/16. There is bladder wall trabeculation diffusely. Multiple bladder diverticula are seen. Stomach/Bowel: Stomach is within normal limits. Appendix appears normal. No evidence of bowel wall thickening, distention, or inflammatory changes. There is sigmoid colon diverticulosis. Vascular/Lymphatic: There are extensive vascular calcifications of the aorta and iliac arteries. Aorta is normal in size. IVC is normal in size. No enlarged lymph nodes are identified. Reproductive: Uterus is surgically absent. Adnexa are within normal limits. Other: There is extraperitoneal anterior left pelvic hemorrhage adjacent to fractures measuring up to 18 mm in thickness. There is also asymmetric enlargement of the left iliacus muscle likely related to intramuscular hemorrhage from fracture. There is no ascites or focal abdominal wall hernia. Musculoskeletal: There are acute left superior and inferior pubic rami fractures which are nondisplaced. Pubic symphysis appears intact. There are questionable healed left sacral ala fractures. Sacroiliac joints appear intact. IMPRESSION: 1. Acute left superior and inferior pubic rami fractures. 2. Extraperitoneal hemorrhage in the anterior left pelvis adjacent to  fractures. 3. Asymmetric enlargement of the left iliacus muscle likely related to intramuscular hemorrhage from fracture. 4. Acute lateral left seventh rib fracture. 5. No pneumothorax or pleural effusion. 6. No acute posttraumatic sequelae in the abdomen or pelvis. 7. Cardiomegaly. 8. Mild mediastinal lymphadenopathy, indeterminate. 9. Mild ground-glass opacities in the inferior left upper lobe may be infectious/inflammatory. 10. 16 mm mildly hyperdense lesion in the left kidney. Recommend further evaluation with  ultrasound. 11. Bladder wall trabeculation with multiple bladder diverticula. 12. Colonic diverticulosis. 13. Aortic atherosclerosis. Aortic Atherosclerosis (ICD10-I70.0). Electronically Signed   By: Greig Pique M.D.   On: 05/02/2024 20:28   CT Head Wo Contrast Result Date: 05/02/2024 CLINICAL DATA:  Polytrauma, blunt.  Fall. EXAM: CT HEAD WITHOUT CONTRAST TECHNIQUE: Contiguous axial images were obtained from the base of the skull through the vertex without intravenous contrast. RADIATION DOSE REDUCTION: This exam was performed according to the departmental dose-optimization program which includes automated exposure control, adjustment of the mA and/or kV according to patient size and/or use of iterative reconstruction technique. COMPARISON:  03/20/2022 FINDINGS: Brain: There is atrophy and chronic small vessel disease changes. No acute intracranial abnormality. Specifically, no hemorrhage, hydrocephalus, mass lesion, acute infarction, or significant intracranial injury. Vascular: No hyperdense vessel or unexpected calcification. Skull: No acute calvarial abnormality. Sinuses/Orbits: No acute findings Other: Soft tissue swelling in the left temporal region. IMPRESSION: Atrophy, chronic microvascular disease. No acute intracranial abnormality. Electronically Signed   By: Franky Crease M.D.   On: 05/02/2024 18:21   CT Cervical Spine Wo Contrast Result Date: 05/02/2024 CLINICAL DATA:  Polytrauma,  blunt.  Fall. EXAM: CT CERVICAL SPINE WITHOUT CONTRAST TECHNIQUE: Multidetector CT imaging of the cervical spine was performed without intravenous contrast. Multiplanar CT image reconstructions were also generated. RADIATION DOSE REDUCTION: This exam was performed according to the departmental dose-optimization program which includes automated exposure control, adjustment of the mA and/or kV according to patient size and/or use of iterative reconstruction technique. COMPARISON:  03/20/2022 FINDINGS: Alignment: No subluxation. Skull base and vertebrae: No acute fracture. No primary bone lesion or focal pathologic process. Soft tissues and spinal canal: No prevertebral fluid or swelling. No visible canal hematoma. Disc levels: Moderate to advanced multilevel degenerative disc and facet disease. Upper chest: No acute findings Other: None IMPRESSION: Multilevel degenerative changes.  No acute bony abnormality. Electronically Signed   By: Franky Crease M.D.   On: 05/02/2024 18:19   DG Chest Port 1 View Result Date: 05/02/2024 CLINICAL DATA:  Trauma, fall. EXAM: PORTABLE CHEST 1 VIEW COMPARISON:  11/19/2023. FINDINGS: The heart is enlarged and mitral valve calcifications are noted. There is atherosclerotic calcification of the aorta. The visualized mediastinal contour is within normal limits. No consolidation, effusion, or pneumothorax is seen. Old rib fractures are noted on the right. There is a fracture of the T6 rib on the left, indeterminate in age. IMPRESSION: 1. No active disease. 2. Fracture of the T6 rib on the left, indeterminate in age. Electronically Signed   By: Leita Birmingham M.D.   On: 05/02/2024 17:59   DG Pelvis 1-2 Views Result Date: 05/02/2024 CLINICAL DATA:  Fall EXAM: PELVIS - 1-2 VIEW COMPARISON:  None Available. FINDINGS: The bones are diffusely osteopenic. Examination is limited secondary to patient rotation. Can not exclude nondisplaced fractures of the left superior and inferior pubic rami. No  other fractures are visualized. There is no dislocation. Mild degenerative changes affect hips. There is a extensive atherosclerotic calcification throughout the aorta and iliac arteries. IMPRESSION: 1. Can not exclude nondisplaced fractures of the left superior and inferior pubic rami. Recommend further evaluation with CT. 2. Osteopenia. Electronically Signed   By: Greig Pique M.D.   On: 05/02/2024 17:58   DG Shoulder Left Portable Result Date: 05/02/2024 CLINICAL DATA:  Fall EXAM: LEFT SHOULDER COMPARISON:  None Available. FINDINGS: The bones are diffusely osteopenic. There is no evidence of fracture or dislocation. There is no evidence of arthropathy or other  focal bone abnormality. Soft tissues are unremarkable. IMPRESSION: Negative. Electronically Signed   By: Greig Pique M.D.   On: 05/02/2024 17:56     Procedures   Medications Ordered in the ED  calcium  gluconate 2 g/ 100 mL sodium chloride  IVPB (has no administration in time range)  gabapentin  (NEURONTIN ) capsule 100 mg (100 mg Oral Given 05/02/24 2024)  iohexol  (OMNIPAQUE ) 350 MG/ML injection 75 mL (75 mLs Intravenous Contrast Given 05/02/24 2013)                                    Medical Decision Making Amount and/or Complexity of Data Reviewed Labs: ordered. Radiology: ordered.  Risk Prescription drug management.    Ana Franco is a 89 y.o. female with a past medical history significant for hypertension, hyperlipidemia, GERD, atrial fibrillation on Eliquis  therapy, left leg amputation, and CAD with previous MI who presents as a level 2 trauma for fall with head injury on blood thinners.  According to patient, she was at her nursing facility outside in her wheelchair when she hit a curb and tumbled out.  She does not think she lost consciousness but did hit her head.  She is complaining of pain in her left head, neck, and her left shoulder, left chest, and left hip.  She reports the pain is minimal.  She denies any nausea,  vomiting or vision changes.  She denies any central abdominal pain.  Denies shortness of breath.  Denies any back pain at this time.  On exam, airway is intact.  Breath sounds equal bilaterally.  The left lateral chest with slight tender to palpation as was her left shoulder.  She has an amputation with the prosthesis on the left leg.  Minimal tenderness in the left hip.  Abdomen nontender.  Good bowel sounds.  Patient has a hematoma with some bleeding in her left scalp, will clean and assess if it is a laceration or more of an abrasion.  Given her blood thinners, will get CT of the head and neck and will also get x-ray of the chest, left shoulder, and pelvis/left hip.  She will get some trauma labs in case there is a fracture that we need admission.  Anticipate reassessment after workup to determine disposition.  X-ray showed possible rib fracture and possible pelvis fracture.  CTs were recommended.  Will get CT chest/ab/pelvis to look for bony injuries.  Patient otherwise is feeling well and the CT head and neck did not show critical traumatic injuries.  Anticipate reassessment after the CT imaging of the torso.  CT imaging confirmed a left seventh rib fracture and pelvis fractures of the superior and inferior pubic ramus.  Patient on reassessment was now hypoxic with oxygen saturation of 85% on room air.  She does not take oxygen normally at home.  She will be placed on oxygen and trauma will admit.  Spoke to Dr. Georgina with orthopedics who will have his team see her in the morning to discuss management.  Patient will be admitted by trauma for further management of level trauma with multiple fractures and new oxygen requirement.  They did not comment about active extravasation on the CT pelvis.     Final diagnoses:  Fall, initial encounter  Hypoxia  Closed fracture of one rib of left side, initial encounter   Clinical Impression: 1. Fall, initial encounter   2. Hypoxia   3. Closed  fracture of  one rib of left side, initial encounter     Disposition: Admit  This note was prepared with assistance of Dragon voice recognition software. Occasional wrong-word or sound-a-like substitutions may have occurred due to the inherent limitations of voice recognition software.       Alanny Rivers, Lonni PARAS, MD 05/02/24 [phone]

## 2024-05-02 NOTE — Progress Notes (Signed)
   05/02/24 1800  Spiritual Encounters  Type of Visit Initial  Care provided to: Patient;Family  Reason for visit Trauma  OnCall Visit Yes    Chaplain was paged to level 2. The patient said that she fell from her wheelchair. Her left side is hurting. She was grateful by saying that it could have been worse than this. Her son was present at the bedside. Chaplain was present, listened to her and provided support.   M.Kubra Susanna Kerry Resident [phone]

## 2024-05-02 NOTE — H&P (Addendum)
 HPI  Ana Franco is an 89 y.o. female with history of hypertension, hyperlipidemia, GERD, atrial fibrillation on Eliquis , left leg amputation, and CAD with previous MI remotely who presents to [facility] after a mechanical fall earlier today from her wheelchair on a curb.  Per patient she was at her nursing facility outside in a wheelchair when she was trying to change locations and did not realize there was a curb when mobilizing and she tumbled out of her wheelchair.  She hit her head but she denies loss of consciousness.  She complains of pain to her head, neck, left shoulder, left chest, and left hip.  She states that her pain is overall well-controlled, and after she received a dose of gabapentin  she says pain is very much controlled.  Patient is satting well on 2 L nasal cannula, but she does not have oxygen requirements at home.  She was able to pull 1750 on incentive spirometry that was evaluated personally at bedside.  10 point review of systems is negative except as listed above in HPI.  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)    Chronic venous insufficiency    Depression    Difficulty in walking    Displaced trimalleolar fracture of left lower leg    Essential (primary) hypertension    GERD (gastroesophageal reflux disease)    History of blood transfusion    02/2018    History of falling    Hyperlipidemia    Hyperlipidemia    Hypertension    Infection and inflammatory reaction due to other internal orthopedic prosthetic devices, implants and grafts, subsequent encounter    Iron deficiency    Major depressive disorder    Muscle weakness (generalized)    Myocardial infarction (HCC)    nonstemi mi - 02/19/2018 at [facility] after ankle fracture    Nutritional deficiency, unspecified    Obesity    Other lack of  coordination    Unspecified atrial fibrillation (HCC)    Unspecified osteoarthritis, unspecified site     Past Surgical History: Past Surgical History:  Procedure Laterality Date   AMPUTATION Left 04/10/2018   Procedure: LEFT BELOW KNEE AMPUTATION;  Surgeon: Harden Jerona GAILS, MD;  Location: [facility] OR;  Service: Orthopedics;  Laterality: Left;   APPENDECTOMY     APPLICATION OF WOUND VAC Left 03/31/2018   Procedure: APPLICATION OF WOUND VAC;  Surgeon: Beverley Evalene BIRCH, MD;  Location: [facility] ORS;  Service: Orthopedics;  Laterality: Left;   HARDWARE REMOVAL Left 03/31/2018   Procedure: HARDWARE REMOVAL;  Surgeon: Beverley Evalene BIRCH, MD;  Location: [facility] ORS;  Service: Orthopedics;  Laterality: Left;   IRRIGATION AND DEBRIDEMENT FOOT Left 03/31/2018   Procedure: IRRIGATION AND DEBRIDEMENT OF LEFT ANKLE;  Surgeon: Beverley Evalene BIRCH, MD;  Location: [facility] ORS;  Service: Orthopedics;  Laterality: Left;   ORIF ANKLE FRACTURE Right 06/13/2014   Procedure: OPEN REDUCTION INTERNAL FIXATION (ORIF) BIMALLEOLAR ANKLE FRACTURE;  Surgeon: Toribio JULIANNA Beverley, MD;  Location: [facility] OR;  Service: Orthopedics;  Laterality: Right;    Family History:  No family history on file.  Social History:  reports that she has never smoked. She has never used smokeless tobacco. She reports that she does not drink alcohol and does not use drugs.  Allergies: No Known Allergies  Medications: I have reviewed the patient's  current medications.  Labs: I have personally reviewed all labs for the past 24h  Imaging: I have personally reviewed and interpreted all imaging for the past 24h and agree with the radiologist's impression.  CT CHEST ABDOMEN PELVIS W CONTRAST Result Date: 05/02/2024 CLINICAL DATA:  Trauma.  Possible left-sided rib fractures. EXAM: CT CHEST, ABDOMEN, AND PELVIS WITH CONTRAST TECHNIQUE: Multidetector CT imaging of the chest, abdomen and pelvis was performed following the standard protocol during bolus administration of intravenous  contrast. RADIATION DOSE REDUCTION: This exam was performed according to the departmental dose-optimization program which includes automated exposure control, adjustment of the mA and/or kV according to patient size and/or use of iterative reconstruction technique. CONTRAST:  75mL OMNIPAQUE  IOHEXOL  350 MG/ML SOLN COMPARISON:  None Available. FINDINGS: CT CHEST FINDINGS Cardiovascular: There is moderate severe cardiomegaly. There is no pericardial effusion. The aorta is normal in size. There are atherosclerotic calcifications of the. Mediastinum/Nodes: There is an enlarged subcarinal lymph node measuring 13 mm. Visualized thyroid  gland and esophagus are within normal limits. There is a small hiatal hernia. Lungs/Pleura: There scattered mild peripheral reticular opacities throughout both lungs. There some mild ground-glass opacities in the inferior left upper lobe. There some chronic bronchiectasis in the right lower lobe. There is no focal lung consolidation, pleural effusion or pneumothorax. There scattered 1-2 mm nodular densities throughout both lungs. Musculoskeletal: There is an acute lateral left seventh rib fracture. There are healed bilateral rib fractures. The bones are diffusely osteopenic. Degenerative changes affect the spine. CT ABDOMEN PELVIS FINDINGS Hepatobiliary: No hepatic injury or perihepatic hematoma. Gallbladder is unremarkable. There is a subcentimeter hypodensity in the right lobe of the liver which is too small to characterize, likely cysts. Pancreas: Unremarkable. No pancreatic ductal dilatation or surrounding inflammatory changes. Spleen: No splenic injury or perisplenic hematoma. Adrenals/Urinary Tract: There is no adrenal hemorrhage or renal injury identified. There is no hydronephrosis. There scattered bilateral renal cysts measuring up to 16 mm. Mildly hyperdense rounded lesion in the left kidney measures 16 mm in diameter on image 6/16. There is bladder wall trabeculation diffusely.  Multiple bladder diverticula are seen. Stomach/Bowel: Stomach is within normal limits. Appendix appears normal. No evidence of bowel wall thickening, distention, or inflammatory changes. There is sigmoid colon diverticulosis. Vascular/Lymphatic: There are extensive vascular calcifications of the aorta and iliac arteries. Aorta is normal in size. IVC is normal in size. No enlarged lymph nodes are identified. Reproductive: Uterus is surgically absent. Adnexa are within normal limits. Other: There is extraperitoneal anterior left pelvic hemorrhage adjacent to fractures measuring up to 18 mm in thickness. There is also asymmetric enlargement of the left iliacus muscle likely related to intramuscular hemorrhage from fracture. There is no ascites or focal abdominal wall hernia. Musculoskeletal: There are acute left superior and inferior pubic rami fractures which are nondisplaced. Pubic symphysis appears intact. There are questionable healed left sacral ala fractures. Sacroiliac joints appear intact. IMPRESSION: 1. Acute left superior and inferior pubic rami fractures. 2. Extraperitoneal hemorrhage in the anterior left pelvis adjacent to fractures. 3. Asymmetric enlargement of the left iliacus muscle likely related to intramuscular hemorrhage from fracture. 4. Acute lateral left seventh rib fracture. 5. No pneumothorax or pleural effusion. 6. No acute posttraumatic sequelae in the abdomen or pelvis. 7. Cardiomegaly. 8. Mild mediastinal lymphadenopathy, indeterminate. 9. Mild ground-glass opacities in the inferior left upper lobe may be infectious/inflammatory. 10. 16 mm mildly hyperdense lesion in the left kidney. Recommend further evaluation with ultrasound. 11. Bladder wall trabeculation with multiple bladder diverticula.  12. Colonic diverticulosis. 13. Aortic atherosclerosis. Aortic Atherosclerosis (ICD10-I70.0). Electronically Signed   By: Greig Pique M.D.   On: 05/02/2024 20:28   CT Head Wo Contrast Result Date:  05/02/2024 CLINICAL DATA:  Polytrauma, blunt.  Fall. EXAM: CT HEAD WITHOUT CONTRAST TECHNIQUE: Contiguous axial images were obtained from the base of the skull through the vertex without intravenous contrast. RADIATION DOSE REDUCTION: This exam was performed according to the departmental dose-optimization program which includes automated exposure control, adjustment of the mA and/or kV according to patient size and/or use of iterative reconstruction technique. COMPARISON:  03/20/2022 FINDINGS: Brain: There is atrophy and chronic small vessel disease changes. No acute intracranial abnormality. Specifically, no hemorrhage, hydrocephalus, mass lesion, acute infarction, or significant intracranial injury. Vascular: No hyperdense vessel or unexpected calcification. Skull: No acute calvarial abnormality. Sinuses/Orbits: No acute findings Other: Soft tissue swelling in the left temporal region. IMPRESSION: Atrophy, chronic microvascular disease. No acute intracranial abnormality. Electronically Signed   By: Franky Crease M.D.   On: 05/02/2024 18:21   CT Cervical Spine Wo Contrast Result Date: 05/02/2024 CLINICAL DATA:  Polytrauma, blunt.  Fall. EXAM: CT CERVICAL SPINE WITHOUT CONTRAST TECHNIQUE: Multidetector CT imaging of the cervical spine was performed without intravenous contrast. Multiplanar CT image reconstructions were also generated. RADIATION DOSE REDUCTION: This exam was performed according to the departmental dose-optimization program which includes automated exposure control, adjustment of the mA and/or kV according to patient size and/or use of iterative reconstruction technique. COMPARISON:  03/20/2022 FINDINGS: Alignment: No subluxation. Skull base and vertebrae: No acute fracture. No primary bone lesion or focal pathologic process. Soft tissues and spinal canal: No prevertebral fluid or swelling. No visible canal hematoma. Disc levels: Moderate to advanced multilevel degenerative disc and facet disease.  Upper chest: No acute findings Other: None IMPRESSION: Multilevel degenerative changes.  No acute bony abnormality. Electronically Signed   By: Franky Crease M.D.   On: 05/02/2024 18:19   DG Chest Port 1 View Result Date: 05/02/2024 CLINICAL DATA:  Trauma, fall. EXAM: PORTABLE CHEST 1 VIEW COMPARISON:  11/19/2023. FINDINGS: The heart is enlarged and mitral valve calcifications are noted. There is atherosclerotic calcification of the aorta. The visualized mediastinal contour is within normal limits. No consolidation, effusion, or pneumothorax is seen. Old rib fractures are noted on the right. There is a fracture of the T6 rib on the left, indeterminate in age. IMPRESSION: 1. No active disease. 2. Fracture of the T6 rib on the left, indeterminate in age. Electronically Signed   By: Leita Birmingham M.D.   On: 05/02/2024 17:59   DG Pelvis 1-2 Views Result Date: 05/02/2024 CLINICAL DATA:  Fall EXAM: PELVIS - 1-2 VIEW COMPARISON:  None Available. FINDINGS: The bones are diffusely osteopenic. Examination is limited secondary to patient rotation. Can not exclude nondisplaced fractures of the left superior and inferior pubic rami. No other fractures are visualized. There is no dislocation. Mild degenerative changes affect hips. There is a extensive atherosclerotic calcification throughout the aorta and iliac arteries. IMPRESSION: 1. Can not exclude nondisplaced fractures of the left superior and inferior pubic rami. Recommend further evaluation with CT. 2. Osteopenia. Electronically Signed   By: Greig Pique M.D.   On: 05/02/2024 17:58   DG Shoulder Left Portable Result Date: 05/02/2024 CLINICAL DATA:  Fall EXAM: LEFT SHOULDER COMPARISON:  None Available. FINDINGS: The bones are diffusely osteopenic. There is no evidence of fracture or dislocation. There is no evidence of arthropathy or other focal bone abnormality. Soft tissues are unremarkable. IMPRESSION: Negative.  Electronically Signed   By: Greig Pique M.D.    On: 05/02/2024 17:56     Physical Exam Blood pressure 122/60, pulse 90, temperature 98.8 F (37.1 C), temperature source Oral, resp. rate (!) 24, height 5' 8 (1.727 m), weight 82 kg, SpO2 92%. General: well-developed, well-nourished elderly female HEENT: pupils equal, round, reactive to light, moist conjunctiva, external inspection of ears and nose normal, hearing intact, ecchymosis and soft tissue swelling to left frontal scalp Oropharynx: normal oropharyngeal mucosa, normal dentition Neck: no thyromegaly, trachea midline, no midline cervical tenderness to palpation, no cervical spine step offs, paraspinal MSK pain to palpation CV: Irregularly irregular, normotensive Chest: breath sounds equal bilaterally, normal respiratory effort, no midline chest wall tenderness to palpation, + left lateral chest wall tenderness to palpation, no deformity. Abdomen: soft, nontender, and nondistended GU: normal external female genitalia Back: no wounds, no thoracic spine tenderness to palpation, no lumbar spine tenderness to palpation, no thoracic spine stepoffs, no lumbar spine stepoffs Rectal: deferred Extremities: 2+ radial pulses bilaterally, 1+ DP pulse to RLE, LLE prosthesis, L BKA, intact motor function to right lower extremity and bilateral upper extremities and intact sensation to right lower extremity and bilateral upper extremities, + peripheral edema, tenderness to palpation of her left hip, as well as to right shin where there is an associated bruise MSK: unable to assess gait/station, no clubbing/cyanosis of fingers/toes, normal ROM of right upper extremity and bilateral upper extremities, prosthesis to LLE Skin: warm, dry, no rashes, and ecchymosis to left forehead at hairline and to RLE Psych: normal memory, normal mood/affect  Neuro: GCS15 ([id number])    Assessment   Ana Franco is an 89 y.o. female with history of Afib on Eliquis  s/p mechanical fall earlier today.  Known Injuries: -  Left superior and inferior pubic rami fractures with associated extraperitoneal hemorrhage - Intramuscular hemorrhage of left iliacus - Left lateral 7th rib fracture  Incidental Findings: - Mild mediastinal lymphadenopathy - Mild ground glass opacities in the inferior left upper lobe - 16 mm mildly hyperdense lesion in the left kidney - Bladder wall trabeculation with multiple bladder diverticula - Colonic diverticulosis - Cardiomegaly  Plan  - Admit to trauma service for observation -Dr. Georgina with orthopedic surgery was consulted by the EDP, appreciate recs  - WBAT  - Ok for diet  - Ok for DVT ppx - Will start home meds: vytorin , celexa , gabapentin  - Will hold home Eliquis  and lasix  - Start 1/2 dose home metoprolol  (12.5 BID from 25 BID), can increase to home dose in AM if BP stable overnight - Will give 2g calcium  - FEN - CLD, will advance if oxygen requirements stable overnight and H/H stable - DVT - SCDs, hold chemical ppx due to bleeding concerns, if H/H stable in a.m., will plan to start DVT prophylaxis - Dispo - 4NP, DNR/DNI, has paperwork and confirmed verbally with patient, PT/OT  I reviewed ED provider notes, Consultant orthopaedic notes, last 24 h vitals and pain scores, last 48 h intake and output, last 24 h labs and trends, and last 24 h imaging results.  This care required moderate level of medical decision making.   I spent a total of 56 minutes in both face-to-face and non-face-to-face activities, excluding procedures performed, for this visit on the date of this encounter. I personally reviewed all labs and imaging. I discussed plan of care with EDP Dr. Ginger. I called son, Beulah Capobianco and discussed plan of care.  Orie Silversmith, MD [facility]

## 2024-05-02 NOTE — ED Triage Notes (Signed)
 Pt BIB [facility] from [facility] due to fall on thinners. Pt takes Eliquis  and has history of a-fib.  Pt does have DNR paperwork and facility paperwork at bedside.  Pt was in wheelchair outside and went over a curb and fell on her left side.  Pt does have left side hematoma and laceration (controlled) on head.  Pt does have left BKA.  Pt reports left sided chest pain and left sided hip pain. VS BP 154/62, HR 91, SpO2 94% RA

## 2024-05-02 NOTE — Consult Note (Signed)
 Orthopedic Surgery Consult Note  Assessment: Patient is a 89 y.o. female with pelvic ring injury   Plan: -Operative plans: none -Okay for diet and dvt ppx from orthopedic perspective -Weight bearing status: as tolerated -PT/OT evaluate and treat -Pain control -Dispo: per primary  ___________________________________________________________________________   Reason for consult: left-sided pelvic ring injury   History:  Patient is a 89 y.o. female who had a fall from a wheelchair.  She has been noted immediate onset of left groin pain.  No other extremity pain.  She also had pain in her chest wall.  She was admitted to trauma overnight.  She is reporting left groin pain, left elbow pain, and chest wall pain.  Denies pain elsewhere.  Review of systems: General: denies fevers and chills, myalgias Neurologic: denies recent changes in vision, slurred speech Abdomen: denies nausea, vomiting, hematemesis Respiratory: denies cough, shortness of breath  Past medical history:  HLD HTN GERD Depression Chronic venous insufficiency Atrial fibrillation History of MI  Allergies: NKDA  Past surgical history: Left BKA Right ankle fracture ORIF Appendectomy  Social history: Denies use of nicotine-containing products (cigarettes, vaping, smokeless, etc.) Alcohol use: denies Denies use of recreational drugs  Family history: -reviewed and not pertinent to pelvic ring injury   Physical Exam:  BMI of 27.5  General: no acute distress, appears stated age Neurologic: alert, answering questions appropriately, following commands Cardiovascular: regular rate, no cyanosis Respiratory: unlabored breathing on room air, symmetric chest rise Psychiatric: appropriate affect, normal cadence to speech  MSK:   -Bilateral upper extremities  No tenderness to palpation over extremity except over the left elbow, no gross deformity, no open wounds Fires deltoid, biceps, triceps, wrist  extensors, wrist flexors, finger extensors, finger flexors  AIN/PIN/IO intact  Palpable radial pulse  Sensation intact to light touch in median/ulnar/radial/axillary nerve distributions  Hand warm and well perfused  -Right lower extremity  No tenderness to palpation over extremity, no gross deformity, no open wounds, no pain with log roll Fires hip flexors, quadriceps, hamstrings, tibialis anterior, gastrocnemius and soleus, extensor hallucis longus Plantarflexes and dorsiflexes toes Sensation intact to light touch in sural, saphenous, tibial, deep peroneal, and superficial peroneal nerve distributions Foot warm and well perfused  -Left lower extremity  No tenderness to palpation over extremity, except around the hip. Pain with log roll. No gross deformity, no open wounds Fires hip flexors, quadriceps, hamstrings. Residual limb warm and well perfused. No distal sensorimotor exam due to prior BKA  Imaging: XR of the pelvis from 05/02/2024 was independently reviewed and interpreted, showing a nondisplaced left superior ramus fracture.  No other fracture seen.  No dislocation seen.  Right hip degenerative changes seen with joint space narrowing, subchondral sclerosis, and osteophyte formation.  CT of the abdomen pelvis from 05/02/2024 showed a minimally displaced left inferior ramus fracture, nondisplaced superior ramus fracture.  There is a nondisplaced sacral ala fracture on the left side as well.  No other pelvic ring injury or fracture seen.   Patient name: Ana Franco Patient MRN: [id number] Date: 05/03/2024

## 2024-05-02 NOTE — ED Notes (Signed)
 Trauma Response Nurse Documentation   Ana Franco is a 89 y.o. female arriving to [facility] ED via EMS  On Eliquis  (apixaban ) daily. Trauma was activated as a Level 2 by ED Charge RN based on the following trauma criteria Elderly patients > 65 with head trauma on anti-coagulation (excluding ASA).  Patient cleared for CT by Dr. Ginger. Pt transported to CT with trauma response nurse present to monitor. RN remained with the patient throughout their absence from the department for clinical observation.   GCS 15.  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)    Chronic venous insufficiency    Depression    Difficulty in walking    Displaced trimalleolar fracture of left lower leg    Essential (primary) hypertension    GERD (gastroesophageal reflux disease)    History of blood transfusion    02/2018    History of falling    Hyperlipidemia    Hyperlipidemia    Hypertension    Infection and inflammatory reaction due to other internal orthopedic prosthetic devices, implants and grafts, subsequent encounter    Iron deficiency    Major depressive disorder    Muscle weakness (generalized)    Myocardial infarction (HCC)    nonstemi mi - 02/19/2018 at [facility] after ankle fracture    Nutritional deficiency, unspecified    Obesity    Other lack of coordination    Unspecified atrial fibrillation (HCC)    Unspecified osteoarthritis, unspecified site      Past Surgical History:  Procedure Laterality Date   AMPUTATION Left 04/10/2018   Procedure: LEFT BELOW KNEE AMPUTATION;  Surgeon: Harden Jerona GAILS, MD;  Location: [facility] OR;  Service: Orthopedics;  Laterality: Left;   APPENDECTOMY     APPLICATION OF WOUND VAC Left 03/31/2018   Procedure: APPLICATION OF WOUND VAC;  Surgeon: Beverley Evalene BIRCH, MD;  Location: [facility] ORS;  Service: Orthopedics;  Laterality: Left;    HARDWARE REMOVAL Left 03/31/2018   Procedure: HARDWARE REMOVAL;  Surgeon: Beverley Evalene BIRCH, MD;  Location: [facility] ORS;  Service: Orthopedics;  Laterality: Left;   IRRIGATION AND DEBRIDEMENT FOOT Left 03/31/2018   Procedure: IRRIGATION AND DEBRIDEMENT OF LEFT ANKLE;  Surgeon: Beverley Evalene BIRCH, MD;  Location: [facility] ORS;  Service: Orthopedics;  Laterality: Left;   ORIF ANKLE FRACTURE Right 06/13/2014   Procedure: OPEN REDUCTION INTERNAL FIXATION (ORIF) BIMALLEOLAR ANKLE FRACTURE;  Surgeon: Toribio JULIANNA Beverley, MD;  Location: [facility] OR;  Service: Orthopedics;  Laterality: Right;     Initial Focused Assessment (If applicable, or please see trauma documentation): Airway: Intact, patent Breathing: Breath sounds clear, equal bilaterally.  Circulation: Bruising and small hematoma, lac/abrasion noted to left side of pt's head. Bleeding controlled. Pulses intact throughout. L BKA.  Disability: MAE w/ exception to L BKA; equal sensation throughout. PERRLA. A/Ox4.   CT's Completed:   CT Head and CT C-Spine  Added CAP w/ contrast after initial CTs  Interventions:  CXR Pelvic XR 20G PIV to R AC Labs drawn Undressed and assessed  Cleansed head wound and applied bacitracin  CT pan scan  Plan for disposition:  Other Awaiting scan results  Consults completed:  none at 1900.  Event Summary: BIB [facility] from [facility] after having a fall from her wheelchair.  Pt was outside, went over a curb and fell out of WC onto her  left side. Pt denies LOC but reports left side chest pain and left side hip pain. Pt is on eliquis  for afib. Pt has DNR paperwork with her.   Bedside handoff with ED RN Raymar and Scarlett.    LEBRON ROCKIE ORN  Trauma Response RN  Please call TRN at [phone] for further assistance.

## 2024-05-02 NOTE — ED Notes (Signed)
 CCMD called by this RN

## 2024-05-02 NOTE — Discharge Instructions (Addendum)
 Orthopedic Surgery Discharge Instructions  Patient name: Ana Franco Fracture: left pelvic ring injury Orthopedist: Ozell Ada, MD  Activity: You are allowed to put as much weight on your left leg as much as you would like. You can weight bear as tolerated on that side.   Reasons to Call the Office After Surgery: You should feel free to call the office with any concerns or questions you have in the post-operative period, but you should definitely notify the office if you develop: -shortness of breath, chest pain, or trouble breathing -persistent nausea or vomiting -worsening leg/groin pain -new falls or injuries -new weakness in either lower extremity, new or worsening numbness or tingling in either lower extremity -other concerns about your surgery  Follow Up Appointments: You have a follow up appointment scheduled with Dr. Ada on 05/20/2024 at 2:15pm. The office location and phone number are listed below. Please arrive on time to your appointment.   Office Information:  -Ozell Ada, MD -Phone number: [phone] -Address: [address]       [place], [place] [address]    Regarding you left greater tuberosity of the left proximal humerus fracture. Please continue your sling and remain non-weight bearing to the left upper extremity. Follow up with Dr. Kendal in 2 weeks.   Please follow up with your primary care provider regarding incidental findings noted on your CT to determine if any further workup is indicated.  - Mild mediastinal lymphadenopathy - Mild ground glass opacities in the inferior left upper lobe - 16 mm mildly hyperdense lesion in the left kidney - Bladder wall trabeculation with multiple bladder diverticula - Colonic diverticulosis - Cardiomegaly

## 2024-05-02 NOTE — Progress Notes (Signed)
 Orthopedic Tech Progress Note Patient Details:  Ana Franco [birth year] [id number]  Patient ID: Ana Franco, female   DOB: October 17, 1925, 89 y.o.   MRN: [id number] Level II; not currently needed. [name] 05/02/2024, 5:49 PM

## 2024-05-03 ENCOUNTER — Other Ambulatory Visit: Payer: Self-pay

## 2024-05-03 ENCOUNTER — Encounter (HOSPITAL_COMMUNITY): Payer: Self-pay

## 2024-05-03 ENCOUNTER — Inpatient Hospital Stay (HOSPITAL_COMMUNITY)

## 2024-05-03 DIAGNOSIS — S32810A Multiple fractures of pelvis with stable disruption of pelvic ring, initial encounter for closed fracture: Secondary | ICD-10-CM | POA: Diagnosis not present

## 2024-05-03 LAB — CBC
HCT: 28.3 % — ABNORMAL LOW (ref 36.0–46.0)
HCT: 30.3 % — ABNORMAL LOW (ref 36.0–46.0)
Hemoglobin: 9.1 g/dL — ABNORMAL LOW (ref 12.0–15.0)
Hemoglobin: 9.8 g/dL — ABNORMAL LOW (ref 12.0–15.0)
MCH: 29.5 pg (ref 26.0–34.0)
MCH: 29.8 pg (ref 26.0–34.0)
MCHC: 32.2 g/dL (ref 30.0–36.0)
MCHC: 32.3 g/dL (ref 30.0–36.0)
MCV: 91.9 fL (ref 80.0–100.0)
MCV: 92.1 fL (ref 80.0–100.0)
Platelets: 114 K/uL — ABNORMAL LOW (ref 150–400)
Platelets: 127 K/uL — ABNORMAL LOW (ref 150–400)
RBC: 3.08 MIL/uL — ABNORMAL LOW (ref 3.87–5.11)
RBC: 3.29 MIL/uL — ABNORMAL LOW (ref 3.87–5.11)
RDW: 14.2 % (ref 11.5–15.5)
RDW: 14.2 % (ref 11.5–15.5)
WBC: 7.9 K/uL (ref 4.0–10.5)
WBC: 10.5 K/uL (ref 4.0–10.5)
nRBC: 0 % (ref 0.0–0.2)
nRBC: 0 % (ref 0.0–0.2)

## 2024-05-03 LAB — BASIC METABOLIC PANEL WITH GFR
Anion gap: 11 (ref 5–15)
BUN: 20 mg/dL (ref 8–23)
CO2: 21 mmol/L — ABNORMAL LOW (ref 22–32)
Calcium: 9 mg/dL (ref 8.9–10.3)
Chloride: 100 mmol/L (ref 98–111)
Creatinine, Ser: 1.4 mg/dL — ABNORMAL HIGH (ref 0.44–1.00)
GFR, Estimated: 34 mL/min — ABNORMAL LOW (ref >60)
Glucose, Bld: 132 mg/dL — ABNORMAL HIGH (ref 70–99)
Potassium: 4.1 mmol/L (ref 3.5–5.1)
Sodium: 132 mmol/L — ABNORMAL LOW (ref 135–145)

## 2024-05-03 LAB — PHOSPHORUS: Phosphorus: 4.5 mg/dL (ref 2.5–4.6)

## 2024-05-03 LAB — MAGNESIUM: Magnesium: 1.8 mg/dL (ref 1.7–2.4)

## 2024-05-03 MED ORDER — POLYETHYLENE GLYCOL 3350 17 G PO PACK
17.0000 g | PACK | Freq: Every day | ORAL | Status: DC
  Administered 2024-05-03 – 2024-05-04 (×2): 17 g via ORAL
  Filled 2024-05-03 (×3): qty 1

## 2024-05-03 MED ORDER — ACETAMINOPHEN 500 MG PO TABS
1000.0000 mg | ORAL_TABLET | Freq: Four times a day (QID) | ORAL | Status: DC
  Administered 2024-05-03 – 2024-05-06 (×10): 1000 mg via ORAL
  Filled 2024-05-03 (×10): qty 2

## 2024-05-03 MED ORDER — OXYCODONE HCL 5 MG PO TABS: 2.5000 mg | ORAL_TABLET | ORAL | Status: DC | PRN

## 2024-05-03 NOTE — Evaluation (Signed)
 Occupational Therapy Evaluation Patient Details Name: Ana Franco MRN: [id number] DOB: [birth year] Today's Date: 05/03/2024   History of Present Illness   89 yo female s/p fall out of wc at [facility]. Pt sustained L superior and inferior pubic rami fx with extraperitoneal hemorrhage, IM hemorrhage L iliacus, L lateral 7th rib fx. Nonop, WBAT LLE. PMH includes L BKA 2019, afib on Eliquis , HTN, CHF, CAD, CKD, TIA/CVA, HLD, MI, depression, R ankle ORIF 2015.     Clinical Impressions Patient admitted for the diagnosis above.  PTA she resides at a local ALF, and has assist as needed for transfers, toileting, and ADL.  If the facility can provide the needed +2 Mod A for transfers, she could transition back to her apartment.  If not, Patient will benefit from continued inpatient follow up therapy, <3 hours/day.  OT will continue efforts in the acute setting to address deficits.       If plan is discharge home, recommend the following:   Assistance with cooking/housework;Assist for transportation;A lot of help with bathing/dressing/bathroom;A lot of help with walking and/or transfers     Functional Status Assessment   Patient has had a recent decline in their functional status and demonstrates the ability to make significant improvements in function in a reasonable and predictable amount of time.     Equipment Recommendations   None recommended by OT     Recommendations for Other Services         Precautions/Restrictions   Precautions Precautions: Fall Recall of Precautions/Restrictions: Intact Precaution/Restrictions Comments: L prosthetic Restrictions Weight Bearing Restrictions Per Provider Order: No     Mobility Bed Mobility Overal bed mobility: Needs Assistance Bed Mobility: Supine to Sit     Supine to sit: +2 for physical assistance, Min assist          Transfers Overall transfer level: Needs assistance   Transfers: Sit to/from Stand, Bed to  chair/wheelchair/BSC Sit to Stand: Mod assist     Step pivot transfers: Mod assist, +2 physical assistance            Balance Overall balance assessment: Needs assistance Sitting-balance support: Feet supported, Bilateral upper extremity supported Sitting balance-Leahy Scale: Good     Standing balance support: Bilateral upper extremity supported Standing balance-Leahy Scale: Poor                             ADL either performed or assessed with clinical judgement   ADL Overall ADL's : Needs assistance/impaired Eating/Feeding: Set up;Sitting   Grooming: Set up;Sitting   Upper Body Bathing: Moderate assistance;Sitting   Lower Body Bathing: Maximal assistance;Sit to/from stand   Upper Body Dressing : Moderate assistance;Sitting   Lower Body Dressing: Maximal assistance;Sit to/from stand   Toilet Transfer: Rolling walker (2 wheels);+2 for physical assistance;Moderate assistance Toilet Transfer Details (indicate cue type and reason): Assist with RW management and to advance feet for pivotal steps                 Vision Patient Visual Report: No change from baseline       Perception Perception: Not tested       Praxis Praxis: Not tested       Pertinent Vitals/Pain Pain Assessment Pain Assessment: Faces Faces Pain Scale: Hurts even more Pain Location: L arm and L hip/leg Pain Descriptors / Indicators: Grimacing, Sore, Tender Pain Intervention(s): Monitored during session     Extremity/Trunk Assessment Upper Extremity Assessment Upper Extremity Assessment: Generalized  weakness;Right hand dominant;RUE deficits/detail;LUE deficits/detail RUE Deficits / Details: Reduced shoulder flexion RUE Sensation: WNL RUE Coordination: WNL LUE: Shoulder pain with ROM LUE Sensation: WNL LUE Coordination: WNL   Lower Extremity Assessment Lower Extremity Assessment: Defer to PT evaluation   Cervical / Trunk Assessment Cervical / Trunk Assessment:  Kyphotic   Communication Communication Communication: No apparent difficulties Factors Affecting Communication: Hearing impaired   Cognition Arousal: Alert Behavior During Therapy: WFL for tasks assessed/performed Cognition: No apparent impairments                               Following commands: Intact       Cueing  General Comments       VSS on RA   Exercises     Shoulder Instructions      Home Living Family/patient expects to be discharged to:: Assisted living                             Home Equipment: Rolling Walker (2 wheels);Wheelchair - manual;Shower seat;Grab bars - toilet;Grab bars - tub/shower;Hand held shower head          Prior Functioning/Environment Prior Level of Function : Needs assist             Mobility Comments: ambulating short distances to/from bathroom with RW and LLE prosthetic and assist, uses lift chair to stand ADLs Comments: does not go to dining hall, typically eats in room (meals provided). staff assists with bathing on shower seat 2x/wk, LB dressing daily. pt reports assist with toileting    OT Problem List: Decreased range of motion;Impaired balance (sitting and/or standing);Pain   OT Treatment/Interventions: Self-care/ADL training;Therapeutic activities;DME and/or AE instruction;Balance training;Patient/family education      OT Goals(Current goals can be found in the care plan section)   Acute Rehab OT Goals Patient Stated Goal: Return home OT Goal Formulation: With patient Time For Goal Achievement: 05/17/24 Potential to Achieve Goals: Good ADL Goals Pt Will Perform Grooming: with set-up;sitting Pt Will Transfer to Toilet: with min assist;stand pivot transfer;bedside commode;regular height toilet   OT Frequency:  Min 2X/week    Co-evaluation PT/OT/SLP Co-Evaluation/Treatment: Yes Reason for Co-Treatment: Complexity of the patient's impairments (multi-system involvement)   OT goals  addressed during session: ADL's and self-care      AM-PAC OT 6 Clicks Daily Activity     Outcome Measure Help from another person eating meals?: A Little Help from another person taking care of personal grooming?: A Little Help from another person toileting, which includes using toliet, bedpan, or urinal?: A Lot Help from another person bathing (including washing, rinsing, drying)?: A Lot Help from another person to put on and taking off regular upper body clothing?: A Lot Help from another person to put on and taking off regular lower body clothing?: A Lot 6 Click Score: 14   End of Session Equipment Utilized During Treatment: Gait belt Nurse Communication: Mobility status  Activity Tolerance: Patient tolerated treatment well Patient left: in chair;with call bell/phone within reach;with chair alarm set  OT Visit Diagnosis: Unsteadiness on feet (R26.81);Pain Pain - Right/Left: Left Pain - part of body: Leg                Time: 8843-8779 OT Time Calculation (min): 24 min Charges:  OT General Charges $OT Visit: 1 Visit OT Evaluation $OT Eval Moderate Complexity: 1 Mod  05/03/2024  RP, OTR/L  Acute Rehabilitation Services  Office:  [phone]   Ana Franco 05/03/2024, 1:07 PM

## 2024-05-03 NOTE — Progress Notes (Signed)
 I spoke with Dr. Francyne of Cardiology regarding patients anticoagulation. Dr. Francyne recommended holding anticoagulation at d/c and they will make an appointment in their office to discuss with patient and family as an outpatient if she should continue to hold this vs resume her Eliquis .   Ozell CHRISTELLA Shaper , [facility] Surgery 05/03/2024, 4:09 PM Please see Amion for pager number during day hours 7:00am-4:30pm

## 2024-05-03 NOTE — Plan of Care (Signed)

## 2024-05-03 NOTE — ED Notes (Signed)
 Patient's brief change d/t episode of urinary incontinence. Peri care performed.

## 2024-05-03 NOTE — Progress Notes (Signed)
 Patient is an admission with multiple fractures. She is alert/oriented x4, no complain of pain or distress.

## 2024-05-03 NOTE — TOC Progression Note (Signed)
 Transition of Care [facility]) - Progression Note    Patient Details  Name: Ana Franco MRN: [id number] Date of Birth: [birth year]  Transition of Care [facility]) CM/SW Contact  Kervin Bones E Jaylinn Hellenbrand, LCSW Phone Number: 05/03/2024, 4:01 PM  Clinical Narrative:    Patient will need SNF upon return to [facility]. Per trauma rounds, patient expected to be medically ready as soon as tomorrow 9/23. CSW started insurance auth for SNF in [facility] Portal.   Expected Discharge Plan: Skilled Nursing Facility Barriers to Discharge: Continued Medical Work up               Expected Discharge Plan and Services       Living arrangements for the past 2 months: Assisted Living Facility                                       Social Drivers of Health (SDOH) Interventions SDOH Screenings   Food Insecurity: No Food Insecurity (05/03/2024)  Housing: Low Risk  (05/03/2024)  Transportation Needs: Unmet Transportation Needs (05/03/2024)  Utilities: Not At Risk (05/03/2024)  Social Connections: Moderately Integrated (05/03/2024)  Tobacco Use: Low Risk  (03/01/2024)    Readmission Risk Interventions    05/03/2024    9:44 AM  Readmission Risk Prevention Plan  Transportation Screening Complete  PCP or Specialist Appt within 3-5 Days Complete  HRI or Home Care Consult Complete  Social Work Consult for Recovery Care Planning/Counseling Complete  Palliative Care Screening Not Applicable  Medication Review Oceanographer) Complete

## 2024-05-03 NOTE — Progress Notes (Signed)
 Discussed the difficult decision surrounding continued anticoagulation with her surgical team. This is not the first fall with serious injury that Ana Franco has experienced.  In addition, she is at increased risk for future falls due to age, frailty and previous left below the knee amputation.  Current pelvic fractures may also increase her risk of another fall in the near future. On the other hand, she has a high risk for future embolic stroke with a CHA2DS2-VASc score of 7. She is on dose adjusted Eliquis  (advanced age and elevated creatinine). Family is not currently present.  The patient resides in a nursing facility. At this point in time, I think the best recommendation is to withhold anticoagulation due to high risk of serious injury and bleeding complications.  I think however it is important for the patient and family members to sit down and discuss the pros and cons of anticoagulation therapy going forward. We will make arrangements for an appointment.

## 2024-05-03 NOTE — Evaluation (Signed)
 Physical Therapy Evaluation Patient Details Name: Ana Franco MRN: [id number] DOB: 1925-10-11 Today's Date: 05/03/2024  History of Present Illness  89 yo female s/p fall out of wc at [facility]. Pt sustained L superior and inferior pubic rami fx with extraperitoneal hemorrhage, IM hemorrhage L iliacus, L lateral 7th rib fx. Nonop, WBAT LLE. LUE xray negative for fx. PMH includes L BKA 2019, afib on Eliquis , HTN, CHF, CAD, CKD, TIA/CVA, HLD, MI, depression, R ankle ORIF 2015.  Clinical Impression   Pt presents with L pelvic and LUE pain, impaired balance with history of falls, mod difficulty mobilizing, and decreased activity tolerance. Pt to benefit from acute PT to address deficits. Pt requiring mod +2 assist for bed mobility and transfer OOB to recliner, Pt donned LLE prosthesis for transfer and required max support to progress LLE during pivot. Pt from ALF, may need short-term inpatient rehabilitation to return to PLOF depending on how much support ALF is able to provide. PT to progress mobility as tolerated, and will continue to follow acutely.          If plan is discharge home, recommend the following: A lot of help with walking and/or transfers;A lot of help with bathing/dressing/bathroom   Can travel by private vehicle        Equipment Recommendations None recommended by PT  Recommendations for Other Services       Functional Status Assessment Patient has had a recent decline in their functional status and demonstrates the ability to make significant improvements in function in a reasonable and predictable amount of time.     Precautions / Restrictions Precautions Precautions: Fall Recall of Precautions/Restrictions: Intact Precaution/Restrictions Comments: L prosthetic Restrictions Weight Bearing Restrictions Per Provider Order: No Other Position/Activity Restrictions: WBAT      Mobility  Bed Mobility Overal bed mobility: Needs Assistance Bed Mobility: Supine to Sit      Supine to sit: +2 for physical assistance, Mod assist     General bed mobility comments: assist for LE progression and completion of trunk elevation, increased time and step-wise sequencing cues.    Transfers Overall transfer level: Needs assistance Equipment used: None Transfers: Sit to/from Stand, Bed to chair/wheelchair/BSC Sit to Stand: Mod assist   Step pivot transfers: Mod assist, +2 physical assistance       General transfer comment: assist for power up, rise, steady, pivotal steps with assist for weight shift and LLE progression during swing phase. max multimodal cuing and RW management    Ambulation/Gait               General Gait Details: nt  Acupuncturist Bed    Modified Rankin (Stroke Patients Only)       Balance Overall balance assessment: Needs assistance, History of Falls Sitting-balance support: Feet supported, Bilateral upper extremity supported Sitting balance-Leahy Scale: Good     Standing balance support: Bilateral upper extremity supported, Reliant on assistive device for balance Standing balance-Leahy Scale: Poor                               Pertinent Vitals/Pain Pain Assessment Pain Assessment: Faces Faces Pain Scale: Hurts even more Pain Location: L arm and L hip/leg Pain Descriptors / Indicators: Grimacing, Sore, Tender Pain Intervention(s): Limited activity within patient's tolerance, Monitored during session, Repositioned    Home Living Family/patient expects to be discharged  to:: Assisted living                 Home Equipment: Agricultural consultant (2 wheels);Wheelchair - manual;Shower seat;Grab bars - toilet;Grab bars - tub/shower;Hand held shower head      Prior Function Prior Level of Function : Needs assist             Mobility Comments: ambulating short distances to/from bathroom with RW and LLE prosthetic and assist, uses lift chair to stand ADLs Comments:  does not go to dining hall, typically eats in room (meals provided). staff assists with bathing on shower seat 2x/wk, LB dressing daily. pt reports assist with toileting     Extremity/Trunk Assessment   Upper Extremity Assessment Upper Extremity Assessment: Defer to OT evaluation RUE Deficits / Details: Reduced shoulder flexion RUE Sensation: WNL RUE Coordination: WNL LUE Deficits / Details: pain with ROM and WB LUE: Shoulder pain with ROM LUE Sensation: WNL LUE Coordination: WNL    Lower Extremity Assessment Lower Extremity Assessment: Generalized weakness;LLE deficits/detail LLE Deficits / Details: L BKA, able to perform full knee extension, hip and knee flexion with min pain from pelvis    Cervical / Trunk Assessment Cervical / Trunk Assessment: Kyphotic  Communication   Communication Communication: No apparent difficulties Factors Affecting Communication: Hearing impaired    Cognition Arousal: Alert Behavior During Therapy: WFL for tasks assessed/performed   PT - Cognitive impairments: No apparent impairments                         Following commands: Intact       Cueing Cueing Techniques: Gestural cues, Verbal cues     General Comments General comments (skin integrity, edema, etc.): hemosideran staining, chronic in nature, R lower leg    Exercises     Assessment/Plan    PT Assessment Patient needs continued PT services  PT Problem List Decreased strength;Decreased mobility;Decreased activity tolerance;Decreased balance;Decreased knowledge of use of DME;Pain;Cardiopulmonary status limiting activity;Decreased safety awareness;Decreased range of motion       PT Treatment Interventions DME instruction;Therapeutic activities;Gait training;Therapeutic exercise;Patient/family education;Balance training;Functional mobility training;Neuromuscular re-education    PT Goals (Current goals can be found in the Care Plan section)  Acute Rehab PT  Goals Patient Stated Goal: return to [facility] PT Goal Formulation: With patient/family Time For Goal Achievement: 05/17/24 Potential to Achieve Goals: Good    Frequency Min 2X/week     Co-evaluation   Reason for Co-Treatment: Complexity of the patient's impairments (multi-system involvement) PT goals addressed during session: Mobility/safety with mobility;Balance OT goals addressed during session: ADL's and self-care       AM-PAC PT 6 Clicks Mobility  Outcome Measure Help needed turning from your back to your side while in a flat bed without using bedrails?: A Lot Help needed moving from lying on your back to sitting on the side of a flat bed without using bedrails?: A Lot Help needed moving to and from a bed to a chair (including a wheelchair)?: A Lot Help needed standing up from a chair using your arms (e.g., wheelchair or bedside chair)?: A Lot Help needed to walk in hospital room?: Total Help needed climbing 3-5 steps with a railing? : Total 6 Click Score: 10    End of Session Equipment Utilized During Treatment: Gait belt Activity Tolerance: Patient tolerated treatment well Patient left: in chair;with call bell/phone within reach;with chair alarm set Nurse Communication: Mobility status PT Visit Diagnosis: Other abnormalities of gait and mobility (  R26.89);Muscle weakness (generalized) (M62.81)    Time: 8843-8779 PT Time Calculation (min) (ACUTE ONLY): 24 min   Charges:   PT Evaluation $PT Eval Low Complexity: 1 Low   PT General Charges $$ ACUTE PT VISIT: 1 Visit         Johana RAMAN, PT DPT Acute Rehabilitation Services Secure Chat Preferred  Office [phone]   Lillymae Duet [place] 05/03/2024, 2:48 PM

## 2024-05-03 NOTE — Progress Notes (Signed)
 Subjective: CC: Seen with PT/OT/RN.   Pateint complains of left elbow and left pelvic pain. No rib pain. No sob. Weaned off o2. She is tolerating cld without n/v. No flatus or BM. Voiding. Has not been oob.   Uses L prothesis for transfers normally. Lives at ALF.   Afebrile. No tachycardia or systolic hypotension. Hgb 9.1 from 11.2  Objective: Vital signs in last 24 hours: Temp:  [97.5 F (36.4 C)-98.9 F (37.2 C)] 97.5 F (36.4 C) (09/22 0802) Pulse Rate:  [60-91] 60 (09/22 0802) Resp:  [14-24] 15 (09/22 0802) BP: (100-140)/(49-97) 139/57 (09/22 0802) SpO2:  [88 %-97 %] 96 % (09/22 0802) Weight:  [72.8 kg-82 kg] 72.8 kg (09/22 0306) Last BM Date : 05/02/24 (per patient)  Intake/Output from previous day: 09/21 0701 - 09/22 0700 In: 220.3 [P.O.:60; I.V.:60.3; IV Piggyback:100] Out: -  Intake/Output this shift: No intake/output data recorded.  PE: Gen:  Alert, NAD, pleasant Card:  Irregular rhythm with regular rate Pulm:  CTAB, no W/R/R, effort normal Abd: Soft, ND, NT Ext:  L elbow ttp. L hip ttp. L BKA. Chronic venous changes of RLE.  Neuro: CN 3-12 grossly intact, MAE's, f/c, non-focal.   Lab Results:  Recent Labs    05/02/24 1738 05/02/24 1743 05/03/24 0546  WBC 13.7*  --  7.9  HGB 11.1* 11.2* 9.1*  HCT 34.8* 33.0* 28.3*  PLT 176  --  114*   BMET Recent Labs    05/02/24 1738 05/02/24 1743 05/03/24 0546  NA 130* 133* 132*  K 4.4 4.4 4.1  CL 97* 97* 100  CO2 24  --  21*  GLUCOSE 121* 123* 132*  BUN 19 20 20   CREATININE 1.44* 1.70* 1.40*  CALCIUM  8.8*  --  9.0   PT/INR Recent Labs    05/02/24 1738  LABPROT 17.0*  INR 1.3*   CMP     Component Value Date/Time   NA 132 (L) 05/03/2024 0546   NA 137 06/23/2014 0000   K 4.1 05/03/2024 0546   CL 100 05/03/2024 0546   CO2 21 (L) 05/03/2024 0546   GLUCOSE 132 (H) 05/03/2024 0546   BUN 20 05/03/2024 0546   BUN 24 (A) 06/23/2014 0000   CREATININE 1.40 (H) 05/03/2024 0546   CALCIUM  9.0  05/03/2024 0546   PROT 6.6 05/02/2024 1738   ALBUMIN 3.4 (L) 05/02/2024 1738   AST 29 05/02/2024 1738   ALT 15 05/02/2024 1738   ALKPHOS 82 05/02/2024 1738   BILITOT 0.7 05/02/2024 1738   GFRNONAA 34 (L) 05/03/2024 0546   GFRAA 38 (L) 04/14/2018 1230   Lipase     Component Value Date/Time   LIPASE 44 10/21/2023 1603    Studies/Results: DG Elbow 2 Views Left Result Date: 05/03/2024 EXAM: 1 or 2 VIEW(S) XRAY OF THE LEFT ELBOW COMPARISON: None available. CLINICAL HISTORY: Left elbow/ arm pain. FINDINGS: BONES AND JOINTS: No acute fracture. No focal osseous lesion. No joint dislocation. No joint effusion. SOFT TISSUES: The soft tissues are unremarkable. IMPRESSION: 1. No acute abnormality. Electronically signed by: Waddell Calk MD 05/03/2024 08:59 AM EDT RP Workstation: [id number]   CT CHEST ABDOMEN PELVIS W CONTRAST Result Date: 05/02/2024 CLINICAL DATA:  Trauma.  Possible left-sided rib fractures. EXAM: CT CHEST, ABDOMEN, AND PELVIS WITH CONTRAST TECHNIQUE: Multidetector CT imaging of the chest, abdomen and pelvis was performed following the standard protocol during bolus administration of intravenous contrast. RADIATION DOSE REDUCTION: This exam was performed according to the departmental dose-optimization  program which includes automated exposure control, adjustment of the mA and/or kV according to patient size and/or use of iterative reconstruction technique. CONTRAST:  75mL OMNIPAQUE  IOHEXOL  350 MG/ML SOLN COMPARISON:  None Available. FINDINGS: CT CHEST FINDINGS Cardiovascular: There is moderate severe cardiomegaly. There is no pericardial effusion. The aorta is normal in size. There are atherosclerotic calcifications of the. Mediastinum/Nodes: There is an enlarged subcarinal lymph node measuring 13 mm. Visualized thyroid  gland and esophagus are within normal limits. There is a small hiatal hernia. Lungs/Pleura: There scattered mild peripheral reticular opacities throughout both lungs. There  some mild ground-glass opacities in the inferior left upper lobe. There some chronic bronchiectasis in the right lower lobe. There is no focal lung consolidation, pleural effusion or pneumothorax. There scattered 1-2 mm nodular densities throughout both lungs. Musculoskeletal: There is an acute lateral left seventh rib fracture. There are healed bilateral rib fractures. The bones are diffusely osteopenic. Degenerative changes affect the spine. CT ABDOMEN PELVIS FINDINGS Hepatobiliary: No hepatic injury or perihepatic hematoma. Gallbladder is unremarkable. There is a subcentimeter hypodensity in the right lobe of the liver which is too small to characterize, likely cysts. Pancreas: Unremarkable. No pancreatic ductal dilatation or surrounding inflammatory changes. Spleen: No splenic injury or perisplenic hematoma. Adrenals/Urinary Tract: There is no adrenal hemorrhage or renal injury identified. There is no hydronephrosis. There scattered bilateral renal cysts measuring up to 16 mm. Mildly hyperdense rounded lesion in the left kidney measures 16 mm in diameter on image 6/16. There is bladder wall trabeculation diffusely. Multiple bladder diverticula are seen. Stomach/Bowel: Stomach is within normal limits. Appendix appears normal. No evidence of bowel wall thickening, distention, or inflammatory changes. There is sigmoid colon diverticulosis. Vascular/Lymphatic: There are extensive vascular calcifications of the aorta and iliac arteries. Aorta is normal in size. IVC is normal in size. No enlarged lymph nodes are identified. Reproductive: Uterus is surgically absent. Adnexa are within normal limits. Other: There is extraperitoneal anterior left pelvic hemorrhage adjacent to fractures measuring up to 18 mm in thickness. There is also asymmetric enlargement of the left iliacus muscle likely related to intramuscular hemorrhage from fracture. There is no ascites or focal abdominal wall hernia. Musculoskeletal: There are  acute left superior and inferior pubic rami fractures which are nondisplaced. Pubic symphysis appears intact. There are questionable healed left sacral ala fractures. Sacroiliac joints appear intact. IMPRESSION: 1. Acute left superior and inferior pubic rami fractures. 2. Extraperitoneal hemorrhage in the anterior left pelvis adjacent to fractures. 3. Asymmetric enlargement of the left iliacus muscle likely related to intramuscular hemorrhage from fracture. 4. Acute lateral left seventh rib fracture. 5. No pneumothorax or pleural effusion. 6. No acute posttraumatic sequelae in the abdomen or pelvis. 7. Cardiomegaly. 8. Mild mediastinal lymphadenopathy, indeterminate. 9. Mild ground-glass opacities in the inferior left upper lobe may be infectious/inflammatory. 10. 16 mm mildly hyperdense lesion in the left kidney. Recommend further evaluation with ultrasound. 11. Bladder wall trabeculation with multiple bladder diverticula. 12. Colonic diverticulosis. 13. Aortic atherosclerosis. Aortic Atherosclerosis (ICD10-I70.0). Electronically Signed   By: Greig Pique M.D.   On: 05/02/2024 20:28   CT Head Wo Contrast Result Date: 05/02/2024 CLINICAL DATA:  Polytrauma, blunt.  Fall. EXAM: CT HEAD WITHOUT CONTRAST TECHNIQUE: Contiguous axial images were obtained from the base of the skull through the vertex without intravenous contrast. RADIATION DOSE REDUCTION: This exam was performed according to the departmental dose-optimization program which includes automated exposure control, adjustment of the mA and/or kV according to patient size and/or use of iterative reconstruction  technique. COMPARISON:  03/20/2022 FINDINGS: Brain: There is atrophy and chronic small vessel disease changes. No acute intracranial abnormality. Specifically, no hemorrhage, hydrocephalus, mass lesion, acute infarction, or significant intracranial injury. Vascular: No hyperdense vessel or unexpected calcification. Skull: No acute calvarial abnormality.  Sinuses/Orbits: No acute findings Other: Soft tissue swelling in the left temporal region. IMPRESSION: Atrophy, chronic microvascular disease. No acute intracranial abnormality. Electronically Signed   By: Franky Crease M.D.   On: 05/02/2024 18:21   CT Cervical Spine Wo Contrast Result Date: 05/02/2024 CLINICAL DATA:  Polytrauma, blunt.  Fall. EXAM: CT CERVICAL SPINE WITHOUT CONTRAST TECHNIQUE: Multidetector CT imaging of the cervical spine was performed without intravenous contrast. Multiplanar CT image reconstructions were also generated. RADIATION DOSE REDUCTION: This exam was performed according to the departmental dose-optimization program which includes automated exposure control, adjustment of the mA and/or kV according to patient size and/or use of iterative reconstruction technique. COMPARISON:  03/20/2022 FINDINGS: Alignment: No subluxation. Skull base and vertebrae: No acute fracture. No primary bone lesion or focal pathologic process. Soft tissues and spinal canal: No prevertebral fluid or swelling. No visible canal hematoma. Disc levels: Moderate to advanced multilevel degenerative disc and facet disease. Upper chest: No acute findings Other: None IMPRESSION: Multilevel degenerative changes.  No acute bony abnormality. Electronically Signed   By: Franky Crease M.D.   On: 05/02/2024 18:19   DG Chest Port 1 View Result Date: 05/02/2024 CLINICAL DATA:  Trauma, fall. EXAM: PORTABLE CHEST 1 VIEW COMPARISON:  11/19/2023. FINDINGS: The heart is enlarged and mitral valve calcifications are noted. There is atherosclerotic calcification of the aorta. The visualized mediastinal contour is within normal limits. No consolidation, effusion, or pneumothorax is seen. Old rib fractures are noted on the right. There is a fracture of the T6 rib on the left, indeterminate in age. IMPRESSION: 1. No active disease. 2. Fracture of the T6 rib on the left, indeterminate in age. Electronically Signed   By: Leita Birmingham M.D.    On: 05/02/2024 17:59   DG Pelvis 1-2 Views Result Date: 05/02/2024 CLINICAL DATA:  Fall EXAM: PELVIS - 1-2 VIEW COMPARISON:  None Available. FINDINGS: The bones are diffusely osteopenic. Examination is limited secondary to patient rotation. Can not exclude nondisplaced fractures of the left superior and inferior pubic rami. No other fractures are visualized. There is no dislocation. Mild degenerative changes affect hips. There is a extensive atherosclerotic calcification throughout the aorta and iliac arteries. IMPRESSION: 1. Can not exclude nondisplaced fractures of the left superior and inferior pubic rami. Recommend further evaluation with CT. 2. Osteopenia. Electronically Signed   By: Greig Pique M.D.   On: 05/02/2024 17:58   DG Shoulder Left Portable Result Date: 05/02/2024 CLINICAL DATA:  Fall EXAM: LEFT SHOULDER COMPARISON:  None Available. FINDINGS: The bones are diffusely osteopenic. There is no evidence of fracture or dislocation. There is no evidence of arthropathy or other focal bone abnormality. Soft tissues are unremarkable. IMPRESSION: Negative. Electronically Signed   By: Greig Pique M.D.   On: 05/02/2024 17:56    Anti-infectives: Anti-infectives (From admission, onward)    None        Assessment/Plan S/p mechanical fall on Eliquis  9/21 Left superior and inferior pubic rami fractures with associated extraperitoneal hemorrhage and intramuscular hemorrhage of left iliacus - Per Ortho, Dr. Ozell Ada, non-op, WBAT. Hgb 9.1 from 11.2. Repeat CBC in PM. Hold Eliquis .  Left lateral 7th rib fracture - multimodal pain control, pulm toilet.  ABL anemia - as above.  Afib  on Eliquis  - hold here. Will need to reach out to prescribing provider to see if this should be restarted vs discontinued at d/c with recent history of a fall. Follows with Dr. Shelda Bruckner as outpatient.  Hx HTN - 1/2 home dose Metoprolol  (12.5 BID from 25 BID), can increase to home dose in AM if BP  stable overnight. Hold on increasing today.  Hx HLD - home meds  Hx CAD Hx CHF Hx CVA L BKA  Incidental Findings - Mild mediastinal lymphadenopathy, Mild ground glass opacities in the inferior left upper lobe, 16 mm mildly hyperdense lesion in the left kidney, Bladder wall trabeculation with multiple bladder diverticula, Colonic diverticulosis, Cardiomegaly FEN - Reg, SLIV, bowel regimen VTE - SCDs, hold Eliquis . Hold DVT ppx as well for ABL anemia. Consider restarting tomorrow if hgb stable.  ID - None Foley - None, spont void. Bladder scan prn and low threshold for foley with above injuries.  Plan - As above. Progressive. Adv diet. Therapies.   I reviewed nursing notes, Consultant (Ortho) notes, last 24 h vitals and pain scores, last 48 h intake and output, last 24 h labs and trends, and last 24 h imaging results.     LOS: 1 day    Ozell CHRISTELLA Shaper, [facility] Surgery 05/03/2024, 11:40 AM Please see Amion for pager number during day hours 7:00am-4:30pm

## 2024-05-03 NOTE — TOC Initial Note (Signed)
 Transition of Care [facility]) - Initial/Assessment Note    Patient Details  Name: Ana Franco MRN: [id number] Date of Birth: [birth year]  [facility]) CM/SW Contact:    Demitrios Molyneux E Jarion Hawthorne, LCSW Phone Number: 05/03/2024, 9:45 AM  Clinical Narrative:                 Patient was admitted post fall. CSW spoke with patient's son Marcey and [facility] Admissions Coordinator Kristin. Patient resides at [facility] in their ALF, plan to return when medically stable. Patient can go to their SNF level of care for STR if needed - will need therapy evals (which are pending) and insurance auth. ICM will continue to follow.   Expected Discharge Plan: Skilled Nursing Facility Barriers to Discharge: Continued Medical Work up   Patient Goals and CMS Choice   CMS Medicare.gov Compare Post Acute Care list provided to:: Patient Represenative (must comment) Choice offered to / list presented to : Adult Children      Expected Discharge Plan and Services       Living arrangements for the past 2 months: Assisted Living Facility                                      Prior Living Arrangements/Services Living arrangements for the past 2 months: Assisted Living Facility Lives with:: Facility Resident Patient language and need for interpreter reviewed:: Yes Do you feel safe going back to the place where you live?: Yes      Need for Family Participation in Patient Care: Yes (Comment) Care giver support system in place?: Yes (comment)   Criminal Activity/Legal Involvement Pertinent to Current Situation/Hospitalization: No - Comment as needed  Activities of Daily Living   ADL Screening (condition at time of admission) Independently performs ADLs?: No Does the patient have a NEW difficulty with bathing/dressing/toileting/self-feeding that is expected to last >3 days?: Yes (Initiates electronic notice to provider for possible OT consult) Does the patient have a NEW difficulty with  getting in/out of bed, walking, or climbing stairs that is expected to last >3 days?: Yes (Initiates electronic notice to provider for possible PT consult) Does the patient have a NEW difficulty with communication that is expected to last >3 days?: No Is the patient deaf or have difficulty hearing?: No Does the patient have difficulty seeing, even when wearing glasses/contacts?: No Does the patient have difficulty concentrating, remembering, or making decisions?: No  Permission Sought/Granted Permission sought to share information with : Facility Industrial/product designer granted to share information with : Yes, Verbal Permission Granted (by son Marcey)     Permission granted to share info w AGENCY: [facility]        Emotional Assessment         Alcohol / Substance Use: Not Applicable Psych Involvement: No (comment)  Admission diagnosis:  Trauma [T14.90XA] Hypoxia [R09.02] Fall, initial encounter [W19.XXXA] Closed fracture of one rib of left side, initial encounter [S22.32XA] Patient Active Problem List   Diagnosis Date Noted   Closed pelvic ring fracture (HCC) 05/03/2024   Trauma 05/02/2024   Sepsis (HCC) 10/21/2023   Hematemesis 10/21/2023   Hyperkalemia 10/21/2023   Acute on chronic HFrEF (heart failure with reduced ejection fraction) (HCC) 10/12/2023   Acute CHF (congestive heart failure) (HCC) 10/12/2023   ARF (acute renal failure) (HCC) 10/05/2023   SIRS (systemic inflammatory response syndrome) (HCC) 10/04/2023   Anemia of chronic disease 10/04/2023  COVID-19 virus antibody detected 10/04/2023   Acute kidney injury superimposed on stage 3a chronic kidney disease (HCC) 10/03/2023   Hypokalemia 12/15/2022   Acute diastolic CHF (congestive heart failure) (HCC) 12/14/2022   Acute hyponatremia 12/14/2022   Normocytic anemia 12/14/2022   Coronary artery disease 12/14/2022   Hx of BKA, left (HCC) 12/14/2022   Permanent atrial fibrillation (HCC) 12/18/2021    H/O: CVA (cerebrovascular accident) 12/18/2021   History of non-ST elevation myocardial infarction (NSTEMI) 12/18/2021   Acute CVA (cerebrovascular accident) (HCC) 01/07/2021   Thrombocytopenia (HCC) 01/06/2021   TIA (transient ischemic attack) 01/06/2021   DNR (do not resuscitate) 01/06/2021   Chronic kidney disease CKD IIIb 01/06/2021   Paroxysmal atrial fibrillation (HCC) 05/04/2018   Gangrene of lower extremity (HCC)    Subacute osteomyelitis, left ankle and foot (HCC)    S/P ORIF (open reduction internal fixation) fracture 04/03/2018   Wound infection 04/02/2018   MRSA (methicillin resistant staph aureus) culture positive 04/02/2018   Acute blood loss anemia 04/02/2018   Closed left ankle fracture, with delayed healing, subsequent encounter 03/31/2018   Essential hypertension 06/23/2014   HLD (hyperlipidemia) 06/23/2014   GERD (gastroesophageal reflux disease) 06/23/2014   UTI (urinary tract infection) 06/23/2014   Depression 06/23/2014   PCP:  Pcp, No Pharmacy:   [facility] [address] [place] [address] Phone: [phone] Fax: [phone]  [facility] - [place], [place] - [address] [place] [address] Phone: [phone] Fax: [phone]     Social Drivers of Health (SDOH) Social History: SDOH Screenings   Food Insecurity: No Food Insecurity (05/03/2024)  Housing: Low Risk  (05/03/2024)  Transportation Needs: Unmet Transportation Needs (05/03/2024)  Utilities: Not At Risk (05/03/2024)  Social Connections: Moderately Integrated (05/03/2024)  Tobacco Use: Low Risk  (03/01/2024)   SDOH Interventions:     Readmission Risk Interventions    05/03/2024    9:44 AM  Readmission Risk Prevention Plan  Transportation Screening Complete  PCP or Specialist Appt within 3-5 Days Complete  HRI or Home Care Consult Complete  Social Work Consult for Recovery Care Planning/Counseling Complete   Palliative Care Screening Not Applicable  Medication Review Oceanographer) Complete

## 2024-05-04 ENCOUNTER — Inpatient Hospital Stay (HOSPITAL_COMMUNITY)

## 2024-05-04 ENCOUNTER — Encounter (HOSPITAL_COMMUNITY): Payer: Self-pay

## 2024-05-04 LAB — CBC
HCT: 25.8 % — ABNORMAL LOW (ref 36.0–46.0)
Hemoglobin: 8.1 g/dL — ABNORMAL LOW (ref 12.0–15.0)
MCH: 29.1 pg (ref 26.0–34.0)
MCHC: 31.4 g/dL (ref 30.0–36.0)
MCV: 92.8 fL (ref 80.0–100.0)
Platelets: 137 K/uL — ABNORMAL LOW (ref 150–400)
RBC: 2.78 MIL/uL — ABNORMAL LOW (ref 3.87–5.11)
RDW: 14.3 % (ref 11.5–15.5)
WBC: 7.7 K/uL (ref 4.0–10.5)
nRBC: 0 % (ref 0.0–0.2)

## 2024-05-04 LAB — BASIC METABOLIC PANEL WITH GFR
Anion gap: 9 (ref 5–15)
BUN: 27 mg/dL — ABNORMAL HIGH (ref 8–23)
CO2: 24 mmol/L (ref 22–32)
Calcium: 8.7 mg/dL — ABNORMAL LOW (ref 8.9–10.3)
Chloride: 97 mmol/L — ABNORMAL LOW (ref 98–111)
Creatinine, Ser: 1.6 mg/dL — ABNORMAL HIGH (ref 0.44–1.00)
GFR, Estimated: 29 mL/min — ABNORMAL LOW (ref >60)
Glucose, Bld: 115 mg/dL — ABNORMAL HIGH (ref 70–99)
Potassium: 4.9 mmol/L (ref 3.5–5.1)
Sodium: 130 mmol/L — ABNORMAL LOW (ref 135–145)

## 2024-05-04 LAB — MAGNESIUM: Magnesium: 2 mg/dL (ref 1.7–2.4)

## 2024-05-04 LAB — PHOSPHORUS: Phosphorus: 4.3 mg/dL (ref 2.5–4.6)

## 2024-05-04 MED ORDER — SODIUM CHLORIDE 0.9 % IV SOLN: INTRAVENOUS | Status: DC

## 2024-05-04 NOTE — Plan of Care (Signed)

## 2024-05-04 NOTE — TOC Progression Note (Addendum)
 Transition of Care [facility]) - Progression Note    Patient Details  Name: Ana Franco MRN: [id number] Date of Birth: 02/06/1926  [facility]) CM/SW Contact  Mylene Bow E Leif Loflin, LCSW Phone Number: 05/04/2024, 8:47 AM  Clinical Narrative:    STR auth approved through 9/25. Updated PA. Update - Patient not medically ready today, possibly tomorrow, updated Kristin at [employer].   Expected Discharge Plan: Skilled Nursing Facility Barriers to Discharge: Continued Medical Work up               Expected Discharge Plan and Services       Living arrangements for the past 2 months: Assisted Living Facility                                       Social Drivers of Health (SDOH) Interventions SDOH Screenings   Food Insecurity: No Food Insecurity (05/03/2024)  Housing: Low Risk  (05/03/2024)  Transportation Needs: Unmet Transportation Needs (05/03/2024)  Utilities: Not At Risk (05/03/2024)  Social Connections: Moderately Integrated (05/03/2024)  Tobacco Use: Low Risk  (03/01/2024)    Readmission Risk Interventions    05/03/2024    9:44 AM  Readmission Risk Prevention Plan  Transportation Screening Complete  PCP or Specialist Appt within 3-5 Days Complete  HRI or Home Care Consult Complete  Social Work Consult for Recovery Care Planning/Counseling Complete  Palliative Care Screening Not Applicable  Medication Review Oceanographer) Complete

## 2024-05-04 NOTE — [place] FL2 (Signed)
 [place]  MEDICAID FL2 LEVEL OF CARE FORM     IDENTIFICATION  Patient Name: Ana Franco Birthdate: 08-Jan-1926 Sex: female Admission Date (Current Location): 05/02/2024  [place] and [place] Number:  Producer, television/film/video and Address:  [facility], [address] [place], [place] [address]      Provider Number: [id number]  Attending Physician Name and Address:  Md, Trauma, MD  Relative Name and Phone Number:  Skarlett, Sedlacek (Son)  [phone] [facility])    Current Level of Care: Hospital Recommended Level of Care: Skilled Nursing Facility Prior Approval Number:    Date Approved/Denied:   PASRR Number: [id number]  Discharge Plan:      Current Diagnoses: Patient Active Problem List   Diagnosis Date Noted   Closed pelvic ring fracture (HCC) 05/03/2024   Trauma 05/02/2024   Sepsis (HCC) 10/21/2023   Hematemesis 10/21/2023   Hyperkalemia 10/21/2023   Acute on chronic HFrEF (heart failure with reduced ejection fraction) (HCC) 10/12/2023   Acute CHF (congestive heart failure) (HCC) 10/12/2023   ARF (acute renal failure) 10/05/2023   SIRS (systemic inflammatory response syndrome) (HCC) 10/04/2023   Anemia of chronic disease 10/04/2023   COVID-19 virus antibody detected 10/04/2023   Acute kidney injury superimposed on stage 3a chronic kidney disease (HCC) 10/03/2023   Hypokalemia 12/15/2022   Acute diastolic CHF (congestive heart failure) (HCC) 12/14/2022   Acute hyponatremia 12/14/2022   Normocytic anemia 12/14/2022   Coronary artery disease 12/14/2022   Hx of BKA, left (HCC) 12/14/2022   Permanent atrial fibrillation (HCC) 12/18/2021   H/O: CVA (cerebrovascular accident) 12/18/2021   History of non-ST elevation myocardial infarction (NSTEMI) 12/18/2021   Acute CVA (cerebrovascular accident) (HCC) 01/07/2021   Thrombocytopenia 01/06/2021   TIA (transient ischemic attack) 01/06/2021   DNR (do not resuscitate) 01/06/2021   Chronic kidney disease  CKD IIIb 01/06/2021   Paroxysmal atrial fibrillation (HCC) 05/04/2018   Gangrene of lower extremity (HCC)    Subacute osteomyelitis, left ankle and foot (HCC)    S/P ORIF (open reduction internal fixation) fracture 04/03/2018   Wound infection 04/02/2018   MRSA (methicillin resistant staph aureus) culture positive 04/02/2018   Acute blood loss anemia 04/02/2018   Closed left ankle fracture, with delayed healing, subsequent encounter 03/31/2018   Essential hypertension 06/23/2014   HLD (hyperlipidemia) 06/23/2014   GERD (gastroesophageal reflux disease) 06/23/2014   UTI (urinary tract infection) 06/23/2014   Depression 06/23/2014    Orientation RESPIRATION BLADDER Height & Weight     Self, Time, Situation, Place  Normal Incontinent, External catheter Weight: 160 lb 7.9 oz (72.8 kg) Height:  5' 8 (172.7 cm)  BEHAVIORAL SYMPTOMS/MOOD NEUROLOGICAL BOWEL NUTRITION STATUS      Incontinent Diet (reg)  AMBULATORY STATUS COMMUNICATION OF NEEDS Skin   Extensive Assist Verbally Skin abrasions                       Personal Care Assistance Level of Assistance  Bathing, Dressing, Feeding Bathing Assistance: Maximum assistance Feeding assistance: Limited assistance Dressing Assistance: Maximum assistance     Functional Limitations Info  Sight Sight Info: Impaired (glasses)        SPECIAL CARE FACTORS FREQUENCY  PT (By licensed PT), OT (By licensed OT)     PT Frequency: 5 times per week OT Frequency: 5 times per week            Contractures      Additional Factors Info  Code Status, Allergies Code Status Info:  Limited: Do not attempt resuscitation (DNR) -DNR-LIMITED -Do Not Intubate/DNI Allergies Info: nka           Current Medications (05/04/2024):  This is the current hospital active medication list Current Facility-Administered Medications  Medication Dose Route Frequency Provider Last Rate Last Admin   acetaminophen  (TYLENOL ) tablet 1,000 mg  1,000 mg Oral  Q6H Maczis, Michael M, PA-C   1,000 mg at 05/04/24 0641   citalopram  (CELEXA ) tablet 10 mg  10 mg Oral Daily Ann Fine, MD   10 mg at 05/03/24 0802   docusate sodium  (COLACE) capsule 100 mg  100 mg Oral BID Ann Fine, MD   100 mg at 05/03/24 2209   enoxaparin  (LOVENOX ) injection 30 mg  30 mg Subcutaneous Q24H Ann Fine, MD       ezetimibe  (ZETIA ) tablet 10 mg  10 mg Oral q1800 Ann Fine, MD   10 mg at 05/03/24 1720   And   simvastatin  (ZOCOR ) tablet 20 mg  20 mg Oral q1800 Ann Fine, MD   20 mg at 05/03/24 1720   gabapentin  (NEURONTIN ) capsule 100 mg  100 mg Oral TID Ann Fine, MD   100 mg at 05/03/24 2209   hydrALAZINE  (APRESOLINE ) injection 10 mg  10 mg Intravenous Q2H PRN Ann Fine, MD       HYDROmorphone  (DILAUDID ) injection 0.5 mg  0.5 mg Intravenous Q2H PRN Ann Fine, MD       lidocaine  (LIDODERM ) 5 % 1 patch  1 patch Transdermal Q24H Ann Fine, MD   1 patch at 05/03/24 2209   metoprolol  tartrate (LOPRESSOR ) injection 5 mg  5 mg Intravenous Q6H PRN Ann Fine, MD       metoprolol  tartrate (LOPRESSOR ) tablet 12.5 mg  12.5 mg Oral BID Ann Fine, MD   12.5 mg at 05/03/24 2209   ondansetron  (ZOFRAN -ODT) disintegrating tablet 4 mg  4 mg Oral Q6H PRN Ann Fine, MD       Or   ondansetron  (ZOFRAN ) injection 4 mg  4 mg Intravenous Q6H PRN Ann Fine, MD       oxyCODONE  (Oxy IR/ROXICODONE ) immediate release tablet 2.5-5 mg  2.5-5 mg Oral Q4H PRN Maczis, Michael M, PA-C       pantoprazole  (PROTONIX ) EC tablet 80 mg  80 mg Oral Q1200 Ann Fine, MD   80 mg at 05/03/24 1143   polyethylene glycol (MIRALAX  / GLYCOLAX ) packet 17 g  17 g Oral Daily Maczis, Michael M, PA-C   17 g at 05/03/24 1301     Discharge Medications: Please see discharge summary for a list of discharge medications.  Relevant Imaging Results:  Relevant Lab Results:   Additional Information SS #: [id number]  Peg Fifer E Markell Sciascia,  LCSW

## 2024-05-04 NOTE — Progress Notes (Signed)
 Physical Therapy Treatment Patient Details Name: Ana Franco MRN: [id number] DOB: 01-18-1926 Today's Date: 05/04/2024   History of Present Illness 89 yo female s/p fall out of wc at [facility]. Pt sustained L superior and inferior pubic rami fx with extraperitoneal hemorrhage, IM hemorrhage L iliacus, L lateral 7th rib fx. Nonop, WBAT LLE. LUE xray negative for fx. PMH includes L BKA 2019, afib on Eliquis , HTN, CHF, CAD, CKD, TIA/CVA, HLD, MI, depression, R ankle ORIF 2015.    PT Comments  Pt in bed upon arrival to room, agreeable to progression OOB. Pt with heavy posterior bias in sitting and standing, amenable to multimodal cuing to correct. Pt Overall requiring mod-max assist for bed mobility and x2 transfers into standing, on first stand pt limited by posterior bias and stool incontinence. Pt struggles most with dynamic standing tasks, requiring significant assist for weight shifting and stepping. Plan remains appropriate, will continue to follow.       If plan is discharge home, recommend the following: A lot of help with walking and/or transfers;A lot of help with bathing/dressing/bathroom   Can travel by private vehicle        Equipment Recommendations  None recommended by PT    Recommendations for Other Services       Precautions / Restrictions Precautions Precautions: Fall Recall of Precautions/Restrictions: Intact Precaution/Restrictions Comments: L prosthesis Restrictions Weight Bearing Restrictions Per Provider Order: No Other Position/Activity Restrictions: WBAT     Mobility  Bed Mobility Overal bed mobility: Needs Assistance Bed Mobility: Supine to Sit     Supine to sit: Mod assist     General bed mobility comments: assist for trunk elevation, LE progression to EOB, bed pad assist to scoot to EOB.    Transfers Overall transfer level: Needs assistance Equipment used: None Transfers: Sit to/from Stand Sit to Stand: Mod assist   Step pivot transfers: Max  assist       General transfer comment: assist for power up, rise, steady, pivotal steps towards R with assist for weight shift and LLE progression during swing phase. max sequencing and weight shifting cues. stand x2 from recliner    Ambulation/Gait               General Gait Details: nt   Comptroller Bed    Modified Rankin (Stroke Patients Only)       Balance Overall balance assessment: Needs assistance, History of Falls Sitting-balance support: Feet supported, Bilateral upper extremity supported Sitting balance-Leahy Scale: Good     Standing balance support: Bilateral upper extremity supported, Reliant on assistive device for balance Standing balance-Leahy Scale: Poor                              Communication Communication Communication: No apparent difficulties Factors Affecting Communication: Hearing impaired  Cognition Arousal: Alert Behavior During Therapy: WFL for tasks assessed/performed   PT - Cognitive impairments: No apparent impairments                         Following commands: Intact      Cueing Cueing Techniques: Gestural cues, Verbal cues  Exercises      General Comments        Pertinent Vitals/Pain Pain Assessment Pain Assessment: Faces Faces Pain Scale: Hurts even more Pain Location: L arm Pain Descriptors /  Indicators: Grimacing, Sore, Tender Pain Intervention(s): Limited activity within patient's tolerance, Monitored during session, Repositioned    Home Living                          Prior Function            PT Goals (current goals can now be found in the care plan section) Acute Rehab PT Goals Patient Stated Goal: return to [facility] PT Goal Formulation: With patient/family Time For Goal Achievement: 05/17/24 Potential to Achieve Goals: Good Progress towards PT goals: Progressing toward goals    Frequency    Min  2X/week      PT Plan      Co-evaluation              AM-PAC PT 6 Clicks Mobility   Outcome Measure  Help needed turning from your back to your side while in a flat bed without using bedrails?: A Lot Help needed moving from lying on your back to sitting on the side of a flat bed without using bedrails?: A Lot Help needed moving to and from a bed to a chair (including a wheelchair)?: A Lot Help needed standing up from a chair using your arms (e.g., wheelchair or bedside chair)?: A Lot Help needed to walk in hospital room?: Total Help needed climbing 3-5 steps with a railing? : Total 6 Click Score: 10    End of Session Equipment Utilized During Treatment: Gait belt Activity Tolerance: Patient tolerated treatment well Patient left: in chair;with call bell/phone within reach;with chair alarm set Nurse Communication: Mobility status (would use stedy for back to bed) PT Visit Diagnosis: Other abnormalities of gait and mobility (R26.89);Muscle weakness (generalized) (M62.81)     Time: 9075-9048 PT Time Calculation (min) (ACUTE ONLY): 27 min  Charges:    $Therapeutic Activity: 23-37 mins PT General Charges $$ ACUTE PT VISIT: 1 Visit                     Johana RAMAN, PT DPT Acute Rehabilitation Services Secure Chat Preferred  Office [phone]    Dyer Klug E Johna 05/04/2024, 12:10 PM

## 2024-05-04 NOTE — Progress Notes (Addendum)
 Subjective: CC: Patient complains of left upper arm pain. No rib or pelvic pain. Tolerating diet without n/v. BM yesterday. Voiding with good uop at 0.6 ml/kg/hr over the last 12 hours.   Afebrile. No tachycardia or systolic hypotension. Hgb 8.1 from 9.8  Objective: Vital signs in last 24 hours: Temp:  [97.7 F (36.5 C)-98.1 F (36.7 C)] 97.7 F (36.5 C) (09/23 0741) Pulse Rate:  [54-66] 60 (09/23 0741) Resp:  [13-19] 13 (09/23 0741) BP: (107-133)/(42-79) 133/52 (09/23 0741) SpO2:  [91 %-100 %] 98 % (09/23 0741) Last BM Date : 05/03/24  Intake/Output from previous day: 09/22 0701 - 09/23 0700 In: 120 [P.O.:120] Out: 500 [Urine:500] Intake/Output this shift: Total I/O In: -  Out: 450 [Urine:450]  PE: Gen:  Alert, NAD, pleasant Card:  Irregular rhythm with regular rate Pulm:  CTAB, no W/R/R, effort normal Abd: Soft, ND, NT Ext: Left shoulder, upper arm and L elbow ttp. No ttp of the left arm distally. L hip ttp. L BKA. Chronic venous changes of RLE.  Neuro: CN 3-12 grossly intact, MAE's, f/c, non-focal.   Lab Results:  Recent Labs    05/03/24 1407 05/04/24 0439  WBC 10.5 7.7  HGB 9.8* 8.1*  HCT 30.3* 25.8*  PLT 127* 137*   BMET Recent Labs    05/03/24 0546 05/04/24 0439  NA 132* 130*  K 4.1 4.9  CL 100 97*  CO2 21* 24  GLUCOSE 132* 115*  BUN 20 27*  CREATININE 1.40* 1.60*  CALCIUM  9.0 8.7*   PT/INR Recent Labs    05/02/24 1738  LABPROT 17.0*  INR 1.3*   CMP     Component Value Date/Time   NA 130 (L) 05/04/2024 0439   NA 137 06/23/2014 0000   K 4.9 05/04/2024 0439   CL 97 (L) 05/04/2024 0439   CO2 24 05/04/2024 0439   GLUCOSE 115 (H) 05/04/2024 0439   BUN 27 (H) 05/04/2024 0439   BUN 24 (A) 06/23/2014 0000   CREATININE 1.60 (H) 05/04/2024 0439   CALCIUM  8.7 (L) 05/04/2024 0439   PROT 6.6 05/02/2024 1738   ALBUMIN 3.4 (L) 05/02/2024 1738   AST 29 05/02/2024 1738   ALT 15 05/02/2024 1738   ALKPHOS 82 05/02/2024 1738   BILITOT  0.7 05/02/2024 1738   GFRNONAA 29 (L) 05/04/2024 0439   GFRAA 38 (L) 04/14/2018 1230   Lipase     Component Value Date/Time   LIPASE 44 10/21/2023 1603    Studies/Results: DG Elbow 2 Views Left Result Date: 05/03/2024 EXAM: 1 or 2 VIEW(S) XRAY OF THE LEFT ELBOW COMPARISON: None available. CLINICAL HISTORY: Left elbow/ arm pain. FINDINGS: BONES AND JOINTS: No acute fracture. No focal osseous lesion. No joint dislocation. No joint effusion. SOFT TISSUES: The soft tissues are unremarkable. IMPRESSION: 1. No acute abnormality. Electronically signed by: Waddell Calk MD 05/03/2024 08:59 AM EDT RP Workstation: [id number]   CT CHEST ABDOMEN PELVIS W CONTRAST Result Date: 05/02/2024 CLINICAL DATA:  Trauma.  Possible left-sided rib fractures. EXAM: CT CHEST, ABDOMEN, AND PELVIS WITH CONTRAST TECHNIQUE: Multidetector CT imaging of the chest, abdomen and pelvis was performed following the standard protocol during bolus administration of intravenous contrast. RADIATION DOSE REDUCTION: This exam was performed according to the departmental dose-optimization program which includes automated exposure control, adjustment of the mA and/or kV according to patient size and/or use of iterative reconstruction technique. CONTRAST:  75mL OMNIPAQUE  IOHEXOL  350 MG/ML SOLN COMPARISON:  None Available. FINDINGS: CT CHEST FINDINGS Cardiovascular:  There is moderate severe cardiomegaly. There is no pericardial effusion. The aorta is normal in size. There are atherosclerotic calcifications of the. Mediastinum/Nodes: There is an enlarged subcarinal lymph node measuring 13 mm. Visualized thyroid  gland and esophagus are within normal limits. There is a small hiatal hernia. Lungs/Pleura: There scattered mild peripheral reticular opacities throughout both lungs. There some mild ground-glass opacities in the inferior left upper lobe. There some chronic bronchiectasis in the right lower lobe. There is no focal lung consolidation, pleural  effusion or pneumothorax. There scattered 1-2 mm nodular densities throughout both lungs. Musculoskeletal: There is an acute lateral left seventh rib fracture. There are healed bilateral rib fractures. The bones are diffusely osteopenic. Degenerative changes affect the spine. CT ABDOMEN PELVIS FINDINGS Hepatobiliary: No hepatic injury or perihepatic hematoma. Gallbladder is unremarkable. There is a subcentimeter hypodensity in the right lobe of the liver which is too small to characterize, likely cysts. Pancreas: Unremarkable. No pancreatic ductal dilatation or surrounding inflammatory changes. Spleen: No splenic injury or perisplenic hematoma. Adrenals/Urinary Tract: There is no adrenal hemorrhage or renal injury identified. There is no hydronephrosis. There scattered bilateral renal cysts measuring up to 16 mm. Mildly hyperdense rounded lesion in the left kidney measures 16 mm in diameter on image 6/16. There is bladder wall trabeculation diffusely. Multiple bladder diverticula are seen. Stomach/Bowel: Stomach is within normal limits. Appendix appears normal. No evidence of bowel wall thickening, distention, or inflammatory changes. There is sigmoid colon diverticulosis. Vascular/Lymphatic: There are extensive vascular calcifications of the aorta and iliac arteries. Aorta is normal in size. IVC is normal in size. No enlarged lymph nodes are identified. Reproductive: Uterus is surgically absent. Adnexa are within normal limits. Other: There is extraperitoneal anterior left pelvic hemorrhage adjacent to fractures measuring up to 18 mm in thickness. There is also asymmetric enlargement of the left iliacus muscle likely related to intramuscular hemorrhage from fracture. There is no ascites or focal abdominal wall hernia. Musculoskeletal: There are acute left superior and inferior pubic rami fractures which are nondisplaced. Pubic symphysis appears intact. There are questionable healed left sacral ala fractures.  Sacroiliac joints appear intact. IMPRESSION: 1. Acute left superior and inferior pubic rami fractures. 2. Extraperitoneal hemorrhage in the anterior left pelvis adjacent to fractures. 3. Asymmetric enlargement of the left iliacus muscle likely related to intramuscular hemorrhage from fracture. 4. Acute lateral left seventh rib fracture. 5. No pneumothorax or pleural effusion. 6. No acute posttraumatic sequelae in the abdomen or pelvis. 7. Cardiomegaly. 8. Mild mediastinal lymphadenopathy, indeterminate. 9. Mild ground-glass opacities in the inferior left upper lobe may be infectious/inflammatory. 10. 16 mm mildly hyperdense lesion in the left kidney. Recommend further evaluation with ultrasound. 11. Bladder wall trabeculation with multiple bladder diverticula. 12. Colonic diverticulosis. 13. Aortic atherosclerosis. Aortic Atherosclerosis (ICD10-I70.0). Electronically Signed   By: Greig Pique M.D.   On: 05/02/2024 20:28   CT Head Wo Contrast Result Date: 05/02/2024 CLINICAL DATA:  Polytrauma, blunt.  Fall. EXAM: CT HEAD WITHOUT CONTRAST TECHNIQUE: Contiguous axial images were obtained from the base of the skull through the vertex without intravenous contrast. RADIATION DOSE REDUCTION: This exam was performed according to the departmental dose-optimization program which includes automated exposure control, adjustment of the mA and/or kV according to patient size and/or use of iterative reconstruction technique. COMPARISON:  03/20/2022 FINDINGS: Brain: There is atrophy and chronic small vessel disease changes. No acute intracranial abnormality. Specifically, no hemorrhage, hydrocephalus, mass lesion, acute infarction, or significant intracranial injury. Vascular: No hyperdense vessel or unexpected calcification. Skull:  No acute calvarial abnormality. Sinuses/Orbits: No acute findings Other: Soft tissue swelling in the left temporal region. IMPRESSION: Atrophy, chronic microvascular disease. No acute intracranial  abnormality. Electronically Signed   By: Franky Crease M.D.   On: 05/02/2024 18:21   CT Cervical Spine Wo Contrast Result Date: 05/02/2024 CLINICAL DATA:  Polytrauma, blunt.  Fall. EXAM: CT CERVICAL SPINE WITHOUT CONTRAST TECHNIQUE: Multidetector CT imaging of the cervical spine was performed without intravenous contrast. Multiplanar CT image reconstructions were also generated. RADIATION DOSE REDUCTION: This exam was performed according to the departmental dose-optimization program which includes automated exposure control, adjustment of the mA and/or kV according to patient size and/or use of iterative reconstruction technique. COMPARISON:  03/20/2022 FINDINGS: Alignment: No subluxation. Skull base and vertebrae: No acute fracture. No primary bone lesion or focal pathologic process. Soft tissues and spinal canal: No prevertebral fluid or swelling. No visible canal hematoma. Disc levels: Moderate to advanced multilevel degenerative disc and facet disease. Upper chest: No acute findings Other: None IMPRESSION: Multilevel degenerative changes.  No acute bony abnormality. Electronically Signed   By: Franky Crease M.D.   On: 05/02/2024 18:19   DG Chest Port 1 View Result Date: 05/02/2024 CLINICAL DATA:  Trauma, fall. EXAM: PORTABLE CHEST 1 VIEW COMPARISON:  11/19/2023. FINDINGS: The heart is enlarged and mitral valve calcifications are noted. There is atherosclerotic calcification of the aorta. The visualized mediastinal contour is within normal limits. No consolidation, effusion, or pneumothorax is seen. Old rib fractures are noted on the right. There is a fracture of the T6 rib on the left, indeterminate in age. IMPRESSION: 1. No active disease. 2. Fracture of the T6 rib on the left, indeterminate in age. Electronically Signed   By: Leita Birmingham M.D.   On: 05/02/2024 17:59   DG Pelvis 1-2 Views Result Date: 05/02/2024 CLINICAL DATA:  Fall EXAM: PELVIS - 1-2 VIEW COMPARISON:  None Available. FINDINGS: The  bones are diffusely osteopenic. Examination is limited secondary to patient rotation. Can not exclude nondisplaced fractures of the left superior and inferior pubic rami. No other fractures are visualized. There is no dislocation. Mild degenerative changes affect hips. There is a extensive atherosclerotic calcification throughout the aorta and iliac arteries. IMPRESSION: 1. Can not exclude nondisplaced fractures of the left superior and inferior pubic rami. Recommend further evaluation with CT. 2. Osteopenia. Electronically Signed   By: Greig Pique M.D.   On: 05/02/2024 17:58   DG Shoulder Left Portable Result Date: 05/02/2024 CLINICAL DATA:  Fall EXAM: LEFT SHOULDER COMPARISON:  None Available. FINDINGS: The bones are diffusely osteopenic. There is no evidence of fracture or dislocation. There is no evidence of arthropathy or other focal bone abnormality. Soft tissues are unremarkable. IMPRESSION: Negative. Electronically Signed   By: Greig Pique M.D.   On: 05/02/2024 17:56    Anti-infectives: Anti-infectives (From admission, onward)    None        Assessment/Plan S/p mechanical fall on Eliquis  9/21 Left superior and inferior pubic rami fractures with associated extraperitoneal hemorrhage and intramuscular hemorrhage of left iliacus - Per Ortho, Dr. Ozell Ada, non-op, WBAT. Trend hgb. Hold Eliquis .  Left lateral 7th rib fracture - multimodal pain control, pulm toilet.  ABL anemia - Hgb 11.2 --> 9.1 --> 9.8 --> 8.1. Repeat hgb in AM.  Afib on Eliquis  - Discussed with cardiology. Hold here and at d/c. Plan f/u with Cardiology to discuss further.  Hx HTN - 1/2 home dose Metoprolol  (12.5 BID from 25 BID), can increase to home  dose in AM if BP stable overnight. Hold on increasing today.  Hx HLD - home meds  Hx CAD Hx CHF Hx CVA L BKA  L arm pain - xrays Incidental Findings - Mild mediastinal lymphadenopathy, Mild ground glass opacities in the inferior left upper lobe, 16 mm mildly  hyperdense lesion in the left kidney, Bladder wall trabeculation with multiple bladder diverticula, Colonic diverticulosis, Cardiomegaly FEN - Reg, add IVF at 50ml/hr with Cr increase from 1.4 to 1.6, bowel regimen VTE - SCDs, hold Eliquis . Has already been started on delyaed start lovenox .  ID - None Foley - None, spont void.  Plan - Monitor hgb. PT/OT - SNF. Has authorization for [facility] SN[facility].   I reviewed nursing notes, Consultant (Ortho) notes, last 24 h vitals and pain scores, last 48 h intake and output, last 24 h labs and trends, and last 24 h imaging results.     LOS: 2 days    Ozell CHRISTELLA Shaper, [facility] 05/04/2024, 10:12 AM Please see Amion for pager number during day hours 7:00am-4:30pm

## 2024-05-05 ENCOUNTER — Encounter (HOSPITAL_COMMUNITY): Payer: Self-pay

## 2024-05-05 LAB — MAGNESIUM: Magnesium: 1.9 mg/dL (ref 1.7–2.4)

## 2024-05-05 LAB — CBC
HCT: 24.6 % — ABNORMAL LOW (ref 36.0–46.0)
Hemoglobin: 8.1 g/dL — ABNORMAL LOW (ref 12.0–15.0)
MCH: 30 pg (ref 26.0–34.0)
MCHC: 32.9 g/dL (ref 30.0–36.0)
MCV: 91.1 fL (ref 80.0–100.0)
Platelets: 107 K/uL — ABNORMAL LOW (ref 150–400)
RBC: 2.7 MIL/uL — ABNORMAL LOW (ref 3.87–5.11)
RDW: 14.6 % (ref 11.5–15.5)
WBC: 6.5 K/uL (ref 4.0–10.5)
nRBC: 0 % (ref 0.0–0.2)

## 2024-05-05 LAB — BASIC METABOLIC PANEL WITH GFR
Anion gap: 8 (ref 5–15)
BUN: 31 mg/dL — ABNORMAL HIGH (ref 8–23)
CO2: 21 mmol/L — ABNORMAL LOW (ref 22–32)
Calcium: 8.4 mg/dL — ABNORMAL LOW (ref 8.9–10.3)
Chloride: 101 mmol/L (ref 98–111)
Creatinine, Ser: 1.5 mg/dL — ABNORMAL HIGH (ref 0.44–1.00)
GFR, Estimated: 32 mL/min — ABNORMAL LOW (ref >60)
Glucose, Bld: 98 mg/dL (ref 70–99)
Potassium: 3.7 mmol/L (ref 3.5–5.1)
Sodium: 130 mmol/L — ABNORMAL LOW (ref 135–145)

## 2024-05-05 LAB — PHOSPHORUS: Phosphorus: 3.9 mg/dL (ref 2.5–4.6)

## 2024-05-05 NOTE — TOC Progression Note (Signed)
 Transition of Care [facility]) - Progression Note    Patient Details  Name: Marvelyn Bouchillon MRN: [id number] Date of Birth: Dec 30, 1925  Transition of Care [facility]) CM/SW Contact  Yomaira Solar M, RN Phone Number: 05/05/2024, 1:36pm  Clinical Narrative:    Insurance authorization approved through 05/06/2024; Kristin at [facility] confirms that bed will be available for patient tomorrow at facility.  Provider will plan to dc tomorrow to SNF.       Expected Discharge Plan: Skilled Nursing Facility Barriers to Discharge: Continued Medical Work up                   Living arrangements for the past 2 months: Assisted Living Facility                                       Social Drivers of Health (SDOH) Interventions SDOH Screenings   Food Insecurity: No Food Insecurity (05/03/2024)  Housing: Low Risk  (05/03/2024)  Transportation Needs: Unmet Transportation Needs (05/03/2024)  Utilities: Not At Risk (05/03/2024)  Social Connections: Moderately Integrated (05/03/2024)  Tobacco Use: Low Risk  (05/05/2024)    Readmission Risk Interventions    05/03/2024    9:44 AM  Readmission Risk Prevention Plan  Transportation Screening Complete  PCP or Specialist Appt within 3-5 Days Complete  HRI or Home Care Consult Complete  Social Work Consult for Recovery Care Planning/Counseling Complete  Palliative Care Screening Not Applicable  Medication Review (RN Care Manager) Complete   Mliss MICAEL Fass, RN, BSN  Trauma/Neuro ICU Case Manager [phone]

## 2024-05-05 NOTE — Progress Notes (Signed)
 Occupational Therapy Treatment Patient Details Name: Mckynna Vanloan MRN: [id number] DOB: [birth year] Today's Date: 05/05/2024   History of present illness 89 yo female s/p fall out of wc at [facility]. Pt sustained L superior and inferior pubic rami fx with extraperitoneal hemorrhage, IM hemorrhage L iliacus, L lateral 7th rib fx. Nonop, WBAT LLE. LUE imaging + for undisplaced fx of the greater tuberosity 9/24. PMH includes L BKA 2019, afib on Eliquis , HTN, CHF, CAD, CKD, TIA/CVA, HLD, MI, depression, R ankle ORIF 2015.   OT comments  Pt aware she has a non displaced fx in L shoulder. Educated in sling use and wearing schedule, compensatory strategies for bathing and dressing (verbally) and NWB status of L UE during bed mobility and pt will be unable to use her RW until precautions are lifted. Updated OT d/c recommendation to SNF.       If plan is discharge home, recommend the following:  Two people to help with walking and/or transfers;A lot of help with bathing/dressing/bathroom;Assistance with cooking/housework;Assistance with feeding;Direct supervision/assist for medications management;Direct supervision/assist for financial management;Assist for transportation;Help with stairs or ramp for entrance   Equipment Recommendations  None recommended by OT    Recommendations for Other Services      Precautions / Restrictions Precautions Precautions: Fall Recall of Precautions/Restrictions: Impaired Precaution/Restrictions Comments: educated in NWB on L UE and sling use Required Braces or Orthoses: Sling Restrictions Weight Bearing Restrictions Per Provider Order: Yes RUE Weight Bearing Per Provider Order: Non weight bearing Other Position/Activity Restrictions: L LE prosthesis       Mobility Bed Mobility Overal bed mobility: Needs Assistance Bed Mobility: Rolling Rolling: Mod assist         General bed mobility comments: assisted to roll for bed pan    Transfers                          Balance                                           ADL either performed or assessed with clinical judgement   ADL Overall ADL's : Needs assistance/impaired Eating/Feeding: Set up;Sitting                                          Extremity/Trunk Assessment Upper Extremity Assessment LUE Deficits / Details: applied L shoulder sling/immobilizer and educated pt in proper positioning and thumb loop            Vision       Perception     Praxis     Communication Communication Communication: Impaired Factors Affecting Communication: Hearing impaired   Cognition Arousal: Alert Behavior During Therapy: WFL for tasks assessed/performed Cognition: No family/caregiver present to determine baseline             OT - Cognition Comments: some memory deficits, needing cues to recall precautions                 Following commands: Intact        Cueing   Cueing Techniques: Verbal cues  Exercises      Shoulder Instructions       General Comments      Pertinent Vitals/ Pain       Pain Assessment  Pain Assessment: Faces Faces Pain Scale: Hurts even more Pain Location: L shoulder Pain Descriptors / Indicators: Grimacing, Sore, Tender Pain Intervention(s): Monitored during session, Repositioned  Home Living                                          Prior Functioning/Environment              Frequency  Min 2X/week        Progress Toward Goals  OT Goals(current goals can now be found in the care plan section)  Progress towards OT goals: Progressing toward goals  Acute Rehab OT Goals OT Goal Formulation: With patient Time For Goal Achievement: 05/17/24 Potential to Achieve Goals: Good  Plan      Co-evaluation                 AM-PAC OT 6 Clicks Daily Activity     Outcome Measure   Help from another person eating meals?: A Little Help from another person taking care  of personal grooming?: A Little Help from another person toileting, which includes using toliet, bedpan, or urinal?: Total Help from another person bathing (including washing, rinsing, drying)?: A Lot Help from another person to put on and taking off regular upper body clothing?: A Lot Help from another person to put on and taking off regular lower body clothing?: Total 6 Click Score: 12    End of Session    OT Visit Diagnosis: Unsteadiness on feet (R26.81);Pain   Activity Tolerance Patient tolerated treatment well   Patient Left in bed;with call bell/phone within reach;Other (comment) (with PT)   Nurse Communication          Time: 8984-8961 OT Time Calculation (min): 23 min  Charges: OT General Charges $OT Visit: 1 Visit OT Treatments $Self Care/Home Management : 8-22 mins $Therapeutic Activity: 8-22 mins  Mliss HERO, OTR/L Acute Rehabilitation Services Office: [phone]   Kennth Mliss Helling 05/05/2024, 10:54 AM

## 2024-05-05 NOTE — Care Management Important Message (Signed)
 Important Message  Patient Details  Name: Ana Franco MRN: [id number] Date of Birth: [birth year]   Important Message Given:  Yes - Medicare IM     Jon Cruel 05/05/2024, 3:39 PM

## 2024-05-05 NOTE — Progress Notes (Signed)
 Orthopedic Tech Progress Note Patient Details:  Ana Franco 06/12/1926 [id number]  Ortho Devices Type of Ortho Device: Sling immobilizer Ortho Device/Splint Location: LUE Ortho Device/Splint Interventions: Ordered, Application, Adjustment   Post Interventions Patient Tolerated: Well Instructions Provided: Care of device  [name] Pac 05/05/2024, 10:34 AM

## 2024-05-05 NOTE — Plan of Care (Signed)

## 2024-05-05 NOTE — Progress Notes (Addendum)
 Physical Therapy Treatment Patient Details Name: Ana Franco MRN: [id number] DOB: Nov 01, 1925 Today's Date: 05/05/2024   History of Present Illness 89 yo female s/p fall out of wc at [facility] Pt sustained L superior and inferior pubic rami fx with extraperitoneal hemorrhage, IM hemorrhage L iliacus, L lateral 7th rib fx. Nonop, WBAT LLE. LUE xray negative for fx (initially); on repeat proximal humerus fx. PMH includes L BKA 2019, afib on Eliquis , HTN, CHF, CAD, CKD, TIA/CVA, HLD, MI, depression, R ankle ORIF 2015.    PT Comments  Notified by Ozell Shaper, PA of pt's newly found Lt humeral fx and now NWB in sling. He requested additional PT session prior to discharge to assure pt understands her restrictions prior to leaving. Pt on bedpan on arrival, ready to come off. Educated on LUE precautions and how her transfer technique will have to change (likely to use sliding board). Assisted pt to roll rt with max assist. Performed pericare for pt and replaced soiled bed pad. Rolled to supine and to her left with max assist and finished placing clean linens and 2nd/waist strap around pt to secure to sling and promote stabilization of LUE. Noted pt +2 max assist for come to EOB and therefore deferred as only +1 available. Pt able to state her precautions for LUE at end of session.  Post-acute inpatient therapies <3 hrs/day remain appropriate upon discharge.    If plan is discharge home, recommend the following: A lot of help with bathing/dressing/bathroom;Two people to help with walking and/or transfers   Can travel by private vehicle        Equipment Recommendations  None recommended by PT    Recommendations for Other Services       Precautions / Restrictions Precautions Precautions: Fall Recall of Precautions/Restrictions: Intact Precaution/Restrictions Comments: L prosthesis Required Braces or Orthoses: Sling Restrictions Weight Bearing Restrictions Per Provider Order: Yes RUE Weight Bearing  Per Provider Order: Non weight bearing Other Position/Activity Restrictions: WBAT     Mobility  Bed Mobility Overal bed mobility: Needs Assistance Bed Mobility: Rolling Rolling: Max assist         General bed mobility comments: Rolling to come off bedpan and then to both sides to replace dirty bed pad; max assist to either side    Transfers                   General transfer comment: unable with +1 assist    Ambulation/Gait               General Gait Details: nt   Stairs             Wheelchair Mobility     Tilt Bed    Modified Rankin (Stroke Patients Only)       Balance                                            Communication Communication Communication: No apparent difficulties Factors Affecting Communication: Hearing impaired  Cognition Arousal: Alert Behavior During Therapy: WFL for tasks assessed/performed                             Following commands: Intact      Cueing Cueing Techniques: Gestural cues, Verbal cues, Tactile cues  Exercises      General Comments  Pertinent Vitals/Pain Pain Assessment Pain Assessment: Faces Faces Pain Scale: Hurts even more Pain Location: L shoulder Pain Descriptors / Indicators: Grimacing, Sore, Tender Pain Intervention(s): Limited activity within patient's tolerance, Monitored during session, Repositioned    Home Living                          Prior Function            PT Goals (current goals can now be found in the care plan section) Acute Rehab PT Goals Patient Stated Goal: return to [facility] SN[facility] Time For Goal Achievement: 05/17/24 Potential to Achieve Goals: Good Progress towards PT goals: Goals downgraded-see care plan    Frequency    Min 2X/week      PT Plan      Co-evaluation              AM-PAC PT 6 Clicks Mobility   Outcome Measure  Help needed turning from your back to your side while in a  flat bed without using bedrails?: A Lot Help needed moving from lying on your back to sitting on the side of a flat bed without using bedrails?: Total Help needed moving to and from a bed to a chair (including a wheelchair)?: Total Help needed standing up from a chair using your arms (e.g., wheelchair or bedside chair)?: Total Help needed to walk in hospital room?: Total Help needed climbing 3-5 steps with a railing? : Total 6 Click Score: 7    End of Session Equipment Utilized During Treatment: Other (comment) (sling) Activity Tolerance: Patient tolerated treatment well Patient left: with call bell/phone within reach;in bed Nurse Communication: Mobility status;Need for lift equipment PT Visit Diagnosis: Other abnormalities of gait and mobility (R26.89);Muscle weakness (generalized) (M62.81)     Time: 8969-8951 PT Time Calculation (min) (ACUTE ONLY): 18 min  Charges:    $Therapeutic Activity: 8-22 mins PT General Charges $$ ACUTE PT VISIT: 1 Visit                      Macario RAMAN, PT Acute Rehabilitation Services  Office [phone]    Macario SHAUNNA Soja 05/05/2024, 10:54 AM

## 2024-05-05 NOTE — Progress Notes (Addendum)
 Subjective: CC: Patient complains of left upper arm pain. No rib or pelvic pain except for when she moves. Tolerating diet without n/v. BM yesterday. Voiding with good uop.   Afebrile. No tachycardia or systolic hypotension. Hgb stable. Cr improving.   Objective: Vital signs in last 24 hours: Temp:  [97.6 F (36.4 C)-98.6 F (37 C)] 98.6 F (37 C) (09/24 0800) Pulse Rate:  [49-72] 54 (09/24 0800) Resp:  [13-19] 13 (09/24 0800) BP: (118-133)/(41-55) 133/55 (09/24 0800) SpO2:  [91 %-94 %] 94 % (09/24 0800) Last BM Date : 05/04/24  Intake/Output from previous day: 09/23 0701 - 09/24 0700 In: 583.2 [P.O.:360; I.V.:223.2] Out: 1100 [Urine:1100] Intake/Output this shift: No intake/output data recorded.  PE: Gen:  Alert, NAD, pleasant Card:  Irr, irr Pulm:  CTAB, no W/R/R, effort normal Abd: Soft, ND, NT Ext: LUE with SILT, no wrist drop, radial 2+. L BKA. Chronic venous changes of RLE.   Lab Results:  Recent Labs    05/04/24 0439 05/05/24 0505  WBC 7.7 6.5  HGB 8.1* 8.1*  HCT 25.8* 24.6*  PLT 137* 107*   BMET Recent Labs    05/04/24 0439 05/05/24 0505  NA 130* 130*  K 4.9 3.7  CL 97* 101  CO2 24 21*  GLUCOSE 115* 98  BUN 27* 31*  CREATININE 1.60* 1.50*  CALCIUM  8.7* 8.4*   PT/INR Recent Labs    05/02/24 1738  LABPROT 17.0*  INR 1.3*   CMP     Component Value Date/Time   NA 130 (L) 05/05/2024 0505   NA 137 06/23/2014 0000   K 3.7 05/05/2024 0505   CL 101 05/05/2024 0505   CO2 21 (L) 05/05/2024 0505   GLUCOSE 98 05/05/2024 0505   BUN 31 (H) 05/05/2024 0505   BUN 24 (A) 06/23/2014 0000   CREATININE 1.50 (H) 05/05/2024 0505   CALCIUM  8.4 (L) 05/05/2024 0505   PROT 6.6 05/02/2024 1738   ALBUMIN 3.4 (L) 05/02/2024 1738   AST 29 05/02/2024 1738   ALT 15 05/02/2024 1738   ALKPHOS 82 05/02/2024 1738   BILITOT 0.7 05/02/2024 1738   GFRNONAA 32 (L) 05/05/2024 0505   GFRAA 38 (L) 04/14/2018 1230   Lipase     Component Value Date/Time    LIPASE 44 10/21/2023 1603    Studies/Results: DG Shoulder Left Port Result Date: 05/04/2024 CLINICAL DATA:  855384 Pain 144615. EXAM: LEFT SHOULDER; LEFT HUMERUS - 2+ VIEW COMPARISON:  05/02/2024. FINDINGS: There is probable acute, undisplaced fracture of the greater tuberosity of the left proximal humerus. No other acute fracture or dislocation. No aggressive osseous lesion. Glenohumeral and acromioclavicular joints are normal in alignment and exhibit mild degenerative changes. Probable mild degenerative changes of elbow joints, not well evaluated. No soft tissue swelling. No radiopaque foreign bodies. IMPRESSION: Probable acute, undisplaced fracture of the greater tuberosity of the left proximal humerus. Electronically Signed   By: Ree Molt M.D.   On: 05/04/2024 15:58   DG Humerus Left Result Date: 05/04/2024 CLINICAL DATA:  855384 Pain 144615. EXAM: LEFT SHOULDER; LEFT HUMERUS - 2+ VIEW COMPARISON:  05/02/2024. FINDINGS: There is probable acute, undisplaced fracture of the greater tuberosity of the left proximal humerus. No other acute fracture or dislocation. No aggressive osseous lesion. Glenohumeral and acromioclavicular joints are normal in alignment and exhibit mild degenerative changes. Probable mild degenerative changes of elbow joints, not well evaluated. No soft tissue swelling. No radiopaque foreign bodies. IMPRESSION: Probable acute, undisplaced fracture of the  greater tuberosity of the left proximal humerus. Electronically Signed   By: Ree Molt M.D.   On: 05/04/2024 15:58    Anti-infectives: Anti-infectives (From admission, onward)    None        Assessment/Plan S/p mechanical fall on Eliquis  9/21 Left superior and inferior pubic rami fractures with associated extraperitoneal hemorrhage and intramuscular hemorrhage of left iliacus - Per Ortho, Dr. Ozell Ada, non-op, WBAT. Trend hgb. Hold Eliquis .  Left lateral 7th rib fracture - multimodal pain control, pulm  toilet.  ABL anemia - Hgb stable at 8.1 on last check.  Afib on Eliquis  - Discussed with cardiology. Hold here and at d/c. Plan f/u with Cardiology to discuss further.  Hx HTN - 1/2 home dose Metoprolol  (12.5 BID from 25 BID), can increase to home dose in AM if BP stable overnight. Hold on increasing today.  L greater tuberosity of the left proximal humerus fx - Ortho consulted. Recommended sling and NWB. F/u in 2 weeks with Dr. Kendal.  Hyponatremia - stable at 130. Monitor. On NS Hx HLD - home meds  Hx CAD Hx CHF Hx CVA L BKA  Incidental Findings - Mild mediastinal lymphadenopathy, Mild ground glass opacities in the inferior left upper lobe, 16 mm mildly hyperdense lesion in the left kidney, Bladder wall trabeculation with multiple bladder diverticula, Colonic diverticulosis, Cardiomegaly FEN - Reg,  IVF at 83ml/hr, bowel regimen VTE - SCDs, hold Eliquis . Lovenox .  ID - None Foley - None, spont void.  Plan - Has authorization for [facility] SNF. Plan d/c after repeat PT/OT evaluations with new fx and LUE restrictions. I have sent a message to PT/OT. She did want to talk to [facility] about the SNF. I sent a message to TOC.   I reviewed nursing notes, Consultant (Ortho) notes, last 24 h vitals and pain scores, last 48 h intake and output, last 24 h labs and trends, and last 24 h imaging results.     LOS: 3 days    Ozell CHRISTELLA Shaper, [facility] 05/05/2024, 9:41 AM Please see Amion for pager number during day hours 7:00am-4:30pm

## 2024-05-06 LAB — BASIC METABOLIC PANEL WITH GFR
Anion gap: 7 (ref 5–15)
BUN: 29 mg/dL — ABNORMAL HIGH (ref 8–23)
CO2: 23 mmol/L (ref 22–32)
Calcium: 8.3 mg/dL — ABNORMAL LOW (ref 8.9–10.3)
Chloride: 102 mmol/L (ref 98–111)
Creatinine, Ser: 1.39 mg/dL — ABNORMAL HIGH (ref 0.44–1.00)
GFR, Estimated: 35 mL/min — ABNORMAL LOW (ref >60)
Glucose, Bld: 94 mg/dL (ref 70–99)
Potassium: 3.8 mmol/L (ref 3.5–5.1)
Sodium: 132 mmol/L — ABNORMAL LOW (ref 135–145)

## 2024-05-06 LAB — CBC
HCT: 26.1 % — ABNORMAL LOW (ref 36.0–46.0)
Hemoglobin: 8.4 g/dL — ABNORMAL LOW (ref 12.0–15.0)
MCH: 29.6 pg (ref 26.0–34.0)
MCHC: 32.2 g/dL (ref 30.0–36.0)
MCV: 91.9 fL (ref 80.0–100.0)
Platelets: 126 K/uL — ABNORMAL LOW (ref 150–400)
RBC: 2.84 MIL/uL — ABNORMAL LOW (ref 3.87–5.11)
RDW: 14.5 % (ref 11.5–15.5)
WBC: 6.2 K/uL (ref 4.0–10.5)
nRBC: 0 % (ref 0.0–0.2)

## 2024-05-06 LAB — MAGNESIUM: Magnesium: 1.8 mg/dL (ref 1.7–2.4)

## 2024-05-06 LAB — PHOSPHORUS: Phosphorus: 3.6 mg/dL (ref 2.5–4.6)

## 2024-05-06 MED ORDER — OXYCODONE HCL 5 MG PO TABS: 2.5000 mg | ORAL_TABLET | Freq: Four times a day (QID) | ORAL | 0 refills | Status: DC | PRN

## 2024-05-06 MED ORDER — POLYETHYLENE GLYCOL 3350 17 G PO PACK: 17.0000 g | PACK | Freq: Every day | ORAL | Status: AC | PRN

## 2024-05-06 MED ORDER — LIDOCAINE 5 % EX PTCH: 1.0000 | MEDICATED_PATCH | CUTANEOUS | Status: AC

## 2024-05-06 MED ORDER — METOPROLOL TARTRATE 25 MG PO TABS: 12.5000 mg | ORAL_TABLET | Freq: Two times a day (BID) | ORAL | Status: AC

## 2024-05-06 MED ORDER — ACETAMINOPHEN 500 MG PO TABS: 1000.0000 mg | ORAL_TABLET | Freq: Three times a day (TID) | ORAL | Status: AC | PRN

## 2024-05-06 NOTE — Discharge Summary (Signed)
 Patient ID: Ana Franco [id number] 1926-02-07 89 y.o.  Admit date: 05/02/2024 Discharge date: 05/06/2024  Admitting Diagnosis: Ana Franco is an 89 y.o. female with history of Afib on Eliquis  s/p mechanical fall earlier today. - Left superior and inferior pubic rami fractures with associated extraperitoneal hemorrhage - Intramuscular hemorrhage of left iliacus - Left lateral 7th rib fracture - Mild mediastinal lymphadenopathy - Mild ground glass opacities in the inferior left upper lobe - 16 mm mildly hyperdense lesion in the left kidney - Bladder wall trabeculation with multiple bladder diverticula - Colonic diverticulosis - Cardiomegaly  Discharge Diagnosis S/p mechanical fall on Eliquis  9/21 Left superior and inferior pubic rami fractures with associated extraperitoneal hemorrhage and intramuscular hemorrhage of left iliacus  Left lateral 7th rib fracture ABL anemia  Afib on Eliquis  Hx HTN L greater tuberosity of the left proximal humerus fx Hyponatremia Hx HLD Hx CAD Hx CHF Hx CVA L BKA  Incidental Findings - Mild mediastinal lymphadenopathy, Mild ground glass opacities in the inferior left upper lobe, 16 mm mildly hyperdense lesion in the left kidney, Bladder wall trabeculation with multiple bladder diverticula, Colonic diverticulosis, Cardiomegaly   Consultants Orthopedics Cardiology  HPI: Ana Franco is an 89 y.o. female with history of hypertension, hyperlipidemia, GERD, atrial fibrillation on Eliquis , left leg amputation, and CAD with previous MI remotely who presents to [facility] after a mechanical fall earlier today from her wheelchair on a curb.   Per patient she was at her nursing facility outside in a wheelchair when she was trying to change locations and did not realize there was a curb when mobilizing and she tumbled out of her wheelchair.  She hit her head but she denies loss of consciousness.  She complains of pain to her head, neck, left shoulder,  left chest, and left hip.  She states that her pain is overall well-controlled, and after she received a dose of gabapentin  she says pain is very much controlled.   Patient is satting well on 2 L nasal cannula, but she does not have oxygen requirements at home.  She was able to pull 1750 on incentive spirometry that was evaluated personally at bedside.   10 point review of systems is negative except as listed above in HPI.   Procedures None  Hospital Course:  Patient presented after a mechanical fall on Eliquis  9/21 and was found to have below injuries.   Left superior and inferior pubic rami fractures with associated extraperitoneal hemorrhage and intramuscular hemorrhage of left iliacus - Per Ortho, Dr. Ozell Ada, non-op, WBAT. Hgb monitored and stabilized prior to discharge. Hold Eliquis .   Left lateral 7th rib fracture - Treated with multimodal pain control, pulm toilet.   ABL anemia - Serial hgb's monitored and stabilized prior to discharge. Hgb 8.4 on day of discharge.   Afib on Eliquis  - Discussed with cardiology. They recommended to hold Eliquis  here and at d/c. They will arrange f/u  to discuss if she should restart as an outpatient.   Hx HTN - Home meds except restarted 1/2 home dose Metoprolol  (12.5 BID from 25 BID). Continue this dose at discharge as HR have been in the 60's. Follow up with Cardiology to determine when she should resume normal home dosing.   L greater tuberosity of the left proximal humerus fx - Ortho consulted. Recommended sling and NWB. F/u in 2 weeks with Dr. Kendal.   Hyponatremia - Treated with IVF. Improving at 132 on day of discharge.   Hx HLD  Hx  CAD Hx CHF Hx CVA L BKA  Incidental Findings - Mild mediastinal lymphadenopathy, Mild ground glass opacities in the inferior left upper lobe, 16 mm mildly hyperdense lesion in the left kidney, Bladder wall trabeculation with multiple bladder diverticula, Colonic diverticulosis, Cardiomegaly. Recommended  PCP follow up.   Patient worked with therapies and was recommended for SNF. Patient accepted at [facility]. Patient agreeable to this. On 9/25, the patient was voiding well, tolerating diet, working with therapies, pain well controlled, vital signs stable and felt stable for discharge home. Follow up as noted below.   Physical Exam: Gen:  Alert, NAD, pleasant Card:  Irregular rhythm with regular rate Pulm:  CTAB, no W/R/R, effort normal Abd: Soft, ND, NT Ext: LUE in sliing - SILT to hand, no wrist drop, radial 2+. L BKA. Chronic venous changes of RLE.    Allergies as of 05/06/2024   No Known Allergies      Medication List     PAUSE taking these medications    Eliquis  5 MG Tabs tablet Wait to take this until your doctor or other care provider tells you to start again. Generic drug: apixaban  Take 5 mg by mouth 2 (two) times daily.       TAKE these medications    acetaminophen  500 MG tablet Commonly known as: TYLENOL  Take 2 tablets (1,000 mg total) by mouth every 8 (eight) hours as needed. What changed:  medication strength how much to take when to take this reasons to take this Another medication with the same name was removed. Continue taking this medication, and follow the directions you see here.   amLODipine  5 MG tablet Commonly known as: NORVASC  Take 5 mg by mouth daily.   beta carotene 25000 UNIT capsule Take 25,000 Units by mouth daily.   Calcium +D3 500-10 MG-MCG Tabs Generic drug: Calcium  Carb-Cholecalciferol Take 1 tablet by mouth in the morning.   citalopram  20 MG tablet Commonly known as: CELEXA  Take 10 mg by mouth daily.   clotrimazole 1 % cream Commonly known as: LOTRIMIN Apply 1 Application topically 2 (two) times daily as needed (right foot).   docusate sodium  100 MG capsule Commonly known as: COLACE Take 100 mg by mouth daily.   esomeprazole  40 MG capsule Commonly known as: NEXIUM  Take 1 capsule (40 mg total) by mouth 2 (two) times  daily before a meal for 14 days, THEN 1 capsule (40 mg total) daily after breakfast. Start taking on: October 23, 2023 What changed: See the new instructions.   ezetimibe -simvastatin  10-20 MG tablet Commonly known as: VYTORIN  Take 1 tablet by mouth every evening.   furosemide  20 MG tablet Commonly known as: LASIX  Take 1 tablet (20 mg total) by mouth daily.   gabapentin  100 MG capsule Commonly known as: NEURONTIN  Take 100 mg by mouth 3 (three) times daily.   lidocaine  5 % Commonly known as: LIDODERM  Place 1 patch onto the skin daily. Remove & Discard patch within 12 hours or as directed by MD   losartan  50 MG tablet Commonly known as: COZAAR  Take 50 mg by mouth daily.   metoprolol  tartrate 25 MG tablet Commonly known as: LOPRESSOR  Take 0.5 tablets (12.5 mg total) by mouth 2 (two) times daily. Hold for SBP < 105 or HR < 60 What changed: how much to take   ondansetron  4 MG disintegrating tablet Commonly known as: ZOFRAN -ODT Take 4 mg by mouth every 8 (eight) hours as needed for nausea.   oxyCODONE  5 MG immediate release tablet Commonly known  as: Oxy IR/ROXICODONE  Take 0.5-1 tablets (2.5-5 mg total) by mouth every 6 (six) hours as needed for breakthrough pain.   oxymetazoline 0.05 % nasal spray Commonly known as: AFRIN Place 2 sprays into both nostrils 2 (two) times daily as needed (nose bleed).   polyethylene glycol 17 g packet Commonly known as: MIRALAX  / GLYCOLAX  Take 17 g by mouth daily as needed for mild constipation.   potassium chloride  SA 20 MEQ tablet Commonly known as: KLOR-CON  M Take 20 mEq by mouth daily.   senna 8.6 MG Tabs tablet Commonly known as: SENOKOT Take 2 tablets by mouth daily.   vitamin B-12 500 MCG tablet Commonly known as: CYANOCOBALAMIN  Take 500 mcg by mouth every morning.          Follow-up Information     Lonni Slain, MD Follow up.   Specialty: Cardiology Why: For follow up to discuss your metoprolol  and  Eliquis  Contact information: [address] [place] [address] [phone]         Georgina Ozell LABOR, MD Follow up.   Specialty: Orthopedic Surgery Why: For follow up regarding your pelvic fractures Contact information: [address] [place] [address] [phone]         Kendal Franky SQUIBB, MD Follow up.   Specialty: Orthopedic Surgery Why: For follow up of your left greater tuberosity of the left proximal humerus fracture Contact information: [address] [place] [address] [phone]         Primary Care Provider Follow up.   Why: For post hospitalization follow up.        [facility] Follow up.   Why: As needed Contact information: [address] [place]  [address] [phone]                Signed: Ozell CHRISTELLA Shaper, [facility] Surgery 05/06/2024, 10:50 AM Please see Amion for pager number during day hours 7:00am-4:30pm

## 2024-05-06 NOTE — TOC Transition Note (Signed)
 Transition of Care [facility]) - Discharge Note   Patient Details  Name: Ana Franco MRN: [id number] Date of Birth: 1926-02-12  Transition of Care [facility]) CM/SW Contact:  Kazuma Elena E Briya Lookabaugh, LCSW Phone Number: 05/06/2024, 11:54 AM   Clinical Narrative:    Discharge to [facility] SNF today. Room 36. Confirmed with Admissions Worker Allean. Updated MD, RN, and patient's son Marcey. Asked RN to call report. EMS paperwork completed. [facility] called for next available transport.     Final next level of care: Skilled Nursing Facility Barriers to Discharge: Barriers Resolved   Patient Goals and CMS Choice   CMS Medicare.gov Compare Post Acute Care list provided to:: Patient Represenative (must comment) Choice offered to / list presented to : Adult Children      Discharge Placement              Patient chooses bed at: [facility] Patient to be transferred to facility by: [facility] Name of family member notified: Marcey Patient and family notified of of transfer: 05/06/24  Discharge Plan and Services Additional resources added to the After Visit Summary for                                       Social Drivers of Health (SDOH) Interventions SDOH Screenings   Food Insecurity: No Food Insecurity (05/03/2024)  Housing: Low Risk  (05/03/2024)  Transportation Needs: Unmet Transportation Needs (05/03/2024)  Utilities: Not At Risk (05/03/2024)  Social Connections: Moderately Integrated (05/03/2024)  Tobacco Use: Low Risk  (05/05/2024)     Readmission Risk Interventions    05/03/2024    9:44 AM  Readmission Risk Prevention Plan  Transportation Screening Complete  PCP or Specialist Appt within 3-5 Days Complete  HRI or Home Care Consult Complete  Social Work Consult for Recovery Care Planning/Counseling Complete  Palliative Care Screening Not Applicable  Medication Review Oceanographer) Complete

## 2024-05-06 NOTE — Progress Notes (Signed)
   05/06/24 1329  Hand-off documentation  Hand-off Given Given to Transfer Unit/facility [facility] SN[facility])  Report given to (Full Name) Sari, RN   Report called to [facility] SN[facility]. Telemetry removed and CCMD called. PIV removed with no complications. AVS and printed prescription for oxycodone  handed to [facility] for receiving facility. Pt transported off unit via stretcher with all personal belongings. [facility] to transport pt to [facility].

## 2024-05-20 ENCOUNTER — Other Ambulatory Visit

## 2024-05-20 ENCOUNTER — Ambulatory Visit: Admitting: Orthopedic Surgery

## 2024-05-20 DIAGNOSIS — S32810A Multiple fractures of pelvis with stable disruption of pelvic ring, initial encounter for closed fracture: Secondary | ICD-10-CM

## 2024-05-20 DIAGNOSIS — M25512 Pain in left shoulder: Secondary | ICD-10-CM | POA: Diagnosis not present

## 2024-05-20 NOTE — Progress Notes (Signed)
 Orthopedic Surgery Progress Note   Assessment: Patient is a 89 y.o. female with two issues:  Left minimally displaced greater tuberosity proximal humerus fracture Left inferior and superior pubic rami fractures   Plan: - No operative plans at this time -Weightbearing as tolerated bilateral lower extremities -Encouraged ambulation -Continue to work with PT -Okay for active range of motion below the shoulder.  Can use sling for comfort - Return to office in 4 weeks, x-rays at next visit: AP/inlet/outlet pelvis, Grashey/scapular-Y left shoulder  ___________________________________________________________________________  Subjective: Patient comes in today for routine hospital follow-up after she was found with sustained a left proximal humerus fracture and left pelvic rami fractures.  She says she is not having any groin or hip pain at this point.  She has not been ambulating well with physical therapy.  She has mostly been in bed doing in bed exercises.  She is still having pain over the lateral aspect of her left shoulder.  Pain has been getting better in the shoulder with time.  She has not noticed any new symptoms.  No other extremity pain noted.   Physical Exam:  General: no acute distress, appears stated age Neurologic: alert, answering questions appropriately, following commands Respiratory: unlabored breathing on room air, symmetric chest rise Psychiatric: appropriate affect, normal cadence to speech  MSK:   -Left upper extremity  TTP over the lateral aspect of the shoulder.  No other tender Ness to palpation.  No pain with internal or external rotation with arm at side.  No gross deformity seen.  No open wounds seen. Fires deltoid, biceps, triceps, wrist extensors, wrist flexors, finger extensors, finger flexors  AIN/PIN/IO intact  Palpable radial pulse  Sensation intact to light touch in median/ulnar/radial/axillary nerve distributions  Hand warm and well perfused  -Left  lower extremity  No pain with logroll, no pain through range of motion of the hip, negative Stinchfield Fires hip flexors, quadriceps, hamstrings.  Has an amputation so no distal  motor testing possible Sensation intact to light touch over anterior and posterior aspect of residual limb Residual limb warm and well-perfused  Imaging: XRs of the left shoulder from 05/20/2024 were independently reviewed and interpreted, showing a minimally displaced greater tuberosity fracture of the proximal humerus.  No dislocation seen.  No other fracture seen.  XRs of the pelvis from 05/20/2024 were independently reviewed and interpreted, showing a nondisplaced left inferior ramus fracture.  There is a minimally displaced superior ramus fracture on the left side.  No dislocation seen.  No other fracture seen.   Patient name: Ana Franco Patient MRN: [id number] Date: 05/20/24

## 2024-05-21 ENCOUNTER — Telehealth: Payer: Self-pay | Admitting: Orthopedic Surgery

## 2024-05-21 NOTE — Telephone Encounter (Signed)
 [facility] re[facility] specialist called and needs her weight barring status. [phone]

## 2024-05-24 ENCOUNTER — Telehealth: Payer: Self-pay | Admitting: Orthopedic Surgery

## 2024-05-24 NOTE — Telephone Encounter (Signed)
 See other message

## 2024-05-24 NOTE — Telephone Encounter (Signed)
 I called and advised Hargis of Dr. Jeraline message, she states that she understands this

## 2024-05-24 NOTE — Telephone Encounter (Signed)
 Hargis from [employer] called wanting to know the left arm weight bearing status of pt. Call back number is [phone]. It is ok to leave a message.

## 2024-06-23 ENCOUNTER — Ambulatory Visit: Admitting: Orthopedic Surgery

## 2024-07-27 ENCOUNTER — Inpatient Hospital Stay (HOSPITAL_COMMUNITY)
Admission: EM | Admit: 2024-07-27 | Discharge: 2024-08-02 | DRG: 683 | Disposition: A | Discharge status: Nursing Home (Includes ICF) | Source: Skilled Nursing Facility | Attending: Internal Medicine | Admitting: Internal Medicine

## 2024-07-27 ENCOUNTER — Emergency Department (HOSPITAL_COMMUNITY)

## 2024-07-27 ENCOUNTER — Encounter (HOSPITAL_COMMUNITY): Payer: Self-pay

## 2024-07-27 ENCOUNTER — Other Ambulatory Visit: Payer: Self-pay

## 2024-07-27 DIAGNOSIS — K219 Gastro-esophageal reflux disease without esophagitis: Secondary | ICD-10-CM | POA: Diagnosis present

## 2024-07-27 DIAGNOSIS — D631 Anemia in chronic kidney disease: Secondary | ICD-10-CM | POA: Diagnosis present

## 2024-07-27 DIAGNOSIS — N1330 Unspecified hydronephrosis: Secondary | ICD-10-CM | POA: Diagnosis present

## 2024-07-27 DIAGNOSIS — M1990 Unspecified osteoarthritis, unspecified site: Secondary | ICD-10-CM | POA: Diagnosis present

## 2024-07-27 DIAGNOSIS — I10 Essential (primary) hypertension: Secondary | ICD-10-CM | POA: Diagnosis present

## 2024-07-27 DIAGNOSIS — R109 Unspecified abdominal pain: Secondary | ICD-10-CM | POA: Diagnosis present

## 2024-07-27 DIAGNOSIS — I739 Peripheral vascular disease, unspecified: Secondary | ICD-10-CM | POA: Diagnosis present

## 2024-07-27 DIAGNOSIS — N1832 Chronic kidney disease, stage 3b: Secondary | ICD-10-CM | POA: Diagnosis present

## 2024-07-27 DIAGNOSIS — E861 Hypovolemia: Secondary | ICD-10-CM | POA: Diagnosis present

## 2024-07-27 DIAGNOSIS — I252 Old myocardial infarction: Secondary | ICD-10-CM

## 2024-07-27 DIAGNOSIS — E785 Hyperlipidemia, unspecified: Secondary | ICD-10-CM | POA: Diagnosis present

## 2024-07-27 DIAGNOSIS — D519 Vitamin B12 deficiency anemia, unspecified: Secondary | ICD-10-CM | POA: Diagnosis present

## 2024-07-27 DIAGNOSIS — Z89512 Acquired absence of left leg below knee: Secondary | ICD-10-CM

## 2024-07-27 DIAGNOSIS — I872 Venous insufficiency (chronic) (peripheral): Secondary | ICD-10-CM | POA: Diagnosis present

## 2024-07-27 DIAGNOSIS — I129 Hypertensive chronic kidney disease with stage 1 through stage 4 chronic kidney disease, or unspecified chronic kidney disease: Secondary | ICD-10-CM | POA: Diagnosis present

## 2024-07-27 DIAGNOSIS — Z66 Do not resuscitate: Secondary | ICD-10-CM | POA: Diagnosis present

## 2024-07-27 DIAGNOSIS — N136 Pyonephrosis: Secondary | ICD-10-CM | POA: Diagnosis present

## 2024-07-27 DIAGNOSIS — Z87440 Personal history of urinary (tract) infections: Secondary | ICD-10-CM

## 2024-07-27 DIAGNOSIS — N179 Acute kidney failure, unspecified: Principal | ICD-10-CM | POA: Diagnosis present

## 2024-07-27 DIAGNOSIS — E871 Hypo-osmolality and hyponatremia: Secondary | ICD-10-CM | POA: Diagnosis present

## 2024-07-27 DIAGNOSIS — Z8619 Personal history of other infectious and parasitic diseases: Secondary | ICD-10-CM

## 2024-07-27 DIAGNOSIS — Z9181 History of falling: Secondary | ICD-10-CM

## 2024-07-27 DIAGNOSIS — Z79899 Other long term (current) drug therapy: Secondary | ICD-10-CM

## 2024-07-27 DIAGNOSIS — E872 Acidosis, unspecified: Secondary | ICD-10-CM | POA: Diagnosis present

## 2024-07-27 DIAGNOSIS — Z1624 Resistance to multiple antibiotics: Secondary | ICD-10-CM | POA: Diagnosis present

## 2024-07-27 DIAGNOSIS — R531 Weakness: Secondary | ICD-10-CM

## 2024-07-27 DIAGNOSIS — D638 Anemia in other chronic diseases classified elsewhere: Secondary | ICD-10-CM | POA: Diagnosis present

## 2024-07-27 DIAGNOSIS — Z7901 Long term (current) use of anticoagulants: Secondary | ICD-10-CM

## 2024-07-27 DIAGNOSIS — I4821 Permanent atrial fibrillation: Secondary | ICD-10-CM | POA: Diagnosis present

## 2024-07-27 DIAGNOSIS — Z8673 Personal history of transient ischemic attack (TIA), and cerebral infarction without residual deficits: Secondary | ICD-10-CM

## 2024-07-27 LAB — CBC WITH DIFFERENTIAL/PLATELET
Abs Immature Granulocytes: 0.01 K/uL (ref 0.00–0.07)
Basophils Absolute: 0 K/uL (ref 0.0–0.1)
Basophils Relative: 0 %
Eosinophils Absolute: 0 K/uL (ref 0.0–0.5)
Eosinophils Relative: 0 %
HCT: 34.1 % — ABNORMAL LOW (ref 36.0–46.0)
Hemoglobin: 11.1 g/dL — ABNORMAL LOW (ref 12.0–15.0)
Immature Granulocytes: 0 %
Lymphocytes Relative: 16 %
Lymphs Abs: 1.4 K/uL (ref 0.7–4.0)
MCH: 30.6 pg (ref 26.0–34.0)
MCHC: 32.6 g/dL (ref 30.0–36.0)
MCV: 93.9 fL (ref 80.0–100.0)
Monocytes Absolute: 1.5 K/uL — ABNORMAL HIGH (ref 0.1–1.0)
Monocytes Relative: 17 %
Neutro Abs: 5.9 K/uL (ref 1.7–7.7)
Neutrophils Relative %: 67 %
Platelets: 222 K/uL (ref 150–400)
RBC: 3.63 MIL/uL — ABNORMAL LOW (ref 3.87–5.11)
RDW: 13.6 % (ref 11.5–15.5)
WBC: 8.8 K/uL (ref 4.0–10.5)
nRBC: 0 % (ref 0.0–0.2)

## 2024-07-27 LAB — COMPREHENSIVE METABOLIC PANEL WITH GFR
ALT: 8 U/L (ref 0–44)
AST: 24 U/L (ref 15–41)
Albumin: 3.3 g/dL — ABNORMAL LOW (ref 3.5–5.0)
Alkaline Phosphatase: 93 U/L (ref 38–126)
Anion gap: 12 (ref 5–15)
BUN: 40 mg/dL — ABNORMAL HIGH (ref 8–23)
CO2: 21 mmol/L — ABNORMAL LOW (ref 22–32)
Calcium: 9 mg/dL (ref 8.9–10.3)
Chloride: 99 mmol/L (ref 98–111)
Creatinine, Ser: 1.98 mg/dL — ABNORMAL HIGH (ref 0.44–1.00)
GFR, Estimated: 23 mL/min — ABNORMAL LOW (ref >60)
Glucose, Bld: 106 mg/dL — ABNORMAL HIGH (ref 70–99)
Potassium: 5.1 mmol/L (ref 3.5–5.1)
Sodium: 132 mmol/L — ABNORMAL LOW (ref 135–145)
Total Bilirubin: 0.5 mg/dL (ref 0.0–1.2)
Total Protein: 6.6 g/dL (ref 6.5–8.1)

## 2024-07-27 MED ORDER — SODIUM CHLORIDE 0.9 % IV BOLUS
1000.0000 mL | Freq: Once | INTRAVENOUS | Status: AC
  Administered 2024-07-27: 20:00:00 1000 mL via INTRAVENOUS

## 2024-07-27 MED ORDER — SODIUM CHLORIDE 0.9 % IV BOLUS: 1000.0000 mL | Freq: Once | INTRAVENOUS | Status: DC

## 2024-07-27 NOTE — ED Triage Notes (Signed)
 Pt brought in from [facility] with complaints of abnormal labs and UTI which she is currently on antibiotics for. Pt VSS stable at this time, pt endorses abd and back pain. Acetaminophen  last at 1345

## 2024-07-27 NOTE — ED Provider Notes (Signed)
 [place] EMERGENCY DEPARTMENT AT [facility] Provider Note  CSN: [id number] Arrival date & time: 07/27/24 1735  Chief Complaint(s) abnormal labs and Urinary Tract Infection  HPI Ana Franco is a 89 y.o. female history of hypertension, hyperlipidemia presenting to the emergency department with urinary symptoms.  Patient reports some lower abdominal pain, also reports bilateral flank pain.  Denies any fevers or chills.  Reports some pain when she is urinating.  No nausea or vomiting.  No fevers or chills.  No chest pain or shortness of breath.  Reports that she has been on antibiotics from her assisted living facility but symptoms have been persistent.   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)    Chronic venous insufficiency    Depression    Difficulty in walking    Displaced trimalleolar fracture of left lower leg    Essential (primary) hypertension    GERD (gastroesophageal reflux disease)    History of blood transfusion    02/2018    History of falling    Hyperlipidemia    Hyperlipidemia    Hypertension    Infection and inflammatory reaction due to other internal orthopedic prosthetic devices, implants and grafts, subsequent encounter    Iron deficiency    Major depressive disorder    Muscle weakness (generalized)    Myocardial infarction (HCC)    nonstemi mi - 02/19/2018 at [facility] after ankle fracture    Nutritional deficiency, unspecified    Obesity    Other lack of coordination    Unspecified atrial fibrillation (HCC)    Unspecified osteoarthritis, unspecified site    Patient Active Problem List   Diagnosis Date Noted   Closed pelvic ring fracture (HCC) 05/03/2024   Trauma 05/02/2024   Sepsis (HCC) 10/21/2023   Hematemesis 10/21/2023   Hyperkalemia 10/21/2023   Acute on chronic HFrEF (heart failure  with reduced ejection fraction) (HCC) 10/12/2023   Acute CHF (congestive heart failure) (HCC) 10/12/2023   ARF (acute renal failure) 10/05/2023   SIRS (systemic inflammatory response syndrome) (HCC) 10/04/2023   Anemia of chronic disease 10/04/2023   COVID-19 virus antibody detected 10/04/2023   Acute kidney injury superimposed on stage 3a chronic kidney disease (HCC) 10/03/2023   Hypokalemia 12/15/2022   Acute diastolic CHF (congestive heart failure) (HCC) 12/14/2022   Acute hyponatremia 12/14/2022   Normocytic anemia 12/14/2022   Coronary artery disease 12/14/2022   Hx of BKA, left (HCC) 12/14/2022   Permanent atrial fibrillation (HCC) 12/18/2021   H/O: CVA (cerebrovascular accident) 12/18/2021   History of non-ST elevation myocardial infarction (NSTEMI) 12/18/2021   Acute CVA (cerebrovascular accident) (HCC) 01/07/2021   Thrombocytopenia 01/06/2021   TIA (transient ischemic attack) 01/06/2021   DNR (do not resuscitate) 01/06/2021   Chronic kidney disease CKD IIIb 01/06/2021   Paroxysmal atrial fibrillation (HCC) 05/04/2018   Gangrene of lower extremity (HCC)    Subacute osteomyelitis, left ankle and foot (HCC)    S/P ORIF (open reduction internal fixation) fracture 04/03/2018   Wound infection 04/02/2018   MRSA (methicillin resistant staph aureus) culture positive 04/02/2018   Acute blood loss anemia 04/02/2018   Closed left ankle fracture, with delayed healing, subsequent encounter 03/31/2018   Essential hypertension 06/23/2014   HLD (hyperlipidemia) 06/23/2014   GERD (gastroesophageal reflux disease) 06/23/2014   UTI (urinary tract infection) 06/23/2014   Depression 06/23/2014  Home Medication(s) Prior to Admission medications  Medication Sig Start Date End Date Taking? Authorizing Provider  acetaminophen  (TYLENOL ) 500 MG tablet Take 2 tablets (1,000 mg total) by mouth every 8 (eight) hours as needed. 05/06/24   Maczis, Michael M, PA-C  amLODipine  (NORVASC ) 5 MG tablet  Take 5 mg by mouth daily. 04/19/24   [provider]  [Paused] apixaban  (ELIQUIS ) 5 MG TABS tablet Take 5 mg by mouth 2 (two) times daily. Wait to take this until your doctor or other care provider tells you to start again.    [provider]  beta carotene 25000 UNIT capsule Take 25,000 Units by mouth daily. 12/07/21   [provider]  CALCIUM +D3 500-10 MG-MCG TABS Take 1 tablet by mouth in the morning. 12/16/22   [provider]  citalopram  (CELEXA ) 20 MG tablet Take 10 mg by mouth daily.    [provider]  clotrimazole (LOTRIMIN) 1 % cream Apply 1 Application topically 2 (two) times daily as needed (right foot). 04/29/23   [provider]  docusate sodium  (COLACE) 100 MG capsule Take 100 mg by mouth daily.    [provider]  esomeprazole  (NEXIUM ) 40 MG capsule Take 1 capsule (40 mg total) by mouth 2 (two) times daily before a meal for 14 days, THEN 1 capsule (40 mg total) daily after breakfast. Patient taking differently: Take 1 capsule (40 mg total) by mouth daily after breakfast. 10/23/23 11/05/24  Pokhrel, Laxman, MD  ezetimibe -simvastatin  (VYTORIN ) 10-20 MG tablet Take 1 tablet by mouth every evening. 11/17/21   [provider]  furosemide  (LASIX ) 20 MG tablet Take 1 tablet (20 mg total) by mouth daily. 10/14/23   Mdala-Gausi, Masiku Agatha, MD  gabapentin  (NEURONTIN ) 100 MG capsule Take 100 mg by mouth 3 (three) times daily. 11/28/21   [provider]  lidocaine  (LIDODERM ) 5 % Place 1 patch onto the skin daily. Remove & Discard patch within 12 hours or as directed by MD 05/06/24   Maczis, Michael M, PA-C  losartan  (COZAAR ) 50 MG tablet Take 50 mg by mouth daily. 04/03/24   [provider]  metoprolol  tartrate (LOPRESSOR ) 25 MG tablet Take 0.5 tablets (12.5 mg total) by mouth 2 (two) times daily. Hold for SBP < 105 or HR < 60 05/06/24   Maczis, Michael M, PA-C  ondansetron  (ZOFRAN -ODT) 4 MG disintegrating tablet Take 4  mg by mouth every 8 (eight) hours as needed for nausea. 04/16/24   [provider]  oxyCODONE  (OXY IR/ROXICODONE ) 5 MG immediate release tablet Take 0.5-1 tablets (2.5-5 mg total) by mouth every 6 (six) hours as needed for breakthrough pain. 05/06/24   Maczis, Michael M, PA-C  oxymetazoline (AFRIN) 0.05 % nasal spray Place 2 sprays into both nostrils 2 (two) times daily as needed (nose bleed).    [provider]  polyethylene glycol (MIRALAX  / GLYCOLAX ) 17 g packet Take 17 g by mouth daily as needed for mild constipation. 05/06/24   Maczis, Michael M, PA-C  potassium chloride  SA (KLOR-CON  M) 20 MEQ tablet Take 20 mEq by mouth daily. 04/21/24   [provider]  senna (SENOKOT) 8.6 MG TABS tablet Take 2 tablets by mouth daily.    [provider]  vitamin B-12 (CYANOCOBALAMIN ) 500 MCG tablet Take 500 mcg by mouth every morning.    [provider]  Past Surgical History Past Surgical History:  Procedure Laterality Date   AMPUTATION Left 04/10/2018   Procedure: LEFT BELOW KNEE AMPUTATION;  Surgeon: Harden Jerona GAILS, MD;  Location: [facility] OR;  Service: Orthopedics;  Laterality: Left;   APPENDECTOMY     APPLICATION OF WOUND VAC Left 03/31/2018   Procedure: APPLICATION OF WOUND VAC;  Surgeon: Beverley Evalene BIRCH, MD;  Location: [facility] ORS;  Service: Orthopedics;  Laterality: Left;   HARDWARE REMOVAL Left 03/31/2018   Procedure: HARDWARE REMOVAL;  Surgeon: Beverley Evalene BIRCH, MD;  Location: [facility] ORS;  Service: Orthopedics;  Laterality: Left;   IRRIGATION AND DEBRIDEMENT FOOT Left 03/31/2018   Procedure: IRRIGATION AND DEBRIDEMENT OF LEFT ANKLE;  Surgeon: Beverley Evalene BIRCH, MD;  Location: [facility] ORS;  Service: Orthopedics;  Laterality: Left;   ORIF ANKLE FRACTURE Right 06/13/2014   Procedure: OPEN REDUCTION INTERNAL FIXATION (ORIF) BIMALLEOLAR ANKLE FRACTURE;   Surgeon: Toribio JULIANNA Beverley, MD;  Location: [facility] OR;  Service: Orthopedics;  Laterality: Right;   Family History History reviewed. No pertinent family history.  Social History Social History[1] Allergies Patient has no known allergies.  Review of Systems Review of Systems  All other systems reviewed and are negative.   Physical Exam Vital Signs  I have reviewed the triage vital signs BP 133/66   Pulse 72   Temp (!) 97.5 F (36.4 C) (Oral)   Resp 15   SpO2 95%  Physical Exam Vitals and nursing note reviewed.  Constitutional:      General: She is not in acute distress.    Appearance: She is well-developed.  HENT:     Head: Normocephalic and atraumatic.     Mouth/Throat:     Mouth: Mucous membranes are moist.  Eyes:     Pupils: Pupils are equal, round, and reactive to light.  Cardiovascular:     Rate and Rhythm: Normal rate and regular rhythm.     Heart sounds: No murmur heard. Pulmonary:     Effort: Pulmonary effort is normal. No respiratory distress.     Breath sounds: Normal breath sounds.  Abdominal:     General: Abdomen is flat.     Palpations: Abdomen is soft.     Tenderness: There is no abdominal tenderness. There is right CVA tenderness and left CVA tenderness.  Musculoskeletal:        General: No tenderness.     Right lower leg: No edema.     Left lower leg: No edema.  Skin:    General: Skin is warm and dry.  Neurological:     General: No focal deficit present.     Mental Status: She is alert. Mental status is at baseline.  Psychiatric:        Mood and Affect: Mood normal.        Behavior: Behavior normal.     ED Results and Treatments Labs (all labs ordered are listed, but only abnormal results are displayed) Labs Reviewed  COMPREHENSIVE METABOLIC PANEL WITH GFR - Abnormal; Notable for the following components:      Result Value   Sodium 132 (*)    CO2 21 (*)    Glucose, Bld 106 (*)    BUN 40 (*)    Creatinine, Ser 1.98 (*)    Albumin 3.3 (*)     GFR, Estimated 23 (*)    All other components within normal limits  CBC WITH DIFFERENTIAL/PLATELET - Abnormal; Notable for the following components:   RBC 3.63 (*)    Hemoglobin 11.1 (*)  HCT 34.1 (*)    Monocytes Absolute 1.5 (*)    All other components within normal limits  URINALYSIS, W/ REFLEX TO CULTURE (INFECTION SUSPECTED)                                                                                                                          Radiology CT Renal Stone Study Result Date: 07/27/2024 EXAM: CT ABDOMEN AND PELVIS WITHOUT CONTRAST 07/27/2024 08:42:19 PM TECHNIQUE: CT of the abdomen and pelvis was performed without the administration of intravenous contrast. Multiplanar reformatted images are provided for review. Automated exposure control, iterative reconstruction, and/or weight-based adjustment of the mA/kV was utilized to reduce the radiation dose to as low as reasonably achievable. COMPARISON: CT chest abdomen and pelvis 05/02/2024. CLINICAL HISTORY: Abdominal/flank pain, stone suspected. FINDINGS: LOWER CHEST: Cardiac enlargement. Small pleural effusions. Diffuse interstitial changes in the lung bases with bronchial wall thickening likely fibrosis and chronic bronchitis. LIVER: The liver is unremarkable. GALLBLADDER AND BILE DUCTS: Gallbladder is unremarkable. No biliary ductal dilatation. SPLEEN: The spleen is unremarkable. PANCREAS: The pancreas is unremarkable. ADRENAL GLANDS: The adrenal glands are unremarkable. KIDNEYS, URETERS AND BLADDER: Prominent bilateral hydronephrosis and hydroureter, new since prior study. No obstructing stone or mass is identified. This likely represents reflux disease. The bladder wall is diffusely thickened with trabeculation and cellular formation and bilateral bladder diverticula. This is most likely to represent outlet obstruction or neurogenic bladder, although cystitis could also have this appearance. The bladder appears similar to the  prior study. No discrete filling defects or bladder stones are identified. No perinephric or periureteral stranding. GI AND BOWEL: Small esophageal hiatal hernia. The stomach, small bowel, and colon are not abnormally distended, and no wall thickening is appreciated. The appendix is normal. Diverticula in the sigmoid colon without evidence of acute diverticulitis. There is no bowel obstruction. PERITONEUM AND RETROPERITONEUM: No free air or free fluid in the abdomen. VASCULATURE: Aorta is normal in caliber. No calcification of the aorta. No aneurysm. LYMPH NODES: No lymphadenopathy. REPRODUCTIVE ORGANS: No acute abnormality. BONES AND SOFT TISSUES: Degenerative changes in the spine. Thoracolumbar scoliosis convex towards the left. Degenerative changes in the hips. Old ununited fracture deformity of the superior pubic ramus on the left with healed fracture deformity of the inferior pubic ramus. No focal soft tissue abnormality. IMPRESSION: 1. Prominent bilateral hydronephrosis and hydroureter, new since prior study, without an obstructing stone or mass identified, likely representing reflux disease. 2. Bladder wall thickening with trabeculation, cellule formation, and bilateral diverticula, similar to prior study, most likely due to outlet obstruction or neurogenic bladder, although cystitis could also have this appearance. Electronically signed by: Elsie Gravely MD 07/27/2024 08:50 PM EST RP Workstation: [id number]    Pertinent labs & imaging results that were available during my care of the patient were reviewed by me and considered in my medical decision making (see MDM for details).  Medications Ordered in ED Medications  sodium chloride  0.9 % bolus 1,000 mL (0 mLs Intravenous  Stopped 07/27/24 2312)                                                                                                                                     Procedures Procedures  (including critical care time)  Medical  Decision Making / ED Course   MDM:  89 year old presenting to the emergency department with urinary symptoms.  Patient overall well-appearing, vital signs are reassuring.  Concern for possible persistent infection.  She has had prior urine cultures which grew ESBL.  Unclear antibiotics that she has been on at her nursing facility.  Given flank pain may also have ascending infection.  CT scan was obtained which shows bilateral hydronephrosis without obstructing stone as well as chronic bladder changes.  Pending urinalysis.  Will give fluids for AKI as well.  Patient not septic appearing.  Urinalysis obtained by nursing and pending at this time.  Clinical Course as of 07/27/24 2354  Tue Jul 27, 2024  2333 H/o MDR UTI here with flank pain Has AKI CT with BL hydro Pending UA Anticipate admission [PC]  2353 Signed out to Dr. Trine pending UA. If positive likely admit for UTI/ESBL. [WS]    Clinical Course User Index [PC] Cardama, Raynell Moder, MD [WS] Francesca, Elsie CROME, MD     Additional history obtained: -Additional history obtained from ems -External records from outside source obtained and reviewed including: Chart review including previous notes, labs, imaging, consultation notes including prior urine culture data   Lab Tests: -I ordered, reviewed, and interpreted labs.   The pertinent results include:   Labs Reviewed  COMPREHENSIVE METABOLIC PANEL WITH GFR - Abnormal; Notable for the following components:      Result Value   Sodium 132 (*)    CO2 21 (*)    Glucose, Bld 106 (*)    BUN 40 (*)    Creatinine, Ser 1.98 (*)    Albumin 3.3 (*)    GFR, Estimated 23 (*)    All other components within normal limits  CBC WITH DIFFERENTIAL/PLATELET - Abnormal; Notable for the following components:   RBC 3.63 (*)    Hemoglobin 11.1 (*)    HCT 34.1 (*)    Monocytes Absolute 1.5 (*)    All other components within normal limits  URINALYSIS, W/ REFLEX TO CULTURE (INFECTION  SUSPECTED)    Notable for AKI  EKG   Imaging Studies ordered: I ordered imaging studies including CT abdomen On my interpretation imaging demonstrates hydronephrosis w/o focal obstructive process I independently visualized and interpreted imaging. 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)    Chronic venous insufficiency    Depression    Difficulty in walking    Displaced trimalleolar fracture of left lower leg    Essential (primary) hypertension    GERD (gastroesophageal reflux disease)    History of blood transfusion    02/2018    History of falling    Hyperlipidemia    Hyperlipidemia    Hypertension    Infection and inflammatory reaction due to other internal orthopedic prosthetic devices, implants and grafts, subsequent encounter    Iron deficiency    Major depressive disorder    Muscle weakness (generalized)    Myocardial infarction (HCC)    nonstemi mi - 02/19/2018 at [facility] after ankle fracture    Nutritional deficiency, unspecified    Obesity    Other lack of coordination    Unspecified atrial fibrillation (HCC)    Unspecified osteoarthritis, unspecified site       Dispostion: Disposition decision including need for hospitalization was considered, and patient disposition pending at time of sign out.    Final Clinical Impression(s) / ED  Diagnoses Final diagnoses:  AKI (acute kidney injury)     This chart was dictated using voice recognition software.  Despite best efforts to proofread,  errors can occur which can change the documentation meaning.     [1]  Social History Tobacco Use   Smoking status: Never   Smokeless tobacco: Never  Vaping Use   Vaping status: Never Used  Substance Use Topics   Alcohol use: No   Drug use: No     Francesca Elsie CROME, MD 07/27/24 2354

## 2024-07-27 NOTE — ED Notes (Signed)
 Patient transported to CT

## 2024-07-28 DIAGNOSIS — M1990 Unspecified osteoarthritis, unspecified site: Secondary | ICD-10-CM | POA: Diagnosis present

## 2024-07-28 DIAGNOSIS — N3000 Acute cystitis without hematuria: Secondary | ICD-10-CM

## 2024-07-28 DIAGNOSIS — R1084 Generalized abdominal pain: Secondary | ICD-10-CM | POA: Diagnosis not present

## 2024-07-28 DIAGNOSIS — Z1624 Resistance to multiple antibiotics: Secondary | ICD-10-CM | POA: Diagnosis present

## 2024-07-28 DIAGNOSIS — I872 Venous insufficiency (chronic) (peripheral): Secondary | ICD-10-CM | POA: Diagnosis present

## 2024-07-28 DIAGNOSIS — Z8673 Personal history of transient ischemic attack (TIA), and cerebral infarction without residual deficits: Secondary | ICD-10-CM | POA: Diagnosis not present

## 2024-07-28 DIAGNOSIS — E861 Hypovolemia: Secondary | ICD-10-CM | POA: Diagnosis present

## 2024-07-28 DIAGNOSIS — D519 Vitamin B12 deficiency anemia, unspecified: Secondary | ICD-10-CM | POA: Diagnosis present

## 2024-07-28 DIAGNOSIS — N1330 Unspecified hydronephrosis: Secondary | ICD-10-CM | POA: Diagnosis not present

## 2024-07-28 DIAGNOSIS — N136 Pyonephrosis: Secondary | ICD-10-CM | POA: Diagnosis present

## 2024-07-28 DIAGNOSIS — I129 Hypertensive chronic kidney disease with stage 1 through stage 4 chronic kidney disease, or unspecified chronic kidney disease: Secondary | ICD-10-CM | POA: Diagnosis present

## 2024-07-28 DIAGNOSIS — I252 Old myocardial infarction: Secondary | ICD-10-CM | POA: Diagnosis not present

## 2024-07-28 DIAGNOSIS — E871 Hypo-osmolality and hyponatremia: Secondary | ICD-10-CM | POA: Diagnosis present

## 2024-07-28 DIAGNOSIS — N1832 Chronic kidney disease, stage 3b: Secondary | ICD-10-CM | POA: Diagnosis present

## 2024-07-28 DIAGNOSIS — Z8619 Personal history of other infectious and parasitic diseases: Secondary | ICD-10-CM | POA: Diagnosis not present

## 2024-07-28 DIAGNOSIS — Z66 Do not resuscitate: Secondary | ICD-10-CM | POA: Diagnosis present

## 2024-07-28 DIAGNOSIS — I739 Peripheral vascular disease, unspecified: Secondary | ICD-10-CM | POA: Diagnosis present

## 2024-07-28 DIAGNOSIS — N179 Acute kidney failure, unspecified: Principal | ICD-10-CM | POA: Diagnosis present

## 2024-07-28 DIAGNOSIS — E872 Acidosis, unspecified: Secondary | ICD-10-CM | POA: Diagnosis present

## 2024-07-28 DIAGNOSIS — Z7901 Long term (current) use of anticoagulants: Secondary | ICD-10-CM | POA: Diagnosis not present

## 2024-07-28 DIAGNOSIS — Z89512 Acquired absence of left leg below knee: Secondary | ICD-10-CM | POA: Diagnosis not present

## 2024-07-28 DIAGNOSIS — D631 Anemia in chronic kidney disease: Secondary | ICD-10-CM | POA: Diagnosis present

## 2024-07-28 DIAGNOSIS — E785 Hyperlipidemia, unspecified: Secondary | ICD-10-CM | POA: Diagnosis present

## 2024-07-28 DIAGNOSIS — Z87440 Personal history of urinary (tract) infections: Secondary | ICD-10-CM | POA: Diagnosis not present

## 2024-07-28 DIAGNOSIS — R10A3 Flank pain, bilateral: Secondary | ICD-10-CM | POA: Diagnosis present

## 2024-07-28 DIAGNOSIS — K219 Gastro-esophageal reflux disease without esophagitis: Secondary | ICD-10-CM | POA: Diagnosis present

## 2024-07-28 DIAGNOSIS — Z9181 History of falling: Secondary | ICD-10-CM | POA: Diagnosis not present

## 2024-07-28 DIAGNOSIS — I4821 Permanent atrial fibrillation: Secondary | ICD-10-CM | POA: Diagnosis present

## 2024-07-28 LAB — CBC
HCT: 28.9 % — ABNORMAL LOW (ref 36.0–46.0)
Hemoglobin: 9.5 g/dL — ABNORMAL LOW (ref 12.0–15.0)
MCH: 31 pg (ref 26.0–34.0)
MCHC: 32.9 g/dL (ref 30.0–36.0)
MCV: 94.4 fL (ref 80.0–100.0)
Platelets: 200 K/uL (ref 150–400)
RBC: 3.06 MIL/uL — ABNORMAL LOW (ref 3.87–5.11)
RDW: 13.7 % (ref 11.5–15.5)
WBC: 10.4 K/uL (ref 4.0–10.5)
nRBC: 0 % (ref 0.0–0.2)

## 2024-07-28 LAB — URINALYSIS, W/ REFLEX TO CULTURE (INFECTION SUSPECTED)
Bilirubin Urine: NEGATIVE
Glucose, UA: NEGATIVE mg/dL
Ketones, ur: NEGATIVE mg/dL
Nitrite: NEGATIVE
Protein, ur: 30 mg/dL — AB
Specific Gravity, Urine: 1.009 (ref 1.005–1.030)
pH: 6 (ref 5.0–8.0)

## 2024-07-28 LAB — URINALYSIS, MICROSCOPIC (REFLEX): WBC, UA: >50 WBC/hpf (ref 0–5)

## 2024-07-28 LAB — BASIC METABOLIC PANEL WITH GFR
Anion gap: 11 (ref 5–15)
BUN: 37 mg/dL — ABNORMAL HIGH (ref 8–23)
CO2: 20 mmol/L — ABNORMAL LOW (ref 22–32)
Calcium: 8.2 mg/dL — ABNORMAL LOW (ref 8.9–10.3)
Chloride: 100 mmol/L (ref 98–111)
Creatinine, Ser: 1.72 mg/dL — ABNORMAL HIGH (ref 0.44–1.00)
GFR, Estimated: 27 mL/min — ABNORMAL LOW (ref >60)
Glucose, Bld: 102 mg/dL — ABNORMAL HIGH (ref 70–99)
Potassium: 4.6 mmol/L (ref 3.5–5.1)
Sodium: 131 mmol/L — ABNORMAL LOW (ref 135–145)

## 2024-07-28 LAB — MAGNESIUM: Magnesium: 1.8 mg/dL (ref 1.7–2.4)

## 2024-07-28 LAB — PHOSPHORUS: Phosphorus: 3.4 mg/dL (ref 2.5–4.6)

## 2024-07-28 MED ORDER — PANTOPRAZOLE SODIUM 40 MG PO TBEC
40.0000 mg | DELAYED_RELEASE_TABLET | Freq: Every day | ORAL | Status: DC
  Administered 2024-07-28 – 2024-08-02 (×6): 40 mg via ORAL
  Filled 2024-07-28 (×6): qty 1

## 2024-07-28 MED ORDER — SODIUM CHLORIDE 0.9 % IV SOLN
500.0000 mg | Freq: Two times a day (BID) | INTRAVENOUS | Status: DC
  Administered 2024-07-28 – 2024-07-29 (×2): 500 mg via INTRAVENOUS
  Filled 2024-07-28 (×3): qty 10

## 2024-07-28 MED ORDER — ORAL CARE MOUTH RINSE: 15.0000 mL | OROMUCOSAL | Status: AC | PRN

## 2024-07-28 MED ORDER — CITALOPRAM HYDROBROMIDE 20 MG PO TABS
10.0000 mg | ORAL_TABLET | Freq: Every day | ORAL | Status: DC
  Administered 2024-07-28 – 2024-08-02 (×6): 10 mg via ORAL
  Filled 2024-07-28 (×6): qty 1

## 2024-07-28 MED ORDER — SENNA 8.6 MG PO TABS
2.0000 | ORAL_TABLET | Freq: Every day | ORAL | Status: DC
  Administered 2024-07-28 – 2024-08-02 (×6): 17.2 mg via ORAL
  Filled 2024-07-28 (×6): qty 2

## 2024-07-28 MED ORDER — SODIUM CHLORIDE 0.9 % IV SOLN: INTRAVENOUS | Status: AC

## 2024-07-28 MED ORDER — GABAPENTIN 100 MG PO CAPS
100.0000 mg | ORAL_CAPSULE | Freq: Three times a day (TID) | ORAL | Status: DC
  Administered 2024-07-28 – 2024-08-02 (×14): 100 mg via ORAL
  Filled 2024-07-28 (×14): qty 1

## 2024-07-28 MED ORDER — ACETAMINOPHEN 500 MG PO TABS
500.0000 mg | ORAL_TABLET | Freq: Four times a day (QID) | ORAL | Status: DC | PRN
  Administered 2024-08-01: 500 mg via ORAL
  Filled 2024-07-28: qty 1

## 2024-07-28 MED ORDER — METOPROLOL TARTRATE 25 MG PO TABS
12.5000 mg | ORAL_TABLET | Freq: Two times a day (BID) | ORAL | Status: DC
  Administered 2024-07-28 – 2024-08-02 (×10): 12.5 mg via ORAL
  Filled 2024-07-28 (×10): qty 1

## 2024-07-28 MED ORDER — POTASSIUM CHLORIDE CRYS ER 20 MEQ PO TBCR: 20.0000 meq | EXTENDED_RELEASE_TABLET | Freq: Every day | ORAL | Status: DC

## 2024-07-28 MED ORDER — PROCHLORPERAZINE EDISYLATE 10 MG/2ML IJ SOLN
5.0000 mg | Freq: Four times a day (QID) | INTRAMUSCULAR | Status: DC | PRN
  Administered 2024-07-29: 16:00:00 5 mg via INTRAVENOUS
  Filled 2024-07-28: qty 2

## 2024-07-28 MED ORDER — SODIUM CHLORIDE 0.9 % IV SOLN
1.0000 g | Freq: Once | INTRAVENOUS | Status: AC
  Administered 2024-07-28: 03:00:00 1 g via INTRAVENOUS
  Filled 2024-07-28: qty 20

## 2024-07-28 MED ORDER — ENOXAPARIN SODIUM 30 MG/0.3ML IJ SOSY
30.0000 mg | PREFILLED_SYRINGE | INTRAMUSCULAR | Status: DC
  Administered 2024-07-28 – 2024-07-31 (×4): 30 mg via SUBCUTANEOUS
  Filled 2024-07-28 (×4): qty 0.3

## 2024-07-28 NOTE — Assessment & Plan Note (Signed)
 Stump appears well healed Consider 81 mg ASA daily if not resuming AC

## 2024-07-28 NOTE — Plan of Care (Signed)

## 2024-07-28 NOTE — Assessment & Plan Note (Signed)
 Normocytic anemia Appears to be stable

## 2024-07-28 NOTE — ED Provider Notes (Signed)
 I assumed care of this patient from previous provider.  Please see their note for further details of history, exam, and MDM.   Briefly patient is a 89 y.o. female who presented abd/flank pain.   Clinical Course as of 07/28/24 0225  Tue Jul 27, 2024  2333 H/o MDR UTI here with flank pain Has AKI CT with BL hydro Pending UA Anticipate admission [PC]  2353 Signed out to Dr. Trine pending UA. If positive likely admit for UTI/ESBL. [WS]  Wed Jul 28, 2024  0212 UA is cloudy with leukocytes concerning for UTI.  Given multidrug-resistant history and AKI, will need to admit patient start patient on IV meropenem  while cultures result.   [PC]  0224 Spoke with Dr. Shona from the hospitalist service who will admit patient  [PC]    Clinical Course User Index [PC] Johaan Ryser, Raynell Moder, MD [WS] Francesca Elsie CROME, MD       Trine Raynell Moder, MD 07/28/24 [name]

## 2024-07-28 NOTE — Progress Notes (Signed)
 Patient admitted for UTI, VSS, skin intact, sacrum reddened, applied mepilex, in bed resting, call light in reach

## 2024-07-28 NOTE — Assessment & Plan Note (Addendum)
 CT renal stone study revealed prominent bilateral hydronephrosis and hydroureter new since prior study Without an obstructing stone or mass identified, likely representing reflux disease vs. cystitis Consider urology evaluation inpatient vs. outpatient

## 2024-07-28 NOTE — Assessment & Plan Note (Signed)
 Chronic, stable

## 2024-07-28 NOTE — Progress Notes (Signed)
 ED Pharmacy Antibiotic Sign Off An antibiotic consult was received from an ED provider for Meropenem  per pharmacy dosing for UTI/hx ESBL Ecoli. A chart review was completed to assess appropriateness.   The following one time order(s) were placed:  Meropenem  1gm IV  Further antibiotic and/or antibiotic pharmacy consults should be ordered by the admitting provider if indicated.   Thank you for allowing pharmacy to be a part of this patient's care.   Rosaline Millet, [facility]  Clinical Pharmacist 07/28/2024 2:22 AM

## 2024-07-28 NOTE — Assessment & Plan Note (Signed)
 No longer on Eliquis  Rate controlled with metoprolol  She was supposed to f/u with cardiology

## 2024-07-28 NOTE — Progress Notes (Signed)
 Progress Note   Patient: Ana Franco FMW:[id number] DOB: October 21, 1925 DOA: 07/27/2024     0 DOS: the patient was seen and examined on 07/28/2024   Brief hospital course: 89yo with h/o afib on Eliquis , stage 3b CKD, CVA, PVD s/p L BKA, and ESSBL UTI (10/2023) who presented on 12/16 with abdominal and B flank pain.  +UTI.  CT with B hydronephrosis and hydroureter thought to be related to reflux disease.  She was started on meropenem .  Assessment and Plan:  Assessment & Plan UTI (urinary tract infection) Presented with reported abdominal and flank pain No current symptoms, unable to provide history UA is somewhat abnormal H/o ESBL UTI so on contact precautions Urine culture is pending Continue meropenem  for now Bilateral hydronephrosis CT renal stone study revealed prominent bilateral hydronephrosis and hydroureter new since prior study Without an obstructing stone or mass identified, likely representing reflux disease vs. cystitis Consider urology evaluation inpatient vs. outpatient AKI (acute kidney injury) Baseline creatinine 1.4-1.6 with GFR 30-35 Presented with creatinine 1.98, GFR 23 Improved to 1.72, 27 today Avoid nephrotoxic agents, dehydration, and hypotension Repeat BMP in the morning. Permanent atrial fibrillation (HCC) No longer on Eliquis  Rate controlled with metoprolol  She was supposed to f/u with cardiology  Hx of BKA, left (HCC) H/O: CVA (cerebrovascular accident) Stump appears well healed Consider 81 mg ASA daily if not resuming AC HLD (hyperlipidemia) Hold Vytorin  given limited inpatient utility Essential hypertension Resume metoprolol  with hold parameters Hold losartan  for now, as BP is controlled without BP goal is < 160 based on age Anemia of chronic disease Normocytic anemia Appears to be stable Hyponatremia Chronic, stable DNR (do not resuscitate) DNR confirmed at the time of admission Patient will need a gold out of facility DNR form at the time  of discharge      Consultants: PT OT Inpatient case management  Procedures: None  Antibiotics: Meropenem  12/17-  30 Day Unplanned Readmission Risk Score    Flowsheet Row ED to Hosp-Admission (Current) from 07/27/2024 in [facility] Emergency Department at [facility]  30 Day Unplanned Readmission Risk Score (%) 24.75 Filed at 07/28/2024 0400    This score is the patient's risk of an unplanned readmission within 30 days of being discharged (0 -100%). The score is based on dignosis, age, lab data, medications, orders, and past utilization.   Low:  0-14.9   Medium: 15-21.9   High: 22-29.9   Extreme: 30 and above         Subjective: Patient is pleasant, appears comfortable.  No complaints.  Oriented x 1.  Attempted to reach her son by telephone, left voice mail.  Physical Exam: Vitals:   07/28/24 0123 07/28/24 0430 07/28/24 0624 07/28/24 0630  BP: 134/70 124/63  (!) 133/56  Pulse: 90 85  82  Resp: 14 14  19   Temp: 99.3 F (37.4 C)  99 F (37.2 C)   TempSrc: Oral     SpO2: 92% 93%  95%      Intake/Output Summary (Last 24 hours) at 07/28/2024 0743 Last data filed at 07/28/2024 0323 Gross per 24 hour  Intake 1100 ml  Output --  Net 1100 ml     Exam:  General:  Appears calm and comfortable and is in NAD Eyes:  normal lids, iris ENT:  grossly normal hearing, lips & tongue, mmm Cardiovascular:  RRR. No LE edema.  Respiratory:   CTA bilaterally with no wheezes/rales/rhonchi.  Normal respiratory effort. Abdomen:  soft, NT, ND, no flank pain to  palpation Skin:  no rash or induration seen on limited exam Musculoskeletal:  s/p L BKA with stump C/D/I Psychiatric:  grossly normal mood and affect, speech fluent and appropriate, AOx1 Neurologic:  CN 2-12 grossly intact, moves all extremities in coordinated fashion  Data Reviewed: I have reviewed the patient's lab results since admission.  Pertinent labs for today include:  Na++ 131, stable CO2 20 BUN  37/Creatinine 1.72/GFR 27 WBC 10.4 Hgb 9.5 UA: small Hgb, large LE, 30 protein; many bacteria Urine culture pending Blood cultures pending    Family Communication: None present; I was unable to reach her son by voice mail  Disposition: Status is: Inpatient Remains inpatient appropriate because: ongoing management  Planned Discharge Destination: Skilled nursing facility    Time spent: 50 minutes  Author: Delon Herald, MD 07/28/2024 7:41 AM  For on call review [email or web address].

## 2024-07-28 NOTE — Assessment & Plan Note (Addendum)
 Baseline creatinine 1.4-1.6 with GFR 30-35 Presented with creatinine 1.98, GFR 23 Improved to 1.72, 27 today Avoid nephrotoxic agents, dehydration, and hypotension Repeat BMP in the morning.

## 2024-07-28 NOTE — Assessment & Plan Note (Signed)
 DNR confirmed at the time of admission Patient will need a gold out of facility DNR form at the time of discharge

## 2024-07-28 NOTE — Assessment & Plan Note (Signed)
 Presented with reported abdominal and flank pain No current symptoms, unable to provide history UA is somewhat abnormal H/o ESBL UTI so on contact precautions Urine culture is pending Continue meropenem  for now

## 2024-07-28 NOTE — Assessment & Plan Note (Signed)
 Resume metoprolol  with hold parameters Hold losartan  for now, as BP is controlled without BP goal is < 160 based on age

## 2024-07-28 NOTE — Assessment & Plan Note (Signed)
 Hold Vytorin  given limited inpatient utility

## 2024-07-28 NOTE — H&P (Signed)
 History and Physical  Ana Franco FMW:[id number] DOB: Nov 29, 1925 DOA: 07/27/2024  Referring physician: Dr. Trine, EDP  PCP: Pcp, No  Outpatient Specialists: Orthopedic surgery. Patient coming from: Assisted living facility.  Chief Complaint: Abdominal pain, flank pain, and normal labs.  HPI: Ana Franco is a 89 y.o. female with medical history significant for permanent A-fib on Eliquis , CKD 3B, bladder wall trabeculation with multiple bladder diverticula, history of CVA, uses a walker at baseline, history of left below the knee amputation, history of ESBL E. COLI UTI 10/21/2023, who presents to the ER due to lower abdominal pain and bilateral flank pain.  Associated with some pain with urination.  Has been on antibiotics from the assisted living facility however her symptoms have persisted.  In the ER, bilateral flank tenderness noted on exam with low-grade fever 99.3.  Lab work was notable for elevated creatinine 1.98 above baseline of 1.3.  UA was positive for pyuria.  CT renal stone study revealed prominent bilateral hydronephrosis and hydroureter new since prior study.  Without an obstructing stone or mass identified, likely representing reflux disease.  Bladder wall thickening with trabeculation, cellule formation and bilateral diverticula similar to prior study most likely due to outlet obstruction or neurogenic bladder, although cystitis could also have this appearance.  The patient received Merrem  in the ER for presumed ESBL E. coli UTI.  Admitted by [facility], hospitalist service  ED Course: Temperature 99.3.  BP 121/62, pulse 79, respiratory rate 19, O2 saturation 93% on room air.  Review of Systems: Review of systems as noted in the HPI. All other systems reviewed and are negative.   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)    Chronic venous insufficiency    Depression     Difficulty in walking    Displaced trimalleolar fracture of left lower leg    Essential (primary) hypertension    GERD (gastroesophageal reflux disease)    History of blood transfusion    02/2018    History of falling    Hyperlipidemia    Hyperlipidemia    Hypertension    Infection and inflammatory reaction due to other internal orthopedic prosthetic devices, implants and grafts, subsequent encounter    Iron deficiency    Major depressive disorder    Muscle weakness (generalized)    Myocardial infarction (HCC)    nonstemi mi - 02/19/2018 at [facility] after ankle fracture    Nutritional deficiency, unspecified    Obesity    Other lack of coordination    Unspecified atrial fibrillation (HCC)    Unspecified osteoarthritis, unspecified site    Past Surgical History:  Procedure Laterality Date   AMPUTATION Left 04/10/2018   Procedure: LEFT BELOW KNEE AMPUTATION;  Surgeon: Harden Jerona GAILS, MD;  Location: [facility] OR;  Service: Orthopedics;  Laterality: Left;   APPENDECTOMY     APPLICATION OF WOUND VAC Left 03/31/2018   Procedure: APPLICATION OF WOUND VAC;  Surgeon: Beverley Evalene BIRCH, MD;  Location: [facility] ORS;  Service: Orthopedics;  Laterality: Left;   HARDWARE REMOVAL Left 03/31/2018   Procedure: HARDWARE REMOVAL;  Surgeon: Beverley Evalene BIRCH, MD;  Location: [facility] ORS;  Service: Orthopedics;  Laterality: Left;   IRRIGATION AND DEBRIDEMENT FOOT Left 03/31/2018   Procedure: IRRIGATION AND DEBRIDEMENT OF LEFT ANKLE;  Surgeon: Beverley Evalene BIRCH, MD;  Location: [facility] ORS;  Service: Orthopedics;  Laterality: Left;  ORIF ANKLE FRACTURE Right 06/13/2014   Procedure: OPEN REDUCTION INTERNAL FIXATION (ORIF) BIMALLEOLAR ANKLE FRACTURE;  Surgeon: Toribio JULIANNA Chancy, MD;  Location: [facility] OR;  Service: Orthopedics;  Laterality: Right;    Social History:  reports that she has never smoked. She has never used smokeless tobacco. She reports that she does not drink alcohol and does not use  drugs.   Allergies[1]  Family history: None reported.  Prior to Admission medications  Medication Sig Start Date End Date Taking? Authorizing Provider  acetaminophen  (TYLENOL ) 500 MG tablet Take 2 tablets (1,000 mg total) by mouth every 8 (eight) hours as needed. 05/06/24   Maczis, Michael M, PA-C  amLODipine  (NORVASC ) 5 MG tablet Take 5 mg by mouth daily. 04/19/24   [provider]  [Paused] apixaban  (ELIQUIS ) 5 MG TABS tablet Take 5 mg by mouth 2 (two) times daily. Wait to take this until your doctor or other care provider tells you to start again.    [provider]  beta carotene 25000 UNIT capsule Take 25,000 Units by mouth daily. 12/07/21   [provider]  CALCIUM +D3 500-10 MG-MCG TABS Take 1 tablet by mouth in the morning. 12/16/22   [provider]  citalopram  (CELEXA ) 20 MG tablet Take 10 mg by mouth daily.    [provider]  clotrimazole (LOTRIMIN) 1 % cream Apply 1 Application topically 2 (two) times daily as needed (right foot). 04/29/23   [provider]  docusate sodium  (COLACE) 100 MG capsule Take 100 mg by mouth daily.    [provider]  esomeprazole  (NEXIUM ) 40 MG capsule Take 1 capsule (40 mg total) by mouth 2 (two) times daily before a meal for 14 days, THEN 1 capsule (40 mg total) daily after breakfast. Patient taking differently: Take 1 capsule (40 mg total) by mouth daily after breakfast. 10/23/23 11/05/24  Pokhrel, Laxman, MD  ezetimibe -simvastatin  (VYTORIN ) 10-20 MG tablet Take 1 tablet by mouth every evening. 11/17/21   [provider]  furosemide  (LASIX ) 20 MG tablet Take 1 tablet (20 mg total) by mouth daily. 10/14/23   Mdala-Gausi, Masiku Agatha, MD  gabapentin  (NEURONTIN ) 100 MG capsule Take 100 mg by mouth 3 (three) times daily. 11/28/21   [provider]  lidocaine  (LIDODERM ) 5 % Place 1 patch onto the skin daily. Remove & Discard patch within 12 hours or as directed by MD 05/06/24   Maczis,  Michael M, PA-C  losartan  (COZAAR ) 50 MG tablet Take 50 mg by mouth daily. 04/03/24   [provider]  metoprolol  tartrate (LOPRESSOR ) 25 MG tablet Take 0.5 tablets (12.5 mg total) by mouth 2 (two) times daily. Hold for SBP < 105 or HR < 60 05/06/24   Maczis, Michael M, PA-C  ondansetron  (ZOFRAN -ODT) 4 MG disintegrating tablet Take 4 mg by mouth every 8 (eight) hours as needed for nausea. 04/16/24   [provider]  oxyCODONE  (OXY IR/ROXICODONE ) 5 MG immediate release tablet Take 0.5-1 tablets (2.5-5 mg total) by mouth every 6 (six) hours as needed for breakthrough pain. 05/06/24   Maczis, Michael M, PA-C  oxymetazoline (AFRIN) 0.05 % nasal spray Place 2 sprays into both nostrils 2 (two) times daily as needed (nose bleed).    [provider]  polyethylene glycol (MIRALAX  / GLYCOLAX ) 17 g packet Take 17 g by mouth daily as needed for mild constipation. 05/06/24   Maczis, Michael M, PA-C  potassium chloride  SA (KLOR-CON  M) 20 MEQ tablet Take 20 mEq by mouth daily. 04/21/24  [provider]  senna (SENOKOT) 8.6 MG TABS tablet Take 2 tablets by mouth daily.    [provider]  vitamin B-12 (CYANOCOBALAMIN ) 500 MCG tablet Take 500 mcg by mouth every morning.    [provider]    Physical Exam: BP 134/70 (BP Location: Left Arm)   Pulse 90   Temp 99.3 F (37.4 C) (Oral)   Resp 14   SpO2 92%   General: 89 y.o. year-old female well developed well nourished in no acute distress.  Alert and oriented x3. Cardiovascular: Regular rate and rhythm with no rubs or gallops.  No thyromegaly or JVD noted.  No lower extremity edema. 2/4 pulses in all 4 extremities. Respiratory: Clear to auscultation with no wheezes or rales. Good inspiratory effort. Abdomen: Soft, lower abdominal tenderness to palpation.  Nondistended with normal bowel sounds x4 quadrants.  Bilateral flank tenderness to palpation. Muskuloskeletal: No cyanosis, clubbing or edema noted  bilaterally Neuro: CN II-XII intact, strength, sensation, reflexes Skin: No ulcerative lesions noted or rashes Psychiatry: Judgement and insight appear normal. Mood is appropriate for condition and setting          Labs on Admission:  Basic Metabolic Panel: Recent Labs  Lab 07/27/24 2021  NA 132*  K 5.1  CL 99  CO2 21*  GLUCOSE 106*  BUN 40*  CREATININE 1.98*  CALCIUM  9.0   Liver Function Tests: Recent Labs  Lab 07/27/24 2021  AST 24  ALT 8  ALKPHOS 93  BILITOT 0.5  PROT 6.6  ALBUMIN 3.3*   No results for input(s): LIPASE, AMYLASE in the last 168 hours. No results for input(s): AMMONIA in the last 168 hours. CBC: Recent Labs  Lab 07/27/24 2021  WBC 8.8  NEUTROABS 5.9  HGB 11.1*  HCT 34.1*  MCV 93.9  PLT 222   Cardiac Enzymes: No results for input(s): CKTOTAL, CKMB, CKMBINDEX, TROPONINI in the last 168 hours.  BNP (last 3 results) Recent Labs    10/12/23 0010 10/21/23 1603 11/19/23 1347  BNP 453.6* 485.9* 433.7*    ProBNP (last 3 results) No results for input(s): PROBNP in the last 8760 hours.  CBG: No results for input(s): GLUCAP in the last 168 hours.  Radiological Exams on Admission: CT Renal Stone Study Result Date: 07/27/2024 EXAM: CT ABDOMEN AND PELVIS WITHOUT CONTRAST 07/27/2024 08:42:19 PM TECHNIQUE: CT of the abdomen and pelvis was performed without the administration of intravenous contrast. Multiplanar reformatted images are provided for review. Automated exposure control, iterative reconstruction, and/or weight-based adjustment of the mA/kV was utilized to reduce the radiation dose to as low as reasonably achievable. COMPARISON: CT chest abdomen and pelvis 05/02/2024. CLINICAL HISTORY: Abdominal/flank pain, stone suspected. FINDINGS: LOWER CHEST: Cardiac enlargement. Small pleural effusions. Diffuse interstitial changes in the lung bases with bronchial wall thickening likely fibrosis and chronic bronchitis. LIVER: The  liver is unremarkable. GALLBLADDER AND BILE DUCTS: Gallbladder is unremarkable. No biliary ductal dilatation. SPLEEN: The spleen is unremarkable. PANCREAS: The pancreas is unremarkable. ADRENAL GLANDS: The adrenal glands are unremarkable. KIDNEYS, URETERS AND BLADDER: Prominent bilateral hydronephrosis and hydroureter, new since prior study. No obstructing stone or mass is identified. This likely represents reflux disease. The bladder wall is diffusely thickened with trabeculation and cellular formation and bilateral bladder diverticula. This is most likely to represent outlet obstruction or neurogenic bladder, although cystitis could also have this appearance. The bladder appears similar to the prior study. No discrete filling defects or bladder stones are identified. No perinephric or periureteral stranding. GI AND  BOWEL: Small esophageal hiatal hernia. The stomach, small bowel, and colon are not abnormally distended, and no wall thickening is appreciated. The appendix is normal. Diverticula in the sigmoid colon without evidence of acute diverticulitis. There is no bowel obstruction. PERITONEUM AND RETROPERITONEUM: No free air or free fluid in the abdomen. VASCULATURE: Aorta is normal in caliber. No calcification of the aorta. No aneurysm. LYMPH NODES: No lymphadenopathy. REPRODUCTIVE ORGANS: No acute abnormality. BONES AND SOFT TISSUES: Degenerative changes in the spine. Thoracolumbar scoliosis convex towards the left. Degenerative changes in the hips. Old ununited fracture deformity of the superior pubic ramus on the left with healed fracture deformity of the inferior pubic ramus. No focal soft tissue abnormality. IMPRESSION: 1. Prominent bilateral hydronephrosis and hydroureter, new since prior study, without an obstructing stone or mass identified, likely representing reflux disease. 2. Bladder wall thickening with trabeculation, cellule formation, and bilateral diverticula, similar to prior study, most likely  due to outlet obstruction or neurogenic bladder, although cystitis could also have this appearance. Electronically signed by: Elsie Gravely MD 07/27/2024 08:50 PM EST RP Workstation: [id number]    EKG: I independently viewed the EKG done and my findings are as followed: None available at the time of this visit.  Assessment/Plan Present on Admission:  AKI (acute kidney injury)  Principal Problem:   AKI (acute kidney injury)  AKI on CKD 3B, suspect prerenal in the setting of dehydration from poor oral intake. Creatinine at baseline 1.3 with GFR of 35 Presented with BUN of 40 and creatinine 1.98 Avoid nephrotoxic agents, dehydration, and hypotension Monitor urine output Repeat BMP in the morning.  Bilateral hydronephrosis, seen on CT scan CT renal stone study revealed prominent bilateral hydronephrosis and hydroureter new since prior study.  Without an obstructing stone or mass identified, likely representing reflux disease.  Follow-up bladder scan, ordered. May consider urology evaluation.  Mild non-anion gap metabolic acidosis Serum bicarb 21, anion gap 12. Continue IV fluid hydration Repeat BMP in the morning.  Mild hypovolemic hyponatremia Serum sodium 132 Encourage oral intake Continue IV fluid hydration  Chronic normocytic anemia No reported bleeding Hemoglobin 9.5 with MCV of 94. Continue to monitor H&H.   Time: 75 minutes.   DVT prophylaxis: Subcu Lovenox  daily.  Code Status: DNR.  Family Communication: None at bedside.  Disposition Plan: Admitted to telemetry unit.  Consults called: None.  Admission status: Inpatient status.   Status is: Inpatient The patient requires at least 2 midnights for further evaluation and treatment of present condition.   Terry LOISE Hurst MD [facility] Pager [phone]  If 7PM-7AM, please contact night-coverage [email or web address] Password [email or web address]  07/28/2024, 2:29 AM      [1] No Known Allergies

## 2024-07-29 DIAGNOSIS — R1084 Generalized abdominal pain: Secondary | ICD-10-CM

## 2024-07-29 DIAGNOSIS — R531 Weakness: Secondary | ICD-10-CM

## 2024-07-29 LAB — BASIC METABOLIC PANEL WITH GFR
Anion gap: 12 (ref 5–15)
BUN: 32 mg/dL — ABNORMAL HIGH (ref 8–23)
CO2: 17 mmol/L — ABNORMAL LOW (ref 22–32)
Calcium: 8.6 mg/dL — ABNORMAL LOW (ref 8.9–10.3)
Chloride: 103 mmol/L (ref 98–111)
Creatinine, Ser: 1.5 mg/dL — ABNORMAL HIGH (ref 0.44–1.00)
GFR, Estimated: 32 mL/min — ABNORMAL LOW (ref >60)
Glucose, Bld: 98 mg/dL (ref 70–99)
Potassium: 4.7 mmol/L (ref 3.5–5.1)
Sodium: 132 mmol/L — ABNORMAL LOW (ref 135–145)

## 2024-07-29 LAB — CBC
HCT: 31.3 % — ABNORMAL LOW (ref 36.0–46.0)
Hemoglobin: 9.9 g/dL — ABNORMAL LOW (ref 12.0–15.0)
MCH: 30.3 pg (ref 26.0–34.0)
MCHC: 31.6 g/dL (ref 30.0–36.0)
MCV: 95.7 fL (ref 80.0–100.0)
Platelets: 158 K/uL (ref 150–400)
RBC: 3.27 MIL/uL — ABNORMAL LOW (ref 3.87–5.11)
RDW: 13.7 % (ref 11.5–15.5)
WBC: 7.2 K/uL (ref 4.0–10.5)
nRBC: 0 % (ref 0.0–0.2)

## 2024-07-29 LAB — URINE CULTURE

## 2024-07-29 MED ORDER — CHLORHEXIDINE GLUCONATE CLOTH 2 % EX PADS
6.0000 | MEDICATED_PAD | Freq: Every day | CUTANEOUS | Status: DC
  Administered 2024-07-29 – 2024-08-02 (×4): 6 via TOPICAL

## 2024-07-29 MED ORDER — SODIUM BICARBONATE 650 MG PO TABS
650.0000 mg | ORAL_TABLET | Freq: Two times a day (BID) | ORAL | Status: AC
  Administered 2024-07-29 – 2024-07-31 (×6): 650 mg via ORAL
  Filled 2024-07-29 (×6): qty 1

## 2024-07-29 NOTE — Assessment & Plan Note (Signed)
 Possibly related to bladder dysfunction PT/OT have evaluated Based on her current level of function, she is recommended for STR in SNF Will complete FL2 and send out Will continue therapy through the weekend in case she demonstrates enough improvement to return to ALF on 12/22 (anticipate dc on that date)

## 2024-07-29 NOTE — Evaluation (Signed)
 Occupational Therapy Evaluation Patient Details Name: Ana Franco MRN: [id number] DOB: [birth year] Today's Date: 07/29/2024   History of Present Illness   Ana Franco is a 89 yr old female who presented to the hospital with abdominal pain. She was found to have a UTI and bilateral hydronephrosis. Medical history significant for permanent A-fib on Eliquis , CKD 3B, bladder wall trabeculation with multiple bladder diverticula, history of CVA, uses a walker at baseline, history of left below the knee amputation, history of ESBL E. COLI UTI 10/21/2023     Clinical Impressions The pt is currently presenting with the below listed deficits (see OT problem list). As such, her ADL performance is compromised and she requires assist for self-care management. During the session today, she required mod assist for supine to sit. She demonstrated intermittent posterior and lateral leaning seated EOB, requiring CGA and cues to correct. She further required max assist for lower body dressing (donning LLE prosthesis and R sock EOB), min assist x2 to stand using a RW, and mod assist to step-pivot to the chair using a RW. She will benefit from further OT services to maximize her safety and independence with ADLs and to decrease the risk for further weakness and deconditioning. Patient will benefit from continued inpatient follow up therapy, <3 hours/day      If plan is discharge home, recommend the following:   Direct supervision/assist for financial management;A lot of help with bathing/dressing/bathroom;A lot of help with walking and/or transfers;Direct supervision/assist for medications management     Functional Status Assessment   Patient has had a recent decline in their functional status and demonstrates the ability to make significant improvements in function in a reasonable and predictable amount of time.     Equipment Recommendations   Other (comment) (defer to next setting)      Recommendations for Other Services         Precautions/Restrictions   Precautions Precautions: Fall Precaution/Restrictions Comments: L BKA-prosthesis in room Restrictions Weight Bearing Restrictions Per Provider Order: No     Mobility Bed Mobility Overal bed mobility: Needs Assistance       Supine to sit: Mod assist          Transfers Overall transfer level: Needs assistance Equipment used: Rolling walker (2 wheels) Transfers: Sit to/from Stand, Bed to chair/wheelchair/BSC Sit to Stand: Min assist, +2 physical assistance     Step pivot transfers: Mod assist, +2 physical assistance     General transfer comment: pt requires constant support in sitting and standing for safety and balance, slow steps and frequent cues to reach the chair, unsafe to progress amb this session      Balance       Sitting balance - Comments: CGA with cues to correct lateral and posterior leaning     Standing balance-Leahy Scale: Poor              ADL either performed or assessed with clinical judgement   ADL Overall ADL's : Needs assistance/impaired Eating/Feeding: Set up;Sitting Eating/Feeding Details (indicate cue type and reason): chair level Grooming: Set up;Sitting Grooming Details (indicate cue type and reason): chair level Upper Body Bathing: Minimal assistance;Sitting Upper Body Bathing Details (indicate cue type and reason): chair level Lower Body Bathing: Moderate assistance;Sitting/lateral leans   Upper Body Dressing : Minimal assistance;Sitting Upper Body Dressing Details (indicate cue type and reason): chair level Lower Body Dressing: Maximal assistance;Sitting/lateral leans Lower Body Dressing Details (indicate cue type and reason): significant assist needed to donn R sock and LLE  prosthesis seated EOB Toilet Transfer: Moderate assistance;Stand-pivot;BSC/3in1;Rolling walker (2 wheels);Cueing for sequencing   Toileting- Clothing Manipulation and Hygiene:  Maximal assistance;Sit to/from stand;Cueing for compensatory techniques;Cueing for sequencing Toileting - Clothing Manipulation Details (indicate cue type and reason): at bedside commode level                          Pertinent Vitals/Pain Pain Assessment Pain Assessment: No/denies pain     Extremity/Trunk Assessment Upper Extremity Assessment Upper Extremity Assessment: LUE deficits/detail;Generalized weakness LUE Deficits / Details: Shoulder ROM not attempted, as the pt reported sustaining a fracture a month or so ago with associated soreness and difficulty performing shoulder flexion. Elbow and hand AROM WFL. Functional grip strength   Lower Extremity Assessment Lower Extremity Assessment: Generalized weakness LLE Deficits / Details: old L BKA      Communication Communication Communication: No apparent difficulties Factors Affecting Communication: Hearing impaired   Cognition Arousal: Alert Behavior During Therapy: WFL for tasks assessed/performed               OT - Cognition Comments: Oriented to person, place, and month. She reported the year to be 2026. She did not know why she was in the hospital.                 Following commands: Impaired Following commands impaired: Follows one step commands with increased time     Cueing  General Comments   Cueing Techniques: Verbal cues;Gestural cues              Home Living Family/patient expects to be discharged to:: Assisted living [facility])        Home Equipment: Wheelchair - manual   Additional Comments: staff assists with ADLs/medications/meals, son provides transportation      Prior Functioning/Environment Prior Level of Function : Needs assist        Mobility Comments:  (Prior to ~1 month ago when she had a fall, she ambulated short distances to and from the bathroom using a RW and her LLE prosthesis. She has been using a manual wheelchair over the past couple  weeks.) ADLs Comments:  (Pt required assist from the staff for dressing and bathing. Prior to ~1 month ago when she had a fall, she managed toileting tasks without needing assistance.)    OT Problem List: Decreased strength;Decreased activity tolerance;Impaired balance (sitting and/or standing);Decreased knowledge of use of DME or AE   OT Treatment/Interventions: Self-care/ADL training;Therapeutic exercise;Energy conservation;DME and/or AE instruction;Therapeutic activities;Balance training;Patient/family education      OT Goals(Current goals can be found in the care plan section)   Acute Rehab OT Goals OT Goal Formulation: With patient Time For Goal Achievement: 08/12/24 Potential to Achieve Goals: Good ADL Goals Pt Will Perform Upper Body Dressing: with set-up;sitting Pt Will Perform Lower Body Dressing: with min assist;with adaptive equipment;sitting/lateral leans;sit to/from stand Pt Will Transfer to Toilet: with supervision;stand pivot transfer;bedside commode Pt Will Perform Toileting - Clothing Manipulation and hygiene: with supervision;sit to/from stand Additional ADL Goal #1: The pt will perform bed mobility with SBA, in prep for progressive ADL participation.   OT Frequency:  Min 2X/week    Co-evaluation PT/OT/SLP Co-Evaluation/Treatment: Yes Reason for Co-Treatment: To address functional/ADL transfers PT goals addressed during session: Mobility/safety with mobility OT goals addressed during session: ADL's and self-care      AM-PAC OT 6 Clicks Daily Activity     Outcome Measure Help from another person eating meals?: A Little Help from another  person taking care of personal grooming?: A Little Help from another person toileting, which includes using toliet, bedpan, or urinal?: A Lot Help from another person bathing (including washing, rinsing, drying)?: A Lot Help from another person to put on and taking off regular upper body clothing?: A Little Help from another  person to put on and taking off regular lower body clothing?: A Lot 6 Click Score: 15   End of Session Equipment Utilized During Treatment: Gait belt;Rolling walker (2 wheels) Nurse Communication: Mobility status  Activity Tolerance: Patient tolerated treatment well Patient left: in chair;with call bell/phone within reach  OT Visit Diagnosis: Unsteadiness on feet (R26.81);Other abnormalities of gait and mobility (R26.89);Muscle weakness (generalized) (M62.81)                Time: 8776-8746 OT Time Calculation (min): 30 min Charges:  OT General Charges $OT Visit: 1 Visit OT Evaluation $OT Eval Moderate Complexity: 1 Mod    Ana Franco, OTR/L 07/29/2024, 4:21 PM

## 2024-07-29 NOTE — Assessment & Plan Note (Deleted)
 Normocytic anemia Appears to be stable

## 2024-07-29 NOTE — Assessment & Plan Note (Signed)
 Stump appears well healed Consider 81 mg ASA daily if not resuming AC

## 2024-07-29 NOTE — Assessment & Plan Note (Signed)
 Baseline creatinine 1.4-1.6 with GFR 30-35 Presented with creatinine 1.98, GFR 23 Improved back to baseline Avoid nephrotoxic agents, dehydration, and hypotension Mild non-anion gap metabolic acidosis; will add bicarb for 3 days and recommend repeat labs on 12/21

## 2024-07-29 NOTE — Assessment & Plan Note (Signed)
 Resume metoprolol  with hold parameters Hold losartan  for now, as BP is controlled without (132/66 currently) BP goal is < 160 based on age

## 2024-07-29 NOTE — Assessment & Plan Note (Deleted)
 Presented with reported abdominal and flank pain No current symptoms UA is somewhat abnormal H/o ESBL UTI so placed on contact precautions Urine culture with multiple organisms, no specific organism She does not appear to need ongoing antibiotics

## 2024-07-29 NOTE — Progress Notes (Signed)
 Progress Note   Patient: Ana Franco FMW:[id number] DOB: 03/07/1926 DOA: 07/27/2024     1 DOS: the patient was seen and examined on 07/29/2024   Brief hospital course: 89yo with h/o afib on Eliquis , stage 3b CKD, CVA, PVD s/p L BKA, and ESSBL UTI (10/2023) who presented on 12/16 with abdominal and B flank pain. +UTI. CT with B hydronephrosis and hydroureter thought to be related to reflux disease. She was started on meropenem  but appears to be contaminant rather than infection so antibiotics have been stopped.    Assessment & Plan Abdominal pain Presented with reported abdominal and flank pain No current symptoms UA is somewhat abnormal H/o ESBL UTI so placed on contact precautions Urine culture with multiple organisms, no specific organism She does not appear to need ongoing antibiotics Bilateral hydronephrosis CT renal stone study revealed prominent bilateral hydronephrosis and hydroureter new since prior study Without an obstructing stone or mass identified, likely representing reflux disease vs. cystitis Discussed with urology, Ole Bourdon (who reviewed with Dr. Devere) Likely chronic in nature Recommend placement of foley and then repeat renal US  in 48 hours If improved, will leave foley in place and follow up outpatient with urology AKI (acute kidney injury) Baseline creatinine 1.4-1.6 with GFR 30-35 Presented with creatinine 1.98, GFR 23 Improved back to baseline Avoid nephrotoxic agents, dehydration, and hypotension Mild non-anion gap metabolic acidosis; will add bicarb for 3 days and recommend repeat labs on 12/21 Generalized weakness Possibly related to bladder dysfunction PT/OT have evaluated Based on her current level of function, she is recommended for STR in SNF Will complete FL2 and send out Will continue therapy through the weekend in case she demonstrates enough improvement to return to ALF on 12/22 (anticipate dc on that date) Permanent atrial  fibrillation (HCC) No longer on Eliquis  Rate controlled with metoprolol  She was supposed to f/u with cardiology  Hx of BKA, left (HCC) H/O: CVA (cerebrovascular accident) Stump appears well healed Consider 81 mg ASA daily if not resuming AC HLD (hyperlipidemia) Hold Vytorin  given limited inpatient utility Essential hypertension Resume metoprolol  with hold parameters Hold losartan  for now, as BP is controlled without (132/66 currently) BP goal is < 160 based on age Anemia of chronic disease Normocytic anemia Appears to be stable Hyponatremia Chronic, stable DNR (do not resuscitate) DNR confirmed at the time of admission Patient will need a gold out of facility DNR form at the time of discharge       Consultants: PT OT Inpatient case management   Procedures: None   Antibiotics: Meropenem  12/17-18  30 Day Unplanned Readmission Risk Score    Flowsheet Row ED to Hosp-Admission (Current) from 07/27/2024 in [facility]  30 Day Unplanned Readmission Risk Score (%) 32.32 Filed at 07/29/2024 1200    This score is the patient's risk of an unplanned readmission within 30 days of being discharged (0 -100%). The score is based on dignosis, age, lab data, medications, orders, and past utilization.   Low:  0-14.9   Medium: 15-21.9   High: 22-29.9   Extreme: 30 and above           Subjective: Feeling better, no specific concerns today. Much more alert and interactive. Reports that abdominal pain is improved with Tylenol .    Objective: Vitals:   07/29/24 0532 07/29/24 1351  BP: 132/66 (!) 140/54  Pulse: 74 73  Resp: 18 16  Temp: 98.2 F (36.8 C) 98.1 F (36.7 C)  SpO2: 93% 95%  Intake/Output Summary (Last 24 hours) at 07/29/2024 1448 Last data filed at 07/29/2024 1351 Gross per 24 hour  Intake 1539.75 ml  Output 900 ml  Net 639.75 ml   Filed Weights   07/28/24 1233 07/29/24 0500  Weight: 73.2 kg 74.8 kg    Exam:  General:  Appears calm  and comfortable and is in NAD Eyes:  normal lids, iris ENT:  grossly normal hearing, lips & tongue, mmm Cardiovascular:  RRR, no m/r/g. No LE edema.  Respiratory:   CTA bilaterally with no wheezes/rales/rhonchi.  Normal respiratory effort. Abdomen:  soft, NT, ND Skin:  no rash or induration seen on limited exam Musculoskeletal:  grossly normal tone BUE/BLE, good ROM, no bony abnormality Psychiatric:  grossly normal mood and affect, speech fluent and appropriate, AOx3 Neurologic:  CN 2-12 grossly intact, moves all extremities in coordinated fashion  Data Reviewed: I have reviewed the patient's lab results since admission.  Pertinent labs for today include:   Na++ 132, stable CO2 17 BUN 32/Creatinine 1.50/GFR 32, stable WBC 7.2 Hgb 9.9 Urine culture with multiple species     Family Communication: None present  Mobility: PT/OT Consulted and are recommending - Skilled Nursing-Short Term Rehab (<3 Hours/Day)07/29/2024 1322    Code Status: Limited: Do not attempt resuscitation (DNR) -DNR-LIMITED -Do Not Intubate/DNI   Barriers to discharge:  Placing folley, repeat bladder scan on 12/21, anticipate dc on 12/22  Disposition: Status is: Inpatient Remains inpatient appropriate because: ongoing management     Time spent: 50 minutes  Unresulted Labs (From admission, onward)     Start     Ordered   08/01/24 0500  CBC  Tomorrow morning,   R       Question:  Specimen collection method  Answer:  Lab=Lab collect   07/29/24 1444   08/01/24 0500  Basic metabolic panel with GFR  Tomorrow morning,   R       Question:  Specimen collection method  Answer:  Lab=Lab collect   07/29/24 1444             Author: Delon Herald, MD 07/29/2024 2:48 PM  For on call review [email or web address].

## 2024-07-29 NOTE — Assessment & Plan Note (Deleted)
 Chronic, stable

## 2024-07-29 NOTE — Assessment & Plan Note (Signed)
 Chronic, stable

## 2024-07-29 NOTE — Assessment & Plan Note (Deleted)
 Hold Vytorin  given limited inpatient utility

## 2024-07-29 NOTE — Assessment & Plan Note (Signed)
 DNR confirmed at the time of admission Patient will need a gold out of facility DNR form at the time of discharge

## 2024-07-29 NOTE — TOC Initial Note (Signed)
 Transition of Care [facility]) - Initial/Assessment Note    Patient Details  Name: Ana Franco MRN: [id number] Date of Birth: 12/01/1925  [facility] CM/SW Contact:    Toy LITTIE Agar, RN Phone Number:[phone]  07/29/2024, 2:44 PM  Clinical Narrative:                 Inpatient care manager acknowledges consult for disposition planning. Patient is from [facility]. CM verified with Kristin in admissions. Patient has new recommendation for SNF for short term rehab. CM has reached out to [facility] to determine if rehab beds are available at this facility. Per [facility] facility currently has no SNF beds available. Patient can pick up some therapy days under her medicare part B but it wont be a skilled stay. CM to follow up with therapy.   Expected Discharge Plan: Skilled Nursing Facility Barriers to Discharge: No Barriers Identified, Continued Medical Work up   Patient Goals and CMS Choice Patient states their goals for this hospitalization and ongoing recovery are:: Wants to go back to ALF CMS Medicare.gov Compare Post Acute Care list provided to:: Patient Choice offered to / list presented to : Patient [place] ownership interest in [facility].provided to:: Patient    Expected Discharge Plan and Services In-house Referral: NA Discharge Planning Services: CM Consult Post Acute Care Choice: Skilled Nursing Facility Living arrangements for the past 2 months: Assisted Living Facility                 DME Arranged: N/A DME Agency: NA       HH Arranged: NA HH Agency: NA        Prior Living Arrangements/Services Living arrangements for the past 2 months: Assisted Living Facility Lives with:: Roommate Patient language and need for interpreter reviewed:: Yes Do you feel safe going back to the place where you live?: Yes      Need for Family Participation in Patient Care: Yes (Comment) Care giver support system in place?: Yes (comment)   Criminal  Activity/Legal Involvement Pertinent to Current Situation/Hospitalization: No - Comment as needed  Activities of Daily Living   ADL Screening (condition at time of admission) Independently performs ADLs?: No Does the patient have a NEW difficulty with bathing/dressing/toileting/self-feeding that is expected to last >3 days?: Yes (Initiates electronic notice to provider for possible OT consult) Does the patient have a NEW difficulty with getting in/out of bed, walking, or climbing stairs that is expected to last >3 days?: Yes (Initiates electronic notice to provider for possible PT consult) Does the patient have a NEW difficulty with communication that is expected to last >3 days?: No Is the patient deaf or have difficulty hearing?: No Does the patient have difficulty seeing, even when wearing glasses/contacts?: No Does the patient have difficulty concentrating, remembering, or making decisions?: No  Permission Sought/Granted Permission sought to share information with : Family Supports Permission granted to share information with : No              Emotional Assessment Appearance:: Appears stated age Attitude/Demeanor/Rapport: Gracious Affect (typically observed): Pleasant Orientation: : Oriented to Self, Oriented to Place, Oriented to Situation Alcohol / Substance Use: Not Applicable Psych Involvement: No (comment)  Admission diagnosis:  AKI (acute kidney injury) [N17.9] Patient Active Problem List   Diagnosis Date Noted   AKI (acute kidney injury) 07/28/2024   Bilateral hydronephrosis 07/28/2024   Closed pelvic ring fracture (HCC) 05/03/2024   Trauma 05/02/2024   Sepsis (HCC) 10/21/2023   Hematemesis  10/21/2023   Hyperkalemia 10/21/2023   Acute on chronic HFrEF (heart failure with reduced ejection fraction) (HCC) 10/12/2023   Acute CHF (congestive heart failure) (HCC) 10/12/2023   ARF (acute renal failure) 10/05/2023   SIRS (systemic inflammatory response syndrome) (HCC)  10/04/2023   Anemia of chronic disease 10/04/2023   COVID-19 virus antibody detected 10/04/2023   Acute kidney injury superimposed on stage 3a chronic kidney disease (HCC) 10/03/2023   Hypokalemia 12/15/2022   Acute diastolic CHF (congestive heart failure) (HCC) 12/14/2022   Hyponatremia 12/14/2022   Normocytic anemia 12/14/2022   Coronary artery disease 12/14/2022   Hx of BKA, left (HCC) 12/14/2022   Permanent atrial fibrillation (HCC) 12/18/2021   H/O: CVA (cerebrovascular accident) 12/18/2021   History of non-ST elevation myocardial infarction (NSTEMI) 12/18/2021   Acute CVA (cerebrovascular accident) (HCC) 01/07/2021   Thrombocytopenia 01/06/2021   TIA (transient ischemic attack) 01/06/2021   DNR (do not resuscitate) 01/06/2021   Chronic kidney disease CKD IIIb 01/06/2021   Paroxysmal atrial fibrillation (HCC) 05/04/2018   Gangrene of lower extremity (HCC)    Subacute osteomyelitis, left ankle and foot (HCC)    S/P ORIF (open reduction internal fixation) fracture 04/03/2018   Wound infection 04/02/2018   MRSA (methicillin resistant staph aureus) culture positive 04/02/2018   Acute blood loss anemia 04/02/2018   Closed left ankle fracture, with delayed healing, subsequent encounter 03/31/2018   Essential hypertension 06/23/2014   HLD (hyperlipidemia) 06/23/2014   GERD (gastroesophageal reflux disease) 06/23/2014   Abdominal pain 06/23/2014   Depression 06/23/2014   PCP:  Pcp, No Pharmacy:   [facility] [address] [place] [address] Phone: [phone] Fax: [phone]  [facility] - [place], [place] - [address] [place] [address] Phone: [phone] Fax: [phone]     Social Drivers of Health (SDOH) Social History: SDOH Screenings   Food Insecurity: No Food Insecurity (07/28/2024)  Housing: Low Risk (07/28/2024)  Transportation Needs: Unmet Transportation Needs (07/28/2024)   Utilities: Not At Risk (07/28/2024)  Social Connections: Moderately Integrated (07/28/2024)  Recent Concern: Social Connections - Moderately Isolated (07/28/2024)  Tobacco Use: Low Risk (07/27/2024)   SDOH Interventions:     Readmission Risk Interventions    07/29/2024    2:41 PM 05/03/2024    9:44 AM  Readmission Risk Prevention Plan  Transportation Screening Complete Complete  PCP or Specialist Appt within 3-5 Days  Complete  HRI or Home Care Consult  Complete  Social Work Consult for Recovery Care Planning/Counseling  Complete  Palliative Care Screening  Not Applicable  Medication Review Oceanographer) Complete Complete  PCP or Specialist appointment within 3-5 days of discharge Complete   HRI or Home Care Consult Complete   SW Recovery Care/Counseling Consult Complete   Palliative Care Screening Not Applicable   Skilled Nursing Facility Complete

## 2024-07-29 NOTE — Assessment & Plan Note (Signed)
 Normocytic anemia Appears to be stable

## 2024-07-29 NOTE — Assessment & Plan Note (Deleted)
 Resume metoprolol  with hold parameters Hold losartan  for now, as BP is controlled without (132/66 currently) BP goal is < 160 based on age

## 2024-07-29 NOTE — Assessment & Plan Note (Deleted)
 No longer on Eliquis  Rate controlled with metoprolol  She was supposed to f/u with cardiology

## 2024-07-29 NOTE — Assessment & Plan Note (Signed)
 Hold Vytorin  given limited inpatient utility

## 2024-07-29 NOTE — Plan of Care (Signed)
  Problem: Clinical Measurements: Goal: Respiratory complications will improve Outcome: Progressing Goal: Cardiovascular complication will be avoided Outcome: Progressing   Problem: Nutrition: Goal: Adequate nutrition will be maintained Outcome: Progressing   Problem: Pain Managment: Goal: General experience of comfort will improve and/or be controlled Outcome: Progressing

## 2024-07-29 NOTE — Hospital Course (Addendum)
 89yo with h/o afib on Eliquis , stage 3b CKD, CVA, PVD s/p L BKA, and ESSBL UTI (10/2023) who presented on 12/16 with abdominal and B flank pain. +UTI. CT with B hydronephrosis and hydroureter thought to be related to reflux disease. She was started on meropenem  but appears to be contaminant rather than infection so antibiotics have been stopped.

## 2024-07-29 NOTE — Assessment & Plan Note (Deleted)
 CT renal stone study revealed prominent bilateral hydronephrosis and hydroureter new since prior study Without an obstructing stone or mass identified, likely representing reflux disease vs. cystitis Discussed with urology, Ole Bourdon Recommend placement of foley and then repeat renal US  in 48 hours If improved, will leave foley in place and follow up outpatient with urology

## 2024-07-29 NOTE — Progress Notes (Signed)
 Physical Therapy Treatment Patient Details Name: Ana Franco MRN: [id number] DOB: 14-Jan-1926 Today's Date: 07/29/2024   History of Present Illness Ana Franco is a 89 y.o. female admitted 07/27/24 with UTI with medical history significant for permanent A-fib on Eliquis , CKD 3B, bladder wall trabeculation with multiple bladder diverticula, history of CVA, uses a walker at baseline, history of left below the knee amputation, history of ESBL E. COLI UTI 10/21/2023, who presents to the ER due to lower abdominal pain and bilateral flank pain.    PT Comments  Pt in recliner and ready to transfer back to bed for foley placement. Pt requires min A x2 for Sit to Stand and continued cues for sequencing during transfer to the bed. Pt mod A x2 for step pivot transfer with heavy cueing for sequencing LE and walker position. She is able to identify when bed is behind her before sitting. Sitting balance improves this session to SBA and she is able to complete sit to supine with SBA, requires multiple attempts for RLE elevation onto the bed. Pt requires 2 assist for scooting up in bed.     If plan is discharge home, recommend the following: Two people to help with walking and/or transfers;Two people to help with bathing/dressing/bathroom;Assistance with cooking/housework;Assist for transportation   Can travel by private vehicle     No  Equipment Recommendations  None recommended by PT    Recommendations for Other Services       Precautions / Restrictions Precautions Precautions: Fall Recall of Precautions/Restrictions: Intact Precaution/Restrictions Comments: L BKA-prosthesis in room Restrictions Weight Bearing Restrictions Per Provider Order: No     Mobility  Bed Mobility Overal bed mobility: Needs Assistance Bed Mobility: Sit to Supine     Supine to sit: Mod assist Sit to supine: Supervision   General bed mobility comments: pt able to attain supine position, unable to scoot up in bed  without assist    Transfers Overall transfer level: Needs assistance Equipment used: Rolling walker (2 wheels) Transfers: Sit to/from Stand, Bed to chair/wheelchair/BSC Sit to Stand: Min assist, +2 physical assistance   Step pivot transfers: Mod assist, +2 physical assistance       General transfer comment: pt requires constant support in sitting and standing for safety and balance, slow steps and frequent cues to reach the chair, unsafe to progress amb this session    Ambulation/Gait                   Stairs             Wheelchair Mobility     Tilt Bed    Modified Rankin (Stroke Patients Only)       Balance Overall balance assessment: Needs assistance Sitting-balance support: Bilateral upper extremity supported, Feet supported Sitting balance-Leahy Scale: Fair Sitting balance - Comments: improved sitting balance this session sitting EOB static   Standing balance support: During functional activity, Reliant on assistive device for balance Standing balance-Leahy Scale: Poor Standing balance comment: static standing balance requires min A x1 and RW                            Communication Communication Communication: No apparent difficulties Factors Affecting Communication: Hearing impaired  Cognition Arousal: Alert Behavior During Therapy: WFL for tasks assessed/performed   PT - Cognitive impairments: Sequencing  Following commands: Impaired Following commands impaired: Follows one step commands with increased time    Cueing Cueing Techniques: Verbal cues, Gestural cues  Exercises      General Comments        Pertinent Vitals/Pain Pain Assessment Pain Assessment: No/denies pain    Home Living Family/patient expects to be discharged to:: Assisted living                 Home Equipment: Rolling Walker (2 wheels);Wheelchair - manual;Shower seat;Grab bars - toilet;Grab bars -  tub/shower;Hand held shower head Additional Comments: staff assists with ADLs/medications/meals, son provides transportation    Prior Function            PT Goals (current goals can now be found in the care plan section) Acute Rehab PT Goals Patient Stated Goal: return to ALF, improve strength PT Goal Formulation: With patient Time For Goal Achievement: 08/12/24 Potential to Achieve Goals: Fair Progress towards PT goals: Progressing toward goals    Frequency    Min 4X/week      PT Plan      Co-evaluation              AM-PAC PT 6 Clicks Mobility   Outcome Measure  Help needed turning from your back to your side while in a flat bed without using bedrails?: A Little Help needed moving from lying on your back to sitting on the side of a flat bed without using bedrails?: A Lot Help needed moving to and from a bed to a chair (including a wheelchair)?: A Lot Help needed standing up from a chair using your arms (e.g., wheelchair or bedside chair)?: A Lot Help needed to walk in hospital room?: Total Help needed climbing 3-5 steps with a railing? : Total 6 Click Score: 11    End of Session Equipment Utilized During Treatment: Gait belt Activity Tolerance: Patient tolerated treatment well Patient left: in bed;with nursing/sitter in room;with call bell/phone within reach Nurse Communication: Mobility status PT Visit Diagnosis: Unsteadiness on feet (R26.81);Muscle weakness (generalized) (M62.81);Difficulty in walking, not elsewhere classified (R26.2)     Time: 8555-8540 PT Time Calculation (min) (ACUTE ONLY): 15 min  Charges:    $Therapeutic Activity: 8-22 mins PT General Charges $$ ACUTE PT VISIT: 1 Visit                     Stann, PT Acute Rehabilitation Services Office: [phone] 07/29/2024    Stann DELENA Ohara 07/29/2024, 3:40 PM

## 2024-07-29 NOTE — Progress Notes (Signed)
 No acute changes this shift, medicated once for nausea, tolerated meds well, no ASE noted, #16 fr foley catheter placed, in bed rusting, denies pain, call light in reach

## 2024-07-29 NOTE — Assessment & Plan Note (Deleted)
 Stump appears well healed Consider 81 mg ASA daily if not resuming AC

## 2024-07-29 NOTE — Assessment & Plan Note (Deleted)
 Baseline creatinine 1.4-1.6 with GFR 30-35 Presented with creatinine 1.98, GFR 23 Improved back to baseline Avoid nephrotoxic agents, dehydration, and hypotension Mild non-anion gap metabolic acidosis; will add bicarb for 3 days and recommend repeat labs early next week at ALF

## 2024-07-29 NOTE — Assessment & Plan Note (Signed)
 Presented with reported abdominal and flank pain No current symptoms UA is somewhat abnormal H/o ESBL UTI so placed on contact precautions Urine culture with multiple organisms, no specific organism She does not appear to need ongoing antibiotics

## 2024-07-29 NOTE — Assessment & Plan Note (Signed)
 No longer on Eliquis  Rate controlled with metoprolol  She was supposed to f/u with cardiology

## 2024-07-29 NOTE — NC FL2 (Signed)
 [place]  MEDICAID FL2 LEVEL OF CARE FORM     IDENTIFICATION  Patient Name: Ana Franco Birthdate: 20-Aug-1925 Sex: female Admission Date (Current Location): 07/27/2024  [facility] [place] and [place] Number:  Pro[employer], Television/film/video and Address:  [facility],  [address], [place] [address]      Provider Number: [id number]  Attending Physician Name and Address:  Barbarann Nest, MD  Relative Name and Phone Number:  Paulette Rockford [phone]    Current Level of Care: SNF Recommended Level of Care: Assisted Living Facility Prior Approval Number:    Date Approved/Denied:   PASRR Number: [id number]  Discharge Plan: Other (Comment) (Assisted living facility)    Current Diagnoses: Patient Active Problem List   Diagnosis Date Noted   AKI (acute kidney injury) 07/28/2024   Bilateral hydronephrosis 07/28/2024   Closed pelvic ring fracture (HCC) 05/03/2024   Trauma 05/02/2024   Sepsis (HCC) 10/21/2023   Hematemesis 10/21/2023   Hyperkalemia 10/21/2023   Acute on chronic HFrEF (heart failure with reduced ejection fraction) (HCC) 10/12/2023   Acute CHF (congestive heart failure) (HCC) 10/12/2023   ARF (acute renal failure) 10/05/2023   SIRS (systemic inflammatory response syndrome) (HCC) 10/04/2023   Anemia of chronic disease 10/04/2023   COVID-19 virus antibody detected 10/04/2023   Acute kidney injury superimposed on stage 3a chronic kidney disease (HCC) 10/03/2023   Hypokalemia 12/15/2022   Acute diastolic CHF (congestive heart failure) (HCC) 12/14/2022   Hyponatremia 12/14/2022   Normocytic anemia 12/14/2022   Coronary artery disease 12/14/2022   Hx of BKA, left (HCC) 12/14/2022   Permanent atrial fibrillation (HCC) 12/18/2021   H/O: CVA (cerebrovascular accident) 12/18/2021   History of non-ST elevation myocardial infarction (NSTEMI) 12/18/2021   Acute CVA (cerebrovascular accident) (HCC) 01/07/2021   Thrombocytopenia 01/06/2021   TIA (transient ischemic  attack) 01/06/2021   DNR (do not resuscitate) 01/06/2021   Chronic kidney disease CKD IIIb 01/06/2021   Paroxysmal atrial fibrillation (HCC) 05/04/2018   Gangrene of lower extremity (HCC)    Subacute osteomyelitis, left ankle and foot (HCC)    S/P ORIF (open reduction internal fixation) fracture 04/03/2018   Wound infection 04/02/2018   MRSA (methicillin resistant staph aureus) culture positive 04/02/2018   Acute blood loss anemia 04/02/2018   Closed left ankle fracture, with delayed healing, subsequent encounter 03/31/2018   Essential hypertension 06/23/2014   HLD (hyperlipidemia) 06/23/2014   GERD (gastroesophageal reflux disease) 06/23/2014   Abdominal pain 06/23/2014   Depression 06/23/2014    Orientation RESPIRATION BLADDER Height & Weight     Self, Situation, Place  Normal Incontinent Weight: 74.8 kg Height:  5' 8 (172.7 cm)  BEHAVIORAL SYMPTOMS/MOOD NEUROLOGICAL BOWEL NUTRITION STATUS     (n/a) Continent Diet (Heart healthy)  AMBULATORY STATUS COMMUNICATION OF NEEDS Skin   Limited Assist Verbally Other (Comment) (redness noted to sacrum)                       Personal Care Assistance Level of Assistance  Bathing, Feeding, Dressing, Total care Bathing Assistance: Limited assistance Feeding assistance: Independent (set up only) Dressing Assistance: Limited assistance Total Care Assistance: Limited assistance   Functional Limitations Info  Sight, Hearing, Speech Sight Info: Adequate Hearing Info: Adequate Speech Info: Adequate    SPECIAL CARE FACTORS FREQUENCY                       Contractures Contractures Info: Not present (patient does have prosthesis)    Additional Factors Info  Code Status, Allergies, Psychotropic, Insulin Sliding Scale, Isolation Precautions, Suctioning Needs Code Status Info: DNR Allergies Info: No known drug allergies Psychotropic Info: see d/c summary Insulin Sliding Scale Info: see d/c sumary Isolation Precautions Info:  Contact precautions (ESBL, UTI) Suctioning Needs: n/a   Current Medications (07/29/2024):  This is the current hospital active medication list Current Facility-Administered Medications  Medication Dose Route Frequency Provider Last Rate Last Admin   acetaminophen  (TYLENOL ) tablet 500 mg  500 mg Oral Q6H PRN Shona Laurence N, DO       citalopram  (CELEXA ) tablet 10 mg  10 mg Oral Daily Barbarann Nest, MD   10 mg at 07/29/24 1034   enoxaparin  (LOVENOX ) injection 30 mg  30 mg Subcutaneous Q24H Shona Laurence N, DO   30 mg at 07/29/24 1036   gabapentin  (NEURONTIN ) capsule 100 mg  100 mg Oral TID Barbarann Nest, MD   100 mg at 07/29/24 1034   meropenem  (MERREM ) 500 mg in sodium chloride  0.9 % 100 mL IVPB  500 mg Intravenous Q12H Shona Laurence N, DO 200 mL/hr at 07/29/24 0311 500 mg at 07/29/24 9688   metoprolol  tartrate (LOPRESSOR ) tablet 12.5 mg  12.5 mg Oral BID Barbarann Nest, MD   12.5 mg at 07/29/24 1034   Oral care mouth rinse  15 mL Mouth Rinse PRN Barbarann Nest, MD       pantoprazole  (PROTONIX ) EC tablet 40 mg  40 mg Oral Daily Barbarann Nest, MD   40 mg at 07/29/24 1034   prochlorperazine  (COMPAZINE ) injection 5 mg  5 mg Intravenous Q6H PRN Shona Laurence SAILOR, DO       senna (SENOKOT) tablet 17.2 mg  2 tablet Oral Daily Barbarann Nest, MD   17.2 mg at 07/29/24 1034     Discharge Medications: Please see discharge summary for a list of discharge medications.  Relevant Imaging Results:  Relevant Lab Results:   Additional Information SS #: [id number]  Toy LITTIE Agar, RN

## 2024-07-29 NOTE — Evaluation (Signed)
 Physical Therapy Evaluation Patient Details Name: Ana Franco MRN: [id number] DOB: 05-Mar-1926 Today's Date: 07/29/2024  History of Present Illness  Kerianne Gurr is a 89 y.o. female admitted 07/27/24 with UTI with medical history significant for permanent A-fib on Eliquis , CKD 3B, bladder wall trabeculation with multiple bladder diverticula, history of CVA, uses a walker at baseline, history of left below the knee amputation, history of ESBL E. COLI UTI 10/21/2023, who presents to the ER due to lower abdominal pain and bilateral flank pain.  Clinical Impression  Pt admitted with above diagnosis. Pt in bed, agreeable to PT session. Pt requires mod A to come to sitting edge of bed due to dec strength and difficulty scooting. Pt has posterior lean in sitting, able to correct with cues and CGA, but does not maintain corrected position. Min A x2 for standing into RW, and min A x1 provided for static standing balance. Pt completes step pivot to the recliner with mod A x2 and cues for sequencing and walker positioning. Limited safety awareness completing stand to sit transfer and sits off center in the chair, assisted with positioning. Pt lunch arrives at the end of the session. Based on pt PLOF, level of support, and current functional status, pt will benefit from skilled inpatient physical therapy <3 hours per day at dc for safe progression of functional mobility.  Pt currently with functional limitations due to the deficits listed below (see PT Problem List). Pt will benefit from acute skilled PT to increase their independence and safety with mobility to allow discharge.           If plan is discharge home, recommend the following: Two people to help with walking and/or transfers;Two people to help with bathing/dressing/bathroom;Assistance with cooking/housework;Assist for transportation   Can travel by private vehicle   No    Equipment Recommendations None recommended by PT  Recommendations for  Other Services       Functional Status Assessment Patient has had a recent decline in their functional status and demonstrates the ability to make significant improvements in function in a reasonable and predictable amount of time.     Precautions / Restrictions Precautions Precautions: Fall Recall of Precautions/Restrictions: Intact Precaution/Restrictions Comments: L BKA Restrictions Weight Bearing Restrictions Per Provider Order: No      Mobility  Bed Mobility Overal bed mobility: Needs Assistance Bed Mobility: Supine to Sit     Supine to sit: Mod assist     General bed mobility comments: trunk control and scooting EOB    Transfers Overall transfer level: Needs assistance Equipment used: Rolling walker (2 wheels) Transfers: Sit to/from Stand, Bed to chair/wheelchair/BSC Sit to Stand: Min assist, +2 physical assistance   Step pivot transfers: Mod assist, +2 physical assistance       General transfer comment: pt requires constant support in sitting and standing for safety and balance, slow steps and frequent cues to reach the chair, unsafe to progress amb this day    Ambulation/Gait                  Stairs            Wheelchair Mobility     Tilt Bed    Modified Rankin (Stroke Patients Only)       Balance Overall balance assessment: Needs assistance Sitting-balance support: Bilateral upper extremity supported, Feet supported Sitting balance-Leahy Scale: Poor Sitting balance - Comments: CGA to maintain balance sitting EOB   Standing balance support: During functional activity, Reliant on assistive device  for balance Standing balance-Leahy Scale: Poor Standing balance comment: static standing balance requires min A x1 and RW                             Pertinent Vitals/Pain Pain Assessment Pain Assessment: No/denies pain    Home Living Family/patient expects to be discharged to:: Assisted living                 Home  Equipment: Agricultural Consultant (2 wheels);Wheelchair - manual;Shower seat;Grab bars - toilet;Grab bars - tub/shower;Hand held shower head Additional Comments: staff assists with ADLs/medications/meals, son provides transportation    Prior Function Prior Level of Function : Needs assist             Mobility Comments: ambulating short distances to/from bathroom with RW and LLE prosthetic and assist, uses lift chair to stand ADLs Comments: does not go to dining hall, typically eats in room (meals provided). staff assists with bathing on shower seat 2x/wk, LB dressing daily. pt reports assist with toileting     Extremity/Trunk Assessment   Upper Extremity Assessment Upper Extremity Assessment: Defer to OT evaluation    Lower Extremity Assessment Lower Extremity Assessment: Generalized weakness    Cervical / Trunk Assessment Cervical / Trunk Assessment: Kyphotic  Communication   Communication Communication: No apparent difficulties Factors Affecting Communication: Hearing impaired    Cognition Arousal: Alert Behavior During Therapy: WFL for tasks assessed/performed   PT - Cognitive impairments: No apparent impairments                         Following commands: Intact       Cueing Cueing Techniques: Verbal cues, Gestural cues     General Comments      Exercises     Assessment/Plan    PT Assessment Patient needs continued PT services  PT Problem List Decreased strength;Decreased mobility;Decreased safety awareness;Decreased activity tolerance;Decreased balance       PT Treatment Interventions DME instruction;Therapeutic exercise;Gait training;Balance training;Neuromuscular re-education;Functional mobility training;Therapeutic activities;Patient/family education    PT Goals (Current goals can be found in the Care Plan section)  Acute Rehab PT Goals Patient Stated Goal: return to ALF, improve strength PT Goal Formulation: With patient Time For Goal  Achievement: 08/12/24 Potential to Achieve Goals: Good    Frequency Min 4X/week     Co-evaluation               AM-PAC PT 6 Clicks Mobility  Outcome Measure Help needed turning from your back to your side while in a flat bed without using bedrails?: A Little Help needed moving from lying on your back to sitting on the side of a flat bed without using bedrails?: A Lot Help needed moving to and from a bed to a chair (including a wheelchair)?: A Lot Help needed standing up from a chair using your arms (e.g., wheelchair or bedside chair)?: A Lot Help needed to walk in hospital room?: Total Help needed climbing 3-5 steps with a railing? : Total 6 Click Score: 11    End of Session Equipment Utilized During Treatment: Gait belt Activity Tolerance: Patient tolerated treatment well Patient left: in chair;with call bell/phone within reach;with chair alarm set Nurse Communication: Mobility status PT Visit Diagnosis: Unsteadiness on feet (R26.81);Muscle weakness (generalized) (M62.81);Difficulty in walking, not elsewhere classified (R26.2)    Time: 8776-8746 PT Time Calculation (min) (ACUTE ONLY): 30 min   Charges:   PT  Evaluation $PT Eval Low Complexity: 1 Low PT Treatments $Therapeutic Activity: 8-22 mins PT General Charges $$ ACUTE PT VISIT: 1 Visit         Stann, PT Acute Rehabilitation Services Office: [phone] 07/29/2024   Stann DELENA Ohara 07/29/2024, 1:27 PM

## 2024-07-29 NOTE — Assessment & Plan Note (Deleted)
 DNR confirmed at the time of admission Patient will need a gold out of facility DNR form at the time of discharge

## 2024-07-29 NOTE — Assessment & Plan Note (Signed)
 CT renal stone study revealed prominent bilateral hydronephrosis and hydroureter new since prior study Without an obstructing stone or mass identified, likely representing reflux disease vs. cystitis Discussed with urology, Ana Franco (who reviewed with Dr. Devere) Likely chronic in nature Recommend placement of foley and then repeat renal US  in 48 hours If improved, will leave foley in place and follow up outpatient with urology

## 2024-07-30 DIAGNOSIS — R1084 Generalized abdominal pain: Secondary | ICD-10-CM | POA: Diagnosis not present

## 2024-07-30 NOTE — Assessment & Plan Note (Signed)
 Stump appears well healed Consider 81 mg ASA daily if not resuming AC

## 2024-07-30 NOTE — Assessment & Plan Note (Signed)
 DNR confirmed at the time of admission Patient will need a gold out of facility DNR form at the time of discharge

## 2024-07-30 NOTE — Assessment & Plan Note (Signed)
 Presented with reported abdominal and flank pain No current symptoms UA is somewhat abnormal H/o ESBL UTI so placed on contact precautions Urine culture with multiple organisms, no specific organism She does not appear to need ongoing antibiotics

## 2024-07-30 NOTE — Assessment & Plan Note (Signed)
 Hold Vytorin  given limited inpatient utility

## 2024-07-30 NOTE — Assessment & Plan Note (Signed)
 No longer on Eliquis  Rate controlled with metoprolol  She was supposed to f/u with cardiology

## 2024-07-30 NOTE — Assessment & Plan Note (Signed)
 CT renal stone study revealed prominent bilateral hydronephrosis and hydroureter new since prior study Without an obstructing stone or mass identified, likely representing reflux disease vs. cystitis Discussed with urology, Ana Franco (who reviewed with Dr. Devere) Likely chronic in nature Recommend placement of foley and then repeat renal US  in 48 hours If improved, will leave foley in place and follow up outpatient with urology

## 2024-07-30 NOTE — Progress Notes (Signed)
 " Progress Note   Patient: Ana Franco FMW:[id number] DOB: 12-16-1925 DOA: 07/27/2024     2 DOS: the patient was seen and examined on 07/30/2024   Brief hospital course: 89yo with h/o afib on Eliquis , stage 3b CKD, CVA, PVD s/p L BKA, and ESSBL UTI (10/2023) who presented on 12/16 with abdominal and B flank pain. +UTI. CT with B hydronephrosis and hydroureter thought to be related to reflux disease. She was started on meropenem  but appears to be contaminant rather than infection so antibiotics have been stopped.   Assessment & Plan Abdominal pain Presented with reported abdominal and flank pain No current symptoms UA is somewhat abnormal H/o ESBL UTI so placed on contact precautions Urine culture with multiple organisms, no specific organism She does not appear to need ongoing antibiotics Bilateral hydronephrosis CT renal stone study revealed prominent bilateral hydronephrosis and hydroureter new since prior study Without an obstructing stone or mass identified, likely representing reflux disease vs. cystitis Discussed with urology, Ole Bourdon (who reviewed with Dr. Devere) Likely chronic in nature Recommend placement of foley and then repeat renal US  in 48 hours If improved, will leave foley in place and follow up outpatient with urology AKI (acute kidney injury) Baseline creatinine 1.4-1.6 with GFR 30-35 Presented with creatinine 1.98, GFR 23 Improved back to baseline Avoid nephrotoxic agents, dehydration, and hypotension Mild non-anion gap metabolic acidosis; will add bicarb for 3 days and recommend repeat labs on 12/21 Generalized weakness Possibly related to bladder dysfunction PT/OT have evaluated Based on her current level of function, she is recommended for STR in SNF Will complete FL2 and send out Will continue therapy through the weekend in case she demonstrates enough improvement to return to ALF on 12/22 (anticipate dc on that date) Permanent atrial  fibrillation (HCC) No longer on Eliquis  Rate controlled with metoprolol  She was supposed to f/u with cardiology  Hx of BKA, left (HCC) H/O: CVA (cerebrovascular accident) Stump appears well healed Consider 81 mg ASA daily if not resuming AC HLD (hyperlipidemia) Hold Vytorin  given limited inpatient utility Essential hypertension Resume metoprolol  with hold parameters Hold losartan  for now, as BP is controlled without (132/66 currently) BP goal is < 160 based on age Anemia of chronic disease Normocytic anemia Appears to be stable Hyponatremia Chronic, stable DNR (do not resuscitate) DNR confirmed at the time of admission Patient will need a gold out of facility DNR form at the time of discharge       Consultants: Urology - telephone only PT OT Inpatient case management   Procedures: None   Antibiotics: Meropenem  12/17-18  30 Day Unplanned Readmission Risk Score    Flowsheet Row ED to Hosp-Admission (Current) from 07/27/2024 in [facility]  30 Day Unplanned Readmission Risk Score (%) 32.91 Filed at 07/30/2024 0801    This score is the patient's risk of an unplanned readmission within 30 days of being discharged (0 -100%). The score is based on dignosis, age, lab data, medications, orders, and past utilization.   Low:  0-14.9   Medium: 15-21.9   High: 22-29.9   Extreme: 30 and above           Subjective: Reports feeling a little better.  She is mostly noticing abdominal/flank pain in the AM and it resolves.  Foley placed yesterday.   Objective: Vitals:   07/29/24 2300 07/30/24 0652  BP: 134/60 (!) 143/59  Pulse: 72 67  Resp: 16 18  Temp: 98 F (36.7 C) 97.8 F (36.6 C)  SpO2: 96% 95%    Intake/Output Summary (Last 24 hours) at 07/30/2024 0912 Last data filed at 07/30/2024 0753 Gross per 24 hour  Intake 240 ml  Output 1950 ml  Net -1710 ml   Filed Weights   07/28/24 1233 07/29/24 0500 07/30/24 0500  Weight: 73.2 kg 74.8 kg 74.4 kg     Exam:  General:  Appears calm and comfortable and is in NAD Eyes:  normal lids, iris ENT:  grossly normal hearing, lips & tongue, mmm Cardiovascular:  RRR. No LE edema.  Respiratory:   CTA bilaterally with no wheezes/rales/rhonchi.  Normal respiratory effort. Abdomen:  soft, NT, ND Skin:  no rash or induration seen on limited exam Musculoskeletal:  grossly normal tone BUE/BLE, good ROM, no bony abnormality Psychiatric:  grossly normal mood and affect, speech fluent and appropriate, AOx3 Neurologic:  CN 2-12 grossly intact, moves all extremities in coordinated fashion  Data Reviewed: I have reviewed the patient's lab results since admission.  Pertinent labs for today include:   None today     Family Communication: None present  Mobility: PT/OT Consulted and are recommending - Recommend Snf, Barriers To Snf Placement - Toc To F/U With Patient/Family For D/C Plans12/18/2025 1537    Code Status: Limited: Do not attempt resuscitation (DNR) -DNR-LIMITED -Do Not Intubate/DNI     Disposition: Status is: Inpatient Remains inpatient appropriate because: ongoing management     Time spent: 50 minutes  Unresulted Labs (From admission, onward)     Start     Ordered   08/01/24 0500  CBC  Tomorrow morning,   R       Question:  Specimen collection method  Answer:  Lab=Lab collect   07/29/24 1444   08/01/24 0500  Basic metabolic panel with GFR  Tomorrow morning,   R       Question:  Specimen collection method  Answer:  Lab=Lab collect   07/29/24 1444             Author: Delon Herald, MD 07/30/2024 9:12 AM  For on call review [email or web address]            "

## 2024-07-30 NOTE — Assessment & Plan Note (Signed)
 Possibly related to bladder dysfunction PT/OT have evaluated Based on her current level of function, she is recommended for STR in SNF Will complete FL2 and send out Will continue therapy through the weekend in case she demonstrates enough improvement to return to ALF on 12/22 (anticipate dc on that date)

## 2024-07-30 NOTE — Plan of Care (Signed)
" °  Problem: Clinical Measurements: Goal: Ability to maintain clinical measurements within normal limits will improve Outcome: Progressing Goal: Cardiovascular complication will be avoided Outcome: Progressing   Problem: Nutrition: Goal: Adequate nutrition will be maintained Outcome: Progressing   Problem: Elimination: Goal: Will not experience complications related to urinary retention Outcome: Progressing   Problem: Pain Managment: Goal: General experience of comfort will improve and/or be controlled Outcome: Progressing   "

## 2024-07-30 NOTE — Assessment & Plan Note (Signed)
 Normocytic anemia Appears to be stable

## 2024-07-30 NOTE — Assessment & Plan Note (Signed)
 Baseline creatinine 1.4-1.6 with GFR 30-35 Presented with creatinine 1.98, GFR 23 Improved back to baseline Avoid nephrotoxic agents, dehydration, and hypotension Mild non-anion gap metabolic acidosis; will add bicarb for 3 days and recommend repeat labs on 12/21

## 2024-07-30 NOTE — Assessment & Plan Note (Signed)
 Resume metoprolol  with hold parameters Hold losartan  for now, as BP is controlled without (132/66 currently) BP goal is < 160 based on age

## 2024-07-30 NOTE — Assessment & Plan Note (Signed)
 Chronic, stable

## 2024-07-30 NOTE — TOC Progression Note (Addendum)
 Transition of Care [facility]) - Progression Note    Patient Details  Name: Ana Franco MRN: [id number] Date of Birth: [birth year]  Transition of Care [facility]) CM/SW Contact  Toy LITTIE Agar, RN Phone Number:[phone]  07/30/2024, 8:44 AM  Clinical Narrative:    CM  received call from Kristin @ [facility] to update CM that facility will potentially have a bed SNF bed for patient on Monday. Kristin has to follow up and will call CM back with update.   [facility] does not have any SNF beds available. Cm spoke to son Ana Franco to discuss disposition planning. Son explains that patient just has a recent SNF stay for about 6-7 weeks and is not sure that she will have anymore medicare days left. Son agreeable to faxing out for bed offers and having therapy to work with patient more frequently with the hopes of patient reaching baseline and meeting criteria for ALF vs SNF.  Cm will follow up for disposition plan  1355 patient info has been faxed out for bed offers. CM has asked PT to see patient more frequently if possible. Insurance auth initiated. Shara ID# [id number] pending    Expected Discharge Plan: Skilled Nursing Facility Barriers to Discharge: No Barriers Identified, Continued Medical Work up               Expected Discharge Plan and Services In-house Referral: NA Discharge Planning Services: CM Consult Post Acute Care Choice: Skilled Nursing Facility Living arrangements for the past 2 months: Assisted Living Facility                 DME Arranged: N/A DME Agency: NA       HH Arranged: NA HH Agency: NA         Social Drivers of Health (SDOH) Interventions SDOH Screenings   Food Insecurity: No Food Insecurity (07/28/2024)  Housing: Low Risk (07/28/2024)  Transportation Needs: Unmet Transportation Needs (07/28/2024)  Utilities: Not At Risk (07/28/2024)  Social Connections: Moderately Integrated (07/28/2024)  Recent Concern: Social Connections - Moderately  Isolated (07/28/2024)  Tobacco Use: Low Risk (07/27/2024)    Readmission Risk Interventions    07/29/2024    2:41 PM 05/03/2024    9:44 AM  Readmission Risk Prevention Plan  Transportation Screening Complete Complete  PCP or Specialist Appt within 3-5 Days  Complete  HRI or Home Care Consult  Complete  Social Work Consult for Recovery Care Planning/Counseling  Complete  Palliative Care Screening  Not Applicable  Medication Review [employer]) Complete Complete  PCP or Specialist appointment within 3-5 days of discharge Complete   HRI or Home Care Consult Complete   SW Recovery Care/Counseling Consult Complete   Palliative Care Screening Not Applicable   Skilled Nursing Facility Complete

## 2024-07-30 NOTE — Plan of Care (Signed)

## 2024-07-30 NOTE — Progress Notes (Signed)
 Physical Therapy Treatment Patient Details Name: Kehinde Totzke MRN: [id number] DOB: [birth year] Today's Date: 07/30/2024   History of Present Illness Allessandra Bernardi is a 89 y.o. female admitted 07/27/24 with UTI with medical history significant for permanent A-fib on Eliquis , CKD 3B, bladder wall trabeculation with multiple bladder diverticula, history of CVA, uses a walker at baseline, history of left below the knee amputation, history of ESBL E. COLI UTI 10/21/2023, who presents to the ER due to lower abdominal pain and bilateral flank pain.    PT Comments   Pt admitted with above diagnosis.  Pt currently with functional limitations due to the deficits listed below (see PT Problem List).  Pt in bed when PT arrived. Pt agreeable to therapy intervention. Pt reported mild abdominal pain. Pt required min A to initiate to EOB and mod A to complete tasks, mod A for sit to stand  from EOB and min A for initial standing balance with strong posterior lean pt able to correct with cues and facilitation for extension posture and anterior weight shift, pt reports amb short distances s/p fall in September and able to ambulate in hallway with RW, min A, cues for 40 feet. Pt left seated in recliner, all needs in place. Pt reports having ALF staff assist with don/doff of L prosthetic PLOF. Pending pt progress with therapy while in hospital pt may progress to return to ALF vs SNF. Pt will benefit from acute skilled PT to increase their independence and safety with mobility to allow discharge.      If plan is discharge home, recommend the following: Assistance with cooking/housework;Assist for transportation;A little help with walking and/or transfers;A lot of help with bathing/dressing/bathroom   Can travel by private vehicle     No  Equipment Recommendations  None recommended by PT    Recommendations for Other Services       Precautions / Restrictions Precautions Precautions: Fall Recall of  Precautions/Restrictions: Intact Precaution/Restrictions Comments: L BKA-prosthesis in room Restrictions Weight Bearing Restrictions Per Provider Order: No     Mobility  Bed Mobility Overal bed mobility: Needs Assistance Bed Mobility: Sit to Supine     Supine to sit: Mod assist, Min assist     General bed mobility comments: min A for transitioning to sitting, mod A to complete scoot to EOB with use of bed pad and hospital bed features    Transfers Overall transfer level: Needs assistance Equipment used: Rolling walker (2 wheels) Transfers: Sit to/from Stand Sit to Stand: From elevated surface, Mod assist           General transfer comment: min A, cues and facilitation for anterior weight shift and increased BOS as well as extension posture with pt exhibiting kyphosis and head forward    Ambulation/Gait Ambulation/Gait assistance: Min assist, Contact guard assist Gait Distance (Feet): 40 Feet Assistive device: Rolling walker (2 wheels) Gait Pattern/deviations: Step-to pattern, Trunk flexed Gait velocity: decreased     General Gait Details: L LE prosthesis donned, CGA to min a with fatigue, A for RW management for turns and approach to sitting surface, one episode of decreased L foot clearance no overt LOB, pt reported L UE discomfort with prolonged gait tasks due to recent L UE fx and increased pain with WB   Stairs             Wheelchair Mobility     Tilt Bed    Modified Rankin (Stroke Patients Only)       Balance Overall balance assessment: Needs  assistance Sitting-balance support: Bilateral upper extremity supported, Feet supported Sitting balance-Leahy Scale: Fair Sitting balance - Comments: CGA with cues for attention to midline and trunk flexion with anterior lean, improved sitting balance with B LE on floor   Standing balance support: During functional activity, Reliant on assistive device for balance, Bilateral upper extremity  supported Standing balance-Leahy Scale: Poor Standing balance comment: static standing balance adn gait requires min A x1  to CGA with B UE support at [employer] Communication Communication: No apparent difficulties Factors Affecting Communication: Hearing impaired  Cognition Arousal: Alert Behavior During Therapy: WFL for tasks assessed/performed   PT - Cognitive impairments: No apparent impairments                       PT - Cognition Comments: increased time for motor processing and verbal response Following commands: Impaired Following commands impaired: Follows one step commands with increased time    Cueing Cueing Techniques: Verbal cues, Gestural cues, Tactile cues, Visual cues  Exercises      General Comments        Pertinent Vitals/Pain Pain Assessment Pain Assessment: Faces Faces Pain Scale: Hurts little more Pain Location: abdominal and L UE with WB at RW Pain Descriptors / Indicators: Aching, Constant, Dull Pain Intervention(s): Limited activity within patient's tolerance, Monitored during session    Home Living                          Prior Function            PT Goals (current goals can now be found in the care plan section) Acute Rehab PT Goals Patient Stated Goal: return to ALF, improve strength PT Goal Formulation: With patient Time For Goal Achievement: 08/12/24 Potential to Achieve Goals: Fair Progress towards PT goals: Progressing toward goals    Frequency    Min 4X/week      PT Plan      Co-evaluation              AM-PAC PT 6 Clicks Mobility   Outcome Measure  Help needed turning from your back to your side while in a flat bed without using bedrails?: A Little Help needed moving from lying on your back to sitting on the side of a flat bed without using bedrails?: A Lot Help needed moving to and from a bed to a chair (including a wheelchair)?: A Little Help  needed standing up from a chair using your arms (e.g., wheelchair or bedside chair)?: A Little Help needed to walk in hospital room?: A Little Help needed climbing 3-5 steps with a railing? : Total 6 Click Score: 15    End of Session Equipment Utilized During Treatment: Gait belt Activity Tolerance: Patient tolerated treatment well Patient left: in bed;with nursing/sitter in room;with call bell/phone within reach Nurse Communication: Mobility status PT Visit Diagnosis: Unsteadiness on feet (R26.81);Muscle weakness (generalized) (M62.81);Difficulty in walking, not elsewhere classified (R26.2)     Time: 8383-8356 PT Time Calculation (min) (ACUTE ONLY): 27 min  Charges:    $Gait Training: 8-22 mins $Therapeutic Activity: 8-22 mins PT General Charges $$ ACUTE PT VISIT: 1 Visit                     [place], PT Acute Rehab  [place] VEAR Drone 07/30/2024, 5:11 PM

## 2024-07-31 ENCOUNTER — Inpatient Hospital Stay (HOSPITAL_COMMUNITY)

## 2024-07-31 DIAGNOSIS — R1084 Generalized abdominal pain: Secondary | ICD-10-CM | POA: Diagnosis not present

## 2024-07-31 MED ORDER — LIDOCAINE 5 % EX PTCH
1.0000 | MEDICATED_PATCH | CUTANEOUS | Status: DC
  Administered 2024-07-31 – 2024-08-01 (×2): 1 via TRANSDERMAL
  Filled 2024-07-31 (×2): qty 1

## 2024-07-31 NOTE — Assessment & Plan Note (Signed)
 Baseline creatinine 1.4-1.6 with GFR 30-35 Presented with creatinine 1.98, GFR 23 Improved back to baseline Avoid nephrotoxic agents, dehydration, and hypotension Mild non-anion gap metabolic acidosis; will add bicarb for 3 days and recommend repeat labs on 12/21

## 2024-07-31 NOTE — Assessment & Plan Note (Addendum)
 Presented with reported abdominal and flank pain No current symptoms UA is somewhat abnormal H/o ESBL UTI so placed on contact precautions Urine culture with multiple organisms, no specific organism Antibiotics were stopped

## 2024-07-31 NOTE — Assessment & Plan Note (Signed)
 Chronic, stable

## 2024-07-31 NOTE — Assessment & Plan Note (Signed)
 DNR confirmed at the time of admission Patient will need a gold out of facility DNR form at the time of discharge

## 2024-07-31 NOTE — Assessment & Plan Note (Signed)
 Hold Vytorin  given limited inpatient utility

## 2024-07-31 NOTE — Assessment & Plan Note (Signed)
 Normocytic anemia Appears to be stable

## 2024-07-31 NOTE — Assessment & Plan Note (Signed)
 No longer on Eliquis  Rate controlled with metoprolol  She was supposed to f/u with cardiology

## 2024-07-31 NOTE — Plan of Care (Signed)
   Problem: Coping: Goal: Level of anxiety will decrease Outcome: Progressing   Problem: Safety: Goal: Ability to remain free from injury will improve Outcome: Progressing   Problem: Skin Integrity: Goal: Risk for impaired skin integrity will decrease Outcome: Progressing

## 2024-07-31 NOTE — Assessment & Plan Note (Signed)
 Possibly related to bladder dysfunction PT/OT have evaluated Based on her current level of function, she is recommended for STR in SNF Will complete FL2 and send out Will continue therapy through the weekend in case she demonstrates enough improvement to return to ALF on 12/22 (anticipate dc on that date)

## 2024-07-31 NOTE — Assessment & Plan Note (Signed)
 Resume metoprolol  with hold parameters Hold losartan  for now, as BP is controlled without (132/66 currently) BP goal is < 160 based on age

## 2024-07-31 NOTE — Assessment & Plan Note (Signed)
 Stump appears well healed Consider 81 mg ASA daily if not resuming AC

## 2024-07-31 NOTE — Progress Notes (Signed)
 " Progress Note   Patient: Ana Franco FMW:[id number] DOB: [birth year] DOA: 07/27/2024     3 DOS: the patient was seen and examined on 07/31/2024   Brief hospital course: 89yo with h/o afib on Eliquis , stage 3b CKD, CVA, PVD s/p L BKA, and ESSBL UTI (10/2023) who presented on 12/16 with abdominal and B flank pain. +UTI. CT with B hydronephrosis and hydroureter thought to be related to reflux disease. She was started on meropenem  but appears to be contaminant rather than infection so antibiotics have been stopped.    Assessment & Plan Abdominal pain Presented with reported abdominal and flank pain No current symptoms UA is somewhat abnormal H/o ESBL UTI so placed on contact precautions Urine culture with multiple organisms, no specific organism Antibiotics were stopped Bilateral hydronephrosis CT renal stone study revealed prominent bilateral hydronephrosis and hydroureter new since prior study Without an obstructing stone or mass identified, likely representing reflux disease vs. cystitis Discussed with urology, Ole Bourdon (who reviewed with Dr. Devere) Likely chronic in nature Recommend placement of foley and then repeat renal US  in 48 hours - planned for 12/21 If improved, will leave foley in place and follow up outpatient with urology AKI (acute kidney injury) Baseline creatinine 1.4-1.6 with GFR 30-35 Presented with creatinine 1.98, GFR 23 Improved back to baseline Avoid nephrotoxic agents, dehydration, and hypotension Mild non-anion gap metabolic acidosis; will add bicarb for 3 days and recommend repeat labs on 12/21 Generalized weakness Possibly related to bladder dysfunction PT/OT have evaluated Based on her current level of function, she is recommended for STR in SNF Will complete FL2 and send out Will continue therapy through the weekend in case she demonstrates enough improvement to return to ALF on 12/22 (anticipate dc on that date) Permanent atrial  fibrillation (HCC) No longer on Eliquis  Rate controlled with metoprolol  She was supposed to f/u with cardiology  Hx of BKA, left (HCC) H/O: CVA (cerebrovascular accident) Stump appears well healed Consider 81 mg ASA daily if not resuming AC HLD (hyperlipidemia) Hold Vytorin  given limited inpatient utility Essential hypertension Resume metoprolol  with hold parameters Hold losartan  for now, as BP is controlled without (132/66 currently) BP goal is < 160 based on age Anemia of chronic disease Normocytic anemia Appears to be stable Hyponatremia Chronic, stable DNR (do not resuscitate) DNR confirmed at the time of admission Patient will need a gold out of facility DNR form at the time of discharge       Consultants: Urology - telephone only PT OT Inpatient case management   Procedures: None   Antibiotics: Meropenem  12/17-18  30 Day Unplanned Readmission Risk Score    Flowsheet Row ED to Hosp-Admission (Current) from 07/27/2024 in [facility]  30 Day Unplanned Readmission Risk Score (%) 30.94 Filed at 07/31/2024 0800    This score is the patient's risk of an unplanned readmission within 30 days of being discharged (0 -100%). The score is based on dignosis, age, lab data, medications, orders, and past utilization.   Low:  0-14.9   Medium: 15-21.9   High: 22-29.9   Extreme: 30 and above           Subjective: Feeling ok.  AM abdominal pain has resolved.  Still generally weak.  No new concerns.   Objective: Vitals:   07/30/24 2137 07/31/24 0542  BP: 125/61 (!) 141/57  Pulse: 76 (!) 52  Resp: 20 17  Temp: 97.6 F (36.4 C) 97.6 F (36.4 C)  SpO2: 97% 97%  Intake/Output Summary (Last 24 hours) at 07/31/2024 0812 Last data filed at 07/31/2024 0500 Gross per 24 hour  Intake 240 ml  Output 900 ml  Net -660 ml   Filed Weights   07/29/24 0500 07/30/24 0500 07/31/24 0500  Weight: 74.8 kg 74.4 kg 73.4 kg    Exam:  General:  Appears calm  and comfortable and is in NAD Eyes:  normal lids, iris ENT:  grossly normal hearing, lips & tongue, mmm Cardiovascular:  RRR. No LE edema.  Respiratory:   CTA bilaterally with no wheezes/rales/rhonchi.  Normal respiratory effort. Abdomen:  soft, NT, ND Skin:  no rash or induration seen on limited exam Musculoskeletal:  generalized weakness Psychiatric:  blunted mood and affect, speech fluent and appropriate, AOx3 Neurologic:  CN 2-12 grossly intact  Data Reviewed: I have reviewed the patient's lab results since admission.  Pertinent labs for today include:   Ordered for tomorrow     Family Communication: None present  Mobility: PT/OT Consulted and are recommending - Recommend Snf, Barriers To Snf Placement - Toc To F/U With Patient/Family For D/C Plans (Maybe Able To Transition To Alf With Increased Staff Support And Assist)07/30/2024 1700    Code Status: Limited: Do not attempt resuscitation (DNR) -DNR-LIMITED -Do Not Intubate/DNI    Disposition: Status is: Inpatient Remains inpatient appropriate because: ongoing monitoring     Time spent: 35 minutes  Unresulted Labs (From admission, onward)     Start     Ordered   08/01/24 0500  CBC  Tomorrow morning,   R       Question:  Specimen collection method  Answer:  Lab=Lab collect   07/29/24 1444   08/01/24 0500  Basic metabolic panel with GFR  Tomorrow morning,   R       Question:  Specimen collection method  Answer:  Lab=Lab collect   07/29/24 1444             Author: Delon Herald, MD 07/31/2024 8:12 AM  For on call review [email or web address]            "

## 2024-07-31 NOTE — Assessment & Plan Note (Addendum)
 CT renal stone study revealed prominent bilateral hydronephrosis and hydroureter new since prior study Without an obstructing stone or mass identified, likely representing reflux disease vs. cystitis Discussed with urology, Ana Franco (who reviewed with Dr. Devere) Likely chronic in nature Recommend placement of foley and then repeat renal US  in 48 hours - planned for 12/21 If improved, will leave foley in place and follow up outpatient with urology

## 2024-07-31 NOTE — Progress Notes (Signed)
 Mobility Specialist Progress Note:   07/31/24 1319  Mobility  Activity  (Bed Exercises)  Level of Assistance Independent  Range of Motion/Exercises Active  Activity Response Tolerated well  Mobility Referral Yes  Mobility visit 1 Mobility  Mobility Specialist Start Time (ACUTE ONLY) 1300  Mobility Specialist Stop Time (ACUTE ONLY) 1314  Mobility Specialist Time Calculation (min) (ACUTE ONLY) 14 min   Pt was received in bed and agreed to mobility. Pt c/o pain in UE, opted for LE bed exercises: Seated BLE Exercises: 10 reps each  1) Toe Raise/ Heel Raise: 1 x 10   2) Ankle Pumps: 1 x 10  3) Hip Adduction (pillow squeezes): 1 x 10  Returned to bed with all needs met. Call bell in reach.   [employer] - Mobility Specialist

## 2024-08-01 DIAGNOSIS — R1084 Generalized abdominal pain: Secondary | ICD-10-CM | POA: Diagnosis not present

## 2024-08-01 LAB — CBC
HCT: 29.9 % — ABNORMAL LOW (ref 36.0–46.0)
Hemoglobin: 9.5 g/dL — ABNORMAL LOW (ref 12.0–15.0)
MCH: 30.2 pg (ref 26.0–34.0)
MCHC: 31.8 g/dL (ref 30.0–36.0)
MCV: 94.9 fL (ref 80.0–100.0)
Platelets: 208 K/uL (ref 150–400)
RBC: 3.15 MIL/uL — ABNORMAL LOW (ref 3.87–5.11)
RDW: 13.9 % (ref 11.5–15.5)
WBC: 5.1 K/uL (ref 4.0–10.5)
nRBC: 0 % (ref 0.0–0.2)

## 2024-08-01 LAB — BASIC METABOLIC PANEL WITH GFR
Anion gap: 9 (ref 5–15)
BUN: 24 mg/dL — ABNORMAL HIGH (ref 8–23)
CO2: 22 mmol/L (ref 22–32)
Calcium: 8.4 mg/dL — ABNORMAL LOW (ref 8.9–10.3)
Chloride: 105 mmol/L (ref 98–111)
Creatinine, Ser: 1.02 mg/dL — ABNORMAL HIGH (ref 0.44–1.00)
GFR, Estimated: 50 mL/min — ABNORMAL LOW
Glucose, Bld: 99 mg/dL (ref 70–99)
Potassium: 4.1 mmol/L (ref 3.5–5.1)
Sodium: 136 mmol/L (ref 135–145)

## 2024-08-01 MED ORDER — OXYCODONE HCL 5 MG PO TABS
2.5000 mg | ORAL_TABLET | Freq: Once | ORAL | Status: AC
  Administered 2024-08-01: 2.5 mg via ORAL
  Filled 2024-08-01: qty 1

## 2024-08-01 MED ORDER — ENOXAPARIN SODIUM 40 MG/0.4ML IJ SOSY
40.0000 mg | PREFILLED_SYRINGE | INTRAMUSCULAR | Status: DC
  Administered 2024-08-02: 40 mg via SUBCUTANEOUS
  Filled 2024-08-01: qty 0.4

## 2024-08-01 NOTE — Plan of Care (Signed)

## 2024-08-01 NOTE — Assessment & Plan Note (Addendum)
 DNR confirmed at the time of admission Patient will need a gold out of facility DNR form at the time of discharge

## 2024-08-01 NOTE — Assessment & Plan Note (Addendum)
 Stump appears well healed Consider 81 mg ASA daily if not resuming AC

## 2024-08-01 NOTE — Plan of Care (Signed)
   Problem: Activity: Goal: Risk for activity intolerance will decrease Outcome: Progressing   Problem: Nutrition: Goal: Adequate nutrition will be maintained Outcome: Progressing   Problem: Coping: Goal: Level of anxiety will decrease Outcome: Progressing   Problem: Safety: Goal: Ability to remain free from injury will improve Outcome: Progressing   Problem: Skin Integrity: Goal: Risk for impaired skin integrity will decrease Outcome: Progressing

## 2024-08-01 NOTE — Assessment & Plan Note (Addendum)
 Possibly related to bladder dysfunction PT/OT have evaluated Based on her current level of function, she is recommended for STR in SNF Will complete FL2 and send out She is now medically stable for dc and is awaiting authorization

## 2024-08-01 NOTE — TOC Progression Note (Signed)
 Transition of Care [facility]) - Progression Note    Patient Details  Name: Ana Franco MRN: [id number] Date of Birth: [birth year]  Transition of Care [facility]) CM/SW Contact  Lorraine LILLETTE Fenton, LCSW Phone Number: 08/01/2024, 9:10 AM  Clinical Narrative:    MD reached out on the likelihood pt can DC to her facility today, record review indicates [facility] contact can accept pt Monday.  Also noted Auth was pending.  CSW reached out to [facility]- Shara is now being reviewed by the medical director- states there may be a decision made today.  CSW sent secure chat to the MD updating.  ICM following.    Expected Discharge Plan: Skilled Nursing Facility Barriers to Discharge: No Barriers Identified, Continued Medical Work up               Expected Discharge Plan and Services In-house Referral: NA Discharge Planning Services: CM Consult Post Acute Care Choice: Skilled Nursing Facility Living arrangements for the past 2 months: Assisted Living Facility                 DME Arranged: N/A DME Agency: NA       HH Arranged: NA HH Agency: NA         Social Drivers of Health (SDOH) Interventions SDOH Screenings   Food Insecurity: No Food Insecurity (07/28/2024)  Housing: Low Risk (07/28/2024)  Transportation Needs: Unmet Transportation Needs (07/28/2024)  Utilities: Not At Risk (07/28/2024)  Social Connections: Moderately Integrated (07/28/2024)  Recent Concern: Social Connections - Moderately Isolated (07/28/2024)  Tobacco Use: Low Risk (07/27/2024)    Readmission Risk Interventions    07/29/2024    2:41 PM 05/03/2024    9:44 AM  Readmission Risk Prevention Plan  Transportation Screening Complete Complete  PCP or Specialist Appt within 3-5 Days  Complete  HRI or Home Care Consult  Complete  Social Work Consult for Recovery Care Planning/Counseling  Complete  Palliative Care Screening  Not Applicable  Medication Review Oceanographer) Complete Complete  PCP or Specialist  appointment within 3-5 days of discharge Complete   HRI or Home Care Consult Complete   SW Recovery Care/Counseling Consult Complete   Palliative Care Screening Not Applicable   Skilled Nursing Facility Complete

## 2024-08-01 NOTE — Assessment & Plan Note (Addendum)
 CT renal stone study revealed prominent bilateral hydronephrosis and hydroureter new since prior study Without an obstructing stone or mass identified, likely representing reflux disease vs. cystitis Discussed with urology, Ana Franco (who reviewed with Ana Franco) Likely chronic in nature Marked improvement in renal function and resolution of hydronephrosis after foley placement Will leave foley in place and follow up outpatient with urology

## 2024-08-01 NOTE — Assessment & Plan Note (Addendum)
 Hold Vytorin  given limited inpatient utility

## 2024-08-01 NOTE — Assessment & Plan Note (Addendum)
 Normocytic anemia Appears to be stable

## 2024-08-01 NOTE — Assessment & Plan Note (Addendum)
 Resume metoprolol  with hold parameters Hold losartan  for now, as BP is controlled without (132/66 currently) BP goal is < 160 based on age

## 2024-08-01 NOTE — Assessment & Plan Note (Addendum)
 Baseline creatinine 1.4-1.6 with GFR 30-35 Presented with creatinine 1.98, GFR 23 Improved back to baseline, now even better after placement of foley Avoid nephrotoxic agents, dehydration, and hypotension Mild non-anion gap metabolic acidosis; added bicarb for 3 days and resolved

## 2024-08-01 NOTE — Assessment & Plan Note (Addendum)
 Chronic, stable

## 2024-08-01 NOTE — Assessment & Plan Note (Addendum)
 Presented with reported abdominal and flank pain No current symptoms UA is somewhat abnormal H/o ESBL UTI so placed on contact precautions Urine culture with multiple organisms, no specific organism Antibiotics were stopped

## 2024-08-01 NOTE — Progress Notes (Signed)
 " Progress Note   Patient: Ana Franco FMW:[id number] DOB: Aug 10, 1926 DOA: 07/27/2024     4 DOS: the patient was seen and examined on 08/01/2024   Brief hospital course: 89yo with h/o afib on Eliquis , stage 3b CKD, CVA, PVD s/p L BKA, and ESSBL UTI (10/2023) who presented on 12/16 with abdominal and B flank pain. +UTI. CT with B hydronephrosis and hydroureter thought to be related to reflux disease. She was started on meropenem  but appears to be contaminant rather than infection so antibiotics have been stopped.  Foley placed, marked improvement in renal function and hydronephrosis resolved on imaging.  Will dc with foley and outpatient urology f/u.    Assessment & Plan Abdominal pain Presented with reported abdominal and flank pain No current symptoms UA is somewhat abnormal H/o ESBL UTI so placed on contact precautions Urine culture with multiple organisms, no specific organism Antibiotics were stopped Bilateral hydronephrosis CT renal stone study revealed prominent bilateral hydronephrosis and hydroureter new since prior study Without an obstructing stone or mass identified, likely representing reflux disease vs. cystitis Discussed with urology, Ole Bourdon (who reviewed with Dr. Devere) Likely chronic in nature Marked improvement in renal function and resolution of hydronephrosis after foley placement Will leave foley in place and follow up outpatient with urology AKI (acute kidney injury) Baseline creatinine 1.4-1.6 with GFR 30-35 Presented with creatinine 1.98, GFR 23 Improved back to baseline, now even better after placement of foley Avoid nephrotoxic agents, dehydration, and hypotension Mild non-anion gap metabolic acidosis; added bicarb for 3 days and resolved Generalized weakness Possibly related to bladder dysfunction PT/OT have evaluated Based on her current level of function, she is recommended for STR in SNF Will complete FL2 and send out She is now  medically stable for dc and is awaiting authorization Permanent atrial fibrillation (HCC) No longer on Eliquis  Rate controlled with metoprolol  She was supposed to f/u with cardiology  Hx of BKA, left (HCC) H/O: CVA (cerebrovascular accident) Stump appears well healed Consider 81 mg ASA daily if not resuming AC HLD (hyperlipidemia) Hold Vytorin  given limited inpatient utility Essential hypertension Resume metoprolol  with hold parameters Hold losartan  for now, as BP is controlled without (132/66 currently) BP goal is < 160 based on age Anemia of chronic disease Normocytic anemia Appears to be stable Hyponatremia Chronic, stable DNR (do not resuscitate) DNR confirmed at the time of admission Patient will need a gold out of facility DNR form at the time of discharge        Consultants: Urology - telephone only PT OT Inpatient case management   Procedures: None   Antibiotics: Meropenem  12/17-18  30 Day Unplanned Readmission Risk Score    Flowsheet Row ED to Hosp-Admission (Current) from 07/27/2024 in [place] [facility]  30 Day Unplanned Readmission Risk Score (%) 31.25 Filed at 08/01/2024 0401    This score is the patient's risk of an unplanned readmission within 30 days of being discharged (0 -100%). The score is based on dignosis, age, lab data, medications, orders, and past utilization.   Low:  0-14.9   Medium: 15-21.9   High: 22-29.9   Extreme: 30 and above           Subjective: Feeling ok, no specific concerns.   Objective: Vitals:   07/31/24 2133 08/01/24 0545  BP: 125/66 139/68  Pulse: 69 67  Resp:  16  Temp: 97.7 F (36.5 C) 97.7 F (36.5 C)  SpO2: 98% 93%    Intake/Output Summary (Last 24 hours)  at 08/01/2024 1138 Last data filed at 08/01/2024 0900 Gross per 24 hour  Intake 720 ml  Output 750 ml  Net -30 ml   Filed Weights   07/30/24 0500 07/31/24 0500 08/01/24 0500  Weight: 74.4 kg 73.4 kg 73.7 kg    Exam:  General:   Appears calm and comfortable and is in NAD Eyes:  normal lids, iris ENT:  grossly normal hearing, lips & tongue, mmm Cardiovascular:  RRR. No LE edema.  Respiratory:   CTA bilaterally with no wheezes/rales/rhonchi.  Normal respiratory effort. Abdomen:  soft, NT, ND Skin:  no rash or induration seen on limited exam Musculoskeletal:  generalized weakness Psychiatric:  grossly normal mood and affect, speech fluent and appropriate, AOx3 Neurologic:  CN 2-12 grossly intact  Data Reviewed: I have reviewed the patient's lab results since admission.  Pertinent labs for today include:   BUN 24/Creatinine 1.02/GFR 50, significantly improved WBC 5.1 Hgb 9.5     Family Communication: None present; I called her son today  Mobility: PT/OT Consulted and are recommending - Recommend Snf, Barriers To Snf Placement - Toc To F/U With Patient/Family For D/C Plans (Maybe Able To Transition To Alf With Increased Staff Support And Assist)07/30/2024 1700    Code Status: Limited: Do not attempt resuscitation (DNR) -DNR-LIMITED -Do Not Intubate/DNI     Disposition: Status is: Inpatient Remains inpatient appropriate because: awaiting placement     Time spent: 50 minutes  Unresulted Labs (From admission, onward)    None        Author: Delon Herald, MD 08/01/2024 11:38 AM  For on call review [email or web address].            "

## 2024-08-01 NOTE — Assessment & Plan Note (Addendum)
 No longer on Eliquis  Rate controlled with metoprolol  She was supposed to f/u with cardiology

## 2024-08-02 ENCOUNTER — Ambulatory Visit (HOSPITAL_BASED_OUTPATIENT_CLINIC_OR_DEPARTMENT_OTHER): Admitting: Family

## 2024-08-02 DIAGNOSIS — N1330 Unspecified hydronephrosis: Secondary | ICD-10-CM

## 2024-08-02 LAB — CULTURE, BLOOD (ROUTINE X 2)
Culture: NO GROWTH
Culture: NO GROWTH
Special Requests: ADEQUATE
Special Requests: ADEQUATE

## 2024-08-02 NOTE — Assessment & Plan Note (Addendum)
 No longer on Eliquis  Rate controlled with metoprolol  She was supposed to f/u with cardiology

## 2024-08-02 NOTE — Progress Notes (Addendum)
 Physical Therapy Treatment Patient Details Name: Ana Franco MRN: [id number] DOB: 09-15-1925 Today's Date: 08/02/2024   History of Present Illness Ana Franco is a 89 y.o. female admitted 07/27/24 with UTI with medical history significant for permanent A-fib on Eliquis , CKD 3B, bladder wall trabeculation with multiple bladder diverticula, history of CVA, uses a walker at baseline, history of left below the knee amputation, history of ESBL E. COLI UTI 10/21/2023, who presents to the ER due to lower abdominal pain and bilateral flank pain.    PT Comments  PT - Cognition Comments: MUCH IMPROVED cognition.  AxO x 3 pleasant Lady.  Motivated.  Following all commands and sharing stories about her late husband Norleen and her Son who lives close by but travels alot.   Pt also stated she use to work for the [employer].  Stated she resides at [facility]. Was able to self transfer. Assisted OOB to amb was difficult. General bed mobility comments: increased time and effort esp to complete scooting to EOB.  Assisted with donning L Prosthesis. General transfer comment: Required Mod Assist + 2 to rise from elevated bed with mod posterior lean and instability.  Incrteased time to center self to midline.  Unsteady with turns and back steps.  HIGH FALL RISK. General Gait Details: Very unsteady gait with limited distance due to increased c/o weakness/fatigue.  Improved self ability to advance B LE and weightshift.  Min support to prevent posterior lean.  Increased unsteadiness with turns and esp with back gait to recliner.  Delayed self corrective reacyion responce to LOB.  HIGH FALL RISK. Positioned in recliner to comfort. LPT has rec Pt will need ST Rehab at SNF to address mobility and functional decline prior to safely returning ALF level.  Pt feels she is close to her baseline.    Per EVAL:  Prior Level of Function : Needs assist Mobility Comments: ambulating short distances to/from bathroom with  RW and LLE prosthetic and assist, uses lift chair to stand ADLs Comments: does not go to dining hall, typically eats in room (meals provided). staff assists with bathing on shower seat 2x/wk, LB dressing daily. pt reports assist with toileting      Due to no SNF bed availability, [facility] has inquired about Pt returning to ALF and receiving in house [facility] PT.  Will consult with LPT.     If plan is discharge home, recommend the following: Assistance with cooking/housework;Assist for transportation;A little help with walking and/or transfers;A lot of help with bathing/dressing/bathroom   Can travel by private vehicle     No  Equipment Recommendations  None recommended by PT    Recommendations for Other Services       Precautions / Restrictions       Mobility  Bed Mobility Overal bed mobility: Needs Assistance Bed Mobility: Supine to Sit     Supine to sit: Mod assist, Min assist     General bed mobility comments: increased time and effort esp to complete scooting to EOB.  Assisted with donning L Prosthesis.    Transfers Overall transfer level: Needs assistance Equipment used: Rolling walker (2 wheels) Transfers: Sit to/from Stand Sit to Stand: From elevated surface, Mod assist           General transfer comment: Required Mod Assist + 2 to rise from elevated bed with mod posterior lean and instability.  Incrteased time to center self to midline.  Unsteady with turns and back steps.  HIGH FALL RISK.  Ambulation/Gait Ambulation/Gait assistance: Min assist, Mod assist Gait Distance (Feet): 14 Feet Assistive device: Rolling walker (2 wheels) Gait Pattern/deviations: Step-to pattern, Trunk flexed, Decreased step length - left, Decreased step length - right Gait velocity: decreased     General Gait Details: Very unsteady gait with limited distance due to increased c/o weakness/fatigue.  Improved self ability to advance B LE and weightshift.  Min support to prevent  posterior lean.  Increased unsteadiness with turns and esp with back gait to recliner.  Delayed self corrective reacyion responce to LOB.  HIGH FALL RISK.   Stairs             Wheelchair Mobility     Tilt Bed    Modified Rankin (Stroke Patients Only)       Balance                                            Communication Communication Communication: No apparent difficulties Factors Affecting Communication: Hearing impaired  Cognition Arousal: Alert Behavior During Therapy: WFL for tasks assessed/performed   PT - Cognitive impairments: No apparent impairments                       PT - Cognition Comments: MUCH IMPROVED cognition.  AxO x 3 pleasant Lady.  Motivated.  Stated she resides at [facility]. Eats in her room mostly. Following commands: Impaired Following commands impaired: Follows one step commands with increased time    Cueing Cueing Techniques: Verbal cues, Gestural cues, Tactile cues, Visual cues  Exercises      General Comments        Pertinent Vitals/Pain Pain Assessment Pain Assessment: No/denies pain    Home Living                          Prior Function            PT Goals (current goals can now be found in the care plan section) Progress towards PT goals: Progressing toward goals    Frequency    Min 4X/week      PT Plan      Co-evaluation              AM-PAC PT 6 Clicks Mobility   Outcome Measure  Help needed turning from your back to your side while in a flat bed without using bedrails?: A Lot Help needed moving from lying on your back to sitting on the side of a flat bed without using bedrails?: A Lot Help needed moving to and from a bed to a chair (including a wheelchair)?: A Lot Help needed standing up from a chair using your arms (e.g., wheelchair or bedside chair)?: A Lot Help needed to walk in hospital room?: A Lot Help needed climbing 3-5 steps with a  railing? : Total 6 Click Score: 11    End of Session Equipment Utilized During Treatment: Gait belt Activity Tolerance: Patient tolerated treatment well Patient left: in chair;with nursing/sitter in room Nurse Communication: Mobility status PT Visit Diagnosis: Unsteadiness on feet (R26.81);Muscle weakness (generalized) (M62.81);Difficulty in walking, not elsewhere classified (R26.2)     Time: 9085-9066 PT Time Calculation (min) (ACUTE ONLY): 19 min  Charges:    $Gait Training: 8-22 mins PT General Charges $$ ACUTE PT VISIT: 1 Visit  Katheryn Leap  PTA Acute  Rehabilitation Services Office M-F          [phone]

## 2024-08-02 NOTE — Assessment & Plan Note (Addendum)
Resume Vytorin

## 2024-08-02 NOTE — Assessment & Plan Note (Addendum)
 Normocytic anemia Appears to be stable

## 2024-08-02 NOTE — Assessment & Plan Note (Addendum)
 Possibly related to bladder dysfunction PT/OT have evaluated Based on her current level of function, she was recommended for STR in SNF [facility] sent out  She is now medically stable for dc and has been downgraded to ALF with [facility] therapy

## 2024-08-02 NOTE — Discharge Summary (Signed)
 " Physician Discharge Summary   Patient: Ana Franco MRN: [id number] DOB: January 17, 1926  Admit date:     07/27/2024  Discharge date: 08/02/2024  Discharge Physician: Delon Herald   PCP: Pcp, No   Recommendations at discharge:   You are being discharged back to your ALF with home health physical and occupational therapy Maintain foley catheter Follow up with urology as an outpatient; referral placed Follow up with PCP at ALF Consider outpatient cardiology referral to discuss resumption of Eliquis   Discharge Diagnoses: Principal Problem:   Abdominal pain Active Problems:   Essential hypertension   HLD (hyperlipidemia)   DNR (do not resuscitate)   Permanent atrial fibrillation (HCC)   H/O: CVA (cerebrovascular accident)   Hyponatremia   Hx of BKA, left (HCC)   Anemia of chronic disease   AKI (acute kidney injury)   Bilateral hydronephrosis   Generalized weakness    Hospital Course: 89yo with h/o afib on Eliquis , stage 3b CKD, CVA, PVD s/p L BKA, and ESSBL UTI (10/2023) who presented on 12/16 with abdominal and B flank pain. +UTI. CT with B hydronephrosis and hydroureter thought to be related to reflux disease. She was started on meropenem  but appears to be contaminant rather than infection so antibiotics have been stopped.  Foley placed, marked improvement in renal function and hydronephrosis resolved on imaging.  Will dc with foley and outpatient urology f/u.  Assessment and Plan:  Assessment & Plan Abdominal pain Presented with reported abdominal and flank pain No current symptoms UA is somewhat abnormal H/o ESBL UTI so placed on contact precautions Urine culture with multiple organisms, no specific organism Antibiotics were stopped Bilateral hydronephrosis CT renal stone study revealed prominent bilateral hydronephrosis and hydroureter new since prior study Without an obstructing stone or mass identified, likely representing reflux disease vs. cystitis Discussed with  urology, Ole Bourdon (who reviewed with Dr. Devere) Likely chronic in nature Marked improvement in renal function and resolution of hydronephrosis after foley placement Will leave foley in place and follow up outpatient with urology AKI (acute kidney injury) Baseline creatinine 1.4-1.6 with GFR 30-35 Presented with creatinine 1.98, GFR 23 Improved back to baseline, now even better after placement of foley Avoid nephrotoxic agents, dehydration, and hypotension Mild non-anion gap metabolic acidosis; added bicarb for 3 days and resolved Generalized weakness Possibly related to bladder dysfunction PT/OT have evaluated Based on her current level of function, she was recommended for STR in SNF [facility] sent out  She is now medically stable for dc and has been downgraded to ALF with [facility] therapy Permanent atrial fibrillation (HCC) No longer on Eliquis  Rate controlled with metoprolol  She was supposed to f/u with cardiology  Hx of BKA, left (HCC) H/O: CVA (cerebrovascular accident) Stump appears well healed; traumatic injury from a fall led to amputation Consider 81 mg ASA daily if not resuming AC She does not have an obvious acute need for Lasix  so will hold HLD (hyperlipidemia) Resume Vytorin  Essential hypertension Resume metoprolol  Hold losartan  for now, as BP is controlled without (132/66 currently) BP goal is < 160 based on age Anemia of chronic disease Normocytic anemia Appears to be stable Hyponatremia Chronic, stable DNR (do not resuscitate) DNR confirmed at the time of admission Patient will need a gold out of facility DNR form at the time of discharge       Consultants: Urology - telephone only PT OT Inpatient case management   Procedures: None   Antibiotics: Meropenem  12/17-18    Pain control - [place]  Controlled  Substance Reporting System database was reviewed. and patient was instructed, not to drive, operate heavy machinery, perform activities at  heights, swimming or participation in water activities or provide baby-sitting services while on Pain, Sleep and Anxiety Medications; until their outpatient Physician has advised to do so again. Also recommended to not to take more than prescribed Pain, Sleep and Anxiety Medications.   Disposition: Assisted living Diet recommendation:  Cardiac diet DISCHARGE MEDICATION: Allergies as of 08/02/2024   No Known Allergies      Medication List     PAUSE taking these medications    Eliquis  5 MG Tabs tablet Wait to take this until your doctor or other care provider tells you to start again. Generic drug: apixaban  Take 5 mg by mouth 2 (two) times daily.   furosemide  20 MG tablet Wait to take this until your doctor or other care provider tells you to start again. Commonly known as: LASIX  Take 1 tablet (20 mg total) by mouth daily.   losartan  50 MG tablet Wait to take this until your doctor or other care provider tells you to start again. Commonly known as: COZAAR  Take 50 mg by mouth daily.   potassium chloride  SA 20 MEQ tablet Wait to take this until your doctor or other care provider tells you to start again. Commonly known as: KLOR-CON  M Take 20 mEq by mouth daily.       STOP taking these medications    amLODipine  5 MG tablet Commonly known as: NORVASC    nitrofurantoin (macrocrystal-monohydrate) 100 MG capsule Commonly known as: MACROBID   oxyCODONE  5 MG immediate release tablet Commonly known as: Oxy IR/ROXICODONE        TAKE these medications    acetaminophen  500 MG tablet Commonly known as: TYLENOL  Take 2 tablets (1,000 mg total) by mouth every 8 (eight) hours as needed.   benzonatate 200 MG capsule Commonly known as: TESSALON Take 200 mg by mouth 3 (three) times daily as needed for cough.   beta carotene 25000 UNIT capsule Take 25,000 Units by mouth daily.   Calcium +D3 500-10 MG-MCG Tabs Generic drug: Calcium  Carb-Cholecalciferol Take 1 tablet by mouth  in the morning.   citalopram  20 MG tablet Commonly known as: CELEXA  Take 10 mg by mouth daily.   clotrimazole 1 % cream Commonly known as: LOTRIMIN Apply 1 Application topically 2 (two) times daily as needed (right foot).   docusate sodium  100 MG capsule Commonly known as: COLACE Take 100 mg by mouth daily.   esomeprazole  40 MG capsule Commonly known as: NEXIUM  Take 1 capsule (40 mg total) by mouth 2 (two) times daily before a meal for 14 days, THEN 1 capsule (40 mg total) daily after breakfast. Start taking on: October 23, 2023 What changed: See the new instructions.   ezetimibe -simvastatin  10-20 MG tablet Commonly known as: VYTORIN  Take 1 tablet by mouth every evening.   ferrous sulfate  324 MG Tbec Take 324 mg by mouth in the morning.   gabapentin  100 MG capsule Commonly known as: NEURONTIN  Take 100 mg by mouth 3 (three) times daily.   guaiFENesin 100 MG/5ML liquid Commonly known as: ROBITUSSIN Take 5 mLs by mouth every 6 (six) hours as needed for cough or to loosen phlegm.   lidocaine  5 % Commonly known as: LIDODERM  Place 1 patch onto the skin daily. Remove & Discard patch within 12 hours or as directed by MD   metoprolol  tartrate 25 MG tablet Commonly known as: LOPRESSOR  Take 0.5 tablets (12.5 mg total) by mouth 2 (  two) times daily. Hold for SBP < 105 or HR < 60   ondansetron  4 MG disintegrating tablet Commonly known as: ZOFRAN -ODT Take 4 mg by mouth every 8 (eight) hours as needed for nausea.   oxymetazoline 0.05 % nasal spray Commonly known as: AFRIN Place 2 sprays into both nostrils 2 (two) times daily as needed (nose bleed).   polyethylene glycol 17 g packet Commonly known as: MIRALAX  / GLYCOLAX  Take 17 g by mouth daily as needed for mild constipation.   senna 8.6 MG Tabs tablet Commonly known as: SENOKOT Take 2 tablets by mouth daily.   vitamin B-12 500 MCG tablet Commonly known as: CYANOCOBALAMIN  Take 500 mcg by mouth every morning.         Discharge Exam:    Subjective: Feeling ok.  No specific concerns today.   Objective: Vitals:   08/01/24 2123 08/02/24 0446  BP: 131/69 128/72  Pulse: 64 (!) 59  Resp:  18  Temp: 98.8 F (37.1 C) 97.6 F (36.4 C)  SpO2: 96% 98%    Intake/Output Summary (Last 24 hours) at 08/02/2024 1327 Last data filed at 08/02/2024 1300 Gross per 24 hour  Intake 717 ml  Output 875 ml  Net -158 ml   Filed Weights   07/31/24 0500 08/01/24 0500 08/02/24 0500  Weight: 73.4 kg 73.7 kg 74.1 kg    Exam:  General:  Appears calm and comfortable and is in NAD Eyes:  normal lids, iris ENT:  grossly normal hearing, lips & tongue, mmm Cardiovascular:  RRR, no m/r/g. No LE edema.  Respiratory:   CTA bilaterally with no wheezes/rales/rhonchi.  Normal respiratory effort. Abdomen:  soft, NT, ND Skin:  no rash or induration seen on limited exam Musculoskeletal:  s/p remote L BKA Psychiatric:  grossly normal mood and affect, speech fluent and appropriate, AOx3 Neurologic:  CN 2-12 grossly intact, moves all extremities in coordinated fashion  Data Reviewed: I have reviewed the patient's lab results since admission.  Pertinent labs for today include:  None today     Condition at discharge: improving  The results of significant diagnostics from this hospitalization (including imaging, microbiology, ancillary and laboratory) are listed below for reference.   Imaging Studies: US  RENAL Result Date: 07/31/2024 EXAM: US  Retroperitoneum Complete, Renal. 07/31/2024 04:45:20 PM TECHNIQUE: Real-time ultrasonography of the retroperitoneum renal was performed. COMPARISON: None available CLINICAL HISTORY: 6300 Hydronephrosis 6300 Hydronephrosis FINDINGS: FINDINGS: RIGHT KIDNEY/URETER: Right kidney measures 9.6 x 4.7 x 5 cm with a volume of 119 ml. Normal cortical echogenicity. No hydronephrosis. No calculus. No mass. LEFT KIDNEY/URETER: Left kidney measures 9.6 x 4.7 x 5 cm with a volume of 105 ml.  Normal cortical echogenicity. No hydronephrosis. No calculus. No mass. BLADDER: The urinary bladder is completely decompressed with a urinary catheter in place. IMPRESSION: 1. Interval resolution of the bilateral hydronephrosis. Electronically signed by: Rogelia Myers MD 07/31/2024 05:06 PM EST RP Workstation: [id number]   CT Renal Stone Study Result Date: 07/27/2024 EXAM: CT ABDOMEN AND PELVIS WITHOUT CONTRAST 07/27/2024 08:42:19 PM TECHNIQUE: CT of the abdomen and pelvis was performed without the administration of intravenous contrast. Multiplanar reformatted images are provided for review. Automated exposure control, iterative reconstruction, and/or weight-based adjustment of the mA/kV was utilized to reduce the radiation dose to as low as reasonably achievable. COMPARISON: CT chest abdomen and pelvis 05/02/2024. CLINICAL HISTORY: Abdominal/flank pain, stone suspected. FINDINGS: LOWER CHEST: Cardiac enlargement. Small pleural effusions. Diffuse interstitial changes in the lung bases with bronchial wall thickening likely fibrosis and chronic  bronchitis. LIVER: The liver is unremarkable. GALLBLADDER AND BILE DUCTS: Gallbladder is unremarkable. No biliary ductal dilatation. SPLEEN: The spleen is unremarkable. PANCREAS: The pancreas is unremarkable. ADRENAL GLANDS: The adrenal glands are unremarkable. KIDNEYS, URETERS AND BLADDER: Prominent bilateral hydronephrosis and hydroureter, new since prior study. No obstructing stone or mass is identified. This likely represents reflux disease. The bladder wall is diffusely thickened with trabeculation and cellular formation and bilateral bladder diverticula. This is most likely to represent outlet obstruction or neurogenic bladder, although cystitis could also have this appearance. The bladder appears similar to the prior study. No discrete filling defects or bladder stones are identified. No perinephric or periureteral stranding. GI AND BOWEL: Small esophageal hiatal  hernia. The stomach, small bowel, and colon are not abnormally distended, and no wall thickening is appreciated. The appendix is normal. Diverticula in the sigmoid colon without evidence of acute diverticulitis. There is no bowel obstruction. PERITONEUM AND RETROPERITONEUM: No free air or free fluid in the abdomen. VASCULATURE: Aorta is normal in caliber. No calcification of the aorta. No aneurysm. LYMPH NODES: No lymphadenopathy. REPRODUCTIVE ORGANS: No acute abnormality. BONES AND SOFT TISSUES: Degenerative changes in the spine. Thoracolumbar scoliosis convex towards the left. Degenerative changes in the hips. Old ununited fracture deformity of the superior pubic ramus on the left with healed fracture deformity of the inferior pubic ramus. No focal soft tissue abnormality. IMPRESSION: 1. Prominent bilateral hydronephrosis and hydroureter, new since prior study, without an obstructing stone or mass identified, likely representing reflux disease. 2. Bladder wall thickening with trabeculation, cellule formation, and bilateral diverticula, similar to prior study, most likely due to outlet obstruction or neurogenic bladder, although cystitis could also have this appearance. Electronically signed by: Elsie Gravely MD 07/27/2024 08:50 PM EST RP Workstation: [id number]    Microbiology: Results for orders placed or performed during the hospital encounter of 07/27/24  Urine Culture     Status: Abnormal   Collection Time: 07/28/24 12:40 AM   Specimen: Urine, Random  Result Value Ref Range Status   Specimen Description   Final    URINE, RANDOM Performed at [facility], [address] [place], [place] [address]    Special Requests   Final    NONE Performed at [facility], [address], [place], [place] [address]    Culture MULTIPLE SPECIES PRESENT, SUGGEST RECOLLECTION (A)  Final   Report Status 07/29/2024 FINAL  Final  Blood culture (routine x 2)     Status: None    Collection Time: 07/28/24  2:37 AM   Specimen: BLOOD LEFT HAND  Result Value Ref Range Status   Specimen Description   Final    BLOOD LEFT HAND Performed at [facility], [address] [place], [place] [address]    Special Requests   Final    BOTTLES DRAWN AEROBIC AND ANAEROBIC Blood Culture adequate volume Performed at [facility], [address], [place], [place] [address]    Culture   Final    NO GROWTH 5 DAYS Performed at [facility], [address], [place], [place] [address]    Report Status 08/02/2024 FINAL  Final  Blood culture (routine x 2)     Status: None   Collection Time: 07/28/24  2:37 AM   Specimen: BLOOD RIGHT FOREARM  Result Value Ref Range Status   Specimen Description   Final    BLOOD RIGHT FOREARM Performed at [facility], [address] [place],  [place] [address]    Special Requests   Final    BOTTLES DRAWN AEROBIC AND ANAEROBIC Blood Culture adequate volume Performed at [facility], [address] [place], [place] [address]    Culture   Final    NO GROWTH 5 DAYS Performed at [facility], [address], [place], [place] [address]    Report Status 08/02/2024 FINAL  Final    Labs: CBC: Recent Labs  Lab 07/27/24 2021 07/28/24 0415 07/29/24 0842 08/01/24 0601  WBC 8.8 10.4 7.2 5.1  NEUTROABS 5.9  --   --   --   HGB 11.1* 9.5* 9.9* 9.5*  HCT 34.1* 28.9* 31.3* 29.9*  MCV 93.9 94.4 95.7 94.9  PLT 222 200 158 208   Basic Metabolic Panel: Recent Labs  Lab 07/27/24 2021 07/28/24 0415 07/29/24 0842 08/01/24 0601  NA 132* 131* 132* 136  K 5.1 4.6 4.7 4.1  CL 99 100 103 105  CO2 21* 20* 17* 22  GLUCOSE 106* 102* 98 99  BUN 40* 37* 32* 24*  CREATININE 1.98* 1.72* 1.50* 1.02*  CALCIUM  9.0 8.2* 8.6* 8.4*  MG  --  1.8  --   --   PHOS  --  3.4  --   --    Liver Function Tests: Recent Labs  Lab 07/27/24 2021  AST 24  ALT 8  ALKPHOS 93  BILITOT 0.5  PROT  6.6  ALBUMIN 3.3*   CBG: No results for input(s): GLUCAP in the last 168 hours.  Discharge time spent: greater than 30 minutes.  Signed: Delon Herald, MD [facility] 08/02/2024 "

## 2024-08-02 NOTE — Assessment & Plan Note (Addendum)
 CT renal stone study revealed prominent bilateral hydronephrosis and hydroureter new since prior study Without an obstructing stone or mass identified, likely representing reflux disease vs. cystitis Discussed with urology, Ole Bourdon (who reviewed with Dr. Devere) Likely chronic in nature Marked improvement in renal function and resolution of hydronephrosis after foley placement Will leave foley in place and follow up outpatient with urology

## 2024-08-02 NOTE — TOC Progression Note (Addendum)
 Transition of Care [facility]) - Progression Note    Patient Details  Name: Ana Franco MRN: [id number] Date of Birth: 02-09-1926  [facility] CM/SW Contact  Toy LITTIE Agar, RN Phone [phone]  08/02/2024, 10:23 AM  Clinical Narrative:    CM has reached out to Kristie at [facility] to determine bed eligibility. Per this case managers last documentation [facility] does not have SNF bed available. CM has reached to to Kristie in admissions at [facility] to confirm.  Patient does have bed offers. CM to present bed offers. Insurance shara is still pending.   1000 CM at bedside to present facility choices. Patient is agreeable to   1124 Cm spoke with Bascom Fell admissions director at [facility], to determine if facility can still offer patient a bed.  1130 CM spoke with Kristie at [facility]. Roxie questions if patient can return to ALF with in house therapy 3 days per week. Message has been sent to MD and PT.    1400 Therapy recommendation has been changed. Patient can return to ALF.   1430 CM spoke with Kristen with admissions at [facility]. Patient has been cleared to return to ALF. FL2 has been updated.   Expected Discharge Plan: Skilled Nursing Facility Barriers to Discharge: No Barriers Identified, Continued Medical Work up               Expected Discharge Plan and Services In-house Referral: NA Discharge Planning Services: CM Consult Post Acute Care Choice: Skilled Nursing Facility Living arrangements for the past 2 months: Assisted Living Facility                 DME Arranged: N/A DME Agency: NA       HH Arranged: NA HH Agency: NA         Social Drivers of Health (SDOH) Interventions SDOH Screenings   Food Insecurity: No Food Insecurity (07/28/2024)  Housing: Low Risk (07/28/2024)  Transportation Needs: Unmet Transportation Needs (07/28/2024)  Utilities: Not At Risk (07/28/2024)  Social Connections: Moderately Integrated  (07/28/2024)  Recent Concern: Social Connections - Moderately Isolated (07/28/2024)  Tobacco Use: Low Risk (07/27/2024)    Readmission Risk Interventions    07/29/2024    2:41 PM 05/03/2024    9:44 AM  Readmission Risk Prevention Plan  Transportation Screening Complete Complete  PCP or Specialist Appt within 3-5 Days  Complete  HRI or Home Care Consult  Complete  Social Work Consult for Recovery Care Planning/Counseling  Complete  Palliative Care Screening  Not Applicable  Medication Review Oceanographer) Complete Complete  PCP or Specialist appointment within 3-5 days of discharge Complete   HRI or Home Care Consult Complete   SW Recovery Care/Counseling Consult Complete   Palliative Care Screening Not Applicable   Skilled Nursing Facility Complete

## 2024-08-02 NOTE — Assessment & Plan Note (Addendum)
 Baseline creatinine 1.4-1.6 with GFR 30-35 Presented with creatinine 1.98, GFR 23 Improved back to baseline, now even better after placement of foley Avoid nephrotoxic agents, dehydration, and hypotension Mild non-anion gap metabolic acidosis; added bicarb for 3 days and resolved

## 2024-08-02 NOTE — Assessment & Plan Note (Addendum)
 Resume metoprolol  Hold losartan  for now, as BP is controlled without (132/66 currently) BP goal is < 160 based on age

## 2024-08-02 NOTE — Assessment & Plan Note (Addendum)
 Presented with reported abdominal and flank pain No current symptoms UA is somewhat abnormal H/o ESBL UTI so placed on contact precautions Urine culture with multiple organisms, no specific organism Antibiotics were stopped

## 2024-08-02 NOTE — Assessment & Plan Note (Addendum)
 Stump appears well healed; traumatic injury from a fall led to amputation Consider 81 mg ASA daily if not resuming AC She does not have an obvious acute need for Lasix  so will hold

## 2024-08-02 NOTE — TOC Transition Note (Signed)
 Transition of Care [facility]) - Discharge Note   Patient Details  Name: Ana Franco MRN: [id number] Date of Birth: 12/28/1925  Transition [facility] Care [facility]) CM/SW Contact:  Ana LITTIE Agar, RN Phone [phone]  08/02/2024, 3:46 PM   Clinical Narrative:    Patient discharging back to [facility] with PT services. Transportation has been arranged per PTAR. Nurse has been given information for report. Son Ana Franco has been updated. Discharge packet is at nurses station. No other inpatient care manager needs.      Barriers to Discharge: No Barriers Identified, Continued Medical Work up   Patient Goals and CMS Choice Patient states their goals for this hospitalization and ongoing recovery are:: Wants to go back to ALF CMS Medicare.gov Compare Post Acute Care list provided to:: Patient Choice offered to / list presented to : Patient [place] ownership interest in [facility].provided to:: Patient    Discharge Placement                       Discharge Plan and Services Additional resources added to the After Visit Summary for   In-house Referral: NA Discharge Planning Services: CM Consult Post Acute Care Choice: Skilled Nursing Facility          DME Arranged: N/A DME Agency: NA       HH Arranged: NA HH Agency: NA        Social Drivers of Health (SDOH) Interventions SDOH Screenings   Food Insecurity: No Food Insecurity (07/28/2024)  Housing: Low Risk (07/28/2024)  Transportation Needs: Unmet Transportation Needs (07/28/2024)  Utilities: Not At Risk (07/28/2024)  Social Connections: Moderately Integrated (07/28/2024)  Recent Concern: Social Connections - Moderately Isolated (07/28/2024)  Tobacco Use: Low Risk (07/27/2024)     Readmission Risk Interventions    07/29/2024    2:41 PM 05/03/2024    9:44 AM  Readmission Risk Prevention Plan  Transportation Screening Complete Complete  PCP or Specialist Appt within 3-5 Days  Complete  HRI  or Home Care Consult  Complete  Social Work Consult for Recovery Care Planning/Counseling  Complete  Palliative Care Screening  Not Applicable  Medication Review Oceanographer) Complete Complete  PCP or Specialist appointment within 3-5 days of discharge Complete   HRI or Home Care Consult Complete   SW Recovery Care/Counseling Consult Complete   Palliative Care Screening Not Applicable   Skilled Nursing Facility Complete

## 2024-08-02 NOTE — [place] FL2 (Addendum)
 " [place]  MEDICAID FL2 LEVEL OF CARE FORM     IDENTIFICATION  Patient Name: Ana Franco Birthdate: [birth year] Sex: female Admission Date (Current Location): 07/27/2024  [facility] and [place] Number:  Producer, Television/film/video and Address:  [facility],  [address], [place] [address]      Provider Number: [id number]  Attending Physician Name and Address:  Barbarann Nest, MD  Relative Name and Phone Number:  Dayana Dalporto [phone]    Current Level of Care: Hospital Recommended Level of Care: Assisted Living Facility Prior Approval Number:    Date Approved/Denied:   PASRR Number: [id number]  Discharge Plan: Other (Comment) (Assisted living facility)    Current Diagnoses: Patient Active Problem List   Diagnosis Date Noted   Generalized weakness 07/29/2024   AKI (acute kidney injury) 07/28/2024   Bilateral hydronephrosis 07/28/2024   Closed pelvic ring fracture (HCC) 05/03/2024   Trauma 05/02/2024   Sepsis (HCC) 10/21/2023   Hematemesis 10/21/2023   Hyperkalemia 10/21/2023   Acute on chronic HFrEF (heart failure with reduced ejection fraction) (HCC) 10/12/2023   Acute CHF (congestive heart failure) (HCC) 10/12/2023   ARF (acute renal failure) 10/05/2023   SIRS (systemic inflammatory response syndrome) (HCC) 10/04/2023   Anemia of chronic disease 10/04/2023   COVID-19 virus antibody detected 10/04/2023   Acute kidney injury superimposed on stage 3a chronic kidney disease (HCC) 10/03/2023   Hypokalemia 12/15/2022   Acute diastolic CHF (congestive heart failure) (HCC) 12/14/2022   Hyponatremia 12/14/2022   Normocytic anemia 12/14/2022   Coronary artery disease 12/14/2022   Hx of BKA, left (HCC) 12/14/2022   Permanent atrial fibrillation (HCC) 12/18/2021   H/O: CVA (cerebrovascular accident) 12/18/2021   History of non-ST elevation myocardial infarction (NSTEMI) 12/18/2021   Acute CVA (cerebrovascular accident) (HCC) 01/07/2021   Thrombocytopenia  01/06/2021   TIA (transient ischemic attack) 01/06/2021   DNR (do not resuscitate) 01/06/2021   Chronic kidney disease CKD IIIb 01/06/2021   Paroxysmal atrial fibrillation (HCC) 05/04/2018   Gangrene of lower extremity (HCC)    Subacute osteomyelitis, left ankle and foot (HCC)    S/P ORIF (open reduction internal fixation) fracture 04/03/2018   Wound infection 04/02/2018   MRSA (methicillin resistant staph aureus) culture positive 04/02/2018   Acute blood loss anemia 04/02/2018   Closed left ankle fracture, with delayed healing, subsequent encounter 03/31/2018   Essential hypertension 06/23/2014   HLD (hyperlipidemia) 06/23/2014   GERD (gastroesophageal reflux disease) 06/23/2014   Abdominal pain 06/23/2014   Depression 06/23/2014    Orientation RESPIRATION BLADDER Height & Weight     Self, Time, Place, Situation  Normal Incontinent Weight: 74.1 kg Height:  5' 8 (172.7 cm)  BEHAVIORAL SYMPTOMS/MOOD NEUROLOGICAL BOWEL NUTRITION STATUS     (n/a) Continent Diet (Heart Healthy)  AMBULATORY STATUS COMMUNICATION OF NEEDS Skin   Limited Assist Verbally Other (Comment) (redness noted to sacrum)                       Personal Care Assistance Level of Assistance  Bathing, Feeding, Dressing, Total care Bathing Assistance: Limited assistance (1-2 assist) Feeding assistance: Independent (set up only) Dressing Assistance: Limited assistance Total Care Assistance:  (n/a)   Functional Limitations Info  Sight, Hearing, Speech Sight Info: Adequate Hearing Info: Adequate Speech Info: Adequate    SPECIAL CARE FACTORS FREQUENCY  PT (By licensed PT), OT (By licensed OT)     PT Frequency: 3x/wk OT Frequency: 3x/wk  Contractures Contractures Info: Not present    Additional Factors Info  Code Status, Allergies, Psychotropic, Insulin Sliding Scale, Isolation Precautions, Suctioning Needs Code Status Info: DNR Allergies Info: No known drug allergies Psychotropic Info:  see d/c summary Insulin Sliding Scale Info: see d/c summary Isolation Precautions Info: COntact precautions (ESBL, UTI) Suctioning Needs: n/a   Current Medications (08/02/2024):   Medication List       PAUSE taking these medications     Eliquis  5 MG Tabs tablet Wait to take this until your doctor or other care provider tells you to start again. Generic drug: apixaban  Take 5 mg by mouth 2 (two) times daily.    furosemide  20 MG tablet Wait to take this until your doctor or other care provider tells you to start again. Commonly known as: LASIX  Take 1 tablet (20 mg total) by mouth daily.    losartan  50 MG tablet Wait to take this until your doctor or other care provider tells you to start again. Commonly known as: COZAAR  Take 50 mg by mouth daily.    potassium chloride  SA 20 MEQ tablet Wait to take this until your doctor or other care provider tells you to start again. Commonly known as: KLOR-CON  M Take 20 mEq by mouth daily.           STOP taking these medications     amLODipine  5 MG tablet Commonly known as: NORVASC     nitrofurantoin (macrocrystal-monohydrate) 100 MG capsule Commonly known as: MACROBID    oxyCODONE  5 MG immediate release tablet Commonly known as: Oxy IR/ROXICODONE            TAKE these medications     acetaminophen  500 MG tablet Commonly known as: TYLENOL  Take 2 tablets (1,000 mg total) by mouth every 8 (eight) hours as needed.    benzonatate 200 MG capsule Commonly known as: TESSALON Take 200 mg by mouth 3 (three) times daily as needed for cough.    beta carotene 25000 UNIT capsule Take 25,000 Units by mouth daily.    Calcium +D3 500-10 MG-MCG Tabs Generic drug: Calcium  Carb-Cholecalciferol Take 1 tablet by mouth in the morning.    citalopram  20 MG tablet Commonly known as: CELEXA  Take 10 mg by mouth daily.    clotrimazole 1 % cream Commonly known as: LOTRIMIN Apply 1 Application topically 2 (two) times daily as needed (right  foot).    docusate sodium  100 MG capsule Commonly known as: COLACE Take 100 mg by mouth daily.    esomeprazole  40 MG capsule Commonly known as: NEXIUM  Take 1 capsule (40 mg total) by mouth 2 (two) times daily before a meal for 14 days, THEN 1 capsule (40 mg total) daily after breakfast. Start taking on: October 23, 2023 What changed: See the new instructions.    ezetimibe -simvastatin  10-20 MG tablet Commonly known as: VYTORIN  Take 1 tablet by mouth every evening.    ferrous sulfate  324 MG Tbec Take 324 mg by mouth in the morning.    gabapentin  100 MG capsule Commonly known as: NEURONTIN  Take 100 mg by mouth 3 (three) times daily.    guaiFENesin 100 MG/5ML liquid Commonly known as: ROBITUSSIN Take 5 mLs by mouth every 6 (six) hours as needed for cough or to loosen phlegm.    lidocaine  5 % Commonly known as: LIDODERM  Place 1 patch onto the skin daily. Remove & Discard patch within 12 hours or as directed by MD    metoprolol  tartrate 25 MG tablet Commonly known as: LOPRESSOR  Take 0.5 tablets (  12.5 mg total) by mouth 2 (two) times daily. Hold for SBP < 105 or HR < 60    ondansetron  4 MG disintegrating tablet Commonly known as: ZOFRAN -ODT Take 4 mg by mouth every 8 (eight) hours as needed for nausea.    oxymetazoline 0.05 % nasal spray Commonly known as: AFRIN Place 2 sprays into both nostrils 2 (two) times daily as needed (nose bleed).    polyethylene glycol 17 g packet Commonly known as: MIRALAX  / GLYCOLAX  Take 17 g by mouth daily as needed for mild constipation.    senna 8.6 MG Tabs tablet Commonly known as: SENOKOT Take 2 tablets by mouth daily.    vitamin B-12 500 MCG tablet Commonly known as: CYANOCOBALAMIN  Take 500 mcg by mouth every morning.            Discharge Medications: Please see discharge summary for a list of discharge medications.  Relevant Imaging Results:  Relevant Lab Results:   Additional Information SS #: [id number]  Toy LITTIE Agar,  RN     "

## 2024-08-02 NOTE — Assessment & Plan Note (Addendum)
 DNR confirmed at the time of admission Patient will need a gold out of facility DNR form at the time of discharge

## 2024-08-02 NOTE — Assessment & Plan Note (Addendum)
 Chronic, stable

## 2024-09-03 ENCOUNTER — Telehealth (INDEPENDENT_AMBULATORY_CARE_PROVIDER_SITE_OTHER): Payer: Self-pay | Admitting: Physician Assistant

## 2024-09-03 NOTE — Telephone Encounter (Signed)
 Left message to call back to reschedule 09/06/2024 appointment - office opening late due to weather

## 2024-09-06 ENCOUNTER — Ambulatory Visit (INDEPENDENT_AMBULATORY_CARE_PROVIDER_SITE_OTHER): Admitting: Physician Assistant

## 2024-11-25 ENCOUNTER — Ambulatory Visit (HOSPITAL_BASED_OUTPATIENT_CLINIC_OR_DEPARTMENT_OTHER): Admitting: Cardiology
